# Patient Record
Sex: Female | Born: 1940 | Race: Black or African American | Hispanic: No | Marital: Single | Smoking: Current every day smoker
Health system: Southern US, Community
[De-identification: ages and names within clinical notes are randomized; demographics above are authoritative.]

## PROBLEM LIST (undated history)

## (undated) DIAGNOSIS — E785 Hyperlipidemia, unspecified: Secondary | ICD-10-CM

## (undated) DIAGNOSIS — Z91148 Patient's other noncompliance with medication regimen for other reason: Secondary | ICD-10-CM

## (undated) DIAGNOSIS — I509 Heart failure, unspecified: Secondary | ICD-10-CM

## (undated) DIAGNOSIS — J90 Pleural effusion, not elsewhere classified: Secondary | ICD-10-CM

## (undated) DIAGNOSIS — E119 Type 2 diabetes mellitus without complications: Secondary | ICD-10-CM

## (undated) DIAGNOSIS — E559 Vitamin D deficiency, unspecified: Secondary | ICD-10-CM

## (undated) DIAGNOSIS — F17200 Nicotine dependence, unspecified, uncomplicated: Secondary | ICD-10-CM

## (undated) DIAGNOSIS — H269 Unspecified cataract: Secondary | ICD-10-CM

## (undated) DIAGNOSIS — J40 Bronchitis, not specified as acute or chronic: Secondary | ICD-10-CM

## (undated) DIAGNOSIS — Z86718 Personal history of other venous thrombosis and embolism: Secondary | ICD-10-CM

## (undated) DIAGNOSIS — Z9981 Dependence on supplemental oxygen: Secondary | ICD-10-CM

## (undated) DIAGNOSIS — D649 Anemia, unspecified: Secondary | ICD-10-CM

## (undated) DIAGNOSIS — I10 Essential (primary) hypertension: Secondary | ICD-10-CM

## (undated) DIAGNOSIS — J449 Chronic obstructive pulmonary disease, unspecified: Secondary | ICD-10-CM

## (undated) DIAGNOSIS — H5440 Blindness, one eye, unspecified eye: Secondary | ICD-10-CM

## (undated) DIAGNOSIS — R296 Repeated falls: Secondary | ICD-10-CM

## (undated) DIAGNOSIS — I5032 Chronic diastolic (congestive) heart failure: Secondary | ICD-10-CM

## (undated) DIAGNOSIS — I272 Pulmonary hypertension, unspecified: Secondary | ICD-10-CM

## (undated) HISTORY — DX: Blindness, one eye, unspecified eye: H54.40

## (undated) HISTORY — PX: ENUCLEATION: SHX628

## (undated) HISTORY — DX: Heart failure, unspecified: I50.9

---

## 2006-06-05 ENCOUNTER — Ambulatory Visit (HOSPITAL_COMMUNITY): Admission: RE | Admit: 2006-06-05 | Discharge: 2006-06-05 | Payer: Self-pay | Admitting: Family Medicine

## 2006-12-01 ENCOUNTER — Emergency Department (HOSPITAL_COMMUNITY): Admission: EM | Admit: 2006-12-01 | Discharge: 2006-12-01 | Payer: Self-pay | Admitting: Emergency Medicine

## 2007-06-09 ENCOUNTER — Ambulatory Visit (HOSPITAL_COMMUNITY): Admission: RE | Admit: 2007-06-09 | Discharge: 2007-06-09 | Payer: Self-pay | Admitting: Family Medicine

## 2007-09-29 ENCOUNTER — Emergency Department (HOSPITAL_COMMUNITY): Admission: EM | Admit: 2007-09-29 | Discharge: 2007-09-30 | Payer: Self-pay | Admitting: Emergency Medicine

## 2008-06-21 ENCOUNTER — Ambulatory Visit (HOSPITAL_COMMUNITY): Admission: RE | Admit: 2008-06-21 | Discharge: 2008-06-21 | Payer: Self-pay | Admitting: Family Medicine

## 2009-06-22 ENCOUNTER — Ambulatory Visit (HOSPITAL_COMMUNITY): Admission: RE | Admit: 2009-06-22 | Discharge: 2009-06-22 | Payer: Self-pay | Admitting: Family Medicine

## 2010-05-21 ENCOUNTER — Other Ambulatory Visit (HOSPITAL_COMMUNITY): Payer: Self-pay | Admitting: Family Medicine

## 2010-05-21 DIAGNOSIS — Z139 Encounter for screening, unspecified: Secondary | ICD-10-CM

## 2010-06-25 ENCOUNTER — Ambulatory Visit (HOSPITAL_COMMUNITY)
Admission: RE | Admit: 2010-06-25 | Discharge: 2010-06-25 | Disposition: A | Discharge status: Home / Self Care | Payer: PRIVATE HEALTH INSURANCE | Source: Ambulatory Visit | Attending: Family Medicine | Admitting: Family Medicine

## 2010-06-25 DIAGNOSIS — Z1231 Encounter for screening mammogram for malignant neoplasm of breast: Secondary | ICD-10-CM | POA: Insufficient documentation

## 2010-06-25 DIAGNOSIS — Z139 Encounter for screening, unspecified: Secondary | ICD-10-CM

## 2011-05-17 ENCOUNTER — Other Ambulatory Visit (HOSPITAL_COMMUNITY): Payer: Self-pay | Admitting: Family Medicine

## 2011-05-17 DIAGNOSIS — Z139 Encounter for screening, unspecified: Secondary | ICD-10-CM

## 2011-06-27 ENCOUNTER — Ambulatory Visit (HOSPITAL_COMMUNITY)
Admission: RE | Admit: 2011-06-27 | Discharge: 2011-06-27 | Disposition: A | Discharge status: Home / Self Care | Payer: PRIVATE HEALTH INSURANCE | Source: Ambulatory Visit | Attending: Family Medicine | Admitting: Family Medicine

## 2011-06-27 DIAGNOSIS — Z139 Encounter for screening, unspecified: Secondary | ICD-10-CM

## 2011-06-27 DIAGNOSIS — Z1231 Encounter for screening mammogram for malignant neoplasm of breast: Secondary | ICD-10-CM | POA: Insufficient documentation

## 2012-05-19 ENCOUNTER — Other Ambulatory Visit (HOSPITAL_COMMUNITY): Payer: Self-pay | Admitting: Family Medicine

## 2012-05-19 DIAGNOSIS — Z139 Encounter for screening, unspecified: Secondary | ICD-10-CM

## 2012-06-29 ENCOUNTER — Ambulatory Visit (HOSPITAL_COMMUNITY)
Admission: RE | Admit: 2012-06-29 | Discharge: 2012-06-29 | Disposition: A | Discharge status: Home / Self Care | Payer: PRIVATE HEALTH INSURANCE | Source: Ambulatory Visit | Attending: Family Medicine | Admitting: Family Medicine

## 2012-06-29 DIAGNOSIS — Z139 Encounter for screening, unspecified: Secondary | ICD-10-CM

## 2012-06-29 DIAGNOSIS — Z1231 Encounter for screening mammogram for malignant neoplasm of breast: Secondary | ICD-10-CM | POA: Insufficient documentation

## 2012-06-30 ENCOUNTER — Other Ambulatory Visit: Payer: Self-pay | Admitting: Family Medicine

## 2012-06-30 DIAGNOSIS — R928 Other abnormal and inconclusive findings on diagnostic imaging of breast: Secondary | ICD-10-CM

## 2012-07-07 ENCOUNTER — Encounter (HOSPITAL_COMMUNITY): Payer: Self-pay

## 2012-07-07 ENCOUNTER — Emergency Department (HOSPITAL_COMMUNITY)
Admission: EM | Admit: 2012-07-07 | Discharge: 2012-07-07 | Disposition: A | Discharge status: Home / Self Care | Payer: PRIVATE HEALTH INSURANCE | Attending: Emergency Medicine | Admitting: Emergency Medicine

## 2012-07-07 ENCOUNTER — Emergency Department (HOSPITAL_COMMUNITY): Payer: PRIVATE HEALTH INSURANCE

## 2012-07-07 DIAGNOSIS — Y9389 Activity, other specified: Secondary | ICD-10-CM | POA: Insufficient documentation

## 2012-07-07 DIAGNOSIS — S42253A Displaced fracture of greater tuberosity of unspecified humerus, initial encounter for closed fracture: Secondary | ICD-10-CM | POA: Insufficient documentation

## 2012-07-07 DIAGNOSIS — W010XXA Fall on same level from slipping, tripping and stumbling without subsequent striking against object, initial encounter: Secondary | ICD-10-CM | POA: Insufficient documentation

## 2012-07-07 DIAGNOSIS — S42251A Displaced fracture of greater tuberosity of right humerus, initial encounter for closed fracture: Secondary | ICD-10-CM

## 2012-07-07 DIAGNOSIS — I10 Essential (primary) hypertension: Secondary | ICD-10-CM | POA: Insufficient documentation

## 2012-07-07 DIAGNOSIS — Z7982 Long term (current) use of aspirin: Secondary | ICD-10-CM | POA: Insufficient documentation

## 2012-07-07 DIAGNOSIS — Z880 Allergy status to penicillin: Secondary | ICD-10-CM | POA: Insufficient documentation

## 2012-07-07 DIAGNOSIS — Z79899 Other long term (current) drug therapy: Secondary | ICD-10-CM | POA: Insufficient documentation

## 2012-07-07 DIAGNOSIS — F17200 Nicotine dependence, unspecified, uncomplicated: Secondary | ICD-10-CM | POA: Insufficient documentation

## 2012-07-07 DIAGNOSIS — Y929 Unspecified place or not applicable: Secondary | ICD-10-CM | POA: Insufficient documentation

## 2012-07-07 HISTORY — DX: Essential (primary) hypertension: I10

## 2012-07-07 MED ORDER — TRAMADOL HCL 50 MG PO TABS: 50.0000 mg | ORAL_TABLET | Freq: Four times a day (QID) | ORAL | Status: DC | PRN

## 2012-07-07 NOTE — ED Notes (Signed)
Pt reports tripped over her ottoman at home and fell hurting r shoulder.  C/O severe pain with movement.

## 2012-07-07 NOTE — ED Provider Notes (Signed)
History    This chart was scribed for [employer]. Alvino Chapel, MD by Roxan Diesel, ED scribe.  This patient was seen in room APA18/APA18 and the patient's care was started at 4:32 PM.   CSN: [id number]  Arrival date & time 07/07/12  1558     Chief Complaint  Patient presents with  . Shoulder Pain     The history is provided by the patient. No language interpreter was used.    HPI Comments: Marisa Bailey is a 72 y.o. female who presents to the Emergency Department complaining of a fall that occurred several hours ago, with subsequent right shoulder pain. Pt states that she tripped over her ottoman and landed on her right arm.  She denies head impact or LOC.  She states that pain is greatly exacerbated by movement. She denies injury or pain to any other areas.  She states she is not on blood-thinners.   Past Medical History  Diagnosis Date  . Hypertension     History reviewed. No pertinent past surgical history.  No family history on file.  History  Substance Use Topics  . Smoking status: Current Every Day Smoker  . Smokeless tobacco: Not on file  . Alcohol Use: No    OB History   Grav Para Term Preterm Abortions TAB SAB Ect Mult Living                  Review of Systems  Constitutional: Negative for fever and chills.  Respiratory: Negative for cough and shortness of breath.   Cardiovascular: Negative for chest pain.  Gastrointestinal: Negative for nausea, vomiting, abdominal pain and diarrhea.  Genitourinary: Negative for dysuria, frequency, hematuria and difficulty urinating.  Skin: Negative for rash.  Neurological: Negative for dizziness, weakness, light-headedness and numbness.  Psychiatric/Behavioral: Negative for behavioral problems and confusion.    Allergies  Penicillins  Home Medications   Current Outpatient Rx  Name  Route  Sig  Dispense  Refill  . amLODipine (NORVASC) 10 MG tablet   Oral   Take 10 mg by mouth daily.         Marland Kitchen aspirin EC 81  MG tablet   Oral   Take 81 mg by mouth daily.         Marland Kitchen olmesartan (BENICAR) 40 MG tablet   Oral   Take 40 mg by mouth daily.         . traMADol (ULTRAM) 50 MG tablet   Oral   Take 1 tablet (50 mg total) by mouth every 6 (six) hours as needed for pain.   15 tablet   0     BP 148/59  Pulse 68  Temp(Src) 98.4 F (36.9 C) (Oral)  Resp 20  Ht _0  (1.6 m)  Wt 168 lb (76.204 kg)  BMI 29.77 kg/m2  SpO2 99%  Physical Exam  Nursing note and vitals reviewed. Constitutional: She appears well-developed and well-nourished. No distress.  HENT:  Head: Normocephalic and atraumatic.  Eyes: Conjunctivae are normal.  Neck: Normal range of motion. Neck supple.  Cardiovascular: Normal rate and regular rhythm.   No murmur heard. Pulmonary/Chest: Effort normal and breath sounds normal. No respiratory distress. She has no wheezes. She has no rales.  Musculoskeletal: She exhibits tenderness.  Discrete decreased ROM to right shoulder Tendernses laterally to right shoulder Sensation intact to radial, ulnar and median nerve Strong radial pulse  Neurological: She is alert. Coordination normal.  Skin: Skin is warm and dry. No rash noted.  Psychiatric: She has a normal mood and affect. Her behavior is normal.    ED Course  Procedures (including critical care time)   COORDINATION OF CARE: 4:35 PM-Discussed treatment plan which includes imaging with pt at bedside and pt agreed to plan.   No results found for this or any previous visit. Dg Shoulder Right  07/07/2012   *RADIOLOGY REPORT*  Clinical Data: Right shoulder pain  RIGHT SHOULDER - 2+ VIEW  Comparison: None.  Findings: Nondisplaced fracture of the greater tuberosity.  Mild degenerative changes of the acromioclavicular joint.  Visualized right lung is clear.  IMPRESSION: Nondisplaced fracture of the greater tuberosity.   Original Report Authenticated By: Julian Hy, M.D.   Dg Humerus Right  07/07/2012   *RADIOLOGY REPORT*   Clinical Data: Fall, arm pain  RIGHT HUMERUS - 2+ VIEW  Comparison: None.  Findings: Nondisplaced greater tuberosity fracture, better evaluated on shoulder radiographs.  No distal humeral fracture is seen.  Mild degenerative changes of the elbow.  No displaced elbow joint fat pads to suggest an elbow joint effusion.  IMPRESSION: Nondisplaced greater tuberosity fracture, better evaluated on shoulder radiographs.  No distal humeral fracture is seen.   Original Report Authenticated By: Julian Hy, M.D.   Mm Digital Screening  06/30/2012   *RADIOLOGY REPORT*  Clinical Data: Screening.  DIGITAL BILATERAL SCREENING MAMMOGRAM WITH CAD  Comparison:  06/21/2008  FINDINGS:  ACR Breast Density Category 1: The breast tissue is almost entirely fatty.  There is a possible mass in the left breast. Spot compression views and possibly sonography are recommended for further evaluation. The right breast is unremarkable.  Images were processed with CAD.  IMPRESSION: Possible mass, left breast.  Additional evaluation is indicated.  RECOMMENDATION: Diagnostic mammogram and possibly sonography of the left breast. (Code:FI-L-16M)  BI-RADS CATEGORY 0:  Incomplete.  Need additional imaging evaluation and/or prior mammograms for comparison.   Original Report Authenticated By: Ulyess Blossom, M.D.        1. Greater tuberosity of humerus fracture, right, closed, initial encounter       MDM  Patient with fall. Fracture of greater tuberosity of right humerus. Discussed with Dr. Luna Glasgow. Will see in office tomorrow.      I personally performed the services described in this documentation, which was scribed in my presence. The recorded information has been reviewed and is accurate.     Jasper Riling. Alvino Chapel, MD 07/07/12 [phone]

## 2012-07-15 ENCOUNTER — Ambulatory Visit (HOSPITAL_COMMUNITY)
Admission: RE | Admit: 2012-07-15 | Discharge: 2012-07-15 | Disposition: A | Discharge status: Home / Self Care | Payer: PRIVATE HEALTH INSURANCE | Source: Ambulatory Visit | Attending: Family Medicine | Admitting: Family Medicine

## 2012-07-15 ENCOUNTER — Other Ambulatory Visit: Payer: Self-pay | Admitting: Family Medicine

## 2012-07-15 DIAGNOSIS — R928 Other abnormal and inconclusive findings on diagnostic imaging of breast: Secondary | ICD-10-CM

## 2012-08-17 ENCOUNTER — Ambulatory Visit (HOSPITAL_COMMUNITY)
Admission: RE | Admit: 2012-08-17 | Discharge: 2012-08-17 | Disposition: A | Discharge status: Home / Self Care | Payer: PRIVATE HEALTH INSURANCE | Source: Ambulatory Visit | Attending: Orthopaedic Surgery | Admitting: Orthopaedic Surgery

## 2012-08-17 DIAGNOSIS — S42209A Unspecified fracture of upper end of unspecified humerus, initial encounter for closed fracture: Secondary | ICD-10-CM

## 2012-08-17 DIAGNOSIS — I10 Essential (primary) hypertension: Secondary | ICD-10-CM | POA: Insufficient documentation

## 2012-08-17 DIAGNOSIS — M6281 Muscle weakness (generalized): Secondary | ICD-10-CM | POA: Insufficient documentation

## 2012-08-17 DIAGNOSIS — M25519 Pain in unspecified shoulder: Secondary | ICD-10-CM | POA: Insufficient documentation

## 2012-08-17 DIAGNOSIS — IMO0001 Reserved for inherently not codable concepts without codable children: Secondary | ICD-10-CM | POA: Insufficient documentation

## 2012-08-17 HISTORY — DX: Unspecified fracture of upper end of unspecified humerus, initial encounter for closed fracture: S42.209A

## 2012-08-17 NOTE — Evaluation (Addendum)
Occupational Therapy Evaluation  Patient Details  Name: Marisa Bailey MRN: [id number] Date of Birth: 1940/03/05  Today's Date: 08/17/2012 Time: 0998-3382 OT Time Calculation (min): 42 min OT eval 1103-1145 42'  Visit#: 1 of 12  Re-eval: 09/14/12  Assessment Diagnosis: Rt. proximal humeral fx Next MD Visit: 3 weeks - [place]  Authorization: Medicaid  Authorization Time Period:    Authorization Visit#: 1 of     Past Medical History:  Past Medical History  Diagnosis Date  . Hypertension    Past Surgical History: No past surgical history on file.  Subjective Symptoms/Limitations Symptoms: S: I was trying to get a stink bug off the floor and I tripped and fell.  Pertinent History: Marisa Bailey fell at home on July 19, 2012 and sustained a right proximal humerus fx. Dr. Luna Glasgow has referred patient to occupational therapy for evaluation and treatment.  Special Tests: UEFI score 63/80 78.75% independence 21.25% impaired Patient Stated Goals: To get rid of the pain and to use right arm as normal. Pain Assessment Currently in Pain?: Yes Pain Score: 7  Pain Location: Shoulder Pain Orientation: Right Pain Type: Acute pain Pain Relieving Factors: pain meds  Precautions/Restrictions  Precautions Precautions: Shoulder  Balance Screening Balance Screen Has the patient fallen in the past 6 months: Yes How many times?: 1 Has the patient had a decrease in activity level because of a fear of falling? : No Is the patient reluctant to leave their home because of a fear of falling? : No  Prior Functioning  Home Living Family/patient expects to be discharged to:: Private residence Living Arrangements: Alone Prior Function Level of Independence: Independent with basic ADLs;Independent with gait Driving: Yes Vocation: Retired Leisure: Hobbies-yes (Comment) Comments: travel, read, watch tv  Assessment ADL/Vision/Perception ADL ADL Comments: reaching up high, getting  shirt on/off,  housework, Dominant Hand: Right  Cognition/Observation Cognition Overall Cognitive Status: Within Functional Limits for tasks assessed Arousal/Alertness: Awake/alert Orientation Level: Oriented X4   Additional Assessments RUE Assessment RUE Assessment:  (assessed supine. IR/ER ABD) RUE AROM (degrees) Right Shoulder Flexion: 110 Degrees Right Shoulder ABduction: 99 Degrees Right Shoulder Internal Rotation: 81 Degrees Right Shoulder External Rotation: 65 Degrees RUE Strength Right Shoulder Flexion: 2+/5 Right Shoulder ABduction: 2+/5 Right Shoulder Internal Rotation: 3/5 Right Shoulder External Rotation: 2+/5 Palpation Palpation: min fascial restrictions in upper arm, trapezius, and scapularis.      Occupational Therapy Assessment and Plan OT Assessment and Plan Clinical Impression Statement: A: 72 y/o woman presents with R proximal humerus fracture secondary to a fall. Patient has decreased A/PROM and strength and increased pain and fascial restrictions causing decreased I with all B/IADLs and leisure activities Pt will benefit from skilled therapeutic intervention in order to improve on the following deficits: Decreased strength;Pain;Impaired UE functional use;Decreased range of motion;Increased fascial restricitons Rehab Potential: Excellent OT Frequency: Min 2X/week OT Duration: 8 weeks OT Treatment/Interventions: Self-care/ADL training;Therapeutic exercise;Manual therapy;Patient/family education;Therapeutic activities;Modalities OT Plan: P: Skilled OT intervention 2x per week to decrease pain and restrictions and increase A/PROM and strength needed to return to PLOF. MFR and manual stretching; supine PROM; ball stretches; seated elev,ext,row; prot/ret/elev As PROM improves, add AAROM in supine and seated positions with dowel and pulleys    Goals Short Term Goals Time to Complete Short Term Goals: 4 weeks Short Term Goal 1: Patient will be educated on HEP. Short Term Goal  2: Patient will improve R shoulder PROM to [facility] in order to increase independence with dressing.  Short Term Goal 3: Patient will  decrease pain to 4/10 when performing household tasks.  Short Term Goal 4: Patient will increase R shoulder strength to 3/5 in order to increase ease with donning shirt. Short Term Goal 5: Patient will decrease fascial restrictions from minimal to trace in right shoulder and upper arm regions.  Long Term Goals Time to Complete Long Term Goals: 8 weeks Long Term Goal 1: Patient will return to higher level of independence with all leisure and daily tasks. Long Term Goal 2: Patient will improve R shoulder AROM to WNL to increase independence with reaching in overhead kitchen cabinets.  Long Term Goal 3: Patient will decrease pain to 2/10 when performing household tasks.  Long Term Goal 4: Patient will improve R shoulder strength to 4/5 in order to increase independence with household chores such as vacuuming and mopping.   Problem List Patient Active Problem List   Diagnosis Date Noted  . Muscle weakness (generalized) 08/17/2012  . Pain in joint, shoulder region 08/17/2012  . Fx upper humerus-closed 08/17/2012    End of Session Activity Tolerance: Patient tolerated treatment well General Behavior During Therapy: [facility] for tasks assessed/performed OT Plan of Care OT Home Exercise Plan: towel slides OT Patient Instructions: handout - scanned Consulted and Agree with Plan of Care: Patient    28-Aug-2012 1337  OT G-codes  Functional Assessment Tool Used UEFI score 63/80 78.75% independence 21.25% impaired  Functional Limitation Carrying, moving and handling objects  Carrying, Moving and Handling Objects Current Status (E2800) CJ  Carrying, Moving and Handling Objects Goal Status (L4917) [place], OTR/L,CBIS   08/17/2012, 12:16 PM  Physician Documentation Your signature is required to indicate approval of the treatment plan as stated above.  Please  sign and either send electronically or make a copy of this report for your files and return this physician signed original.  Please mark one 1.__approve of plan  2. ___approve of plan with the following conditions.   ______________________________                                                          _____________________ Physician Signature                                                                                                             Date

## 2012-08-20 ENCOUNTER — Ambulatory Visit (HOSPITAL_COMMUNITY)
Admission: RE | Admit: 2012-08-20 | Discharge: 2012-08-20 | Disposition: A | Discharge status: Home / Self Care | Payer: PRIVATE HEALTH INSURANCE | Source: Ambulatory Visit | Attending: Orthopaedic Surgery | Admitting: Orthopaedic Surgery

## 2012-08-20 NOTE — Progress Notes (Signed)
Occupational Therapy Treatment Patient Details  Name: Marisa Bailey MRN: [id number] Date of Birth: 1940/12/10  Today's Date: 08/20/2012 Time: 0930-1005 OT Time Calculation (min): 35 min MFR 930-947 17' Therex [phone] 18'  Visit#: 2 of 12  Re-eval: 09/14/12    Authorization: Medicaid  Authorization Time Period:    Authorization Visit#: 2 of    Subjective Symptoms/Limitations Symptoms: S: I did the exercises you gave me this morning.  Pain Assessment Currently in Pain?: No/denies  Precautions/Restrictions  Precautions Precautions: Shoulder  Exercise/Treatments Supine Protraction: PROM;10 reps Horizontal ABduction: PROM;10 reps External Rotation: PROM;10 reps Internal Rotation: PROM;10 reps Flexion: PROM;10 reps ABduction: PROM;10 reps Seated Elevation: AROM;10 reps Extension: AROM;10 reps Row: AROM;10 reps Therapy Ball Flexion: 10 reps ABduction: 10 reps    Manual Therapy Manual Therapy: Myofascial release Myofascial Release: MFR and manual stretching to right upper arm, trapezius, and scapularis to decrease fascial restrictions and increase joint mobilty in a pain free zone.   Occupational Therapy Assessment and Plan OT Assessment and Plan Clinical Impression Statement: A: Patient informed that Medicaid would not cover the remaining amount from therapy. patient was given Inez Catalina Ratliff's number. Patient tolerated all exercises. Slight pain at end stretch during manual stretching. OT Plan: P: Increase reps of therapy ball. Work on increasing PROM to [facility] without pain.   Goals Short Term Goals Time to Complete Short Term Goals: 4 weeks Short Term Goal 1: Patient will be educated on HEP. Short Term Goal 1 Progress: Progressing toward goal Short Term Goal 2: Patient will improve R shoulder PROM to [facility] in order to increase independence with dressing.  Short Term Goal 2 Progress: Progressing toward goal Short Term Goal 3: Patient will decrease pain to 4/10 when  performing household tasks.  Short Term Goal 3 Progress: Progressing toward goal Short Term Goal 4: Patient will increase R shoulder strength to 3/5 in order to increase ease with donning shirt. Short Term Goal 4 Progress: Progressing toward goal Short Term Goal 5: Patient will decrease fascial restrictions from minimal to trace in right shoulder and upper arm regions.  Short Term Goal 5 Progress: Progressing toward goal Long Term Goals Time to Complete Long Term Goals: 8 weeks Long Term Goal 1: Patient will return to higher level of independence with all leisure and daily tasks. Long Term Goal 1 Progress: Progressing toward goal Long Term Goal 2: Patient will improve R shoulder AROM to WNL to increase independence with reaching in overhead kitchen cabinets.  Long Term Goal 2 Progress: Progressing toward goal Long Term Goal 3: Patient will decrease pain to 2/10 when performing household tasks.  Long Term Goal 3 Progress: Progressing toward goal Long Term Goal 4: Patient will improve R shoulder strength to 4/5 in order to increase independence with household chores such as vacuuming and mopping.  Long Term Goal 4 Progress: Progressing toward goal  Problem List Patient Active Problem List   Diagnosis Date Noted  . Muscle weakness (generalized) 08/17/2012  . Pain in joint, shoulder region 08/17/2012  . Fx upper humerus-closed 08/17/2012    End of Session Activity Tolerance: Patient tolerated treatment well General Behavior During Therapy: [facility] for tasks assessed/performed   Ailene Ravel, OTR/L,CBIS   08/20/2012, 10:11 AM

## 2012-08-25 ENCOUNTER — Ambulatory Visit (HOSPITAL_COMMUNITY)
Admission: RE | Admit: 2012-08-25 | Discharge: 2012-08-25 | Disposition: A | Discharge status: Home / Self Care | Payer: PRIVATE HEALTH INSURANCE | Source: Ambulatory Visit | Attending: Family Medicine | Admitting: Family Medicine

## 2012-08-25 DIAGNOSIS — S42201D Unspecified fracture of upper end of right humerus, subsequent encounter for fracture with routine healing: Secondary | ICD-10-CM

## 2012-08-25 DIAGNOSIS — M6281 Muscle weakness (generalized): Secondary | ICD-10-CM

## 2012-08-25 DIAGNOSIS — M25511 Pain in right shoulder: Secondary | ICD-10-CM

## 2012-08-25 NOTE — Progress Notes (Signed)
Occupational Therapy Treatment Patient Details  Name: Marisa Bailey MRN: [id number] Date of Birth: [birth year]  Today's Date: 08/25/2012 Time: 7829-5621 OT Time Calculation (min): 32 min Manual Therapy [phone] 13' Therapeutic exercises 846-962 19' Visit#: 3 of 12  Re-eval: 09/14/12    Authorization: [employer] Medicare Primary, Medicaid secondary, Medicaid covers eval only, patient notified of this and signed self payment form.   Authorization Time Period: before 10th visit  Authorization Visit#: 3 of 10  Subjective Symptoms/Limitations Symptoms: S:  What can I put on it when it hurts?"  Recommended heat, or rub with icey hot.  Limitations: Progress as tolerated, AROM ok after 08/31/12 Pain Assessment Currently in Pain?: No/denies Pain Score: 0-No pain  Precautions/Restrictions     Exercise/Treatments Supine Protraction: PROM;10 reps Horizontal ABduction: PROM;10 reps External Rotation: PROM;10 reps Internal Rotation: PROM;10 reps Flexion: PROM;10 reps ABduction: PROM;10 reps Seated Elevation: AROM;15 reps Extension: AROM;15 reps Row: AROM;15 reps Pulleys Flexion: 1 minute ABduction: 1 minute Therapy Ball Flexion: 20 reps ABduction: 20 reps Isometric Strengthening Thumb Tacks: 1' Prot/Ret//Elev/Dep: 1' Flexion: Supine;3X3" Extension: Supine;3X3" External Rotation: Supine;3X3" Internal Rotation: Supine;3X3" ABduction: Supine;3X3" ADduction: Supine;3X3"     Manual Therapy Manual Therapy: Myofascial release Myofascial Release: MFR and manual stretching to right shoulder region, upper arm, trapezius, and scapularis to decrease fascial restrictions and increase joint mobilty in a pain free zone.   Occupational Therapy Assessment and Plan OT Assessment and Plan Clinical Impression Statement: A:  PROM to approximately 75% flexion and ER, abduction limited to 50%.  Added scapular stabilization exercises (prot/ret//elev/dep and thumbtacks), and pulleys for increased  PROM. OT Plan: P:  Add dowel rod exercises for flexion, prot, horizontal abd, and ER/IR.    Goals Short Term Goals Time to Complete Short Term Goals: 4 weeks Short Term Goal 1: Patient will be educated on HEP. Short Term Goal 1 Progress: Progressing toward goal Short Term Goal 2: Patient will improve R shoulder PROM to [facility] in order to increase independence with dressing.  Short Term Goal 2 Progress: Progressing toward goal Short Term Goal 3: Patient will decrease pain to 4/10 when performing household tasks.  Short Term Goal 3 Progress: Progressing toward goal Short Term Goal 4: Patient will increase R shoulder strength to 3/5 in order to increase ease with donning shirt. Short Term Goal 4 Progress: Progressing toward goal Short Term Goal 5: Patient will decrease fascial restrictions from minimal to trace in right shoulder and upper arm regions.  Short Term Goal 5 Progress: Progressing toward goal Long Term Goals Time to Complete Long Term Goals: 8 weeks Long Term Goal 1: Patient will return to higher level of independence with all leisure and daily tasks. Long Term Goal 1 Progress: Progressing toward goal Long Term Goal 2: Patient will improve R shoulder AROM to WNL to increase independence with reaching in overhead kitchen cabinets.  Long Term Goal 2 Progress: Progressing toward goal Long Term Goal 3: Patient will decrease pain to 2/10 when performing household tasks.  Long Term Goal 3 Progress: Progressing toward goal Long Term Goal 4: Patient will improve R shoulder strength to 4/5 in order to increase independence with household chores such as vacuuming and mopping.  Long Term Goal 4 Progress: Progressing toward goal  Problem List Patient Active Problem List   Diagnosis Date Noted  . Muscle weakness (generalized) 08/17/2012  . Pain in joint, shoulder region 08/17/2012  . Fx upper humerus-closed 08/17/2012    End of Session Activity Tolerance: Patient tolerated treatment  well  General Behavior During Therapy: [facility] for tasks assessed/performed  Vangie Bicker, OTR/L  08/25/2012, 9:35 AM

## 2012-08-27 ENCOUNTER — Ambulatory Visit (HOSPITAL_COMMUNITY): Payer: PRIVATE HEALTH INSURANCE | Admitting: Specialist

## 2012-09-01 ENCOUNTER — Ambulatory Visit (HOSPITAL_COMMUNITY): Payer: PRIVATE HEALTH INSURANCE

## 2012-09-03 ENCOUNTER — Ambulatory Visit (HOSPITAL_COMMUNITY)
Admission: RE | Admit: 2012-09-03 | Discharge: 2012-09-03 | Disposition: A | Discharge status: Home / Self Care | Payer: PRIVATE HEALTH INSURANCE | Source: Ambulatory Visit | Attending: Family Medicine | Admitting: Family Medicine

## 2012-09-03 NOTE — Progress Notes (Signed)
Occupational Therapy Treatment Patient Details  Name: Marisa Bailey MRN: [id number] Date of Birth: 1940-05-02  Today's Date: 09/03/2012 Time: 1448-1856 OT Time Calculation (min): 42 min Manual Therapy [phone] 16' Therapeutic Exercise 904-930 26' Visit#: 4 of 12  Re-eval: 09/14/12    Authorization: [employer] Medicare Primary, Medicaid secondary, Medicaid covers eval only, patient notified of this and signed self payment form.   Authorization Time Period: before 10th visit  Authorization Visit#: 4 of 10  Subjective  S:  The fan blowing on it makes it hurt.   Limitations: Progress as tolerated, AROM ok after 08/31/12 Pain Assessment Currently in Pain?: Yes Pain Score: 3  Pain Location: Shoulder Pain Orientation: Right Pain Type: Acute pain  Precautions/Restrictions   progress as tolerated  Exercise/Treatments Supine Protraction: PROM;AAROM;10 reps Horizontal ABduction: PROM;AAROM;10 reps External Rotation: PROM;AAROM;10 reps Internal Rotation: PROM;AAROM;10 reps Flexion: PROM;AAROM;10 reps ABduction: PROM;AAROM;10 reps Seated Elevation: AROM;15 reps Extension: AROM;15 reps Retraction: AROM;15 reps Row: AROM;15 reps Pulleys Flexion:  (omit this visit) ABduction:  (omit this visit) Therapy Ball Flexion: 25 reps ABduction: 25 reps Right/Left: 5 reps ROM / Strengthening / Isometric Strengthening Wall Wash: 1' Thumb Tacks: 1' Prot/Ret//Elev/Dep: 1' Flexion:  (dc isometric strengthening)    Manual Therapy Manual Therapy: Myofascial release Myofascial Release: MFR and manual stretching to right shoulder region, upper arm, trapezius, and scapularis to decrease fascial restrictions and increase joint mobilty in a pain free zone.  Occupational Therapy Assessment and Plan OT Assessment and Plan Clinical Impression Statement: A:  Added AAROM exercises in supine, as PROM was [facility].  Added ball circles and wall wash exercises.  OT Plan: P: Increase repetitions with dowel rod  exercises and add dowel rod exercises in seated.    Goals Short Term Goals Time to Complete Short Term Goals: 4 weeks Short Term Goal 1: Patient will be educated on HEP. Short Term Goal 1 Progress: Progressing toward goal Short Term Goal 2: Patient will improve R shoulder PROM to [facility] in order to increase independence with dressing.  Short Term Goal 2 Progress: Progressing toward goal Short Term Goal 3: Patient will decrease pain to 4/10 when performing household tasks.  Short Term Goal 3 Progress: Progressing toward goal Short Term Goal 4: Patient will increase R shoulder strength to 3/5 in order to increase ease with donning shirt. Short Term Goal 4 Progress: Progressing toward goal Short Term Goal 5: Patient will decrease fascial restrictions from minimal to trace in right shoulder and upper arm regions.  Short Term Goal 5 Progress: Progressing toward goal Long Term Goals Time to Complete Long Term Goals: 8 weeks Long Term Goal 1: Patient will return to higher level of independence with all leisure and daily tasks. Long Term Goal 1 Progress: Progressing toward goal Long Term Goal 2: Patient will improve R shoulder AROM to WNL to increase independence with reaching in overhead kitchen cabinets.  Long Term Goal 2 Progress: Progressing toward goal Long Term Goal 3: Patient will decrease pain to 2/10 when performing household tasks.  Long Term Goal 3 Progress: Progressing toward goal Long Term Goal 4: Patient will improve R shoulder strength to 4/5 in order to increase independence with household chores such as vacuuming and mopping.  Long Term Goal 4 Progress: Progressing toward goal  Problem List Patient Active Problem List   Diagnosis Date Noted  . Muscle weakness (generalized) 08/17/2012  . Pain in joint, shoulder region 08/17/2012  . Fx upper humerus-closed 08/17/2012    End of Session Activity Tolerance: Patient  tolerated treatment well General Behavior During Therapy: [facility]  for tasks assessed/performed  [place], OTR/L  09/03/2012, 9:35 AM

## 2012-09-08 ENCOUNTER — Ambulatory Visit (HOSPITAL_COMMUNITY): Payer: PRIVATE HEALTH INSURANCE

## 2012-09-10 ENCOUNTER — Ambulatory Visit (HOSPITAL_COMMUNITY): Payer: PRIVATE HEALTH INSURANCE

## 2012-09-15 ENCOUNTER — Ambulatory Visit (HOSPITAL_COMMUNITY)
Admission: RE | Admit: 2012-09-15 | Discharge: 2012-09-15 | Disposition: A | Discharge status: Home / Self Care | Payer: PRIVATE HEALTH INSURANCE | Source: Ambulatory Visit | Attending: Orthopaedic Surgery | Admitting: Orthopaedic Surgery

## 2012-09-15 DIAGNOSIS — M25519 Pain in unspecified shoulder: Secondary | ICD-10-CM | POA: Insufficient documentation

## 2012-09-15 DIAGNOSIS — M6281 Muscle weakness (generalized): Secondary | ICD-10-CM | POA: Insufficient documentation

## 2012-09-15 DIAGNOSIS — I10 Essential (primary) hypertension: Secondary | ICD-10-CM | POA: Insufficient documentation

## 2012-09-15 DIAGNOSIS — IMO0001 Reserved for inherently not codable concepts without codable children: Secondary | ICD-10-CM | POA: Insufficient documentation

## 2012-09-15 NOTE — Progress Notes (Signed)
Occupational Therapy Treatment Patient Details  Name: Marisa Bailey MRN: [id number] Date of Birth: [birth year]  Today's Date: 09/15/2012 Time: 9201-0071 OT Time Calculation (min): 45 min Manual Therapy [phone] 22' Therapeutic exercises 905-928 23' ROM assessment  Visit#: 5 of 12  Re-eval: 10/13/12    Authorization: UHC Medicare Primary, Medicaid secondary, Medicaid covers eval only, patient notified of this and signed self payment form.   Authorization Time Period: before15th visit  Authorization Visit#: 5 of 15  Subjective S:  The icy hot makes it feel better. Limitations: Progress as tolerated, AROM ok after 08/31/12 Special Tests: UEFI score is 96% Independence level, was 78% at initial evaluation.  Pain Assessment Currently in Pain?: Yes Pain Score: 6  Pain Location: Shoulder Pain Orientation: Right Pain Type: Acute pain  Precautions/Restrictions    Progress as tolerated, AROM ok after 08/31/12  Exercise/Treatments Supine Protraction: PROM;10 reps;AAROM;12 reps Horizontal ABduction: PROM;10 reps;AAROM;12 reps External Rotation: PROM;10 reps;AAROM;12 reps Internal Rotation: PROM;10 reps;AAROM;12 reps Flexion: PROM;10 reps;AAROM;12 reps ABduction: PROM;10 reps;AAROM;12 reps Seated Elevation: AROM;15 reps Extension: AROM;15 reps Retraction: AROM;15 reps Row: AROM;15 reps Therapy Ball Flexion: 25 reps ABduction: 25 reps Right/Left: 5 reps ROM / Strengthening / Isometric Strengthening Wall Wash: 2' Thumb Tacks: 1' Prot/Ret//Elev/Dep: 1'      Manual Therapy Manual Therapy: Myofascial release Myofascial Release: MFR and manual stretching to right shoulder region, upper arm, trapezius, and scapularis to decrease fascial restrictions and increase joint mobilty in a pain free zone.  Occupational Therapy Assessment and Plan OT Assessment and Plan Clinical Impression Statement: A: Reassessment completed this date: supine A/PROM current (initial evaluation) flexion  155/165 (110), abduction 140/170 (99), external rotation with shoulder adducted 35/60 (65), internal rotation with shoulder adducted 80 (81). Strength assessed in seated and is 3+/5 throughout shoulder.  OT Frequency: Min 2X/week OT Duration: 4 weeks OT Plan: P:  Add AAROM in seated and theraband exercises for scapular stability.     Goals Short Term Goals Time to Complete Short Term Goals: 4 weeks Short Term Goal 1: Patient will be educated on HEP. Short Term Goal 1 Progress: Met Short Term Goal 2: Patient will improve R shoulder PROM to [facility] in order to increase independence with dressing.  Short Term Goal 2 Progress: Met Short Term Goal 3: Patient will decrease pain to 4/10 when performing household tasks.  Short Term Goal 3 Progress: Progressing toward goal Short Term Goal 4: Patient will increase R shoulder strength to 3/5 in order to increase ease with donning shirt. Short Term Goal 4 Progress: Met Short Term Goal 5: Patient will decrease fascial restrictions from minimal to trace in right shoulder and upper arm regions.  Short Term Goal 5 Progress: Progressing toward goal Long Term Goals Time to Complete Long Term Goals: 8 weeks Long Term Goal 1: Patient will return to higher level of independence with all leisure and daily tasks. Long Term Goal 1 Progress: Progressing toward goal Long Term Goal 2: Patient will improve R shoulder AROM to WNL to increase independence with reaching in overhead kitchen cabinets.  Long Term Goal 2 Progress: Progressing toward goal Long Term Goal 3: Patient will decrease pain to 2/10 when performing household tasks.  Long Term Goal 3 Progress: Progressing toward goal Long Term Goal 4: Patient will improve R shoulder strength to 4/5 in order to increase independence with household chores such as vacuuming and mopping.  Long Term Goal 4 Progress: Progressing toward goal  Problem List Patient Active Problem List   Diagnosis Date  Noted  . Muscle weakness  (generalized) 08/17/2012  . Pain in joint, shoulder region 08/17/2012  . Fx upper humerus-closed 08/17/2012    End of Session Activity Tolerance: Patient tolerated treatment well General Behavior During Therapy: [facility] for tasks assessed/performed  GO Functional Assessment Tool Used: UEFI was 78 and is currently  Functional Limitation: Carrying, moving and handling objects Carrying, Moving and Handling Objects Current Status ([id number]): At least 1 percent but less than 20 percent impaired, limited or restricted Carrying, Moving and Handling Objects Goal Status [phone]): At least 60 percent but less than 80 percent impaired, limited or restricted  Vangie Bicker, OTR/L  09/15/2012, 9:29 AM

## 2012-09-18 ENCOUNTER — Ambulatory Visit (HOSPITAL_COMMUNITY)
Admission: RE | Admit: 2012-09-18 | Discharge: 2012-09-18 | Disposition: A | Discharge status: Home / Self Care | Payer: PRIVATE HEALTH INSURANCE | Source: Ambulatory Visit | Attending: Orthopaedic Surgery | Admitting: Orthopaedic Surgery

## 2012-09-18 NOTE — Progress Notes (Signed)
Occupational Therapy Treatment Patient Details  Name: Marisa Bailey MRN: [id number] Date of Birth: Apr 10, 1940  Today's Date: 09/18/2012 Time: 1110-1140 OT Time Calculation (min): 30 min Manual Therapy 1110-1131 21' Therapeutic Exercises 1131-1140 9' Visit#: 6 of 12  Re-eval: 10/13/12    Authorization: UHC Medicare Primary, Medicaid secondary, Medicaid covers eval only, patient notified of this and signed self payment form.   Authorization Time Period: before 15th visit  Authorization Visit#: 6 of 15  Subjective S:  I have only one sore spot today. Limitations: Progress as tolerated, AROM ok after 08/31/12 Pain Assessment Currently in Pain?: Yes Pain Score: 7  Pain Location: Shoulder Pain Orientation: Right Pain Type: Acute pain  Precautions/Restrictions    Progress as tolerated, AROM ok after 08/31/12   Exercise/Treatments Supine Protraction: PROM;10 reps;AAROM;15 reps Horizontal ABduction: PROM;10 reps;AAROM;15 reps External Rotation: PROM;10 reps;AAROM;15 reps Internal Rotation: PROM;10 reps;AAROM;15 reps Flexion: PROM;10 reps;AAROM;15 reps ABduction: PROM;10 reps;AAROM;15 reps Seated Protraction: AAROM;10 reps Horizontal ABduction: AAROM;10 reps External Rotation: AAROM;10 reps Internal Rotation: AAROM;10 reps Flexion: AAROM;10 reps Abduction: AAROM;10 reps    Manual Therapy Manual Therapy: Myofascial release Myofascial Release: MFR and manual stretching to right shoulder region, upper arm, trapezius, and scapularis to decrease fascial restrictions and increase joint mobilty in a pain free zone.  Occupational Therapy Assessment and Plan OT Assessment and Plan Clinical Impression Statement: A:  Did not complete majority of therapeutic exercises, as patient requested to leave session early for another engagement.  Added AAROM in seated, required min pa and mod vg for technique with all seated AAROM.  OT Plan: P:  Resume all therapeutic exercises, complete AAROM in  seated with less vg and physical cuing.    Goals Short Term Goals Time to Complete Short Term Goals: 4 weeks Short Term Goal 1: Patient will be educated on HEP. Short Term Goal 2: Patient will improve R shoulder PROM to [facility] in order to increase independence with dressing.  Short Term Goal 3: Patient will decrease pain to 4/10 when performing household tasks.  Short Term Goal 4: Patient will increase R shoulder strength to 3/5 in order to increase ease with donning shirt. Short Term Goal 5: Patient will decrease fascial restrictions from minimal to trace in right shoulder and upper arm regions.  Long Term Goals Time to Complete Long Term Goals: 8 weeks Long Term Goal 1: Patient will return to higher level of independence with all leisure and daily tasks. Long Term Goal 2: Patient will improve R shoulder AROM to WNL to increase independence with reaching in overhead kitchen cabinets.  Long Term Goal 3: Patient will decrease pain to 2/10 when performing household tasks.  Long Term Goal 4: Patient will improve R shoulder strength to 4/5 in order to increase independence with household chores such as vacuuming and mopping.   Problem List Patient Active Problem List   Diagnosis Date Noted  . Muscle weakness (generalized) 08/17/2012  . Pain in joint, shoulder region 08/17/2012  . Fx upper humerus-closed 08/17/2012    End of Session Activity Tolerance: Patient tolerated treatment well General Behavior During Therapy: WFL for tasks assessed/performed OT Plan of Care OT Home Exercise Plan: educated on dowel rod exercises in supine/seated  [place], OTR/L  09/18/2012, 11:45 AM

## 2012-09-22 ENCOUNTER — Ambulatory Visit (HOSPITAL_COMMUNITY)
Admission: RE | Admit: 2012-09-22 | Discharge: 2012-09-22 | Disposition: A | Discharge status: Home / Self Care | Payer: PRIVATE HEALTH INSURANCE | Source: Ambulatory Visit | Attending: Family Medicine | Admitting: Family Medicine

## 2012-09-22 NOTE — Progress Notes (Signed)
Occupational Therapy Treatment Patient Details  Name: Marisa Bailey MRN: [id number] Date of Birth: January 24, 1941  Today's Date: 09/22/2012 Time: 3702-3017 OT Time Calculation (min): 42 min Manual Therapy 849-901 12' Therapeutic Exercises 901-931 30' Visit#: 7 of 12  Re-eval: 10/13/12    Authorization: UHC Medicare Primary, Medicaid secondary, Medicaid covers eval only, patient notified of this and signed self payment form.   Authorization Time Period: before 15th visit  Authorization Visit#: 7 of 15  Subjective  S:  I have one little spot that is sore, in the back of my shoulder.  Pain Assessment Currently in Pain?: Yes Pain Score: 6  Pain Location: Shoulder Pain Orientation: Right Pain Type: Acute pain  Precautions/Restrictions   progress as tolerated  Exercise/Treatments Supine Protraction: PROM;AROM;10 reps Horizontal ABduction: PROM;AROM;10 reps External Rotation: PROM;AROM;10 reps Internal Rotation: PROM;AROM;10 reps Flexion: PROM;AROM;10 reps ABduction: PROM;AROM;10 reps Seated Elevation: AROM;15 reps Extension: AROM;15 reps Retraction: AROM;15 reps Row: AROM;15 reps Protraction: AAROM;10 reps Horizontal ABduction: AAROM;10 reps External Rotation: AAROM;10 reps Internal Rotation: AAROM;10 reps Flexion: AAROM;10 reps Abduction: AAROM;10 reps Therapy Ball Flexion: 25 reps ABduction: 25 reps Right/Left: 5 reps ROM / Strengthening / Isometric Strengthening UBE (Upper Arm Bike): 2' and 2' at 1.0 resistance Wall Wash: 2'      Manual Therapy Manual Therapy: Myofascial release Myofascial Release: MFR and manual stretching to right shoulder region, upper arm, trapezius, and scapularis to decrease fascial restrictions and increase joint mobilty in a pain free zone.  Occupational Therapy Assessment and Plan OT Assessment and Plan Clinical Impression Statement: A:  Added AROM in supine and UBE this date.  Less tactile cuing required with exercises, however  continues to require vg to slow down and improve technique.  OT Plan: P:  Increase AROM repetitions in supine.  Less vg and increased independence with therapeutic exercises .   Goals Short Term Goals Time to Complete Short Term Goals: 4 weeks Short Term Goal 1: Patient will be educated on HEP. Short Term Goal 2: Patient will improve R shoulder PROM to [facility] in order to increase independence with dressing.  Short Term Goal 3: Patient will decrease pain to 4/10 when performing household tasks.  Short Term Goal 4: Patient will increase R shoulder strength to 3/5 in order to increase ease with donning shirt. Short Term Goal 5: Patient will decrease fascial restrictions from minimal to trace in right shoulder and upper arm regions.  Long Term Goals Time to Complete Long Term Goals: 8 weeks Long Term Goal 1: Patient will return to higher level of independence with all leisure and daily tasks. Long Term Goal 1 Progress: Progressing toward goal Long Term Goal 2: Patient will improve R shoulder AROM to WNL to increase independence with reaching in overhead kitchen cabinets.  Long Term Goal 2 Progress: Progressing toward goal Long Term Goal 3: Patient will decrease pain to 2/10 when performing household tasks.  Long Term Goal 3 Progress: Progressing toward goal Long Term Goal 4: Patient will improve R shoulder strength to 4/5 in order to increase independence with household chores such as vacuuming and mopping.  Long Term Goal 4 Progress: Progressing toward goal  Problem List Patient Active Problem List   Diagnosis Date Noted  . Muscle weakness (generalized) 08/17/2012  . Pain in joint, shoulder region 08/17/2012  . Fx upper humerus-closed 08/17/2012    End of Session Activity Tolerance: Patient tolerated treatment well General Behavior During Therapy: [facility] for tasks assessed/performed  Vangie Bicker, OTR/L   09/22/2012, 9:31  AM

## 2012-09-25 ENCOUNTER — Ambulatory Visit (HOSPITAL_COMMUNITY)
Admission: RE | Admit: 2012-09-25 | Discharge: 2012-09-25 | Disposition: A | Discharge status: Home / Self Care | Payer: PRIVATE HEALTH INSURANCE | Source: Ambulatory Visit | Attending: Family Medicine | Admitting: Family Medicine

## 2012-09-25 NOTE — Progress Notes (Signed)
Occupational Therapy Treatment Patient Details  Name: Marisa Bailey MRN: [id number] Date of Birth: 1940/08/11  Today's Date: 09/25/2012 Time: 0459-9774 OT Time Calculation (min): 38 min Manual Therapy 142-395 12' Therapeutic Exercises 904-930 26' Visit#: 8 of 12  Re-eval: 10/13/12    Authorization: UHC Medicare Primary, Medicaid secondary, Medicaid covers eval only, patient notified of this and signed self payment form.   Authorization Time Period: before 15th visit  Authorization Visit#: 8 of 15  Subjective S:  I think I felt better after doing that bike!  I like that.  Limitations: Progress as tolerated, AROM ok after 08/31/12 Pain Assessment Currently in Pain?: Yes Pain Score: 2  Pain Location: Shoulder Pain Orientation: Right  Precautions/Restrictions   progress as tolerated  Exercise/Treatments Supine Protraction: PROM;10 reps;AROM;12 reps Horizontal ABduction: PROM;10 reps;AROM;12 reps External Rotation: PROM;10 reps;AROM;12 reps Internal Rotation: PROM;10 reps;AROM;12 reps Flexion: PROM;10 reps;AROM;12 reps ABduction: 10 reps;PROM;AROM;12 reps Seated Protraction: AAROM;15 reps Horizontal ABduction: AAROM;15 reps External Rotation: AAROM;15 reps Internal Rotation: AAROM;15 reps Flexion: AAROM;15 reps Abduction: AAROM;15 reps Therapy Ball Flexion: 25 reps ABduction: 25 reps Right/Left: 5 reps ROM / Strengthening / Isometric Strengthening UBE (Upper Arm Bike): 3' and 3' 1.0 Wall Wash: 2' Proximal Shoulder Strengthening, Seated: 10X with hand over hand assistance from OTR/L for facilitation and technique      Manual Therapy Manual Therapy: Myofascial release Myofascial Release: MFR and manual stretching to right shoulder region, upper arm, trapezius, and scapularis to decrease fascial restrictions and increase joint mobilty in a pain free zone.  Occupational Therapy Assessment and Plan OT Assessment and Plan Clinical Impression Statement: A:  Added  proximal shoulder strengthening in supine, required facilitation for technique and for general range. OT Plan: P:  Complete proximal shoulder strengthening without facilitation.     Goals Short Term Goals Time to Complete Short Term Goals: 4 weeks Short Term Goal 1: Patient will be educated on HEP. Short Term Goal 2: Patient will improve R shoulder PROM to [facility] in order to increase independence with dressing.  Short Term Goal 3: Patient will decrease pain to 4/10 when performing household tasks.  Short Term Goal 4: Patient will increase R shoulder strength to 3/5 in order to increase ease with donning shirt. Short Term Goal 5: Patient will decrease fascial restrictions from minimal to trace in right shoulder and upper arm regions.  Short Term Goal 5 Progress: Progressing toward goal Long Term Goals Time to Complete Long Term Goals: 8 weeks Long Term Goal 1: Patient will return to higher level of independence with all leisure and daily tasks. Long Term Goal 1 Progress: Progressing toward goal Long Term Goal 2: Patient will improve R shoulder AROM to WNL to increase independence with reaching in overhead kitchen cabinets.  Long Term Goal 2 Progress: Progressing toward goal Long Term Goal 3: Patient will decrease pain to 2/10 when performing household tasks.  Long Term Goal 3 Progress: Progressing toward goal Long Term Goal 4: Patient will improve R shoulder strength to 4/5 in order to increase independence with household chores such as vacuuming and mopping.  Long Term Goal 4 Progress: Progressing toward goal  Problem List Patient Active Problem List   Diagnosis Date Noted  . Muscle weakness (generalized) 08/17/2012  . Pain in joint, shoulder region 08/17/2012  . Fx upper humerus-closed 08/17/2012    End of Session Activity Tolerance: Patient tolerated treatment well General Behavior During Therapy: [facility] for tasks assessed/performed  [place], OTR/L  09/25/2012,  9:24 AM

## 2012-09-28 ENCOUNTER — Ambulatory Visit (HOSPITAL_COMMUNITY)
Admission: RE | Admit: 2012-09-28 | Discharge: 2012-09-28 | Disposition: A | Discharge status: Home / Self Care | Payer: PRIVATE HEALTH INSURANCE | Source: Ambulatory Visit | Attending: Family Medicine | Admitting: Family Medicine

## 2012-09-28 NOTE — Progress Notes (Signed)
Occupational Therapy Treatment Patient Details  Name: Marisa Bailey MRN: [id number] Date of Birth: 1940/04/02  Today's Date: 09/28/2012 Time: 6712-4580 OT Time Calculation (min): 51 min Manual Therapy 845-910 25' Therapeutic Exercises 910-936 26' Visit#: 9 of 12  Re-eval: 10/13/12    Authorization: UHC Medicare Primary, Medicaid secondary, Medicaid covers eval only, patient notified of this and signed self payment form.   Authorization Time Period: before 15th visit  Authorization Visit#: 9 of 15  Subjective S:  Its hurting today. Limitations: Progress as tolerated, AROM ok after 08/31/12 Pain Assessment Currently in Pain?: Yes Pain Score: 6  Pain Location: Shoulder Pain Orientation: Right  Precautions/Restrictions   progress as tolerated  Exercise/Treatments Supine Protraction: PROM;10 reps;AROM;12 reps Horizontal ABduction: PROM;10 reps;AROM;12 reps External Rotation: PROM;10 reps;AROM;12 reps Internal Rotation: PROM;10 reps;AROM;12 reps Flexion: PROM;10 reps;AROM;12 reps ABduction: 10 reps;PROM;AROM;12 reps Seated Protraction: AAROM;15 reps Horizontal ABduction: AAROM;15 reps External Rotation: AAROM;15 reps Internal Rotation: AAROM;15 reps Flexion: AAROM;15 reps Abduction: AAROM;15 reps Standing Extension: Theraband;10 reps Theraband Level (Shoulder Extension): Level 2 (Red) Row: Theraband;10 reps Theraband Level (Shoulder Row): Level 2 (Red) Retraction: Theraband;10 reps Theraband Level (Shoulder Retraction): Level 2 (Red)  Therapy Ball Flexion: 25 reps ABduction: 25 reps Right/Left: 5 reps ROM / Strengthening / Isometric Strengthening UBE (Upper Arm Bike): unavailable Wall Wash: 3' Other ROM/Strengthening Exercises: nustep X 5 minutes      Manual Therapy Manual Therapy: Myofascial release Myofascial Release: MFR and manual stretching to right shoulder region, upper arm, trapezius, and scapularis to decrease fascial restrictions and increase joint  mobilty in a pain free zone.  Occupational Therapy Assessment and Plan OT Assessment and Plan Clinical Impression Statement: A:  Added theraband for scapular stability.  Added Nustep. OT Plan: P:  Attempt AROM in seated for increased functional use of RUE.   Goals Short Term Goals Time to Complete Short Term Goals: 4 weeks Short Term Goal 1: Patient will be educated on HEP. Short Term Goal 2: Patient will improve R shoulder PROM to [facility] in order to increase independence with dressing.  Short Term Goal 3: Patient will decrease pain to 4/10 when performing household tasks.  Short Term Goal 3 Progress: Progressing toward goal Short Term Goal 4: Patient will increase R shoulder strength to 3/5 in order to increase ease with donning shirt. Short Term Goal 5: Patient will decrease fascial restrictions from minimal to trace in right shoulder and upper arm regions.  Short Term Goal 5 Progress: Progressing toward goal Long Term Goals Time to Complete Long Term Goals: 8 weeks Long Term Goal 1: Patient will return to higher level of independence with all leisure and daily tasks. Long Term Goal 1 Progress: Progressing toward goal Long Term Goal 2: Patient will improve R shoulder AROM to WNL to increase independence with reaching in overhead kitchen cabinets.  Long Term Goal 2 Progress: Progressing toward goal Long Term Goal 3: Patient will decrease pain to 2/10 when performing household tasks.  Long Term Goal 3 Progress: Progressing toward goal Long Term Goal 4: Patient will improve R shoulder strength to 4/5 in order to increase independence with household chores such as vacuuming and mopping.  Long Term Goal 4 Progress: Progressing toward goal  Problem List Patient Active Problem List   Diagnosis Date Noted  . Muscle weakness (generalized) 08/17/2012  . Pain in joint, shoulder region 08/17/2012  . Fx upper humerus-closed 08/17/2012    End of Session Activity Tolerance: Patient tolerated  treatment well General Behavior During Therapy: [facility]  for tasks assessed/performed  [place], OTR/L  09/28/2012, 9:32 AM

## 2012-09-29 ENCOUNTER — Ambulatory Visit (HOSPITAL_COMMUNITY): Payer: PRIVATE HEALTH INSURANCE | Admitting: Specialist

## 2012-09-30 ENCOUNTER — Ambulatory Visit (HOSPITAL_COMMUNITY)
Admission: RE | Admit: 2012-09-30 | Discharge: 2012-09-30 | Disposition: A | Discharge status: Home / Self Care | Payer: PRIVATE HEALTH INSURANCE | Source: Ambulatory Visit | Attending: Family Medicine | Admitting: Family Medicine

## 2012-09-30 NOTE — Progress Notes (Signed)
Occupational Therapy Treatment Patient Details  Name: Marisa Bailey MRN: [id number] Date of Birth: August 11, 1940  Today's Date: 09/30/2012 Time: 0867-6195 OT Time Calculation (min): 47 min MFR 093-267 10' Therex 124-580 37'  Visit#: 10 of 12  Re-eval: 10/13/12    Authorization: UHC Medicare Primary, Medicaid secondary, Medicaid covers eval only, patient notified of this and signed self payment form.   Authorization Time Period: before 15th visit  Authorization Visit#: 10 of 15  Subjective Symptoms/Limitations Symptoms: S: My arm is hurting just a little today.  Pain Assessment Currently in Pain?: Yes Pain Score: 5  Pain Location: Shoulder Pain Orientation: Right Pain Type: Acute pain  Precautions/Restrictions  Precautions Precautions: Shoulder  Exercise/Treatments Supine Protraction: PROM;10 reps;AROM;12 reps Horizontal ABduction: PROM;10 reps;AROM;12 reps External Rotation: PROM;10 reps;AROM;12 reps Internal Rotation: PROM;10 reps;AROM;12 reps Flexion: PROM;10 reps;AROM;12 reps ABduction: 10 reps;PROM;AROM;12 reps Seated Protraction: AROM;10 reps Horizontal ABduction: AROM;10 reps External Rotation: AROM;10 reps Internal Rotation: AROM;10 reps Flexion: AROM;10 reps Abduction: AROM;10 reps Therapy Ball Flexion: 25 reps ABduction: 25 reps ROM / Strengthening / Isometric Strengthening UBE (Upper Arm Bike): 1.0 3' forward 3' reverse Proximal Shoulder Strengthening, Supine: 10X Proximal Shoulder Strengthening, Seated: 10X       Manual Therapy Manual Therapy: Myofascial release Myofascial Release: MFR and manual stretching to right shoulder region, upper arm, trapezius, and scapularis to decrease fascial restrictions and increase joint mobilty in a pain free zone  Occupational Therapy Assessment and Plan OT Assessment and Plan Clinical Impression Statement: A: Added AROM seated. Patient required verbal cues for form and technique with all AROM exercises supine  and seated.  OT Plan: P: Cont. to work on increasing AROM and PROM to [facility] for decreased difficulty completing reaching activities.   Goals Short Term Goals Time to Complete Short Term Goals: 4 weeks Short Term Goal 1: Patient will be educated on HEP. Short Term Goal 2: Patient will improve R shoulder PROM to [facility] in order to increase independence with dressing.  Short Term Goal 3: Patient will decrease pain to 4/10 when performing household tasks.  Short Term Goal 4: Patient will increase R shoulder strength to 3/5 in order to increase ease with donning shirt. Short Term Goal 5: Patient will decrease fascial restrictions from minimal to trace in right shoulder and upper arm regions.  Long Term Goals Time to Complete Long Term Goals: 8 weeks Long Term Goal 1: Patient will return to higher level of independence with all leisure and daily tasks. Long Term Goal 2: Patient will improve R shoulder AROM to WNL to increase independence with reaching in overhead kitchen cabinets.  Long Term Goal 3: Patient will decrease pain to 2/10 when performing household tasks.  Long Term Goal 4: Patient will improve R shoulder strength to 4/5 in order to increase independence with household chores such as vacuuming and mopping.   Problem List Patient Active Problem List   Diagnosis Date Noted  . Muscle weakness (generalized) 08/17/2012  . Pain in joint, shoulder region 08/17/2012  . Fx upper humerus-closed 08/17/2012    End of Session Activity Tolerance: Patient tolerated treatment well General Behavior During Therapy: [facility] for tasks assessed/performed   Ailene Ravel, OTR/L,CBIS   09/30/2012, 9:17 AM

## 2012-10-02 ENCOUNTER — Ambulatory Visit (HOSPITAL_COMMUNITY): Payer: PRIVATE HEALTH INSURANCE | Admitting: Specialist

## 2012-10-07 ENCOUNTER — Ambulatory Visit (HOSPITAL_COMMUNITY)
Admission: RE | Admit: 2012-10-07 | Discharge: 2012-10-07 | Disposition: A | Discharge status: Home / Self Care | Payer: PRIVATE HEALTH INSURANCE | Source: Ambulatory Visit | Attending: Family Medicine | Admitting: Family Medicine

## 2012-10-07 NOTE — Progress Notes (Signed)
Occupational Therapy Treatment Patient Details  Name: Marisa Bailey MRN: [id number] Date of Birth: [birth year]  Today's Date: 10/07/2012 Time: 1638-4665 OT Time Calculation (min): 50 min MFR 840-851  11' Therex 993-570  39'  Visit#: 11 of 12  Re-eval: 10/13/12    Authorization: UHC Medicare Primary, Medicaid secondary, Medicaid covers eval only, patient notified of this and signed self payment form.   Authorization Time Period: before 15th visit  Authorization Visit#: 11 of 15  Subjective Symptoms/Limitations Symptoms: S: I have good days and bad days with this arm.  Pain Assessment Currently in Pain?: Yes Pain Score: 3  Pain Location: Shoulder Pain Orientation: Right Pain Type: Acute pain  Precautions/Restrictions  Precautions Precautions: Shoulder  Exercise/Treatments Supine Protraction: PROM;Strengthening;Weights;10 reps Protraction Weight (lbs): 1 Horizontal ABduction: PROM;Strengthening;Weights;10 reps Horizontal ABduction Weight (lbs): 1 External Rotation: PROM;Strengthening;Weights;10 reps External Rotation Weight (lbs): 1 Internal Rotation: PROM;Strengthening;Weights;10 reps Internal Rotation Weight (lbs): 1 Flexion: PROM;Strengthening;Weights;10 reps Shoulder Flexion Weight (lbs): 1 ABduction: PROM;10 reps;AROM;12 reps Seated Protraction: AROM;12 reps Horizontal ABduction: AROM;12 reps External Rotation: AROM;12 reps Internal Rotation: AROM;12 reps Flexion: AROM;12 reps Abduction: AROM;12 reps ROM / Strengthening / Isometric Strengthening UBE (Upper Arm Bike): 1.5 3' reverse 3' forward Wall Wash: 3' "W" Arms: 10X X to V Arms: 10X Proximal Shoulder Strengthening, Supine: 10X Proximal Shoulder Strengthening, Seated: 10X       Manual Therapy Manual Therapy: Myofascial release Myofascial Release: MFR and manual stretching to right shoulder region, upper arm, trapezius, and scapularis to decrease fascial restrictions and increase joint mobilty in a  pain free zone  Occupational Therapy Assessment and Plan OT Assessment and Plan Clinical Impression Statement: A: Addded 1# supine exercises with the exception of Abduction due to pain with AROM. Increased resistance on UBE bike to 1.5. Patient tolerated well.  OT Plan: P: Reassess. Work on being able to complete all supine stretngthening exercises with 1#.   Goals Short Term Goals Time to Complete Short Term Goals: 4 weeks Short Term Goal 1: Patient will be educated on HEP. Short Term Goal 2: Patient will improve R shoulder PROM to [facility] in order to increase independence with dressing.  Short Term Goal 3: Patient will decrease pain to 4/10 when performing household tasks.  Short Term Goal 3 Progress: Progressing toward goal Short Term Goal 4: Patient will increase R shoulder strength to 3/5 in order to increase ease with donning shirt. Short Term Goal 5: Patient will decrease fascial restrictions from minimal to trace in right shoulder and upper arm regions.  Short Term Goal 5 Progress: Progressing toward goal Long Term Goals Time to Complete Long Term Goals: 8 weeks Long Term Goal 1: Patient will return to higher level of independence with all leisure and daily tasks. Long Term Goal 1 Progress: Progressing toward goal Long Term Goal 2: Patient will improve R shoulder AROM to WNL to increase independence with reaching in overhead kitchen cabinets.  Long Term Goal 2 Progress: Progressing toward goal Long Term Goal 3: Patient will decrease pain to 2/10 when performing household tasks.  Long Term Goal 3 Progress: Progressing toward goal Long Term Goal 4: Patient will improve R shoulder strength to 4/5 in order to increase independence with household chores such as vacuuming and mopping.  Long Term Goal 4 Progress: Progressing toward goal  Problem List Patient Active Problem List   Diagnosis Date Noted  . Muscle weakness (generalized) 08/17/2012  . Pain in joint, shoulder region 08/17/2012   . Fx upper humerus-closed 08/17/2012  End of Session Activity Tolerance: Patient tolerated treatment well General Behavior During Therapy: [facility] for tasks assessed/performed   Ailene Ravel, OTR/L,CBIS   10/07/2012, 9:33 AM

## 2012-10-14 ENCOUNTER — Ambulatory Visit (HOSPITAL_COMMUNITY): Payer: PRIVATE HEALTH INSURANCE | Admitting: Specialist

## 2012-10-20 ENCOUNTER — Ambulatory Visit (HOSPITAL_COMMUNITY)
Admission: RE | Admit: 2012-10-20 | Discharge: 2012-10-20 | Disposition: A | Discharge status: Home / Self Care | Payer: PRIVATE HEALTH INSURANCE | Source: Ambulatory Visit | Attending: Orthopaedic Surgery | Admitting: Orthopaedic Surgery

## 2012-10-20 DIAGNOSIS — M6281 Muscle weakness (generalized): Secondary | ICD-10-CM | POA: Insufficient documentation

## 2012-10-20 DIAGNOSIS — M25519 Pain in unspecified shoulder: Secondary | ICD-10-CM | POA: Insufficient documentation

## 2012-10-20 DIAGNOSIS — IMO0001 Reserved for inherently not codable concepts without codable children: Secondary | ICD-10-CM | POA: Insufficient documentation

## 2012-10-20 DIAGNOSIS — I10 Essential (primary) hypertension: Secondary | ICD-10-CM | POA: Insufficient documentation

## 2012-10-20 NOTE — Progress Notes (Signed)
Occupational Therapy Treatment Patient Details  Name: Marisa Bailey MRN: [id number] Date of Birth: [birth year]  Today's Date: 10/20/2012 Time: 0076-2263 OT Time Calculation (min): 38 min Manual therapy 940-949 9' ROM/MMT [phone] 11' Therapeutic Ex 1000-1018 18' Visit#: 12 of 16  Re-eval: 11/17/12    Authorization: UHC Medicare Primary, Medicaid secondary, Medicaid covers eval only, patient notified of this and signed self payment form.   Authorization Time Period: before 22 visit  Authorization Visit#: 12 of 22  Subjective  S:  I want to raise it up just lke I do my left one.   Pain Assessment Currently in Pain?: Yes Pain Score: 1  Pain Location: Shoulder Pain Orientation: Right Pain Type: Acute pain  Precautions/Restrictions   progress as tolerated  Exercise/Treatments Supine  PROM x 10 strengthening with 1# x 10 Seated  AROM x 10 Wall wash and ube              Manual Therapy Manual Therapy: Myofascial release Myofascial Release: MFR and manual stretching to right shoulder region, upper arm, trapezius, and scapularis to decrease fascial restrictions and increase joint mobilty in a pain free zone  Occupational Therapy Assessment and Plan OT Assessment and Plan Clinical Impression Statement: A: Reassessment completed this date: seated AROM and strength (supine AROM and strength) flexion 115 4-/5 (155 3+/5), abduction 110 4-/5 (140 3+/5), external rotation with shoulder adducted 50 4/5 (35 3+/5), internal rotation with shoulder adducted 90 4/5 (80 3+/5). She feels she is able to complete activities at waist to shoulder height, and reaching overhead and lifting heavier items is very difficult. OT Plan: P:  Improve independence with functional activities at shoulder height and above.     Goals Short Term Goals Time to Complete Short Term Goals: 4 weeks Short Term Goal 1: Patient will be educated on HEP. Short Term Goal 2: Patient will improve R shoulder PROM to  [facility] in order to increase independence with dressing.  Short Term Goal 3: Patient will decrease pain to 4/10 when performing household tasks.  Short Term Goal 3 Progress: Met Short Term Goal 4: Patient will increase R shoulder strength to 3/5 in order to increase ease with donning shirt. Short Term Goal 5: Patient will decrease fascial restrictions from minimal to trace in right shoulder and upper arm regions.  Short Term Goal 5 Progress: Progressing toward goal Long Term Goals Time to Complete Long Term Goals: 8 weeks Long Term Goal 1: Patient will return to higher level of independence with all leisure and daily tasks. Long Term Goal 1 Progress: Progressing toward goal Long Term Goal 2: Patient will improve R shoulder AROM to WNL to increase independence with reaching in overhead kitchen cabinets.  Long Term Goal 2 Progress: Progressing toward goal Long Term Goal 3: Patient will decrease pain to 2/10 when performing household tasks.  Long Term Goal 3 Progress: Met Long Term Goal 4: Patient will improve R shoulder strength to 4/5 in order to increase independence with household chores such as vacuuming and mopping.  Long Term Goal 4 Progress: Progressing toward goal  Problem List Patient Active Problem List   Diagnosis Date Noted  . Muscle weakness (generalized) 08/17/2012  . Pain in joint, shoulder region 08/17/2012  . Fx upper humerus-closed 08/17/2012    End of Session Activity Tolerance: Patient tolerated treatment well General Behavior During Therapy: WFL for tasks assessed/performed  GO Functional Assessment Tool Used: UEFI was 78 and is currently 96 - based on clincial observation also, patient is  25% limited. Functional Limitation: Carrying, moving and handling objects Carrying, Moving and Handling Objects Current Status [phone]): At least 20 percent but less than 40 percent impaired, limited or restricted Carrying, Moving and Handling Objects Goal Status [phone]): At least 1  percent but less than 20 percent impaired, limited or restricted  Vangie Bicker, OTR/L  10/20/2012, 1:23 PM

## 2012-10-27 ENCOUNTER — Ambulatory Visit (HOSPITAL_COMMUNITY): Payer: PRIVATE HEALTH INSURANCE

## 2012-11-03 ENCOUNTER — Inpatient Hospital Stay (HOSPITAL_COMMUNITY): Admission: RE | Admit: 2012-11-03 | Payer: PRIVATE HEALTH INSURANCE | Source: Ambulatory Visit

## 2012-11-06 ENCOUNTER — Telehealth (HOSPITAL_COMMUNITY): Payer: Self-pay

## 2012-11-06 NOTE — Telephone Encounter (Signed)
Called patient about missed appointment on Tuesday 11/03/12. Patient stated that at her doctor visit Monday, with Dr. Luna Glasgow, he stated that she not longer needed to attend therapy. Patient will be discharged from therapy services.   Ailene Ravel, OTR/L,CBIS  11/06/12 11:50AM

## 2012-12-09 ENCOUNTER — Other Ambulatory Visit (HOSPITAL_COMMUNITY): Payer: Self-pay | Admitting: Family Medicine

## 2012-12-09 DIAGNOSIS — Z09 Encounter for follow-up examination after completed treatment for conditions other than malignant neoplasm: Secondary | ICD-10-CM

## 2013-01-20 ENCOUNTER — Ambulatory Visit (HOSPITAL_COMMUNITY)
Admission: RE | Admit: 2013-01-20 | Discharge: 2013-01-20 | Disposition: A | Discharge status: Home / Self Care | Payer: PRIVATE HEALTH INSURANCE | Source: Ambulatory Visit | Attending: Family Medicine | Admitting: Family Medicine

## 2013-01-20 ENCOUNTER — Other Ambulatory Visit (HOSPITAL_COMMUNITY): Payer: Self-pay | Admitting: Family Medicine

## 2013-01-20 DIAGNOSIS — Z09 Encounter for follow-up examination after completed treatment for conditions other than malignant neoplasm: Secondary | ICD-10-CM

## 2013-01-20 DIAGNOSIS — R928 Other abnormal and inconclusive findings on diagnostic imaging of breast: Secondary | ICD-10-CM | POA: Insufficient documentation

## 2013-06-25 ENCOUNTER — Other Ambulatory Visit (HOSPITAL_COMMUNITY): Payer: Self-pay | Admitting: Family Medicine

## 2013-06-25 DIAGNOSIS — R928 Other abnormal and inconclusive findings on diagnostic imaging of breast: Secondary | ICD-10-CM

## 2013-07-28 ENCOUNTER — Ambulatory Visit (HOSPITAL_COMMUNITY)
Admission: RE | Admit: 2013-07-28 | Discharge: 2013-07-28 | Disposition: A | Discharge status: Home / Self Care | Payer: PRIVATE HEALTH INSURANCE | Source: Ambulatory Visit | Attending: Family Medicine | Admitting: Family Medicine

## 2013-07-28 DIAGNOSIS — R928 Other abnormal and inconclusive findings on diagnostic imaging of breast: Secondary | ICD-10-CM

## 2013-07-28 DIAGNOSIS — R922 Inconclusive mammogram: Secondary | ICD-10-CM | POA: Diagnosis present

## 2013-09-04 ENCOUNTER — Emergency Department (HOSPITAL_COMMUNITY): Payer: PRIVATE HEALTH INSURANCE

## 2013-09-04 ENCOUNTER — Encounter (HOSPITAL_COMMUNITY): Payer: Self-pay | Admitting: Emergency Medicine

## 2013-09-04 ENCOUNTER — Emergency Department (HOSPITAL_COMMUNITY)
Admission: EM | Admit: 2013-09-04 | Discharge: 2013-09-04 | Disposition: A | Discharge status: Home / Self Care | Payer: PRIVATE HEALTH INSURANCE | Attending: Emergency Medicine | Admitting: Emergency Medicine

## 2013-09-04 DIAGNOSIS — Z8709 Personal history of other diseases of the respiratory system: Secondary | ICD-10-CM | POA: Diagnosis not present

## 2013-09-04 DIAGNOSIS — R05 Cough: Secondary | ICD-10-CM

## 2013-09-04 DIAGNOSIS — Z7982 Long term (current) use of aspirin: Secondary | ICD-10-CM | POA: Insufficient documentation

## 2013-09-04 DIAGNOSIS — F17200 Nicotine dependence, unspecified, uncomplicated: Secondary | ICD-10-CM | POA: Insufficient documentation

## 2013-09-04 DIAGNOSIS — Z79899 Other long term (current) drug therapy: Secondary | ICD-10-CM | POA: Diagnosis not present

## 2013-09-04 DIAGNOSIS — Z880 Allergy status to penicillin: Secondary | ICD-10-CM | POA: Diagnosis not present

## 2013-09-04 DIAGNOSIS — I10 Essential (primary) hypertension: Secondary | ICD-10-CM | POA: Diagnosis not present

## 2013-09-04 DIAGNOSIS — R059 Cough, unspecified: Secondary | ICD-10-CM | POA: Insufficient documentation

## 2013-09-04 DIAGNOSIS — Z792 Long term (current) use of antibiotics: Secondary | ICD-10-CM | POA: Insufficient documentation

## 2013-09-04 HISTORY — DX: Bronchitis, not specified as acute or chronic: J40

## 2013-09-04 LAB — CBC WITH DIFFERENTIAL/PLATELET
Basophils Absolute: 0 K/uL (ref 0.0–0.1)
Basophils Relative: 0 % (ref 0–1)
Eosinophils Absolute: 0.6 K/uL (ref 0.0–0.7)
Eosinophils Relative: 8 % — ABNORMAL HIGH (ref 0–5)
HCT: 38.9 % (ref 36.0–46.0)
Hemoglobin: 12.9 g/dL (ref 12.0–15.0)
Lymphocytes Relative: 25 % (ref 12–46)
Lymphs Abs: 1.9 K/uL (ref 0.7–4.0)
MCH: 28.7 pg (ref 26.0–34.0)
MCHC: 33.2 g/dL (ref 30.0–36.0)
MCV: 86.4 fL (ref 78.0–100.0)
Monocytes Absolute: 0.4 K/uL (ref 0.1–1.0)
Monocytes Relative: 6 % (ref 3–12)
Neutro Abs: 4.6 K/uL (ref 1.7–7.7)
Neutrophils Relative %: 61 % (ref 43–77)
Platelets: 246 K/uL (ref 150–400)
RBC: 4.5 MIL/uL (ref 3.87–5.11)
RDW: 13.9 % (ref 11.5–15.5)
WBC: 7.5 K/uL (ref 4.0–10.5)

## 2013-09-04 LAB — BASIC METABOLIC PANEL
Anion gap: 10 (ref 5–15)
BUN: 15 mg/dL (ref 6–23)
CO2: 29 mEq/L (ref 19–32)
Calcium: 8.9 mg/dL (ref 8.4–10.5)
Chloride: 103 mEq/L (ref 96–112)
Creatinine, Ser: 0.65 mg/dL (ref 0.50–1.10)
GFR calc Af Amer: >90 mL/min (ref >90)
GFR calc non Af Amer: 87 mL/min — ABNORMAL LOW (ref >90)
Glucose, Bld: 91 mg/dL (ref 70–99)
Potassium: 3.6 mEq/L — ABNORMAL LOW (ref 3.7–5.3)
Sodium: 142 mEq/L (ref 137–147)

## 2013-09-04 LAB — PRO B NATRIURETIC PEPTIDE: Pro B Natriuretic peptide (BNP): 73.8 pg/mL (ref 0–125)

## 2013-09-04 LAB — TROPONIN I: Troponin I: <0.3 ng/mL

## 2013-09-04 MED ORDER — DOXYCYCLINE HYCLATE 100 MG PO TABS: 100.0000 mg | ORAL_TABLET | Freq: Two times a day (BID) | ORAL | Status: DC

## 2013-09-04 NOTE — Discharge Instructions (Signed)
Emergency Department Resource Guide 1) Find a Doctor and Pay Out of Pocket Although you won't have to find out who is covered by your insurance plan, it is a good idea to ask around and get recommendations. You will then need to call the office and see if the doctor you have chosen will accept you as a new patient and what types of options they offer for patients who are self-pay. Some doctors offer discounts or will set up payment plans for their patients who do not have insurance, but you will need to ask so you aren't surprised when you get to your appointment.  2) Contact Your Local Health Department Not all health departments have doctors that can see patients for sick visits, but many do, so it is worth a call to see if yours does. If you don't know where your local health department is, you can check in your phone book. The CDC also has a tool to help you locate your state's health department, and many state websites also have listings of all of their local health departments.  3) Find a [facility] If your illness is not likely to be very severe or complicated, you may want to try a walk in clinic. These are popping up all over the country in pharmacies, drugstores, and shopping centers. They're usually staffed by nurse practitioners or physician assistants that have been trained to treat common illnesses and complaints. They're usually fairly quick and inexpensive. However, if you have serious medical issues or chronic medical problems, these are probably not your best option.  No Primary Care Doctor: - Call Health Connect at  [phone] - they can help you locate a primary care doctor that  accepts your insurance, provides certain services, etc. - Physician Referral Service- [phone]  Chronic Pain Problems: Organization         Address  Phone   Notes  [facility]  [phone] Patients need to be referred by their primary care doctor.   Medication  Assistance: Organization         Address  Phone   Notes  [facility] Medication [facility] [address] [place], [place] [address] [phone] --Must be a resident of [place] [facility] -- Must have NO insurance coverage whatsoever (no Medicaid/ Medicare, etc.) -- The pt. MUST have a primary care doctor that directs their care regularly and follows them in the community   [facility]  [phone]   [employer]  [phone]    Agencies that provide inexpensive medical care: Organization         Address  Phone   Notes  [place]  [phone]   [facility]    [phone]   [facility] [place], [place] [address] [phone]   [place] [address] [place] [phone]   Planned Parenthood    [phone]   [facility]    [phone]   [place] and [address], [place] Phone:  [phone], Fax:  [phone] Hours of Operation:  9 am - 6 pm, M-F.  Also accepts Medicaid/Medicare and self-pay.  [facility] for [place], [address],  Phone: [phone], Fax: [phone]. Hours of Operation:  8:30 am - 5:30 pm, M-F.  Also accepts Medicaid and self-pay.  [facility] [place] [address], [place] Phone: [phone]   [place], [place], [place] [phone], Ext. [phone] Mondays & Thursdays: 7-9 AM.  First 15 patients are seen on a first come, first serve basis.    [place] Providers:  Organization         Address  Phone   Notes  [facility] [address], [place] [phone] Also accepts self-pay patients.  [facility] [address], [place]  [phone]   [place], [address], [place]  [phone]   [facility] [address], [place] [phone]   Lucianne Lei [address], [place]   [phone] Only accepts [place] Access [place] patients after they have their name applied to their card.   Self-Pay (no insurance) in [facility]:  Organization         Address  Phone   Notes  Sickle Cell Patients, [facility] [place] [phone]   [facility] [place] [phone]   [facility] [place]  [place], [place], [place] [phone]   [facility]/Dr. Osei-Bonsu  [address] [place] or [address], [place] [phone] Phone number for both [place] and [place] locations is the same.  [facility] [address] [place] [phone]   [facility] [address] [place] or [address] [phone]   [facility] [address], [place] [phone], phone; [phone], fax Sees patients 1st and 3rd Saturday of every month.  Must not qualify for public or private insurance (i.e. Medicaid, Medicare, [place] Health Choice, Veterans' Benefits)  Household income should be no more than 200% of the poverty level The clinic cannot treat you if you are pregnant or think you are pregnant  Sexually transmitted diseases are not treated at the clinic.    Dental Care: Organization         Address  Phone  Notes  [place] [facility] [place] [phone] Accepts children up to age 4 who are enrolled in [place] or [place]; pregnant women with a Medicaid card; and children who have applied for Medicaid or [place] Health Choice, but were declined, whose parents can pay a reduced fee at time of service.  [facility]  [facility]  [address], [place] [phone] Accepts children up to age 35 who are enrolled in [place] or [place]; pregnant women with a Medicaid card; and children who have applied for Medicaid or [place] Health Choice, but were declined, whose parents can pay a reduced fee at time of service.  [place] [facility]  [place] [phone] Patients are seen by appointment only. Walk-ins are not accepted. [place] will see patients 43 years of age and older. Monday - Tuesday (8am-5pm) Most Wednesdays (8:30-5pm) $30 per visit, cash only  [facility] Adult Dental Access PROGRAM  [address], [facility] [phone] Patients are seen by appointment only. Walk-ins are not accepted. [place] will see patients 40 years of age and older. One  Wednesday Evening (Monthly: Volunteer Based).  $30 per visit, cash only  °[facility]  [phone] for adults; Children under age 4, call Graduate Pediatric Dentistry at [phone]. Children aged 4-14, please call [phone] to request a pediatric application. ° Dental services are provided in all areas of dental care including fillings, crowns and bridges, complete and partial dentures, implants, gum treatment, root canals, and extractions. Preventive care is also provided. Treatment is provided to both adults and children. °Patients are selected via a lottery and there is often a waiting list. °  °[facility] [address], °[place] ° ([phone] [email or web address] °  °[facility] [address], [place], [place] ([phone], Ext. [phone] Second and Fourth Thursday of each month, opens at 6:30 AM; Clinic ends at 9 AM.  Patients are seen on a first-come first-served basis, and a limited number are seen during each clinic.  ° °[facility] ° [address], [place], [place] [phone]    Eligibility Requirements °You must have lived in [place], [place], or [place] counties for at least the last three months. °  You cannot be eligible for state or federal sponsored healthcare insurance, including Veterans Administration, Medicaid, or Medicare. °  You generally cannot be eligible for healthcare insurance through your employer.  °  How to apply: °Eligibility screenings are held every Tuesday and Wednesday afternoon from 1:00 pm until 4:00 pm. You do not need an appointment for the interview!  °[facility] [address], [place], [place] [phone]   °[facility]  [phone]   °[facility]  [phone]   °[facility]  336-570-6415   ° °Behavioral Health Resources in the Community: °Intensive Outpatient Programs °Organization         Address  Phone  Notes  °[facility] [address], [place], [place] [phone]   °[facility] [address], [place], [place] [phone]   °[facility] [address], [place], [place] ° 336-882-2125   °[facility] [address],  °[place], [place] 1-800-853-5163 or 336-641-4981   °Substance Abuse Resources °Organization         Address  Phone  Notes  °[facility]  336-882-2125   °[facility]  336-784-9470   °[facility]  336-285-9073   °[facility]  336-845-3988   °Residential & Outpatient Substance Abuse Program  1-800-659-3381   °Psychological Services °Organization         Address  Phone  Notes  ° Health  336- [phone]   °[facility]  336- 378-7881   °[facility] [address], [place] 1-800-853-5163 or 336-641-4981   ° °Mobile Crisis Teams °Organization         Address  Phone  Notes  °[facility], Mobile Crisis Care Unit  1-877-626-1772   °Assertive °Psychotherapeutic Services ° [address]  [place], [place] 336-834-9664   °Sharon DeEsch [address] °[place] 336-554-5454   ° °Self-Help/Support Groups °Organization         Address  Phone             Notes  °[facility] [place] - variety of support groups  336- 373-1402 Call for more information  °Narcotics Anonymous (NA), [facility] [address], °[place]   2 meetings at this location  ° °  Residential Treatment Programs Organization         Address  Phone  Notes  [facility] [address]    [place]  1-6196411744   [facility]  [address], [place] [address], [place], [place]   [place], [place] [phone] Admissions: 8am-3pm M-F  [facility] Substance [place] [address]    [place], [place] 335-825-1898   [facility] [address] [place], [place], [place]   [facility] [address],  [place], [place]   [facility] - Intensive Outpatient [address], [place], [place]   [facility] ([place]  [place], [place] 1-(340)803-0863 or [phone]   [facility] ([facility]) [address] [place], [place] Accepts Medicaid  [facility] [place] [address],  [place] 1-670-036-8144 Substance Abuse/Addiction Treatment   [facility] Organization         Address  Phone  Notes  [facility]  605-118-6937   Domenic Schwab, PhD [address] Arlis [place], [place]   [phone] or [phone]   [place], [place] [phone]   [facility] [address] [place], [place] [phone] Insurance/Medicaid/sponsorship through [place] [facility] and Families [address] [place] [address]                                    [place], [place] [phone] [place] [address], [place] [phone]    Dr. Adele Schilder  [phone]   [facility] Dept. 1) [address], [place] 2) [place] 3)  [address], [place] [phone]  [phone]   [place] [phone] or [phone] (After Hours)       Take the prescription as directed.  Call your regular medical doctor on Monday to schedule a follow up appointment within the next 2 days.  Return to the Emergency Department immediately sooner if worsening.

## 2013-09-04 NOTE — ED Notes (Signed)
Was seen by home health nurse told to come to ER.  Pt denies SOB.

## 2013-09-04 NOTE — ED Provider Notes (Signed)
CSN: [id number]     Arrival date & time 09/04/13  1459 History   First MD Initiated Contact with Patient 09/04/13 1650     Chief Complaint  Patient presents with  . Congestive Heart Failure      HPI Pt was seen at 1710. Per pt, c/o gradual onset and persistence of constant "crackles in my lungs" since earlier today. Pt states her Home Health RN came to her house today and "told me my lungs didn't sound right." States she was then told that "I needed to come to the ER to get checked out to make sure I didn't have heart failure." Pt states she "feels fine" and "don't really know why I had to come here." Only endorses mild intermittent cough for the past few days. Denies CP/SOB, no abd pain, no N/V/D, no fevers.     Past Medical History  Diagnosis Date  . Hypertension   . Bronchitis    Past Surgical History  Procedure Laterality Date  . Enucleation      History  Substance Use Topics  . Smoking status: Current Every Day Smoker  . Smokeless tobacco: Not on file  . Alcohol Use: No    Review of Systems ROS: Statement: All systems negative except as marked or noted in the HPI; Constitutional: Negative for fever and chills. ; ; Eyes: Negative for eye pain, redness and discharge. ; ; ENMT: Negative for ear pain, hoarseness, nasal congestion, sinus pressure and sore throat. ; ; Cardiovascular: Negative for chest pain, palpitations, diaphoresis, dyspnea and peripheral edema. ; ; Respiratory: +cough. Negative for wheezing and stridor. ; ; Gastrointestinal: Negative for nausea, vomiting, diarrhea, abdominal pain, blood in stool, hematemesis, jaundice and rectal bleeding. . ; ; Genitourinary: Negative for dysuria, flank pain and hematuria. ; ; Musculoskeletal: Negative for back pain and neck pain. Negative for swelling and trauma.; ; Skin: Negative for pruritus, rash, abrasions, blisters, bruising and skin lesion.; ; Neuro: Negative for headache, lightheadedness and neck stiffness. Negative for  weakness, altered level of consciousness , altered mental status, extremity weakness, paresthesias, involuntary movement, seizure and syncope.      Allergies  Penicillins  Home Medications   Prior to Admission medications   Medication Sig Start Date End Date Taking? Authorizing Provider  amLODipine (NORVASC) 10 MG tablet Take 10 mg by mouth daily.   Yes Historical Provider, MD  aspirin EC 81 MG tablet Take 81 mg by mouth at bedtime.    Yes Historical Provider, MD  olmesartan (BENICAR) 40 MG tablet Take 40 mg by mouth daily.   Yes Historical Provider, MD  cephALEXin (KEFLEX) 500 MG capsule Take 500 mg by mouth 3 (three) times daily. 7 day course starting on 08/24/2013 08/24/13   Historical Provider, MD   BP 170/72  Pulse 77  Temp(Src) 98.5 F (36.9 C) (Oral)  Resp 18  SpO2 95% Physical Exam 1715: Physical examination:  Nursing notes reviewed; Vital signs and O2 SAT reviewed;  Constitutional: Well developed, Well nourished, Well hydrated, In no acute distress; Head:  Normocephalic, atraumatic; Eyes: EOMI, PERRL, No scleral icterus; ENMT: Mouth and pharynx normal, Mucous membranes moist; Neck: Supple, Full range of motion, No lymphadenopathy; Cardiovascular: Regular rate and rhythm, No gallop; Respiratory: Breath sounds coarse & equal bilaterally, No wheezes.  Speaking full sentences with ease, Normal respiratory effort/excursion; Chest: Nontender, Movement normal; Abdomen: Soft, Nontender, Nondistended, Normal bowel sounds; Genitourinary: No CVA tenderness; Extremities: Pulses normal, No tenderness, No edema, No calf edema or asymmetry.; Neuro: AA&Ox3, +right  eye enucleation per hx, otherwise major CN grossly intact.  Speech clear. No gross focal motor or sensory deficits in extremities. Climbs on and off stretcher easily by herself. Gait steady.; Skin: Color normal, Warm, Dry.   ED Course  Procedures     EKG Interpretation   Date/Time:  Saturday September 04 2013 17:58:02 EDT Ventricular  Rate:  71 PR Interval:  270 QRS Duration: 77 QT Interval:  421 QTC Calculation: 457 R Axis:   117 Text Interpretation:  Sinus rhythm Prolonged PR interval Anterolateral  infarct, old No old tracing to compare Confirmed by [facility]  MD, Nunzio Cory  [phone]) on 09/04/2013 6:55:37 PM      MDM  MDM Reviewed: previous chart, vitals and nursing note Reviewed previous: labs and ECG Interpretation: labs, ECG and x-ray     Results for orders placed during the hospital encounter of 50/53/97  BASIC METABOLIC PANEL      Result Value Ref Range   Sodium 142  137 - 147 mEq/L   Potassium 3.6 (*) 3.7 - 5.3 mEq/L   Chloride 103  96 - 112 mEq/L   CO2 29  19 - 32 mEq/L   Glucose, Bld 91  70 - 99 mg/dL   BUN 15  6 - 23 mg/dL   Creatinine, Ser 0.65  0.50 - 1.10 mg/dL   Calcium 8.9  8.4 - 10.5 mg/dL   GFR calc non Af Amer 87 (*) >90 mL/min   GFR calc Af Amer >90  >90 mL/min   Anion gap 10  5 - 15  CBC WITH DIFFERENTIAL      Result Value Ref Range   WBC 7.5  4.0 - 10.5 K/uL   RBC 4.50  3.87 - 5.11 MIL/uL   Hemoglobin 12.9  12.0 - 15.0 g/dL   HCT 38.9  36.0 - 46.0 %   MCV 86.4  78.0 - 100.0 fL   MCH 28.7  26.0 - 34.0 pg   MCHC 33.2  30.0 - 36.0 g/dL   RDW 13.9  11.5 - 15.5 %   Platelets 246  150 - 400 K/uL   Neutrophils Relative % 61  43 - 77 %   Neutro Abs 4.6  1.7 - 7.7 K/uL   Lymphocytes Relative 25  12 - 46 %   Lymphs Abs 1.9  0.7 - 4.0 K/uL   Monocytes Relative 6  3 - 12 %   Monocytes Absolute 0.4  0.1 - 1.0 K/uL   Eosinophils Relative 8 (*) 0 - 5 %   Eosinophils Absolute 0.6  0.0 - 0.7 K/uL   Basophils Relative 0  0 - 1 %   Basophils Absolute 0.0  0.0 - 0.1 K/uL  PRO B NATRIURETIC PEPTIDE      Result Value Ref Range   Pro B Natriuretic peptide (BNP) 73.8  0 - 125 pg/mL  TROPONIN I      Result Value Ref Range   Troponin I <0.30  <0.30 ng/mL   Dg Chest 2 View 09/04/2013   CLINICAL DATA:  Weakness with pleural effusion.  EXAM: CHEST  2 VIEW  COMPARISON:  09/30/2007.  FINDINGS:  Lungs are hyperexpanded without edema. There is bibasilar interstitial and patchy airspace disease, likely chronic, but superimposed basilar infection not excluded. The cardio pericardial silhouette is enlarged. Nodular density overlying the left seventh rib attributed to the inferior tip of the scapula. Telemetry leads overlie the chest.  IMPRESSION: Cardiomegaly with underlying chronic interstitial coarsening. Bibasilar interstitial and alveolar opacity is also  likely chronic although basilar infection is not excluded.   Electronically Signed   By: Misty Stanley M.D.   On: 09/04/2013 18:38    1855:  Pt states she continues to "feel fine" and wants to go home now. Pt has been ambulatory with steady gait, easy resps, NAD. Possible basilar infection on CXR; will tx with doxycycline. Dx and testing d/w pt.  Questions answered.  Verb understanding, agreeable to d/c home with outpt f/u.   Alfonzo Feller, DO 09/06/13 2031

## 2013-10-26 ENCOUNTER — Encounter (HOSPITAL_COMMUNITY): Payer: Self-pay | Admitting: Emergency Medicine

## 2013-10-26 ENCOUNTER — Emergency Department (HOSPITAL_COMMUNITY): Payer: PRIVATE HEALTH INSURANCE

## 2013-10-26 ENCOUNTER — Emergency Department (HOSPITAL_COMMUNITY)
Admission: EM | Admit: 2013-10-26 | Discharge: 2013-10-26 | Disposition: A | Discharge status: Home / Self Care | Payer: PRIVATE HEALTH INSURANCE | Attending: Emergency Medicine | Admitting: Emergency Medicine

## 2013-10-26 DIAGNOSIS — Z7982 Long term (current) use of aspirin: Secondary | ICD-10-CM | POA: Diagnosis not present

## 2013-10-26 DIAGNOSIS — Z792 Long term (current) use of antibiotics: Secondary | ICD-10-CM | POA: Insufficient documentation

## 2013-10-26 DIAGNOSIS — Z8709 Personal history of other diseases of the respiratory system: Secondary | ICD-10-CM | POA: Diagnosis not present

## 2013-10-26 DIAGNOSIS — F17200 Nicotine dependence, unspecified, uncomplicated: Secondary | ICD-10-CM | POA: Diagnosis not present

## 2013-10-26 DIAGNOSIS — Z880 Allergy status to penicillin: Secondary | ICD-10-CM | POA: Insufficient documentation

## 2013-10-26 DIAGNOSIS — I10 Essential (primary) hypertension: Secondary | ICD-10-CM | POA: Insufficient documentation

## 2013-10-26 DIAGNOSIS — IMO0002 Reserved for concepts with insufficient information to code with codable children: Secondary | ICD-10-CM | POA: Insufficient documentation

## 2013-10-26 DIAGNOSIS — Y929 Unspecified place or not applicable: Secondary | ICD-10-CM | POA: Insufficient documentation

## 2013-10-26 DIAGNOSIS — Y9389 Activity, other specified: Secondary | ICD-10-CM | POA: Diagnosis not present

## 2013-10-26 DIAGNOSIS — Z79899 Other long term (current) drug therapy: Secondary | ICD-10-CM | POA: Diagnosis not present

## 2013-10-26 DIAGNOSIS — T17208A Unspecified foreign body in pharynx causing other injury, initial encounter: Secondary | ICD-10-CM | POA: Diagnosis not present

## 2013-10-26 MED ORDER — BENZOCAINE 20 % MT SOLN
Freq: Once | OROMUCOSAL | Status: AC
  Administered 2013-10-26: 1 via OROMUCOSAL
  Filled 2013-10-26: qty 57

## 2013-10-26 NOTE — ED Provider Notes (Signed)
Medical screening examination/treatment/procedure(s) were performed by non-physician practitioner and as supervising physician I was immediately available for consultation/collaboration.   EKG Interpretation None        Sharyon Cable, MD 10/26/13 1209

## 2013-10-26 NOTE — ED Provider Notes (Signed)
CSN: [id number]     Arrival date & time 10/26/13  1018 History   First MD Initiated Contact with Patient 10/26/13 1026     No chief complaint on file.    (Consider location/radiation/quality/duration/timing/severity/associated sxs/prior Treatment) HPI Comments: Patient is a 73 year old female who presents to the emergency department with a complaint of" a piece of bacon is in my throat". The patient states that she was in her usual state of health when she was eating breakfast this morning which consisted of bacon and aches. The patient states that a piece of bacon" got stuck in her throat and she thinks may have gone in the root down the wrong way". The patient states she had some shortness of breath for short period of time, she does not have shortness of breath now and she was coughing on her way here to the emergency department. She denies any shortness of breath. She is not having any coughing at this time. His been no previous operations or procedures involving the neck or throat area. The patient has not taken any medications for this problem.  The history is provided by the patient.    Past Medical History  Diagnosis Date  . Hypertension   . Bronchitis    Past Surgical History  Procedure Laterality Date  . Enucleation     No family history on file. History  Substance Use Topics  . Smoking status: Current Every Day Smoker -- 0.50 packs/day    Types: Cigarettes  . Smokeless tobacco: Not on file  . Alcohol Use: No     Comment: rarely   OB History   Grav Para Term Preterm Abortions TAB SAB Ect Mult Living                 Review of Systems  Constitutional: Negative for activity change.       All ROS Neg except as noted in HPI  HENT: Negative for sore throat and trouble swallowing.   Eyes: Negative for photophobia and discharge.  Respiratory: Negative for cough, shortness of breath and wheezing.   Cardiovascular: Negative for chest pain and palpitations.   Gastrointestinal: Negative for abdominal pain and blood in stool.  Genitourinary: Negative for dysuria, frequency and hematuria.  Musculoskeletal: Negative for arthralgias, back pain and neck pain.  Skin: Negative.   Neurological: Negative for dizziness, seizures and speech difficulty.  Psychiatric/Behavioral: Negative.       Allergies  Penicillins  Home Medications   Prior to Admission medications   Medication Sig Start Date End Date Taking? Authorizing Provider  amLODipine (NORVASC) 10 MG tablet Take 10 mg by mouth daily.    Historical Provider, MD  aspirin EC 81 MG tablet Take 81 mg by mouth at bedtime.     Historical Provider, MD  cephALEXin (KEFLEX) 500 MG capsule Take 500 mg by mouth 3 (three) times daily. 7 day course starting on 08/24/2013 08/24/13   Historical Provider, MD  doxycycline (VIBRA-TABS) 100 MG tablet Take 1 tablet (100 mg total) by mouth 2 (two) times daily. 09/04/13   Francine Graven, DO  olmesartan (BENICAR) 40 MG tablet Take 40 mg by mouth daily.    Historical Provider, MD   BP 158/68  Pulse 72  Temp(Src) 97.8 F (36.6 C) (Oral)  Resp 18  Ht 5' 3.5" (1.613 m)  Wt 176 lb (79.833 kg)  BMI 30.68 kg/m2  SpO2 96% Physical Exam  Nursing note and vitals reviewed. Constitutional: She is oriented to person, place, and time. She appears  well-developed and well-nourished.  Non-toxic appearance.  HENT:  Head: Normocephalic.  Right Ear: Tympanic membrane and external ear normal.  Left Ear: Tympanic membrane and external ear normal.  Mouth/Throat: Oropharynx is clear and moist.  No fb seen on limited exam.  Speech clear and understandable.  Eyes: EOM and lids are normal. Pupils are equal, round, and reactive to light.  Neck: Normal range of motion. Neck supple. Carotid bruit is not present. No tracheal deviation present.  Cardiovascular: Normal rate, regular rhythm, normal heart sounds, intact distal pulses and normal pulses.   Pulmonary/Chest: Effort normal and  breath sounds normal. No stridor. No respiratory distress. She has no wheezes. She has no rales.  Abdominal: Soft. Bowel sounds are normal. There is no tenderness. There is no guarding.  Musculoskeletal: Normal range of motion.  Lymphadenopathy:       Head (right side): No submandibular adenopathy present.       Head (left side): No submandibular adenopathy present.    She has no cervical adenopathy.  Neurological: She is alert and oriented to person, place, and time. She has normal strength. No cranial nerve deficit or sensory deficit.  Skin: Skin is warm and dry.  Psychiatric: She has a normal mood and affect. Her speech is normal.    ED Course  Procedures (including critical care time) Labs Review Labs Reviewed - No data to display  Imaging Review No results found.   EKG Interpretation None      MDM  Patient's throat sprayed with one spray  Of Hurricaine spray. Patient allows a much better exam. No foreign body appreciated. The uvula is in the midline. There is no swelling or redness of the posterior pharynx. Speech remains clear.  Vital signs are within normal limits. Pulse oximetry is 96% on room air. Within normal limits by my interpretation.  Chest x-ray is normal for evidence of aspiration. Patient is resting comfortably in no distress. Speech remains clear on recheck. Patient states she is ready to go home. Feel that it is safe for the patient to return home. I have cautioned the patient to chew her food carefully and to use small bites. I've also cautioned patient not to eat or drink over the next 2 hours and she has had the Hurricaine spray administered. The patient acknowledges understanding of these discharge instructions.    Final diagnoses:  None    *I have reviewed nursing notes, vital signs, and all appropriate lab and imaging results for this patient.Lenox Ahr, PA-C 10/26/13 1156

## 2013-10-26 NOTE — Discharge Instructions (Signed)
Please return if any changes or problem. Your exam and xray are negative for acute problem at this time

## 2013-10-26 NOTE — ED Notes (Signed)
PT feels like a piece of bacon from breakfast is stuck in her throat. PT c/o some pain to throat. No SOB noted.

## 2014-01-17 ENCOUNTER — Encounter (HOSPITAL_COMMUNITY): Payer: Self-pay | Admitting: Cardiology

## 2014-01-17 ENCOUNTER — Emergency Department (HOSPITAL_COMMUNITY)
Admission: EM | Admit: 2014-01-17 | Discharge: 2014-01-17 | Disposition: A | Discharge status: Home / Self Care | Payer: PRIVATE HEALTH INSURANCE | Attending: Emergency Medicine | Admitting: Emergency Medicine

## 2014-01-17 DIAGNOSIS — H00012 Hordeolum externum right lower eyelid: Secondary | ICD-10-CM | POA: Diagnosis not present

## 2014-01-17 DIAGNOSIS — H5711 Ocular pain, right eye: Secondary | ICD-10-CM | POA: Diagnosis present

## 2014-01-17 DIAGNOSIS — Z7982 Long term (current) use of aspirin: Secondary | ICD-10-CM | POA: Diagnosis not present

## 2014-01-17 DIAGNOSIS — Z79899 Other long term (current) drug therapy: Secondary | ICD-10-CM | POA: Insufficient documentation

## 2014-01-17 DIAGNOSIS — I10 Essential (primary) hypertension: Secondary | ICD-10-CM | POA: Diagnosis not present

## 2014-01-17 DIAGNOSIS — H00013 Hordeolum externum right eye, unspecified eyelid: Secondary | ICD-10-CM

## 2014-01-17 DIAGNOSIS — Z8709 Personal history of other diseases of the respiratory system: Secondary | ICD-10-CM | POA: Insufficient documentation

## 2014-01-17 DIAGNOSIS — Z880 Allergy status to penicillin: Secondary | ICD-10-CM | POA: Diagnosis not present

## 2014-01-17 DIAGNOSIS — Z720 Tobacco use: Secondary | ICD-10-CM | POA: Insufficient documentation

## 2014-01-17 MED ORDER — ERYTHROMYCIN 5 MG/GM OP OINT
TOPICAL_OINTMENT | Freq: Once | OPHTHALMIC | Status: AC
  Administered 2014-01-17: 1 via OPHTHALMIC
  Filled 2014-01-17: qty 3.5

## 2014-01-17 MED ORDER — TETRACAINE HCL 0.5 % OP SOLN: 2.0000 [drp] | Freq: Once | OPHTHALMIC | Status: DC

## 2014-01-17 NOTE — ED Notes (Signed)
Patient with no complaints at this time. Respirations even and unlabored. Skin warm/dry. Discharge instructions reviewed with patient at this time. Patient given opportunity to voice concerns/ask questions. Patient discharged at this time and left Emergency Department with steady gait.

## 2014-01-17 NOTE — Discharge Instructions (Signed)

## 2014-01-17 NOTE — ED Notes (Signed)
PT c/o right eye swelling, pain, itching x2 days. PT denies any drainage to eye.

## 2014-01-17 NOTE — ED Notes (Signed)
Right eye swelling since Saturday.

## 2014-01-19 NOTE — ED Provider Notes (Signed)
CSN: [id number]     Arrival date & time 01/17/14  9518 History   First MD Initiated Contact with Patient 01/17/14 1023     Chief Complaint  Patient presents with  . Eye Problem     (Consider location/radiation/quality/duration/timing/severity/associated sxs/prior Treatment) HPI   Marisa Bailey is a 73 y.o. female who presents to the Emergency Department complaining of pain and redness to the inside right lower eyelid for 2 days.  Reports occasional itching to the lid as well.  She has been applying warm salt water w/o relief.  Pain worse with blinking.  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  She denies visual changes on the left, facial pain or weakness, dizziness or headache.     Past Medical History  Diagnosis Date  . Hypertension   . Bronchitis    Past Surgical History  Procedure Laterality Date  . Enucleation     History reviewed. No pertinent family history. History  Substance Use Topics  . Smoking status: Current Every Day Smoker -- 0.50 packs/day    Types: Cigarettes  . Smokeless tobacco: Not on file  . Alcohol Use: No     Comment: rarely   OB History    No data available     Review of Systems  Constitutional: Negative for fever and chills.  Eyes: Positive for pain, redness and itching. Negative for discharge and visual disturbance.  Respiratory: Negative for shortness of breath.   Cardiovascular: Negative for chest pain.  Gastrointestinal: Negative for nausea and vomiting.  Skin: Negative for rash.  Neurological: Negative for dizziness, speech difficulty, weakness, light-headedness, numbness and headaches.  All other systems reviewed and are negative.     Allergies  Penicillins  Home Medications   Prior to Admission medications   Medication Sig Start Date End Date Taking? Authorizing Provider  amLODipine (NORVASC) 10 MG tablet Take 10 mg by mouth daily.   Yes Historical Provider, MD   aspirin EC 81 MG tablet Take 81 mg by mouth at bedtime.    Yes Historical Provider, MD  olmesartan (BENICAR) 40 MG tablet Take 40 mg by mouth daily.   Yes Historical Provider, MD   BP 143/58 mmHg  Pulse 65  Temp(Src) 98.1 F (36.7 C) (Oral)  Resp 18  Ht _0  (1.626 m)  Wt 176 lb (79.833 kg)  BMI 30.20 kg/m2  SpO2 95% Physical Exam  Constitutional: She is oriented to person, place, and time. She appears well-developed and well-nourished. No distress.  HENT:  Head: Normocephalic and atraumatic.  Eyes: EOM are normal. Pupils are equal, round, and reactive to light. Lids are everted and swept, no foreign bodies found. Right conjunctiva is not injected. Left conjunctiva is not injected.  Stye present to right medial lower lid.  Pt has a prosthetic eye.  No facial pain , edema or erythema  Neck: Normal range of motion. Neck supple. No thyromegaly present.  Cardiovascular: Normal rate, regular rhythm, normal heart sounds and intact distal pulses.   No murmur heard. Pulmonary/Chest: Effort normal and breath sounds normal. No respiratory distress. She exhibits no tenderness.  Musculoskeletal: Normal range of motion.  Lymphadenopathy:    She has no cervical adenopathy.  Neurological: She is alert and oriented to person, place, and time. She exhibits normal muscle tone. Coordination normal.  Skin: Skin is warm and dry. No rash noted.  Psychiatric: She has a normal mood and affect.  Nursing  note and vitals reviewed.   ED Course  Procedures (including critical care time) Labs Review Labs Reviewed - No data to display  Imaging Review No results found.   EKG Interpretation None      MDM   Final diagnoses:  Hordeolum externum (stye), right      Visual Acuity  Right Eye Distance:  (PT has artifical right eye) Left Eye Distance: 20/30 Bilateral Distance: 20/30  Right Eye Near:   Left Eye Near:    Bilateral Near:      Erythromycin ointment applied and dispensed.  Pt  agrees to frequent warm compresses to her eye.  She agrees to close f/u with her ophthalmologist or to return here if needed.  Stable for d/c  Emil Klassen L. Vanessa [place], PA-C 01/19/14 2053  Francine Graven, DO 01/19/14 [phone]

## 2014-07-06 ENCOUNTER — Other Ambulatory Visit (HOSPITAL_COMMUNITY): Payer: Self-pay | Admitting: Family Medicine

## 2014-07-06 DIAGNOSIS — Z1231 Encounter for screening mammogram for malignant neoplasm of breast: Secondary | ICD-10-CM

## 2014-08-01 ENCOUNTER — Ambulatory Visit (HOSPITAL_COMMUNITY)
Admission: RE | Admit: 2014-08-01 | Discharge: 2014-08-01 | Disposition: A | Discharge status: Home / Self Care | Payer: Medicare Other | Source: Ambulatory Visit | Attending: Family Medicine | Admitting: Family Medicine

## 2014-08-01 ENCOUNTER — Other Ambulatory Visit (HOSPITAL_COMMUNITY): Payer: Self-pay | Admitting: Family Medicine

## 2014-08-01 DIAGNOSIS — Z1231 Encounter for screening mammogram for malignant neoplasm of breast: Secondary | ICD-10-CM | POA: Diagnosis not present

## 2014-08-03 ENCOUNTER — Other Ambulatory Visit: Payer: Self-pay | Admitting: Family Medicine

## 2014-08-03 DIAGNOSIS — R928 Other abnormal and inconclusive findings on diagnostic imaging of breast: Secondary | ICD-10-CM

## 2014-08-23 ENCOUNTER — Ambulatory Visit (HOSPITAL_COMMUNITY)
Admission: RE | Admit: 2014-08-23 | Discharge: 2014-08-23 | Disposition: A | Discharge status: Home / Self Care | Payer: Medicare Other | Source: Ambulatory Visit | Attending: Family Medicine | Admitting: Family Medicine

## 2014-08-23 ENCOUNTER — Inpatient Hospital Stay (HOSPITAL_COMMUNITY): Admission: RE | Admit: 2014-08-23 | Payer: Medicare Other | Source: Ambulatory Visit

## 2014-08-23 DIAGNOSIS — R928 Other abnormal and inconclusive findings on diagnostic imaging of breast: Secondary | ICD-10-CM

## 2014-09-06 ENCOUNTER — Ambulatory Visit (HOSPITAL_COMMUNITY)
Admission: RE | Admit: 2014-09-06 | Discharge: 2014-09-06 | Disposition: A | Discharge status: Home / Self Care | Payer: Medicare Other | Source: Ambulatory Visit | Attending: Family Medicine | Admitting: Family Medicine

## 2014-09-06 ENCOUNTER — Other Ambulatory Visit (HOSPITAL_COMMUNITY): Payer: Self-pay | Admitting: Family Medicine

## 2014-09-06 ENCOUNTER — Encounter (HOSPITAL_COMMUNITY): Payer: Self-pay

## 2014-09-06 ENCOUNTER — Other Ambulatory Visit: Payer: Self-pay | Admitting: Family Medicine

## 2014-09-06 DIAGNOSIS — N6310 Unspecified lump in the right breast, unspecified quadrant: Secondary | ICD-10-CM

## 2014-09-06 DIAGNOSIS — N63 Unspecified lump in breast: Secondary | ICD-10-CM | POA: Insufficient documentation

## 2014-09-06 DIAGNOSIS — R928 Other abnormal and inconclusive findings on diagnostic imaging of breast: Secondary | ICD-10-CM

## 2014-09-06 MED ORDER — LIDOCAINE-EPINEPHRINE 1 %-1:200000 IJ SOLN
INTRAMUSCULAR | Status: AC
Start: 2014-09-06 — End: 2014-09-06
  Administered 2014-09-06: 10 mL
  Filled 2014-09-06: qty 10

## 2014-09-06 MED ORDER — SODIUM BICARBONATE 4 % IV SOLN
INTRAVENOUS | Status: AC
  Administered 2014-09-06: 1 mL
  Filled 2014-09-06: qty 5

## 2014-09-06 NOTE — Discharge Instructions (Signed)
Breast Biopsy Care After These instructions give you information on caring for yourself after your procedure. Your doctor may also give you more specific instructions. Call your doctor if you have any problems or questions after your procedure. HOME CARE  Only take medicine as told by your doctor.  Do not take aspirin.  Keep your sutures (stitches) dry when bathing.  Protect the biopsy area. Do not let the area get bumped.  Avoid activities that could pull the biopsy site open until your doctor approves. This includes:  Stretching.  Reaching.  Exercise.  Sports.  Lifting more than 3lb.  Continue your normal diet.  Wear a good support bra for as long as told by your doctor.  Change any bandages (dressings) as told by your doctor.  Do not drink alcohol while taking pain medicine.  Keep all doctor visits as told. Ask when your test results will be ready. Make sure you get your test results. GET HELP RIGHT AWAY IF:   You have a fever.  You have more bleeding (more than a small spot) from the biopsy site.  You have trouble breathing.  You have yellowish-white fluid (pus) coming from the biopsy site.  You have redness, puffiness (swelling), or more pain in the biopsy site.  You have a bad smell coming from the biopsy site.  Your biopsy site opens after sutures, staples, or sticky strips have been removed.  You have a rash.  You need stronger medicine. MAKE SURE YOU:  Understand these instructions.  Will watch your condition.  Will get help right away if you are not doing well or get worse. Document Released: 11/24/2008 Document Revised: 04/22/2011 Document Reviewed: 03/10/2011 [facility] Patient Information 2015 [place], [place]. This information is not intended to replace advice given to you by your health care provider. Make sure you discuss any questions you have with your health care provider.  Breast Biopsy A breast biopsy is a test during which a sample of  tissue is taken from your breast. The breast tissue is looked at under a microscope for cancer cells.  BEFORE THE PROCEDURE  Make plans to have someone drive you home after the test.  Do not smoke for 2 weeks before the test. Stop smoking, if you smoke.  Do not drink alcohol for 24 hours before the test.  Wear a good support bra to the test. PROCEDURE  You may be given one of the following:  A medicine to numb the breast area (local anesthetic).  A medicine to make you fall asleep (general anesthetic). There are different types of breast biopsies. They include:  Fine-needle aspiration.  A needle is put into the breast lump.  The needle takes out fluid and cells from the lump.  Ultrasound imaging may be used to help find the lump and to put the needle in the right spot.  Core-needle biopsy.  A needle is put into the breast lump.  The needle is put in your breast 3-6 times.  The needle removes breast tissue.  An ultrasound image or X-ray is often used to find the right spot to put in the needle.  Stereotactic biopsy.  X-rays and a computer are used to study X-ray pictures of the breast lump.  The computer finds where the needle needs to be put into the breast.  Tissue samples are taken out.  Vacuum-assisted biopsy.  A small cut (incision) is made in your breast.  A biopsy device is put through the cut and into the breast  tissue.  The biopsy device draws abnormal breast tissue into the biopsy device.  A large tissue sample is often removed.  No stitches are needed.  Ultrasound-guided core-needle biopsy.  Ultrasound imaging helps guide the needle into the area of the breast that is not normal.  A cut is made in the breast. The needle is put into the breast lump.  Tissue samples are taken out.  Open biopsy.  A large cut is made in the breast.  Your doctor will try to remove the whole breast lump or as much as possible. All tissue, fluid, or cell samples  are looked at under a microscope.  AFTER THE PROCEDURE  You will be taken to an area to recover. You will be able to go home once you are doing well and are without problems.  You may have bruising on your breast. This is normal.  A pressure bandage (dressing) may be put on your breast for 24-48 hours. This type of bandage is wrapped tightly around your chest. It helps stop fluid from building up underneath tissues. Document Released: 04/22/2011 Document Revised: 06/14/2013 Document Reviewed: 04/22/2011 [facility] Patient Information 2015 [place], [place]. This information is not intended to replace advice given to you by your health care provider. Make sure you discuss any questions you have with your health care provider.

## 2016-06-12 ENCOUNTER — Emergency Department (HOSPITAL_COMMUNITY)
Admission: EM | Admit: 2016-06-12 | Discharge: 2016-06-12 | Disposition: A | Discharge status: Home Health | Payer: Medicare Other | Attending: Emergency Medicine | Admitting: Emergency Medicine

## 2016-06-12 ENCOUNTER — Emergency Department (HOSPITAL_COMMUNITY): Payer: Medicare Other

## 2016-06-12 ENCOUNTER — Encounter (HOSPITAL_COMMUNITY): Payer: Self-pay | Admitting: Emergency Medicine

## 2016-06-12 DIAGNOSIS — F17210 Nicotine dependence, cigarettes, uncomplicated: Secondary | ICD-10-CM | POA: Diagnosis not present

## 2016-06-12 DIAGNOSIS — Z7982 Long term (current) use of aspirin: Secondary | ICD-10-CM | POA: Insufficient documentation

## 2016-06-12 DIAGNOSIS — R05 Cough: Secondary | ICD-10-CM | POA: Diagnosis present

## 2016-06-12 DIAGNOSIS — J441 Chronic obstructive pulmonary disease with (acute) exacerbation: Secondary | ICD-10-CM | POA: Diagnosis not present

## 2016-06-12 DIAGNOSIS — J439 Emphysema, unspecified: Secondary | ICD-10-CM

## 2016-06-12 DIAGNOSIS — Z79899 Other long term (current) drug therapy: Secondary | ICD-10-CM | POA: Insufficient documentation

## 2016-06-12 DIAGNOSIS — I10 Essential (primary) hypertension: Secondary | ICD-10-CM | POA: Diagnosis not present

## 2016-06-12 MED ORDER — IPRATROPIUM-ALBUTEROL 0.5-2.5 (3) MG/3ML IN SOLN
3.0000 mL | Freq: Once | RESPIRATORY_TRACT | Status: AC
  Administered 2016-06-12: 3 mL via RESPIRATORY_TRACT
  Filled 2016-06-12: qty 3

## 2016-06-12 MED ORDER — DOXYCYCLINE HYCLATE 100 MG PO CAPS: 100.0000 mg | ORAL_CAPSULE | Freq: Two times a day (BID) | ORAL | 0 refills | Status: DC

## 2016-06-12 MED ORDER — ALBUTEROL SULFATE (2.5 MG/3ML) 0.083% IN NEBU
2.5000 mg | INHALATION_SOLUTION | Freq: Once | RESPIRATORY_TRACT | Status: AC
  Administered 2016-06-12: 2.5 mg via RESPIRATORY_TRACT
  Filled 2016-06-12: qty 3

## 2016-06-12 MED ORDER — DOXYCYCLINE HYCLATE 100 MG PO TABS
100.0000 mg | ORAL_TABLET | Freq: Once | ORAL | Status: AC
  Administered 2016-06-12: 100 mg via ORAL
  Filled 2016-06-12: qty 1

## 2016-06-12 MED ORDER — BENZONATATE 100 MG PO CAPS: 100.0000 mg | ORAL_CAPSULE | Freq: Three times a day (TID) | ORAL | 0 refills | Status: DC

## 2016-06-12 MED ORDER — ALBUTEROL SULFATE HFA 108 (90 BASE) MCG/ACT IN AERS
2.0000 | INHALATION_SPRAY | Freq: Once | RESPIRATORY_TRACT | Status: AC
Start: 2016-06-12 — End: 2016-06-12
  Administered 2016-06-12: 2 via RESPIRATORY_TRACT
  Filled 2016-06-12: qty 6.7

## 2016-06-12 MED ORDER — DEXAMETHASONE SODIUM PHOSPHATE 4 MG/ML IJ SOLN
8.0000 mg | Freq: Once | INTRAMUSCULAR | Status: AC
  Administered 2016-06-12: 8 mg via INTRAMUSCULAR
  Filled 2016-06-12: qty 2

## 2016-06-12 NOTE — ED Provider Notes (Signed)
[facility] DEPT Provider Note   CSN: [id number] Arrival date & time: 06/12/16  1120     History   Chief Complaint Chief Complaint  Patient presents with  . Cough    HPI Marisa Bailey is a 76 y.o. female.  Patient is a 76 year old female who presents to the emergency department with a complaint of cough.  The patient states that over the past 3 or 4 days she's been having increasing cough. She is also noted some sneezing. She is not coughing up any blood. She has not had any fever or chills that she is aware of. She does not recall doing any strenuous activities. She's not had any chest pain. His been no recent operations or procedures reported. The patient reports that nothing makes this cough any better, and nothing seems to make it any worse.      Past Medical History:  Diagnosis Date  . Bronchitis   . Hypertension     Patient Active Problem List   Diagnosis Date Noted  . Muscle weakness (generalized) 08/17/2012  . Pain in joint, shoulder region 08/17/2012  . Fx upper humerus-closed 08/17/2012    Past Surgical History:  Procedure Laterality Date  . ENUCLEATION      OB History    No data available       Home Medications    Prior to Admission medications   Medication Sig Start Date End Date Taking? Authorizing Provider  amLODipine (NORVASC) 10 MG tablet Take 10 mg by mouth daily.    Historical Provider, MD  aspirin EC 81 MG tablet Take 81 mg by mouth at bedtime.     Historical Provider, MD  olmesartan (BENICAR) 40 MG tablet Take 40 mg by mouth daily.    Historical Provider, MD    Family History History reviewed. No pertinent family history.  Social History Social History  Substance Use Topics  . Smoking status: Current Every Day Smoker    Packs/day: 0.50    Types: Cigarettes  . Smokeless tobacco: Never Used  . Alcohol use No     Comment: rarely     Allergies   Penicillins   Review of Systems Review of Systems  HENT: Positive for  congestion and sneezing.        Difficulty hearing  Respiratory: Positive for cough and wheezing.   Musculoskeletal: Positive for arthralgias.  All other systems reviewed and are negative.    Physical Exam Updated Vital Signs BP (!) 149/77 (BP Location: Right Arm)   Pulse 75   Temp 98.8 F (37.1 C) (Oral)   Resp 20   Ht _0  (1.626 m)   Wt 80.7 kg   SpO2 93%   BMI 30.55 kg/m   Physical Exam  Constitutional: She is oriented to person, place, and time. She appears well-developed and well-nourished.  Non-toxic appearance.  HENT:  Head: Normocephalic.  Right Ear: Tympanic membrane and external ear normal.  Left Ear: Tympanic membrane and external ear normal.  Eyes: EOM and lids are normal. Pupils are equal, round, and reactive to light.  Neck: Normal range of motion. Neck supple. Carotid bruit is not present.  Cardiovascular: Normal rate, regular rhythm, normal heart sounds, intact distal pulses and normal pulses.   Pulmonary/Chest: No respiratory distress. She has wheezes. She has rhonchi.  Pt speaks in complete sentences without problem.  Abdominal: Soft. Bowel sounds are normal. There is no tenderness. There is no guarding.  Musculoskeletal: Normal range of motion.  1+ pitting edema of the  lower extremities.  Lymphadenopathy:       Head (right side): No submandibular adenopathy present.       Head (left side): No submandibular adenopathy present.    She has no cervical adenopathy.  Neurological: She is alert and oriented to person, place, and time. She has normal strength. No cranial nerve deficit or sensory deficit.  Pt ambulatory without problem.  Skin: Skin is warm and dry.  Psychiatric: She has a normal mood and affect. Her speech is normal.  Nursing note and vitals reviewed.    ED Treatments / Results  Labs (all labs ordered are listed, but only abnormal results are displayed) Labs Reviewed - No data to display  EKG  EKG Interpretation None        Radiology No results found.  Procedures Procedures (including critical care time)  Medications Ordered in ED Medications  ipratropium-albuterol (DUONEB) 0.5-2.5 (3) MG/3ML nebulizer solution 3 mL (not administered)  albuterol (PROVENTIL) (2.5 MG/3ML) 0.083% nebulizer solution 2.5 mg (not administered)     Initial Impression / Assessment and Plan / ED Course  I have reviewed the triage vital signs and the nursing notes.  Pertinent labs & imaging results that were available during my care of the patient were reviewed by me and considered in my medical decision making (see chart for details).      Final Clinical Impressions(s) / ED Diagnoses MDM Vital signs stable. PUlse ox 92-93. Patient states that her breathing feels better after the albuterol treatment. Recheck. Patient wheezing has improved, not completely resolved. Patient speaks in complete sentences. She is ambulatory without problem.  Chest x-ray suggest emphysema, no pneumonia or other acute problem.  The plan at this time is for the patient to be treated with albuterol inhaler 2 puffs every 4 hours, Tessalon Perles, and doxycycline 2 times daily. Patient is to follow-up with Dr. Lorriane Shire in the office for recheck. Patient is in agreement with this plan.    Final diagnoses:  Acute exacerbation of emphysema [facility])    New Prescriptions New Prescriptions   No medications on file     Lily Kocher, PA-C 06/12/16 1332    Nat Christen, MD 06/15/16 [phone]

## 2016-06-12 NOTE — ED Triage Notes (Signed)
Pt c/o non prod cough and sneezing started this am. Denies sob. nad. Denies pain

## 2016-06-12 NOTE — Discharge Instructions (Signed)
Your vital signs are within normal limits. Your chest x-ray shows that you have an exacerbation of your emphysema. Please use 2 puffs of albuterol every 4 hours. Use Tessalon Perles four cough 3 times daily. Use doxycycline 3 times daily with food. Please see Dr. Lorriane Shire for recheck in the office as soon as possible. Please return to the emergency department if any emergent changes, problems, or concerns.

## 2016-06-12 NOTE — ED Notes (Signed)
Pt states that she has medicaid and will take the medication to the pharmacy to get filled.  Pt requesting to be discharged.

## 2016-08-27 DIAGNOSIS — H25012 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, left eye: Secondary | ICD-10-CM | POA: Insufficient documentation

## 2017-06-27 ENCOUNTER — Encounter (HOSPITAL_COMMUNITY): Payer: Self-pay | Admitting: Emergency Medicine

## 2017-06-27 ENCOUNTER — Other Ambulatory Visit: Payer: Self-pay

## 2017-06-27 ENCOUNTER — Emergency Department (HOSPITAL_COMMUNITY): Payer: Medicare Other

## 2017-06-27 ENCOUNTER — Inpatient Hospital Stay (HOSPITAL_COMMUNITY)
Admission: EM | Admit: 2017-06-27 | Discharge: 2017-06-30 | DRG: 190 | Disposition: A | Discharge status: Home / Self Care | Payer: Medicare Other | Attending: Family Medicine | Admitting: Family Medicine

## 2017-06-27 DIAGNOSIS — Z720 Tobacco use: Secondary | ICD-10-CM

## 2017-06-27 DIAGNOSIS — J441 Chronic obstructive pulmonary disease with (acute) exacerbation: Principal | ICD-10-CM | POA: Diagnosis present

## 2017-06-27 DIAGNOSIS — Z79899 Other long term (current) drug therapy: Secondary | ICD-10-CM

## 2017-06-27 DIAGNOSIS — M6281 Muscle weakness (generalized): Secondary | ICD-10-CM

## 2017-06-27 DIAGNOSIS — I361 Nonrheumatic tricuspid (valve) insufficiency: Secondary | ICD-10-CM | POA: Diagnosis not present

## 2017-06-27 DIAGNOSIS — I5043 Acute on chronic combined systolic (congestive) and diastolic (congestive) heart failure: Secondary | ICD-10-CM | POA: Diagnosis present

## 2017-06-27 DIAGNOSIS — F17210 Nicotine dependence, cigarettes, uncomplicated: Secondary | ICD-10-CM | POA: Diagnosis present

## 2017-06-27 DIAGNOSIS — R609 Edema, unspecified: Secondary | ICD-10-CM

## 2017-06-27 DIAGNOSIS — J449 Chronic obstructive pulmonary disease, unspecified: Secondary | ICD-10-CM | POA: Diagnosis present

## 2017-06-27 DIAGNOSIS — Z880 Allergy status to penicillin: Secondary | ICD-10-CM

## 2017-06-27 DIAGNOSIS — R0902 Hypoxemia: Secondary | ICD-10-CM

## 2017-06-27 DIAGNOSIS — I10 Essential (primary) hypertension: Secondary | ICD-10-CM | POA: Diagnosis not present

## 2017-06-27 DIAGNOSIS — J9601 Acute respiratory failure with hypoxia: Secondary | ICD-10-CM | POA: Diagnosis present

## 2017-06-27 DIAGNOSIS — Z7982 Long term (current) use of aspirin: Secondary | ICD-10-CM | POA: Diagnosis not present

## 2017-06-27 DIAGNOSIS — I110 Hypertensive heart disease with heart failure: Secondary | ICD-10-CM | POA: Diagnosis present

## 2017-06-27 DIAGNOSIS — I509 Heart failure, unspecified: Secondary | ICD-10-CM

## 2017-06-27 DIAGNOSIS — M25511 Pain in right shoulder: Secondary | ICD-10-CM

## 2017-06-27 DIAGNOSIS — R0602 Shortness of breath: Secondary | ICD-10-CM | POA: Diagnosis present

## 2017-06-27 DIAGNOSIS — R188 Other ascites: Secondary | ICD-10-CM

## 2017-06-27 HISTORY — DX: Heart failure, unspecified: I50.9

## 2017-06-27 LAB — CBC WITH DIFFERENTIAL/PLATELET
Basophils Absolute: 0 10*3/uL (ref 0.0–0.1)
Basophils Relative: 0 %
Eosinophils Absolute: 0.3 10*3/uL (ref 0.0–0.7)
Eosinophils Relative: 5 %
HCT: 43.6 % (ref 36.0–46.0)
Hemoglobin: 12.6 g/dL (ref 12.0–15.0)
Lymphocytes Relative: 15 %
Lymphs Abs: 0.7 10*3/uL (ref 0.7–4.0)
MCH: 24.5 pg — ABNORMAL LOW (ref 26.0–34.0)
MCHC: 28.9 g/dL — ABNORMAL LOW (ref 30.0–36.0)
MCV: 84.7 fL (ref 78.0–100.0)
Monocytes Absolute: 0.6 10*3/uL (ref 0.1–1.0)
Monocytes Relative: 12 %
Neutro Abs: 3.2 10*3/uL (ref 1.7–7.7)
Neutrophils Relative %: 68 %
Platelets: 249 10*3/uL (ref 150–400)
RBC: 5.15 MIL/uL — ABNORMAL HIGH (ref 3.87–5.11)
RDW: 18.7 % — ABNORMAL HIGH (ref 11.5–15.5)
WBC: 4.7 10*3/uL (ref 4.0–10.5)

## 2017-06-27 LAB — URINALYSIS, ROUTINE W REFLEX MICROSCOPIC
Bilirubin Urine: NEGATIVE
Glucose, UA: NEGATIVE mg/dL
Hgb urine dipstick: NEGATIVE
Ketones, ur: NEGATIVE mg/dL
Leukocytes, UA: NEGATIVE
Nitrite: NEGATIVE
Protein, ur: NEGATIVE mg/dL
Specific Gravity, Urine: 1.011 (ref 1.005–1.030)
pH: 5 (ref 5.0–8.0)

## 2017-06-27 LAB — COMPREHENSIVE METABOLIC PANEL
ALT: 13 U/L — ABNORMAL LOW (ref 14–54)
AST: 18 U/L (ref 15–41)
Albumin: 3.4 g/dL — ABNORMAL LOW (ref 3.5–5.0)
Alkaline Phosphatase: 61 U/L (ref 38–126)
Anion gap: 8 (ref 5–15)
BUN: 16 mg/dL (ref 6–20)
CO2: 33 mmol/L — ABNORMAL HIGH (ref 22–32)
Calcium: 8.4 mg/dL — ABNORMAL LOW (ref 8.9–10.3)
Chloride: 97 mmol/L — ABNORMAL LOW (ref 101–111)
Creatinine, Ser: 0.85 mg/dL (ref 0.44–1.00)
GFR calc Af Amer: >60 mL/min (ref >60)
GFR calc non Af Amer: >60 mL/min (ref >60)
Glucose, Bld: 98 mg/dL (ref 65–99)
Potassium: 3.6 mmol/L (ref 3.5–5.1)
Sodium: 138 mmol/L (ref 135–145)
Total Bilirubin: 1 mg/dL (ref 0.3–1.2)
Total Protein: 7.5 g/dL (ref 6.5–8.1)

## 2017-06-27 LAB — TROPONIN I
Troponin I: 0.06 ng/mL (ref <0.03)
Troponin I: 0.07 ng/mL (ref <0.03)
Troponin I: 0.07 ng/mL (ref <0.03)

## 2017-06-27 LAB — I-STAT TROPONIN, ED: Troponin i, poc: 0 ng/mL (ref 0.00–0.08)

## 2017-06-27 LAB — BRAIN NATRIURETIC PEPTIDE: B Natriuretic Peptide: 359 pg/mL — ABNORMAL HIGH (ref 0.0–100.0)

## 2017-06-27 MED ORDER — ALBUTEROL (5 MG/ML) CONTINUOUS INHALATION SOLN
10.0000 mg/h | INHALATION_SOLUTION | Freq: Once | RESPIRATORY_TRACT | Status: AC
Start: 2017-06-27 — End: 2017-06-27
  Administered 2017-06-27: 10 mg/h via RESPIRATORY_TRACT
  Filled 2017-06-27: qty 20

## 2017-06-27 MED ORDER — IRBESARTAN 300 MG PO TABS
300.0000 mg | ORAL_TABLET | Freq: Every day | ORAL | Status: DC
  Administered 2017-06-28 – 2017-06-30 (×3): 300 mg via ORAL
  Filled 2017-06-27 (×3): qty 1

## 2017-06-27 MED ORDER — ONDANSETRON HCL 4 MG/2ML IJ SOLN
4.0000 mg | Freq: Four times a day (QID) | INTRAMUSCULAR | Status: DC | PRN
  Administered 2017-06-28 – 2017-06-29 (×2): 4 mg via INTRAVENOUS
  Filled 2017-06-27 (×2): qty 2

## 2017-06-27 MED ORDER — MOMETASONE FURO-FORMOTEROL FUM 200-5 MCG/ACT IN AERO
2.0000 | INHALATION_SPRAY | Freq: Two times a day (BID) | RESPIRATORY_TRACT | Status: DC
  Administered 2017-06-28 – 2017-06-30 (×5): 2 via RESPIRATORY_TRACT
  Filled 2017-06-27: qty 8.8

## 2017-06-27 MED ORDER — PNEUMOCOCCAL VAC POLYVALENT 25 MCG/0.5ML IJ INJ
0.5000 mL | INJECTION | INTRAMUSCULAR | Status: DC
  Filled 2017-06-27: qty 0.5

## 2017-06-27 MED ORDER — ASPIRIN EC 81 MG PO TBEC
81.0000 mg | DELAYED_RELEASE_TABLET | Freq: Every day | ORAL | Status: DC
  Administered 2017-06-27 – 2017-06-29 (×3): 81 mg via ORAL
  Filled 2017-06-27 (×3): qty 1

## 2017-06-27 MED ORDER — SODIUM CHLORIDE 0.9 % IV SOLN: 250.0000 mL | INTRAVENOUS | Status: DC | PRN

## 2017-06-27 MED ORDER — METHYLPREDNISOLONE SODIUM SUCC 125 MG IJ SOLR
125.0000 mg | Freq: Once | INTRAMUSCULAR | Status: AC
  Administered 2017-06-27: 125 mg via INTRAVENOUS
  Filled 2017-06-27: qty 2

## 2017-06-27 MED ORDER — ALBUTEROL SULFATE (2.5 MG/3ML) 0.083% IN NEBU: 3.0000 mL | INHALATION_SOLUTION | RESPIRATORY_TRACT | Status: DC | PRN

## 2017-06-27 MED ORDER — SODIUM CHLORIDE 0.9% FLUSH: 3.0000 mL | INTRAVENOUS | Status: DC | PRN

## 2017-06-27 MED ORDER — FUROSEMIDE 10 MG/ML IJ SOLN
40.0000 mg | Freq: Two times a day (BID) | INTRAMUSCULAR | Status: DC
  Administered 2017-06-28 – 2017-06-30 (×5): 40 mg via INTRAVENOUS
  Filled 2017-06-27 (×5): qty 4

## 2017-06-27 MED ORDER — METHYLPREDNISOLONE SODIUM SUCC 40 MG IJ SOLR
40.0000 mg | Freq: Four times a day (QID) | INTRAMUSCULAR | Status: AC
  Administered 2017-06-27 – 2017-06-28 (×4): 40 mg via INTRAVENOUS
  Filled 2017-06-27 (×4): qty 1

## 2017-06-27 MED ORDER — FUROSEMIDE 10 MG/ML IJ SOLN
40.0000 mg | Freq: Once | INTRAMUSCULAR | Status: AC
  Administered 2017-06-27: 40 mg via INTRAVENOUS
  Filled 2017-06-27: qty 4

## 2017-06-27 MED ORDER — SODIUM CHLORIDE 0.9% FLUSH
3.0000 mL | Freq: Two times a day (BID) | INTRAVENOUS | Status: DC
  Administered 2017-06-27 – 2017-06-30 (×6): 3 mL via INTRAVENOUS

## 2017-06-27 MED ORDER — ACETAMINOPHEN 325 MG PO TABS: 650.0000 mg | ORAL_TABLET | ORAL | Status: DC | PRN

## 2017-06-27 MED ORDER — PREDNISONE 20 MG PO TABS
40.0000 mg | ORAL_TABLET | Freq: Every day | ORAL | Status: DC
  Administered 2017-06-28 – 2017-06-29 (×2): 40 mg via ORAL
  Filled 2017-06-27: qty 2
  Filled 2017-06-27: qty 4

## 2017-06-27 MED ORDER — ALBUTEROL SULFATE (2.5 MG/3ML) 0.083% IN NEBU
3.0000 mL | INHALATION_SOLUTION | Freq: Four times a day (QID) | RESPIRATORY_TRACT | Status: DC
  Administered 2017-06-27 – 2017-06-28 (×3): 3 mL via RESPIRATORY_TRACT
  Filled 2017-06-27 (×3): qty 3

## 2017-06-27 MED ORDER — SODIUM CHLORIDE 0.9 % IV SOLN
INTRAVENOUS | Status: DC
  Administered 2017-06-27: 13:00:00 via INTRAVENOUS

## 2017-06-27 MED ORDER — NICOTINE 14 MG/24HR TD PT24
14.0000 mg | MEDICATED_PATCH | Freq: Every day | TRANSDERMAL | Status: DC
  Administered 2017-06-27 – 2017-06-30 (×4): 14 mg via TRANSDERMAL
  Filled 2017-06-27 (×5): qty 1

## 2017-06-27 MED ORDER — ENOXAPARIN SODIUM 40 MG/0.4ML ~~LOC~~ SOLN
40.0000 mg | SUBCUTANEOUS | Status: DC
  Administered 2017-06-27 – 2017-06-29 (×3): 40 mg via SUBCUTANEOUS
  Filled 2017-06-27 (×3): qty 0.4

## 2017-06-27 MED ORDER — AMLODIPINE BESYLATE 5 MG PO TABS
10.0000 mg | ORAL_TABLET | Freq: Every day | ORAL | Status: DC
  Administered 2017-06-28 – 2017-06-30 (×3): 10 mg via ORAL
  Filled 2017-06-27 (×3): qty 2

## 2017-06-27 MED ORDER — POTASSIUM CHLORIDE CRYS ER 20 MEQ PO TBCR
20.0000 meq | EXTENDED_RELEASE_TABLET | Freq: Two times a day (BID) | ORAL | Status: DC
  Administered 2017-06-27 – 2017-06-28 (×3): 20 meq via ORAL
  Filled 2017-06-27 (×4): qty 1

## 2017-06-27 NOTE — ED Notes (Signed)
Respiratory at bedside.

## 2017-06-27 NOTE — ED Notes (Signed)
EDP at bedside.

## 2017-06-27 NOTE — ED Notes (Signed)
Sats 98% on 4L. Decrease oxygen to 2L. Sats 95%.

## 2017-06-27 NOTE — ED Provider Notes (Signed)
[facility] EMERGENCY DEPARTMENT Provider Note   CSN: [id number] Arrival date & time: 06/27/17  1155     History   Chief Complaint No chief complaint on file.   HPI Marisa Bailey is a 77 y.o. female.  HPI   She presents for evaluation of leg and abdominal swelling for several days.  She denies shortness of breath, cough, wheezing, headache, chest pain, weakness or dizziness.  She is a smoker.  She denies other problems with heart or lungs.  She came here by private vehicle for evaluation.  There are no other known modifying factors.  Past Medical History:  Diagnosis Date  . Bronchitis   . Hypertension     Patient Active Problem List   Diagnosis Date Noted  . Muscle weakness (generalized) 08/17/2012  . Pain in joint, shoulder region 08/17/2012  . Fx upper humerus-closed 08/17/2012    Past Surgical History:  Procedure Laterality Date  . ENUCLEATION       OB History   None      Home Medications    Prior to Admission medications   Medication Sig Start Date End Date Taking? Authorizing Provider  amLODipine (NORVASC) 10 MG tablet Take 10 mg by mouth daily.   Yes [provider]  aspirin EC 81 MG tablet Take 81 mg by mouth at bedtime.    Yes [provider]  folic acid (FOLVITE) 1 MG tablet Take 1 mg by mouth daily.  06/11/17  Yes [provider]  olmesartan (BENICAR) 40 MG tablet Take 40 mg by mouth daily.   Yes [provider]  PROAIR HFA 108 (90 Base) MCG/ACT inhaler Inhale 1-2 puffs into the lungs every 4 (four) hours as needed for wheezing.  05/28/17  Yes [provider]    Family History No family history on file.  Social History Social History   Tobacco Use  . Smoking status: Current Every Day Smoker    Packs/day: 0.50    Types: Cigarettes  . Smokeless tobacco: Never Used  Substance Use Topics  . Alcohol use: No    Comment: rarely  . Drug use: No     Allergies   Penicillins   Review of  Systems Review of Systems  All other systems reviewed and are negative.    Physical Exam Updated Vital Signs BP 137/61   Pulse 78   Temp 98.5 F (36.9 C) (Oral)   Resp 19   Ht _0  (1.626 m)   Wt 81.6 kg (180 lb)   SpO2 91%   BMI 30.90 kg/m   Physical Exam  Constitutional: She is oriented to person, place, and time. She appears well-developed and well-nourished. No distress.  HENT:  Head: Normocephalic and atraumatic.  Eyes: Conjunctivae are normal.  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  Patient states she cannot see anything from her right eye.  Left eye appears normal with normal pupillary response and no conjunctival injection.  Neck: Normal range of motion and phonation normal. Neck supple.  Cardiovascular: Normal rate and regular rhythm.  Pulmonary/Chest: Effort normal. She exhibits no tenderness.  Mildly decreased air well bilaterally left greater than right with bilateral rhonchi, and scattered wheezes.  There is no increased work of breathing.  Abdominal: Soft. She exhibits distension. There is no tenderness. There is no guarding.  There is abdominal wall swelling consistent with fluid overload.  Musculoskeletal: Normal range of motion. She exhibits edema (2+ bilateral leg edema.  Edema extends to groin  and abdomen.).  Neurological: She is alert and oriented to person, place, and time. She exhibits normal muscle tone.  Skin: Skin is warm and dry. No erythema. No pallor.  Psychiatric: She has a normal mood and affect. Her behavior is normal. Judgment and thought content normal.  Nursing note and vitals reviewed.    ED Treatments / Results  Labs (all labs ordered are listed, but only abnormal results are displayed) Labs Reviewed  COMPREHENSIVE METABOLIC PANEL - Abnormal; Notable for the following components:      Result Value   Chloride 97 (*)    CO2 33 (*)    Calcium 8.4 (*)    Albumin 3.4 (*)    ALT 13 (*)    All other components  within normal limits  TROPONIN I - Abnormal; Notable for the following components:   Troponin I 0.06 (*)    All other components within normal limits  BRAIN NATRIURETIC PEPTIDE - Abnormal; Notable for the following components:   B Natriuretic Peptide 359.0 (*)    All other components within normal limits  CBC WITH DIFFERENTIAL/PLATELET - Abnormal; Notable for the following components:   RBC 5.15 (*)    MCH 24.5 (*)    MCHC 28.9 (*)    RDW 18.7 (*)    All other components within normal limits  TROPONIN I - Abnormal; Notable for the following components:   Troponin I 0.07 (*)    All other components within normal limits  URINALYSIS, ROUTINE W REFLEX MICROSCOPIC  I-STAT TROPONIN, ED    EKG None  Radiology Dg Chest Port 1 View  Result Date: 06/27/2017 CLINICAL DATA:  77 year old female with lower extremity swelling for a few days. Hypoxia. EXAM: PORTABLE CHEST 1 VIEW COMPARISON:  Chest radiographs 06/12/2016 and earlier. FINDINGS: Portable AP upright view at 1241 hours. Stable cardiomegaly and mediastinal contours. Basilar predominant increased pulmonary interstitium. Left lung base hypo ventilation. No pneumothorax or large pleural effusion. Visualized tracheal air column is within normal limits. Paucity bowel gas in the upper abdomen. IMPRESSION: 1. Chronic cardiomegaly with probable pulmonary interstitial edema. 2. Retrocardiac hypo ventilation, favor atelectasis. No large pleural effusion. Electronically Signed   By: Genevie Ann M.D.   On: 06/27/2017 12:54    Procedures Procedures (including critical care time)  Medications Ordered in ED Medications  0.9 %  sodium chloride infusion ( Intravenous New Bag/Given 06/27/17 1325)  albuterol (PROVENTIL,VENTOLIN) solution continuous neb (10 mg/hr Nebulization Given 06/27/17 1324)  methylPREDNISolone sodium succinate (SOLU-MEDROL) 125 mg/2 mL injection 125 mg (125 mg Intravenous Given 06/27/17 1325)  furosemide (LASIX) injection 40 mg (40 mg  Intravenous Given 06/27/17 1325)     Initial Impression / Assessment and Plan / ED Course  I have reviewed the triage vital signs and the nursing notes.  Pertinent labs & imaging results that were available during my care of the patient were reviewed by me and considered in my medical decision making (see chart for details).  Clinical Course as of Jun 28 1555  Fri Jun 27, 2017  1338 Normal except RBC indices are slightly low.  CBC with Differential(!) [EW]  1338 Mild elevation  Troponin I(!!) [EW]  1339 High  Brain natriuretic peptide(!) [EW]  1339 Normal  I-stat troponin, ED [EW]  1339 Normal except chloride low at 97, CO2 33, calcium low 8.4, albumin low 3.4, ALT low at 13.  Comprehensive metabolic panel(!) [EW]  9379 Consistent with mild edema, and cardiomegaly.  DG Chest Port 1 View [EW]  [phone]  Nasal cannula oxygen removed and she quickly desaturated to 78%.  At this time she did not notice shortness of breath.  Oxygen replaced at 2 L and she regained a oxygen saturation of 90%, within 4 minutes.   [EW]  9417 EYCXK troponin elevated but unchanged.  Troponin I(!!) [EW]    Clinical Course User Index [EW] Daleen Bo, MD     Patient Vitals for the past 24 hrs:  BP Temp Temp src Pulse Resp SpO2 Height Weight  06/27/17 1530 137/61 - - 78 19 91 % - -  06/27/17 1500 (!) 147/59 - - 75 (!) 21 99 % - -  06/27/17 1430 (!) 141/55 - - 71 19 100 % - -  06/27/17 1422 (!) 143/62 - - 73 (!) 26 100 % - -  06/27/17 1400 (!) 143/62 - - 65 - 99 % - -  06/27/17 1330 138/60 - - 66 - 100 % - -  06/27/17 1300 133/63 - - 69 - 94 % - -  06/27/17 1230 138/62 - - 66 (!) 27 97 % - -  06/27/17 1222 - - - - - (!) 78 % - -  06/27/17 1214 - - - - - - _0  (1.626 m) 81.6 kg (180 lb)  06/27/17 1212 - - - - - (!) 81 % - -  06/27/17 1209 (!) 142/68 98.5 F (36.9 C) Oral 73 19 (!) 77 % - -    3:57 PM Reevaluation with update and discussion. After initial assessment and treatment, an updated  evaluation reveals patient has voided once.  She remains comfortable without chest pain or subjective shortness of breath.  Findings discussed with the patient, she is agreeable to hospitalization. Daleen Bo   Medical Decision Making: Peripheral edema, likely secondary to congestive heart failure, which has also apparently caused hypoxia.  Additionally I suspect that she has a component of COPD secondary to her tobacco abuse.  She will require admission for stabilization.  CRITICAL CARE-yes Performed by: Daleen Bo  Nursing Notes Reviewed/ Care Coordinated Applicable Imaging Reviewed Interpretation of Laboratory Data incorporated into ED treatment  3:58 PM-Consult complete with hospitalist. Patient case explained and discussed.  He agrees to admit patient for further evaluation and treatment. Call ended at [place]: Admit  Final Clinical Impressions(s) / ED Diagnoses   Final diagnoses:  Congestive heart failure, unspecified HF chronicity, unspecified heart failure type ([place])  Hypoxia  Tobacco abuse  Peripheral edema    ED Discharge Orders    None       Daleen Bo, MD 06/27/17 1615

## 2017-06-27 NOTE — ED Notes (Signed)
CRITICAL VALUE ALERT  Critical Value:  Troponin 0.06  Date & Time Notied:  06-27-17 1331  Provider Notified: Eulis Foster  Orders Received/Actions taken: continue to monitor

## 2017-06-27 NOTE — Progress Notes (Signed)
Pt placed HFNC due to high O2 demand

## 2017-06-27 NOTE — H&P (Signed)
History and Physical  Marisa Bailey YVO:[id number] DOB: 04/08/1940 DOA: 06/27/2017  Referring physician: Dr Eulis Foster, ED physician PCP: Lucia Gaskins, MD  Outpatient Specialists: none  Patient Coming From: home  Chief Complaint: leg swelling, hypoxia  HPI: Marisa Bailey is a 77 y.o. female with a history of CHTN. Patient complains of worsening leg swelling over the past 3-4 days. Denies SOB, although patient was significantly hypoxic to the 70s. She complains of wheezing and nonproductive cough. Breathing worsened by laying down and exertion. Improved with rest. No other palliating or provoking factors. She is a smoker.  Emergency Department Course: Troponin initially 0.06. CXR shows pulmonary edema.  Review of Systems:   Pt denies any fevers, chills, nausea, vomiting, diarrhea, constipation, abdominal pain, palpitations, headache, vision changes, lightheadedness, dizziness, melena, rectal bleeding.  Review of systems are otherwise negative  Past Medical History:  Diagnosis Date  . Bronchitis   . Hypertension    Past Surgical History:  Procedure Laterality Date  . ENUCLEATION     Social History:  reports that she has been smoking cigarettes.  She has been smoking about 0.50 packs per day. She has never used smokeless tobacco. She reports that she does not drink alcohol or use drugs. Patient lives at home  Allergies  Allergen Reactions  . Penicillins Rash    Has patient had a PCN reaction causing immediate rash, facial/tongue/throat swelling, SOB or lightheadedness with hypotension: {Yes/No:[id number] Has patient had a PCN reaction causing severe rash involving mucus membranes or skin necrosis: Yes Has patient had a PCN reaction that required hospitalization No Has patient had a PCN reaction occurring within the last 10 years: No If all of the above answers are "NO", then may proceed with Cephalosporin use.    Family history of htn.   Prior to Admission medications     Medication Sig Start Date End Date Taking? Authorizing Provider  amLODipine (NORVASC) 10 MG tablet Take 10 mg by mouth daily.   Yes [provider]  aspirin EC 81 MG tablet Take 81 mg by mouth at bedtime.    Yes [provider]  folic acid (FOLVITE) 1 MG tablet Take 1 mg by mouth daily.  06/11/17  Yes [provider]  olmesartan (BENICAR) 40 MG tablet Take 40 mg by mouth daily.   Yes [provider]  PROAIR HFA 108 (90 Base) MCG/ACT inhaler Inhale 1-2 puffs into the lungs every 4 (four) hours as needed for wheezing.  05/28/17  Yes [provider]    Physical Exam: BP 132/62   Pulse 79   Temp 98.5 F (36.9 C) (Oral)   Resp 19   Ht _0  (1.626 m)   Wt 81.6 kg (180 lb)   SpO2 94%   BMI 30.90 kg/m   . General: Elderly black female. Awake and alert and oriented x3. No acute cardiopulmonary distress.  Marland Kitchen HEENT: Normocephalic atraumatic.  Right and left ears normal in appearance.  Pupils equal, round, reactive to light. Extraocular muscles are intact. Sclerae anicteric and noninjected.  Moist mucosal membranes. No mucosal lesions.  . Neck: Neck supple without lymphadenopathy. No carotid bruits. No masses palpated.  . Cardiovascular: Regular rate with normal S1-S2 sounds. No murmurs, rubs, gallops auscultated. Unable to appreciate JVD, although body habitus impedes exam. 2+ pitting edema. Marland Kitchen Respiratory: scattered wheezes throughout. No accessory muscle use. . Abdomen: Soft, nontender, nondistended. Active bowel sounds. No masses or hepatosplenomegaly  . Skin: No rashes, lesions, or ulcerations.  Dry, warm  to touch. 2+ dorsalis pedis and radial pulses. . Musculoskeletal: No calf or leg pain. All major joints not erythematous nontender.  No upper or lower joint deformation.  Good ROM.  No contractures  . Psychiatric: Intact judgment and insight. Pleasant and cooperative. . Neurologic: No focal neurological deficits. Strength is 5/5 and symmetric in upper  and lower extremities.  Cranial nerves II through XII are grossly intact.           Labs on Admission: I have personally reviewed following labs and imaging studies  CBC: Recent Labs  Lab 06/27/17 1235  WBC 4.7  NEUTROABS 3.2  HGB 12.6  HCT 43.6  MCV 84.7  PLT 616   Basic Metabolic Panel: Recent Labs  Lab 06/27/17 1235  NA 138  K 3.6  CL 97*  CO2 33*  GLUCOSE 98  BUN 16  CREATININE 0.85  CALCIUM 8.4*   GFR: Estimated Creatinine Clearance: 58.2 mL/min (by C-G formula based on SCr of 0.85 mg/dL). Liver Function Tests: Recent Labs  Lab 06/27/17 1235  AST 18  ALT 13*  ALKPHOS 61  BILITOT 1.0  PROT 7.5  ALBUMIN 3.4*   No results for input(s): LIPASE, AMYLASE in the last 168 hours. No results for input(s): AMMONIA in the last 168 hours. Coagulation Profile: No results for input(s): INR, PROTIME in the last 168 hours. Cardiac Enzymes: Recent Labs  Lab 06/27/17 1235 06/27/17 1452  TROPONINI 0.06* 0.07*   BNP (last 3 results) No results for input(s): PROBNP in the last 8760 hours. HbA1C: No results for input(s): HGBA1C in the last 72 hours. CBG: No results for input(s): GLUCAP in the last 168 hours. Lipid Profile: No results for input(s): CHOL, HDL, LDLCALC, TRIG, CHOLHDL, LDLDIRECT in the last 72 hours. Thyroid Function Tests: No results for input(s): TSH, T4TOTAL, FREET4, T3FREE, THYROIDAB in the last 72 hours. Anemia Panel: No results for input(s): VITAMINB12, FOLATE, FERRITIN, TIBC, IRON, RETICCTPCT in the last 72 hours. Urine analysis:    Component Value Date/Time   COLORURINE YELLOW 06/27/2017 [place] 06/27/2017 1235   LABSPEC 1.011 06/27/2017 1235   PHURINE 5.0 06/27/2017 1235   GLUCOSEU NEGATIVE 06/27/2017 [place] 06/27/2017 [place] 06/27/2017 [place] 06/27/2017 1235   PROTEINUR NEGATIVE 06/27/2017 1235   NITRITE NEGATIVE 06/27/2017 1235   LEUKOCYTESUR NEGATIVE  06/27/2017 1235   Sepsis Labs: _0 (procalcitonin:4,lacticidven:4) )No results found for this or any previous visit (from the past 240 hour(s)).   Radiological Exams on Admission: Dg Chest Port 1 View  Result Date: 06/27/2017 CLINICAL DATA:  77 year old female with lower extremity swelling for a few days. Hypoxia. EXAM: PORTABLE CHEST 1 VIEW COMPARISON:  Chest radiographs 06/12/2016 and earlier. FINDINGS: Portable AP upright view at 1241 hours. Stable cardiomegaly and mediastinal contours. Basilar predominant increased pulmonary interstitium. Left lung base hypo ventilation. No pneumothorax or large pleural effusion. Visualized tracheal air column is within normal limits. Paucity bowel gas in the upper abdomen. IMPRESSION: 1. Chronic cardiomegaly with probable pulmonary interstitial edema. 2. Retrocardiac hypo ventilation, favor atelectasis. No large pleural effusion. Electronically Signed   By: Genevie Ann M.D.   On: 06/27/2017 12:54    EKG: Independently reviewed. pending  Assessment/Plan: Principal Problem:   Acute respiratory failure with hypoxia (HCC) Active Problems:   COPD with acute exacerbation (HCC)   Acute exacerbation of CHF (congestive heart failure) ([place])   Hypertension   Tobacco abuse    This patient was discussed  with the ED physician, including pertinent vitals, physical exam findings, labs, and imaging.  We also discussed care given by the ED provider.  1. Acute respiratory failure with hypoxia a. Respiratory support 2. Acute CHF Telemetry monitoring Strict I/O Daily Weights Diuresis: lasix Potassium: 20 mEq twice a day by mouth Echo cardiac exam tomorrow Repeat BMP tomorrow Troponin elevated - will continue to trend. 3. COPD with exacerbation Antibiotics: none warranted at this point albuterol every 6 scheduled with albuterol every 2 when necessary Continue inhaled steroids and LA bronchodilator Solu-Medrol 60 mg IV every 12  hours Mucinex dulera 4. Tobacco abuse a. Nicoderm patch 5. HTN a. Amlodipine b. On ARB  DVT prophylaxis: lovenox Consultants: none Code Status: Full code Family Communication: none  Disposition Plan: patient should be able to rerturn home following admission   Jacob Moores [facility] Pager [phone]  If 7PM-7AM, please contact night-coverage [email or web address] Password [email or web address]

## 2017-06-27 NOTE — ED Notes (Signed)
Pt placed on 4L nasal cannula. Sats 78% on RA.

## 2017-06-27 NOTE — Progress Notes (Signed)
Patient arrived from the ER. Alert and oriented times 4. Purwick in place and tolerating well. Linen changed.  Infusing IV Fluids, validated cardiac monitoring. Currently eating in bed with no distress noted.

## 2017-06-27 NOTE — ED Triage Notes (Signed)
Bilateral swelling to feet for a few days, pt denies sob. resp even and non labored

## 2017-06-28 ENCOUNTER — Inpatient Hospital Stay (HOSPITAL_COMMUNITY): Payer: Medicare Other

## 2017-06-28 DIAGNOSIS — I361 Nonrheumatic tricuspid (valve) insufficiency: Secondary | ICD-10-CM

## 2017-06-28 LAB — BASIC METABOLIC PANEL
Anion gap: 9 (ref 5–15)
BUN: 21 mg/dL — ABNORMAL HIGH (ref 6–20)
CO2: 33 mmol/L — ABNORMAL HIGH (ref 22–32)
Calcium: 8.5 mg/dL — ABNORMAL LOW (ref 8.9–10.3)
Chloride: 99 mmol/L — ABNORMAL LOW (ref 101–111)
Creatinine, Ser: 0.87 mg/dL (ref 0.44–1.00)
GFR calc Af Amer: >60 mL/min (ref >60)
GFR calc non Af Amer: >60 mL/min (ref >60)
Glucose, Bld: 155 mg/dL — ABNORMAL HIGH (ref 65–99)
Potassium: 4.3 mmol/L (ref 3.5–5.1)
Sodium: 141 mmol/L (ref 135–145)

## 2017-06-28 LAB — TROPONIN I: Troponin I: 0.07 ng/mL (ref <0.03)

## 2017-06-28 LAB — ECHOCARDIOGRAM COMPLETE
Height: 64 in
Weight: 3139.35 oz

## 2017-06-28 MED ORDER — ALBUTEROL SULFATE (2.5 MG/3ML) 0.083% IN NEBU
3.0000 mL | INHALATION_SOLUTION | Freq: Three times a day (TID) | RESPIRATORY_TRACT | Status: DC
  Administered 2017-06-28 – 2017-06-30 (×5): 3 mL via RESPIRATORY_TRACT
  Filled 2017-06-28 (×5): qty 3

## 2017-06-28 NOTE — Progress Notes (Addendum)
Patient admitted due to increasing abdominal distention as well as pedal edema she denies any increased dyspnea there is a questionable fluid wave and a distended abdomen will obtain complete abdominal sonography to look for any evidence of ascites will likewise place TED hose for external compression for pedal edema ODALIZ MCQUEARY MWU:[id number] DOB: 11-09-1940 DOA: 06/27/2017 PCP: Lucia Gaskins, MD   Physical Exam: Blood pressure 136/68, pulse 69, temperature 98.4 F (36.9 C), temperature source Oral, resp. rate 19, height _0  (1.626 m), weight 89 kg (196 lb 3.4 oz), SpO2 96 %.  Lungs prolonged expiratory phase scattered rhonchi mild end expiratory wheeze no rales audible heart regular rhythm no S3-S4 no heaves thrills rubs abdomen obese soft nontender questionable positive fluid wave no guarding no rebound no masses no megaly   Investigations:  No results found for this or any previous visit (from the past 240 hour(s)).   Basic Metabolic Panel: Recent Labs    06/27/17 1235 06/28/17 0238  NA 138 141  K 3.6 4.3  CL 97* 99*  CO2 33* 33*  GLUCOSE 98 155*  BUN 16 21*  CREATININE 0.85 0.87  CALCIUM 8.4* 8.5*   Liver Function Tests: Recent Labs    06/27/17 1235  AST 18  ALT 13*  ALKPHOS 61  BILITOT 1.0  PROT 7.5  ALBUMIN 3.4*     CBC: Recent Labs    06/27/17 1235  WBC 4.7  NEUTROABS 3.2  HGB 12.6  HCT 43.6  MCV 84.7  PLT 249    Dg Chest Port 1 View  Result Date: 06/27/2017 CLINICAL DATA:  77 year old female with lower extremity swelling for a few days. Hypoxia. EXAM: PORTABLE CHEST 1 VIEW COMPARISON:  Chest radiographs 06/12/2016 and earlier. FINDINGS: Portable AP upright view at 1241 hours. Stable cardiomegaly and mediastinal contours. Basilar predominant increased pulmonary interstitium. Left lung base hypo ventilation. No pneumothorax or large pleural effusion. Visualized tracheal air column is within normal limits. Paucity bowel gas in the upper abdomen.  IMPRESSION: 1. Chronic cardiomegaly with probable pulmonary interstitial edema. 2. Retrocardiac hypo ventilation, favor atelectasis. No large pleural effusion. Electronically Signed   By: Genevie Ann M.D.   On: 06/27/2017 12:54      Medications:  Impression:  Principal Problem:   Acute respiratory failure with hypoxia (HCC) Active Problems:   COPD with acute exacerbation (HCC)   Acute exacerbation of CHF (congestive heart failure) (HCC)   Hypertension   Tobacco abuse     Plan: Serum ammonia level.  TED hose to lower extremities 3 abdominal sonogram to rule out ascites continue Solu-Medrol and nebulizer therapy as well as nicotine patch.  Continue Lasix 40 IV twice daily as well as KCl 20 mEq p.o. twice daily.  Monitor be met daily for 3 days  Consultants:    Procedures   Antibiotics:           Time spent: 30 minutes  LOS: 1 day   Selyna Klahn M   06/28/2017, 6:04 PM

## 2017-06-28 NOTE — Progress Notes (Signed)
  Echocardiogram 2D Echocardiogram has been performed.  Erla Bacchi L Androw 06/28/2017, 8:38 AM

## 2017-06-28 NOTE — Progress Notes (Signed)
Nutrition Brief Note  Nutrition Screen performed remotely as part of COPD gold protocol.   Wt Readings from Last 15 Encounters:  06/28/17 196 lb 3.4 oz (89 kg)  06/12/16 178 lb (80.7 kg)  01/17/14 176 lb (79.8 kg)  10/26/13 176 lb (79.8 kg)  07/07/12 168 lb (76.2 kg)   Body mass index is 33.68 kg/m. Patient meets criteria for obese based on current BMI.   Per MST, patient has not had unintentional weight loss or decreased intake r/t poor appetite.   Per H&P/ ed provider notes, the patient has not had any n/v/c/d or GI symptoms that may have impacted po intake. There is no mention of decreased appetite or weight changes. In fact, chart shows the patient has been gaining weight over past several years. Pt's Main complaint was BLE edema and SOB  Current diet order is Heart Healthy, patient is consuming approximately 90% of meals at this time.   No nutrition interventions warranted at this time. If nutrition issues arise, please consult RD.   Burtis Junes RD, LDN, CNSC Clinical Nutrition Available Tues-Sat via Pager: [phone] 06/28/2017 11:43 AM

## 2017-06-29 ENCOUNTER — Inpatient Hospital Stay (HOSPITAL_COMMUNITY): Payer: Medicare Other

## 2017-06-29 LAB — BASIC METABOLIC PANEL
Anion gap: 8 (ref 5–15)
BUN: 28 mg/dL — ABNORMAL HIGH (ref 6–20)
CO2: 34 mmol/L — ABNORMAL HIGH (ref 22–32)
Calcium: 8.2 mg/dL — ABNORMAL LOW (ref 8.9–10.3)
Chloride: 97 mmol/L — ABNORMAL LOW (ref 101–111)
Creatinine, Ser: 0.93 mg/dL (ref 0.44–1.00)
GFR calc Af Amer: >60 mL/min (ref >60)
GFR calc non Af Amer: 58 mL/min — ABNORMAL LOW (ref >60)
Glucose, Bld: 140 mg/dL — ABNORMAL HIGH (ref 65–99)
Potassium: 5.3 mmol/L — ABNORMAL HIGH (ref 3.5–5.1)
Sodium: 139 mmol/L (ref 135–145)

## 2017-06-29 LAB — CBC WITH DIFFERENTIAL/PLATELET
Basophils Absolute: 0 10*3/uL (ref 0.0–0.1)
Basophils Relative: 0 %
Eosinophils Absolute: 0 10*3/uL (ref 0.0–0.7)
Eosinophils Relative: 0 %
HCT: 41.5 % (ref 36.0–46.0)
Hemoglobin: 11.6 g/dL — ABNORMAL LOW (ref 12.0–15.0)
Lymphocytes Relative: 6 %
Lymphs Abs: 0.2 10*3/uL — ABNORMAL LOW (ref 0.7–4.0)
MCH: 24 pg — ABNORMAL LOW (ref 26.0–34.0)
MCHC: 28 g/dL — ABNORMAL LOW (ref 30.0–36.0)
MCV: 85.7 fL (ref 78.0–100.0)
Monocytes Absolute: 0.2 10*3/uL (ref 0.1–1.0)
Monocytes Relative: 6 %
Neutro Abs: 3.4 10*3/uL (ref 1.7–7.7)
Neutrophils Relative %: 88 %
Platelets: 243 10*3/uL (ref 150–400)
RBC: 4.84 MIL/uL (ref 3.87–5.11)
RDW: 19 % — ABNORMAL HIGH (ref 11.5–15.5)
WBC: 3.9 10*3/uL — ABNORMAL LOW (ref 4.0–10.5)

## 2017-06-29 MED ORDER — POTASSIUM CHLORIDE CRYS ER 20 MEQ PO TBCR: 20.0000 meq | EXTENDED_RELEASE_TABLET | Freq: Every day | ORAL | Status: DC

## 2017-06-29 MED ORDER — POTASSIUM CHLORIDE CRYS ER 20 MEQ PO TBCR: 20.0000 meq | EXTENDED_RELEASE_TABLET | Freq: Two times a day (BID) | ORAL | Status: DC

## 2017-06-29 NOTE — Progress Notes (Signed)
Compression stockings placed yesterday less pedal edema abdominal sonogram reveals no evidence of dilatation of venous system or ascites.  Patient receiving Lasix 40 IV twice daily Marisa Bailey NID:[id number] DOB: 1941-01-13 DOA: 06/27/2017 PCP: Marisa Gaskins, MD   Physical Exam: Blood pressure (!) 127/56, pulse (!) 59, temperature 98.2 F (36.8 C), temperature source Oral, resp. rate 20, height _0  (1.626 m), weight 88.6 kg (195 lb 5.2 oz), SpO2 98 %.  Lungs diminished breath sounds at the bases prolonged expiratory phase no rales no wheeze no rhonchi appreciable heart regular rhythm no S3-S4 and he feels rubs   Investigations:  No results found for this or any previous visit (from the past 240 hour(s)).   Basic Metabolic Panel: Recent Labs    06/28/17 0238 06/29/17 0644  NA 141 139  K 4.3 5.3*  CL 99* 97*  CO2 33* 34*  GLUCOSE 155* 140*  BUN 21* 28*  CREATININE 0.87 0.93  CALCIUM 8.5* 8.2*   Liver Function Tests: Recent Labs    06/27/17 1235  AST 18  ALT 13*  ALKPHOS 61  BILITOT 1.0  PROT 7.5  ALBUMIN 3.4*     CBC: Recent Labs    06/27/17 1235 06/29/17 0644  WBC 4.7 3.9*  NEUTROABS 3.2 3.4  HGB 12.6 11.6*  HCT 43.6 41.5  MCV 84.7 85.7  PLT 249 243    US Abdomen Complete  Result Date: 06/29/2017 CLINICAL DATA:  Patient with nausea.  Elevated LFTs. EXAM: ABDOMEN ULTRASOUND COMPLETE COMPARISON:  None. FINDINGS: Gallbladder: There is marked wall thickening of the gallbladder measuring up to 11 mm. No definite stones. Small amount of pericholecystic fluid. Negative sonographic Murphy's sign. Common bile duct: Diameter: 4 mm Liver: Mildly increased in echogenicity. No focal lesion identified. Portal vein is patent on color Doppler imaging with normal direction of blood flow towards the liver. IVC: No abnormality visualized. Pancreas: Not visualized. Spleen: Size and appearance within normal limits. Right Kidney: Length: 10.5 cm. Echogenicity within normal  limits. No mass or hydronephrosis visualized. Trace perinephric fluid. Left Kidney: Length: 9.7 cm. Echogenicity within normal limits. No mass or hydronephrosis visualized. Abdominal aorta: No aneurysm visualized. Other findings: None. IMPRESSION: 1. Nonspecific wall thickening of the gallbladder measuring up to 11 mm. Considerations include secondary thickening from hepatitis, hepatic failure or low albumin. The possibility of cholecystitis is not entirely excluded. Negative sonographic Murphy sign. No cholelithiasis. 2. Findings suggestive of hepatic steatosis. Electronically Signed   By: Lovey Newcomer M.D.   On: 06/29/2017 10:58   Dg Chest Port 1 View  Result Date: 06/27/2017 CLINICAL DATA:  77 year old female with lower extremity swelling for a few days. Hypoxia. EXAM: PORTABLE CHEST 1 VIEW COMPARISON:  Chest radiographs 06/12/2016 and earlier. FINDINGS: Portable AP upright view at 1241 hours. Stable cardiomegaly and mediastinal contours. Basilar predominant increased pulmonary interstitium. Left lung base hypo ventilation. No pneumothorax or large pleural effusion. Visualized tracheal air column is within normal limits. Paucity bowel gas in the upper abdomen. IMPRESSION: 1. Chronic cardiomegaly with probable pulmonary interstitial edema. 2. Retrocardiac hypo ventilation, favor atelectasis. No large pleural effusion. Electronically Signed   By: Genevie Ann M.D.   On: 06/27/2017 12:54      Medications:  Impression:  Principal Problem:   Acute respiratory failure with hypoxia (HCC) Active Problems:   COPD with acute exacerbation (HCC)   Acute exacerbation of CHF (congestive heart failure) (HCC)   Hypertension   Tobacco abuse     Plan: Continue Solu-Medrol 40 IV  every 6 hours continue DuoNeb nebulizers continue Lasix 40 IV twice daily monitor to be met in a.m.  Consultants:   Procedures   Antibiotics:   Time spent: 30 minutes   LOS: 2 days   [name] M   06/29/2017, 11:43  AM

## 2017-06-29 NOTE — Progress Notes (Signed)
K+ held due to K+ level of 5.3

## 2017-06-30 LAB — BASIC METABOLIC PANEL
Anion gap: 8 (ref 5–15)
BUN: 32 mg/dL — ABNORMAL HIGH (ref 6–20)
CO2: 38 mmol/L — ABNORMAL HIGH (ref 22–32)
Calcium: 8.7 mg/dL — ABNORMAL LOW (ref 8.9–10.3)
Chloride: 94 mmol/L — ABNORMAL LOW (ref 101–111)
Creatinine, Ser: 0.88 mg/dL (ref 0.44–1.00)
GFR calc Af Amer: >60 mL/min (ref >60)
GFR calc non Af Amer: >60 mL/min (ref >60)
Glucose, Bld: 124 mg/dL — ABNORMAL HIGH (ref 65–99)
Potassium: 5.1 mmol/L (ref 3.5–5.1)
Sodium: 140 mmol/L (ref 135–145)

## 2017-06-30 MED ORDER — POTASSIUM CHLORIDE CRYS ER 20 MEQ PO TBCR: 20.0000 meq | EXTENDED_RELEASE_TABLET | Freq: Every day | ORAL | 0 refills | Status: DC

## 2017-06-30 MED ORDER — ALBUTEROL SULFATE HFA 108 (90 BASE) MCG/ACT IN AERS: 2.0000 | INHALATION_SPRAY | Freq: Four times a day (QID) | RESPIRATORY_TRACT | Status: DC

## 2017-06-30 MED ORDER — ALBUTEROL SULFATE HFA 108 (90 BASE) MCG/ACT IN AERS
2.0000 | INHALATION_SPRAY | Freq: Four times a day (QID) | RESPIRATORY_TRACT | Status: DC
  Filled 2017-06-30: qty 6.7

## 2017-06-30 MED ORDER — PREDNISONE 20 MG PO TABS: 40.0000 mg | ORAL_TABLET | Freq: Every day | ORAL | 1 refills | Status: DC

## 2017-06-30 MED ORDER — FUROSEMIDE 20 MG PO TABS: 20.0000 mg | ORAL_TABLET | Freq: Every day | ORAL | Status: DC

## 2017-06-30 MED ORDER — ALBUTEROL SULFATE (2.5 MG/3ML) 0.083% IN NEBU: 3.0000 mL | INHALATION_SOLUTION | Freq: Four times a day (QID) | RESPIRATORY_TRACT | Status: DC

## 2017-06-30 MED ORDER — ALBUTEROL SULFATE HFA 108 (90 BASE) MCG/ACT IN AERS: 2.0000 | INHALATION_SPRAY | Freq: Four times a day (QID) | RESPIRATORY_TRACT | 0 refills | Status: DC

## 2017-06-30 MED ORDER — AZITHROMYCIN 250 MG PO TABS: ORAL_TABLET | ORAL | 0 refills | Status: DC

## 2017-06-30 MED ORDER — FUROSEMIDE 20 MG PO TABS: 20.0000 mg | ORAL_TABLET | Freq: Every day | ORAL | 0 refills | Status: DC

## 2017-06-30 MED ORDER — AZITHROMYCIN 250 MG PO TABS: 250.0000 mg | ORAL_TABLET | Freq: Every day | ORAL | Status: DC

## 2017-06-30 NOTE — Care Management Note (Signed)
Case Management Note  Patient Details  Name: KEIKO MYRICKS MRN: [id number] Date of Birth: Oct 10, 1940    Expected Discharge Date:  06/30/17               Expected Discharge Plan:  Home/Self Care  In-House Referral:     Discharge planning Services  CM Consult  Post Acute Care Choice:  NA Choice offered to:  NA  DME Arranged:    DME Agency:     HH Arranged:    [place] Agency:     Status of Service:  Completed, signed off  If discussed at [employer] of Stay Meetings, dates discussed:    Additional Comments: Patient discharging home today. No CM needs communicated. Family to transport patient home.   Cailah Reach, Chauncey [place], RN 06/30/2017, 1:34 PM

## 2017-06-30 NOTE — Discharge Summary (Signed)
Physician Discharge Summary  Marisa Bailey RWE:[id number] DOB: 1940-08-30 DOA: 06/27/2017  PCP: Lucia Gaskins, MD  Admit date: 06/27/2017 Discharge date: 06/30/2017   Recommendations for Outpatient Follow-up:  Patient is instructed to take Zithromax 250 mg 2 pills day 1 and 1 pill a day for 4 days thereafter and then discontinue.  She is likewise to take prednisone 40 mg/day for 7 consecutive days.  She is to take Lasix 20 mg p.o. daily she is to take K-Dur 20 milliequivalents p.o. daily and she is to take Proventil inhaler 2 puffs 4 times daily daily she is likewise instructed never to smoke cigarettes again she is voices verbal understanding of this she is to follow-up in my office within 1 week's time to reassess her progress Discharge Diagnoses:  Principal Problem:   Acute respiratory failure with hypoxia ([place]) Active Problems:   COPD with acute exacerbation ([place])   Acute exacerbation of CHF (congestive heart failure) ([place])   Hypertension   Tobacco abuse   Discharge Condition: Good  Filed Weights   06/28/17 0529 06/29/17 0500 06/30/17 0548  Weight: 89 kg (196 lb 3.4 oz) 88.6 kg (195 lb 5.2 oz) 86.3 kg (190 lb 4.8 oz)    History of present illness: Patient 77 year old black female with a history of hypertension COPD still smoking half pack per day comes in hospital complaining of bilateral pedal edema she had some hypoxia in the low 80s in the ER was subsequently admitted given intravenous steroids as well as nebulizer therapy her oxygenation improved over the next 2 to 4 days 2D echocardiogram revealed normal systolic function no significant valvular abnormalities she had a protuberant abdomen questionable fluid wave abdominal sonogram was performed revealing no evidence of ascites and she had TED stockings placed on the lower extremities for bilateral pedal edema.  Pedal edema improved her oxygenation improved she was subsequently discharged on several new medicines prednisone  40 mg/day for 7 days then discontinue.  Proventil inhaler 2 puffs 4 times daily Z-Pak Zithromax as directed and KCl 20 mEq p.o. daily and tablet form she is instructed to follow-up in my office in 1 week's time to assess her progress she is likewise instructed never to smoke cigarettes again  Hospital Course:    Procedures:    Consultations:    Discharge Instructions  Discharge Instructions    Discharge instructions   Complete by:  As directed    Discharge patient   Complete by:  As directed    Discharge disposition:  01-Home or Self Care   Discharge patient date:  06/30/2017     Allergies as of 06/30/2017      Reactions   Penicillins Rash   Has patient had a PCN reaction causing immediate rash, facial/tongue/throat swelling, SOB or lightheadedness with hypotension: {Yes/No:[id number] Has patient had a PCN reaction causing severe rash involving mucus membranes or skin necrosis: Yes Has patient had a PCN reaction that required hospitalization No Has patient had a PCN reaction occurring within the last 10 years: No If all of the above answers are "NO", then may proceed with Cephalosporin use.      Medication List    TAKE these medications   amLODipine 10 MG tablet Commonly known as:  NORVASC Take 10 mg by mouth daily.   aspirin EC 81 MG tablet Take 81 mg by mouth at bedtime.   azithromycin 250 MG tablet Commonly known as:  ZITHROMAX 2 tablets P.O. On day one then 1 tablet P.O. Daily for 4  days   folic acid 1 MG tablet Commonly known as:  FOLVITE Take 1 mg by mouth daily.   furosemide 20 MG tablet Commonly known as:  LASIX Take 1 tablet (20 mg total) by mouth daily.   olmesartan 40 MG tablet Commonly known as:  BENICAR Take 40 mg by mouth daily.   potassium chloride SA 20 MEQ tablet Commonly known as:  K-DUR,KLOR-CON Take 1 tablet (20 mEq total) by mouth daily. Start taking on:  07/01/2017   predniSONE 20 MG tablet Commonly known as:  DELTASONE Take 2  tablets (40 mg total) by mouth daily with supper.   PROAIR HFA 108 (90 Base) MCG/ACT inhaler Generic drug:  albuterol Inhale 1-2 puffs into the lungs every 4 (four) hours as needed for wheezing.      Allergies  Allergen Reactions  . Penicillins Rash    Has patient had a PCN reaction causing immediate rash, facial/tongue/throat swelling, SOB or lightheadedness with hypotension: {Yes/No:[id number] Has patient had a PCN reaction causing severe rash involving mucus membranes or skin necrosis: Yes Has patient had a PCN reaction that required hospitalization No Has patient had a PCN reaction occurring within the last 10 years: No If all of the above answers are "NO", then may proceed with Cephalosporin use.       The results of significant diagnostics from this hospitalization (including imaging, microbiology, ancillary and laboratory) are listed below for reference.    Significant Diagnostic Studies: US Abdomen Complete  Result Date: 06/29/2017 CLINICAL DATA:  Patient with nausea.  Elevated LFTs. EXAM: ABDOMEN ULTRASOUND COMPLETE COMPARISON:  None. FINDINGS: Gallbladder: There is marked wall thickening of the gallbladder measuring up to 11 mm. No definite stones. Small amount of pericholecystic fluid. Negative sonographic Murphy's sign. Common bile duct: Diameter: 4 mm Liver: Mildly increased in echogenicity. No focal lesion identified. Portal vein is patent on color Doppler imaging with normal direction of blood flow towards the liver. IVC: No abnormality visualized. Pancreas: Not visualized. Spleen: Size and appearance within normal limits. Right Kidney: Length: 10.5 cm. Echogenicity within normal limits. No mass or hydronephrosis visualized. Trace perinephric fluid. Left Kidney: Length: 9.7 cm. Echogenicity within normal limits. No mass or hydronephrosis visualized. Abdominal aorta: No aneurysm visualized. Other findings: None. IMPRESSION: 1. Nonspecific wall thickening of the gallbladder  measuring up to 11 mm. Considerations include secondary thickening from hepatitis, hepatic failure or low albumin. The possibility of cholecystitis is not entirely excluded. Negative sonographic Murphy sign. No cholelithiasis. 2. Findings suggestive of hepatic steatosis. Electronically Signed   By: Lovey Newcomer M.D.   On: 06/29/2017 10:58   Dg Chest Port 1 View  Result Date: 06/27/2017 CLINICAL DATA:  77 year old female with lower extremity swelling for a few days. Hypoxia. EXAM: PORTABLE CHEST 1 VIEW COMPARISON:  Chest radiographs 06/12/2016 and earlier. FINDINGS: Portable AP upright view at 1241 hours. Stable cardiomegaly and mediastinal contours. Basilar predominant increased pulmonary interstitium. Left lung base hypo ventilation. No pneumothorax or large pleural effusion. Visualized tracheal air column is within normal limits. Paucity bowel gas in the upper abdomen. IMPRESSION: 1. Chronic cardiomegaly with probable pulmonary interstitial edema. 2. Retrocardiac hypo ventilation, favor atelectasis. No large pleural effusion. Electronically Signed   By: Genevie Ann M.D.   On: 06/27/2017 12:54    Microbiology: No results found for this or any previous visit (from the past 240 hour(s)).   Labs: Basic Metabolic Panel: Recent Labs  Lab 06/27/17 1235 06/28/17 0238 06/29/17 0644 06/30/17 0622  NA 138 141 139 140  K 3.6 4.3 5.3* 5.1  CL 97* 99* 97* 94*  CO2 33* 33* 34* 38*  GLUCOSE 98 155* 140* 124*  BUN 16 21* 28* 32*  CREATININE 0.85 0.87 0.93 0.88  CALCIUM 8.4* 8.5* 8.2* 8.7*   Liver Function Tests: Recent Labs  Lab 06/27/17 1235  AST 18  ALT 13*  ALKPHOS 61  BILITOT 1.0  PROT 7.5  ALBUMIN 3.4*   No results for input(s): LIPASE, AMYLASE in the last 168 hours. No results for input(s): AMMONIA in the last 168 hours. CBC: Recent Labs  Lab 06/27/17 1235 06/29/17 0644  WBC 4.7 3.9*  NEUTROABS 3.2 3.4  HGB 12.6 11.6*  HCT 43.6 41.5  MCV 84.7 85.7  PLT 249 243   Cardiac  Enzymes: Recent Labs  Lab 06/27/17 1235 06/27/17 1452 06/27/17 1959 06/28/17 0237  TROPONINI 0.06* 0.07* 0.07* 0.07*   BNP: BNP (last 3 results) Recent Labs    06/27/17 1220  BNP 359.0*    ProBNP (last 3 results) No results for input(s): PROBNP in the last 8760 hours.  CBG: No results for input(s): GLUCAP in the last 168 hours.     Signed:  Maricela Curet   Pager: [phone] 06/30/2017, 12:49 PM

## 2017-06-30 NOTE — Progress Notes (Signed)
IV removed, WNL. D/C instructions given to pt and family. Verbalized understanding. Pt family at bedside to transport home.

## 2017-06-30 NOTE — Care Management Important Message (Signed)
Important Message  Patient Details  Name: ROSAISELA JAMROZ MRN: [id number] Date of Birth: 06-02-1940   Medicare Important Message Given:  Yes    Shelda Altes 06/30/2017, 11:32 AM

## 2018-01-24 ENCOUNTER — Emergency Department (HOSPITAL_COMMUNITY): Payer: Medicare Other

## 2018-01-24 ENCOUNTER — Inpatient Hospital Stay (HOSPITAL_COMMUNITY)
Admission: EM | Admit: 2018-01-24 | Discharge: 2018-01-28 | DRG: 189 | Disposition: A | Discharge status: Home / Self Care | Payer: Medicare Other | Attending: Family Medicine | Admitting: Family Medicine

## 2018-01-24 ENCOUNTER — Encounter (HOSPITAL_COMMUNITY): Payer: Self-pay

## 2018-01-24 ENCOUNTER — Other Ambulatory Visit: Payer: Self-pay

## 2018-01-24 DIAGNOSIS — J449 Chronic obstructive pulmonary disease, unspecified: Secondary | ICD-10-CM | POA: Diagnosis present

## 2018-01-24 DIAGNOSIS — Z880 Allergy status to penicillin: Secondary | ICD-10-CM

## 2018-01-24 DIAGNOSIS — I509 Heart failure, unspecified: Secondary | ICD-10-CM | POA: Diagnosis not present

## 2018-01-24 DIAGNOSIS — E669 Obesity, unspecified: Secondary | ICD-10-CM | POA: Diagnosis present

## 2018-01-24 DIAGNOSIS — I5033 Acute on chronic diastolic (congestive) heart failure: Secondary | ICD-10-CM | POA: Diagnosis present

## 2018-01-24 DIAGNOSIS — Z79899 Other long term (current) drug therapy: Secondary | ICD-10-CM

## 2018-01-24 DIAGNOSIS — Z6836 Body mass index (BMI) 36.0-36.9, adult: Secondary | ICD-10-CM

## 2018-01-24 DIAGNOSIS — Z7952 Long term (current) use of systemic steroids: Secondary | ICD-10-CM

## 2018-01-24 DIAGNOSIS — M7989 Other specified soft tissue disorders: Secondary | ICD-10-CM

## 2018-01-24 DIAGNOSIS — I10 Essential (primary) hypertension: Secondary | ICD-10-CM

## 2018-01-24 DIAGNOSIS — Z720 Tobacco use: Secondary | ICD-10-CM | POA: Diagnosis present

## 2018-01-24 DIAGNOSIS — I2729 Other secondary pulmonary hypertension: Secondary | ICD-10-CM | POA: Diagnosis present

## 2018-01-24 DIAGNOSIS — I5082 Biventricular heart failure: Secondary | ICD-10-CM | POA: Diagnosis present

## 2018-01-24 DIAGNOSIS — I110 Hypertensive heart disease with heart failure: Secondary | ICD-10-CM | POA: Diagnosis present

## 2018-01-24 DIAGNOSIS — F17210 Nicotine dependence, cigarettes, uncomplicated: Secondary | ICD-10-CM | POA: Diagnosis present

## 2018-01-24 DIAGNOSIS — D509 Iron deficiency anemia, unspecified: Secondary | ICD-10-CM | POA: Diagnosis present

## 2018-01-24 DIAGNOSIS — J9601 Acute respiratory failure with hypoxia: Secondary | ICD-10-CM | POA: Diagnosis not present

## 2018-01-24 DIAGNOSIS — R0902 Hypoxemia: Secondary | ICD-10-CM

## 2018-01-24 DIAGNOSIS — Z7982 Long term (current) use of aspirin: Secondary | ICD-10-CM

## 2018-01-24 HISTORY — DX: Chronic obstructive pulmonary disease, unspecified: J44.9

## 2018-01-24 LAB — URINALYSIS, ROUTINE W REFLEX MICROSCOPIC
Bilirubin Urine: NEGATIVE
Glucose, UA: NEGATIVE mg/dL
Hgb urine dipstick: NEGATIVE
Ketones, ur: NEGATIVE mg/dL
Leukocytes, UA: NEGATIVE
Nitrite: NEGATIVE
Protein, ur: NEGATIVE mg/dL
Specific Gravity, Urine: 1.041 — ABNORMAL HIGH (ref 1.005–1.030)
pH: 5 (ref 5.0–8.0)

## 2018-01-24 LAB — CBC
HCT: 41.9 % (ref 36.0–46.0)
Hemoglobin: 11.6 g/dL — ABNORMAL LOW (ref 12.0–15.0)
MCH: 23.9 pg — ABNORMAL LOW (ref 26.0–34.0)
MCHC: 27.7 g/dL — ABNORMAL LOW (ref 30.0–36.0)
MCV: 86.4 fL (ref 80.0–100.0)
Platelets: 238 10*3/uL (ref 150–400)
RBC: 4.85 MIL/uL (ref 3.87–5.11)
RDW: 19.5 % — ABNORMAL HIGH (ref 11.5–15.5)
WBC: 5.8 10*3/uL (ref 4.0–10.5)
nRBC: 0 % (ref 0.0–0.2)

## 2018-01-24 LAB — COMPREHENSIVE METABOLIC PANEL
ALT: 11 U/L (ref 0–44)
AST: 16 U/L (ref 15–41)
Albumin: 3.3 g/dL — ABNORMAL LOW (ref 3.5–5.0)
Alkaline Phosphatase: 59 U/L (ref 38–126)
Anion gap: 7 (ref 5–15)
BUN: 15 mg/dL (ref 8–23)
CO2: 30 mmol/L (ref 22–32)
Calcium: 8.4 mg/dL — ABNORMAL LOW (ref 8.9–10.3)
Chloride: 104 mmol/L (ref 98–111)
Creatinine, Ser: 0.85 mg/dL (ref 0.44–1.00)
GFR calc Af Amer: >60 mL/min (ref >60)
GFR calc non Af Amer: >60 mL/min (ref >60)
Glucose, Bld: 102 mg/dL — ABNORMAL HIGH (ref 70–99)
Potassium: 4.3 mmol/L (ref 3.5–5.1)
Sodium: 141 mmol/L (ref 135–145)
Total Bilirubin: 1.1 mg/dL (ref 0.3–1.2)
Total Protein: 7.3 g/dL (ref 6.5–8.1)

## 2018-01-24 LAB — BRAIN NATRIURETIC PEPTIDE: B Natriuretic Peptide: 410 pg/mL — ABNORMAL HIGH (ref 0.0–100.0)

## 2018-01-24 LAB — TROPONIN I
Troponin I: 0.06 ng/mL (ref <0.03)
Troponin I: 0.06 ng/mL (ref <0.03)
Troponin I: 0.05 ng/mL (ref <0.03)
Troponin I: 0.06 ng/mL (ref <0.03)

## 2018-01-24 LAB — LIPASE, BLOOD: Lipase: 28 U/L (ref 11–51)

## 2018-01-24 MED ORDER — AMLODIPINE BESYLATE 5 MG PO TABS
10.0000 mg | ORAL_TABLET | Freq: Every day | ORAL | Status: DC
  Administered 2018-01-25 – 2018-01-28 (×4): 10 mg via ORAL
  Filled 2018-01-24 (×4): qty 2

## 2018-01-24 MED ORDER — FOLIC ACID 1 MG PO TABS
1.0000 mg | ORAL_TABLET | Freq: Every day | ORAL | Status: DC
  Administered 2018-01-25 – 2018-01-28 (×4): 1 mg via ORAL
  Filled 2018-01-24 (×4): qty 1

## 2018-01-24 MED ORDER — FUROSEMIDE 10 MG/ML IJ SOLN
40.0000 mg | Freq: Two times a day (BID) | INTRAMUSCULAR | Status: DC
  Administered 2018-01-24 – 2018-01-28 (×8): 40 mg via INTRAVENOUS
  Filled 2018-01-24 (×9): qty 4

## 2018-01-24 MED ORDER — ONDANSETRON HCL 4 MG/2ML IJ SOLN: 4.0000 mg | Freq: Four times a day (QID) | INTRAMUSCULAR | Status: DC | PRN

## 2018-01-24 MED ORDER — ACETAMINOPHEN 650 MG RE SUPP: 650.0000 mg | Freq: Four times a day (QID) | RECTAL | Status: DC | PRN

## 2018-01-24 MED ORDER — NICOTINE 21 MG/24HR TD PT24: 21.0000 mg | MEDICATED_PATCH | Freq: Every day | TRANSDERMAL | Status: DC | PRN

## 2018-01-24 MED ORDER — ASPIRIN EC 81 MG PO TBEC
81.0000 mg | DELAYED_RELEASE_TABLET | Freq: Every day | ORAL | Status: DC
  Administered 2018-01-24 – 2018-01-27 (×4): 81 mg via ORAL
  Filled 2018-01-24 (×4): qty 1

## 2018-01-24 MED ORDER — IOPAMIDOL (ISOVUE-370) INJECTION 76%
100.0000 mL | Freq: Once | INTRAVENOUS | Status: AC | PRN
  Administered 2018-01-24: 100 mL via INTRAVENOUS

## 2018-01-24 MED ORDER — INFLUENZA VAC SPLIT HIGH-DOSE 0.5 ML IM SUSY
0.5000 mL | PREFILLED_SYRINGE | INTRAMUSCULAR | Status: DC
  Filled 2018-01-24: qty 0.5

## 2018-01-24 MED ORDER — IRBESARTAN 300 MG PO TABS
300.0000 mg | ORAL_TABLET | Freq: Every day | ORAL | Status: DC
  Administered 2018-01-25 – 2018-01-28 (×4): 300 mg via ORAL
  Filled 2018-01-24 (×4): qty 1

## 2018-01-24 MED ORDER — ENOXAPARIN SODIUM 40 MG/0.4ML ~~LOC~~ SOLN
40.0000 mg | SUBCUTANEOUS | Status: DC
  Administered 2018-01-24 – 2018-01-27 (×4): 40 mg via SUBCUTANEOUS
  Filled 2018-01-24 (×4): qty 0.4

## 2018-01-24 MED ORDER — IPRATROPIUM-ALBUTEROL 0.5-2.5 (3) MG/3ML IN SOLN: 3.0000 mL | RESPIRATORY_TRACT | Status: DC | PRN

## 2018-01-24 MED ORDER — ONDANSETRON HCL 4 MG PO TABS: 4.0000 mg | ORAL_TABLET | Freq: Four times a day (QID) | ORAL | Status: DC | PRN

## 2018-01-24 MED ORDER — ACETAMINOPHEN 325 MG PO TABS: 650.0000 mg | ORAL_TABLET | Freq: Four times a day (QID) | ORAL | Status: DC | PRN

## 2018-01-24 NOTE — H&P (Signed)
History and Physical   Marisa Bailey JSE:[id number] DOB: November 02, 1940 DOA: 01/24/2018  Referring MD/NP/PA: Dr. Rogene Houston, [place] PCP: Lucia Gaskins, MD  Patient coming from: Home  Chief Complaint: leg swelling, shortness of breath  HPI: Marisa Bailey is a 77 y.o. female with a history of COPD, HTN, obesity, tobacco use, and chronic CHF who presented to the ED 12/14 brought by her niece for increasing shortness of breath and leg swelling. She's noticed leg swelling for several weeks, which has worsened during this time, remains constant and now includes abdominal swelling as well. She does not take lasix as prescribed at home, does not wear compression stockings because they're too difficult to get on. She denies abdominal pain, just swelling. The dyspnea is some times denied by the patient and other times she'll mention that it's so hard to catch her breath. Her niece states that's been her main and increasing complaint for the past few days. No change in chronic cough, no fevers, chills, chest pain, palpitations, nausea, vomiting, or diarrhea.   ED Course: Hypoxic on arrival at rest requiring 2L and continued to desaturate on weaning attempts. Oddly she denied any significant shortness of breath when walking to the bathroom but had oxygen saturations squarely in the 70%'s. Evaluation including CTA chest ruled out PE, pneumonia. She was not wheezing significantly. Hospitalists consulted for new onset hypoxia. Lasix started.  Review of Systems: Equivocal on questions regarding orthopnea and PND. Otherwise denies fever, chills, weight loss, changes in vision or hearing, headache, cough, sore throat, chest pain, palpitations, abdominal pain, nausea, vomiting, changes in bowel habits, blood in stool, change in bladder habits, myalgias, arthralgias, and rash, and per HPI. All others reviewed and are negative.   Past Medical History:  Diagnosis Date  . Bronchitis   . COPD (chronic obstructive pulmonary  disease) ([place])   . Hypertension    Past Surgical History:  Procedure Laterality Date  . ENUCLEATION     - 1/2ppd, longterm, no EtOH or other drugs.  reports that she has been smoking cigarettes. She has been smoking about 0.50 packs per day. She has never used smokeless tobacco. She reports that she does not drink alcohol or use drugs. Allergies  Allergen Reactions  . Penicillins Rash    Has patient had a PCN reaction causing immediate rash, facial/tongue/throat swelling, SOB or lightheadedness with hypotension: {Yes/No:[id number] Has patient had a PCN reaction causing severe rash involving mucus membranes or skin necrosis: Yes Has patient had a PCN reaction that required hospitalization No Has patient had a PCN reaction occurring within the last 10 years: No If all of the above answers are "NO", then may proceed with Cephalosporin use.    History reviewed. No pertinent family history. - Family history otherwise reviewed and not pertinent. Prior to Admission medications   Medication Sig Start Date End Date Taking? Authorizing Provider  albuterol (PROVENTIL HFA;VENTOLIN HFA) 108 (90 Base) MCG/ACT inhaler Inhale 2 puffs into the lungs every 6 (six) hours. 06/30/17  Yes Dondiego, Richard, MD  amLODipine (NORVASC) 10 MG tablet Take 10 mg by mouth daily.   Yes [provider]  aspirin EC 81 MG tablet Take 81 mg by mouth at bedtime.    Yes [provider]  folic acid (FOLVITE) 1 MG tablet Take 1 mg by mouth daily.  06/11/17  Yes [provider]  olmesartan (BENICAR) 40 MG tablet Take 40 mg by mouth daily.   Yes [provider]  furosemide (LASIX) 20 MG tablet Take  1 tablet (20 mg total) by mouth daily. Patient not taking: Reported on 01/24/2018 06/30/17   Lucia Gaskins, MD  [facility] HFA 108 [phone] Base) MCG/ACT inhaler Inhale 1-2 puffs into the lungs every 4 (four) hours as needed for wheezing.  05/28/17   [provider]    Physical Exam: Vitals:    01/24/18 1200 01/24/18 1230 01/24/18 1400 01/24/18 1447  BP: (!) 149/68 (!) 155/66 (!) 168/65 (!) 155/70  Pulse: 60 63  70  Resp: 20 (!) 24    Temp:    97.6 F (36.4 C)  TempSrc:    Oral  SpO2: 94% 93%  94%  Weight:      Height:       Constitutional: 77 y.o. female in no distress, calm, jovial demeanor Eyes: Right enucleation remotely. Lids and conjunctiva normal, pupil round, reactive. ENMT: Mucous membranes are moist. Posterior pharynx clear of any exudate or lesions. fair dentition.  Neck: normal, supple, no masses, no thyromegaly Respiratory: Tachypneic, nonlabored, some end-expiratory wheezing without expiratory prolongation, decreased at bases.  Cardiovascular: Regular rate and rhythm, no murmurs, rubs, or gallops. No carotid bruits. Unable to determine JVD. 3+ pitting edema extending from feet up to and including abdominal wall. Palpable pedal pulses. Abdomen: Normoactive bowel sounds. No tenderness, protuberant, non-distended, and no masses palpated. No hepatosplenomegaly. GU: No indwelling catheter Musculoskeletal: No clubbing / cyanosis. No joint deformity upper and lower extremities. Good ROM, no contractures. Normal muscle tone.  Skin: Warm, dry. No rashes, wounds, or ulcers. No significant lesions noted.  Neurologic: CN II-XII grossly intact. Gait not assessed. Speech normal. No focal deficits in motor strength or sensation in all extremities.  Psychiatric: Alert and oriented x3. Normal judgment and insight. Mood euthymic with broad affect.   Labs on Admission: I have personally reviewed following labs and imaging studies  CBC: Recent Labs  Lab 01/24/18 0927  WBC 5.8  HGB 11.6*  HCT 41.9  MCV 86.4  PLT 283   Basic Metabolic Panel: Recent Labs  Lab 01/24/18 0927  NA 141  K 4.3  CL 104  CO2 30  GLUCOSE 102*  BUN 15  CREATININE 0.85  CALCIUM 8.4*   GFR: Estimated Creatinine Clearance: 59.9 mL/min (by C-G formula based on SCr of 0.85 mg/dL). Liver Function  Tests: Recent Labs  Lab 01/24/18 0927  AST 16  ALT 11  ALKPHOS 59  BILITOT 1.1  PROT 7.3  ALBUMIN 3.3*   Recent Labs  Lab 01/24/18 0927  LIPASE 28   No results for input(s): AMMONIA in the last 168 hours. Coagulation Profile: No results for input(s): INR, PROTIME in the last 168 hours. Cardiac Enzymes: Recent Labs  Lab 01/24/18 0926 01/24/18 1225  TROPONINI 0.06* 0.06*   BNP (last 3 results) No results for input(s): PROBNP in the last 8760 hours. HbA1C: No results for input(s): HGBA1C in the last 72 hours. CBG: No results for input(s): GLUCAP in the last 168 hours. Lipid Profile: No results for input(s): CHOL, HDL, LDLCALC, TRIG, CHOLHDL, LDLDIRECT in the last 72 hours. Thyroid Function Tests: No results for input(s): TSH, T4TOTAL, FREET4, T3FREE, THYROIDAB in the last 72 hours. Anemia Panel: No results for input(s): VITAMINB12, FOLATE, FERRITIN, TIBC, IRON, RETICCTPCT in the last 72 hours. Urine analysis:    Component Value Date/Time   COLORURINE YELLOW 01/24/2018 [place] 01/24/2018 1303   LABSPEC 1.041 (H) 01/24/2018 1303   PHURINE 5.0 01/24/2018 1303   GLUCOSEU NEGATIVE 01/24/2018 1303   HGBUR NEGATIVE 01/24/2018  [place] 01/24/2018 [place] 01/24/2018 1303   PROTEINUR NEGATIVE 01/24/2018 1303   NITRITE NEGATIVE 01/24/2018 1303   LEUKOCYTESUR NEGATIVE 01/24/2018 1303    No results found for this or any previous visit (from the past 240 hour(s)).   Radiological Exams on Admission: Dg Chest 2 View  Result Date: 01/24/2018 CLINICAL DATA:  Shortness of breath. EXAM: CHEST - 2 VIEW COMPARISON:  Radiograph of Jun 27, 2017. FINDINGS: Stable cardiomegaly with central pulmonary vascular congestion. No pneumothorax is noted. Mild bibasilar subsegmental atelectasis or edema is noted with possible minimal pleural effusions. Bony thorax is unremarkable. IMPRESSION: Stable cardiomegaly with central pulmonary  vascular congestion. Mild bibasilar subsegmental atelectasis or edema is noted with possible minimal pleural effusions. Electronically Signed   By: Marijo Conception, M.D.   On: 01/24/2018 10:55   Ct Angio Chest Pe W/cm &/or Wo Cm  Result Date: 01/24/2018 CLINICAL DATA:  Dyspnea. EXAM: CT ANGIOGRAPHY CHEST WITH CONTRAST TECHNIQUE: Multidetector CT imaging of the chest was performed using the standard protocol during bolus administration of intravenous contrast. Multiplanar CT image reconstructions and MIPs were obtained to evaluate the vascular anatomy. CONTRAST:  156m ISOVUE-370 IOPAMIDOL (ISOVUE-370) INJECTION 76% COMPARISON:  Radiographs of same day. FINDINGS: Cardiovascular: Satisfactory opacification of the pulmonary arteries to the segmental level. No evidence of pulmonary embolism. Mild cardiomegaly is noted. Coronary artery calcifications are noted. No pericardial effusion. Atherosclerosis of thoracic aorta is noted without aneurysm or dissection. Mediastinum/Nodes: No enlarged mediastinal, hilar, or axillary lymph nodes. Thyroid gland, trachea, and esophagus demonstrate no significant findings. Lungs/Pleura: No pneumothorax or pleural effusion is noted. Bronchiectasis is noted in the lower lobes bilaterally with reticular densities seen in the lung bases most consistent with scarring. Upper Abdomen: Mild amount of fluid is noted around the liver. Musculoskeletal: No chest wall abnormality. No acute or significant osseous findings. Review of the MIP images confirms the above findings. IMPRESSION: No definite evidence of pulmonary embolus. Coronary artery calcifications are noted suggesting coronary artery disease. Probable bronchiectasis and scarring seen in both lower lobes. Mild amount of fluid is seen around the liver. Aortic Atherosclerosis (ICD10-I70.0). Electronically Signed   By: JMarijo Conception M.D.   On: 01/24/2018 12:05    EKG: Independently reviewed. NSR with low voltage. Rightward  axis.  Assessment/Plan Principal Problem:   Acute respiratory failure with hypoxia (HCC) Active Problems:   COPD (chronic obstructive pulmonary disease) (HCC)   Acute exacerbation of CHF (congestive heart failure) (HCC)   Hypertension   Tobacco abuse   Obesity (BMI 30-39.9)   Acute hypoxic respiratory failure: Multifactorial. Due to acute on chronic diastolic and right heart failure, COPD and bronchiectasis with ongoing tobacco use, pulmonary HTN, and possible OHS/OSA.  - Plan to attempt wean oxygen after diuresis, neb treatments 12/15. With modest acute findings on CTA chest, query whether she may need oxygen at discharge.  Acute on chronic diastolic CHF and right heart failure: BNP elevated, may be lower than would be expected due to obesity. My personal review of CTA chest shows no PE, no focal infiltrate, lower lobe predominant bronchiectasis with hazy opacities thought to represent scarring, though may have element of pulmonary edema.  - Lasix 450mIV BID, this provided good diuresis during hospitalization in May 2019.  - Will not repeat echo performed May 2019 as pt reports these new symptoms in the setting of not taking lasix as directed. Not likely to be due to acute worsening of cardiac function. Last  echo showed EF 50-55%, G1DD, MVP without severe MR, PASP 65, and an RV that is severely dilated with severely reduced systolic function. - Strict I/O, daily weights - Heart healthy diet  COPD, bronchiectasis, pulmonary HTN: Some wheezing mildly on admission, but doubt this is primary cause of dyspnea.  - Duonebs q6h prn, hold steroids for now.  - May need augmentation/start of controller medications  Morbid obesity: BMI 36 - Lifestyle modification recommended - Consider sleep study due to her pulmonary HTN and habitus.   Elevated troponin: Chronic, at baseline, denies chest pain. Do not suspect this represents ACS with no ischemic changes on ECG and acute CHF likely causing  troponin leak.  - Trend  Tobacco use:  - Nicotine patch - Cessation counseling to be provided  DVT prophylaxis: Lovenox  Code Status: Full  Family Communication: Niece at bedside Disposition Plan: Home once hypoxia is resolved.  Consults called: None  Admission status: Observation    Marisa Pour, MD [facility] [email or web address] Password [email or web address] 01/24/2018, 3:03 PM

## 2018-01-24 NOTE — ED Notes (Signed)
CRITICAL VALUE ALERT  Critical Value:  Troponin 0.06  Date & Time Notied: 7867  Provider Notified: [email or web address]  Orders Received/Actions taken: none

## 2018-01-24 NOTE — ED Triage Notes (Addendum)
Pt reports abdominal pain and swelling for 2 weeks. Also feet swelling. Some SOB. O2 sat 85 % on room air. Placed on 2L via [place]

## 2018-01-24 NOTE — ED Notes (Signed)
Patient transported to X-ray 

## 2018-01-24 NOTE — ED Provider Notes (Signed)
[facility] EMERGENCY DEPARTMENT Provider Note   CSN: [id number] Arrival date & time: 01/24/18  0840     History   Chief Complaint Chief Complaint  Patient presents with  . Abdominal Pain    HPI Marisa Bailey is a 77 y.o. female.  Patient brought in by family members.  Triage mentioned abdominal pain and swelling for 2 weeks but patient says she is concerned about her leg swelling for the past 2 weeks.  And that she has some shortness of breath but no cough no fevers.  Upon arrival here her oxygen saturations were 85% on room air.  Patient was placed on 2 L nasal cannula.  Patient denies any chest pain.  Past medical history only significant for bronchitis and hypertension.  Patient is not on oxygen at home.  And never has been.     Past Medical History:  Diagnosis Date  . Bronchitis   . COPD (chronic obstructive pulmonary disease) ([place])   . Hypertension     Patient Active Problem List   Diagnosis Date Noted  . Acute respiratory failure with hypoxia ([place]) 06/27/2017  . COPD with acute exacerbation ([place]) 06/27/2017  . Acute exacerbation of CHF (congestive heart failure) ([place]) 06/27/2017  . Tobacco abuse 06/27/2017  . Hypertension   . Muscle weakness (generalized) 08/17/2012  . Pain in joint, shoulder region 08/17/2012  . Fx upper humerus-closed 08/17/2012    Past Surgical History:  Procedure Laterality Date  . ENUCLEATION       OB History   No obstetric history on file.      Home Medications    Prior to Admission medications   Medication Sig Start Date End Date Taking? Authorizing Provider  albuterol (PROVENTIL HFA;VENTOLIN HFA) 108 (90 Base) MCG/ACT inhaler Inhale 2 puffs into the lungs every 6 (six) hours. 06/30/17  Yes Dondiego, Richard, MD  amLODipine (NORVASC) 10 MG tablet Take 10 mg by mouth daily.   Yes [provider]  aspirin EC 81 MG tablet Take 81 mg by mouth at bedtime.    Yes [provider]  folic acid (FOLVITE) 1 MG tablet  Take 1 mg by mouth daily.  06/11/17  Yes [provider]  olmesartan (BENICAR) 40 MG tablet Take 40 mg by mouth daily.   Yes [provider]  furosemide (LASIX) 20 MG tablet Take 1 tablet (20 mg total) by mouth daily. Patient not taking: Reported on 01/24/2018 06/30/17   Lucia Gaskins, MD  predniSONE (DELTASONE) 20 MG tablet Take 2 tablets (40 mg total) by mouth daily with supper. 06/30/17   Lucia Gaskins, MD  [facility] HFA 108 [phone] Base) MCG/ACT inhaler Inhale 1-2 puffs into the lungs every 4 (four) hours as needed for wheezing.  05/28/17   [provider]    Family History No family history on file.  Social History Social History   Tobacco Use  . Smoking status: Current Every Day Smoker    Packs/day: 0.50    Types: Cigarettes  . Smokeless tobacco: Never Used  Substance Use Topics  . Alcohol use: No    Comment: rarely  . Drug use: No     Allergies   Penicillins   Review of Systems Review of Systems  Constitutional: Negative for fever.  HENT: Negative for congestion.   Respiratory: Positive for shortness of breath.   Cardiovascular: Positive for leg swelling. Negative for chest pain.  Gastrointestinal: Negative for abdominal pain.  Genitourinary: Negative for dysuria.  Musculoskeletal: Negative for myalgias.  Skin:  Negative for rash.  Allergic/Immunologic: Negative for immunocompromised state.  Neurological: Negative for headaches.  Hematological: Does not bruise/bleed easily.  Psychiatric/Behavioral: Negative for confusion.     Physical Exam Updated Vital Signs BP (!) 155/66   Pulse 63   Temp 98.4 F (36.9 C) (Oral)   Resp (!) 24   Ht 1.6 m (_0 )   Wt 92.3 kg   SpO2 93%   BMI 36.05 kg/m   Physical Exam Vitals signs and nursing note reviewed.  Constitutional:      General: She is not in acute distress.    Appearance: She is well-developed.  HENT:     Head: Normocephalic and atraumatic.     Mouth/Throat:     Mouth: Mucous  membranes are moist.  Eyes:     Comments: Partial closure of the right eyelid.  This is chronic.  Neck:     Musculoskeletal: Normal range of motion and neck supple.  Cardiovascular:     Rate and Rhythm: Normal rate and regular rhythm.  Pulmonary:     Effort: Pulmonary effort is normal. No respiratory distress.     Breath sounds: Normal breath sounds. No wheezing.  Abdominal:     General: Bowel sounds are normal.     Palpations: Abdomen is soft.     Tenderness: There is no abdominal tenderness.  Musculoskeletal:        General: Swelling present.     Right lower leg: Edema present.     Left lower leg: Edema present.  Skin:    General: Skin is warm.  Neurological:     Mental Status: She is alert and oriented to person, place, and time.     Sensory: No sensory deficit.     Motor: No weakness.      ED Treatments / Results  Labs (all labs ordered are listed, but only abnormal results are displayed) Labs Reviewed  COMPREHENSIVE METABOLIC PANEL - Abnormal; Notable for the following components:      Result Value   Glucose, Bld 102 (*)    Calcium 8.4 (*)    Albumin 3.3 (*)    All other components within normal limits  CBC - Abnormal; Notable for the following components:   Hemoglobin 11.6 (*)    MCH 23.9 (*)    MCHC 27.7 (*)    RDW 19.5 (*)    All other components within normal limits  BRAIN NATRIURETIC PEPTIDE - Abnormal; Notable for the following components:   B Natriuretic Peptide 410.0 (*)    All other components within normal limits  TROPONIN I - Abnormal; Notable for the following components:   Troponin I 0.06 (*)    All other components within normal limits  TROPONIN I - Abnormal; Notable for the following components:   Troponin I 0.06 (*)    All other components within normal limits  LIPASE, BLOOD  URINALYSIS, ROUTINE W REFLEX MICROSCOPIC    EKG EKG Interpretation  Date/Time:  Saturday January 24 2018 09:06:06 EST Ventricular Rate:  63 PR Interval:    QRS  Duration: 82 QT Interval:  381 QTC Calculation: 390 R Axis:   136 Text Interpretation:  Sinus rhythm Anterior infarct, old No significant change since last tracing Confirmed by Fredia Sorrow [phone]) on 01/24/2018 9:29:29 AM Also confirmed by Fredia Sorrow [phone]), editor Lynder Parents [phone])  on 01/24/2018 11:22:13 AM   Radiology Dg Chest 2 View  Result Date: 01/24/2018 CLINICAL DATA:  Shortness of breath. EXAM: CHEST - 2 VIEW COMPARISON:  Radiograph of Jun 27, 2017. FINDINGS: Stable cardiomegaly with central pulmonary vascular congestion. No pneumothorax is noted. Mild bibasilar subsegmental atelectasis or edema is noted with possible minimal pleural effusions. Bony thorax is unremarkable. IMPRESSION: Stable cardiomegaly with central pulmonary vascular congestion. Mild bibasilar subsegmental atelectasis or edema is noted with possible minimal pleural effusions. Electronically Signed   By: Marijo Conception, M.D.   On: 01/24/2018 10:55   Ct Angio Chest Pe W/cm &/or Wo Cm  Result Date: 01/24/2018 CLINICAL DATA:  Dyspnea. EXAM: CT ANGIOGRAPHY CHEST WITH CONTRAST TECHNIQUE: Multidetector CT imaging of the chest was performed using the standard protocol during bolus administration of intravenous contrast. Multiplanar CT image reconstructions and MIPs were obtained to evaluate the vascular anatomy. CONTRAST:  128m ISOVUE-370 IOPAMIDOL (ISOVUE-370) INJECTION 76% COMPARISON:  Radiographs of same day. FINDINGS: Cardiovascular: Satisfactory opacification of the pulmonary arteries to the segmental level. No evidence of pulmonary embolism. Mild cardiomegaly is noted. Coronary artery calcifications are noted. No pericardial effusion. Atherosclerosis of thoracic aorta is noted without aneurysm or dissection. Mediastinum/Nodes: No enlarged mediastinal, hilar, or axillary lymph nodes. Thyroid gland, trachea, and esophagus demonstrate no significant findings. Lungs/Pleura: No pneumothorax or pleural  effusion is noted. Bronchiectasis is noted in the lower lobes bilaterally with reticular densities seen in the lung bases most consistent with scarring. Upper Abdomen: Mild amount of fluid is noted around the liver. Musculoskeletal: No chest wall abnormality. No acute or significant osseous findings. Review of the MIP images confirms the above findings. IMPRESSION: No definite evidence of pulmonary embolus. Coronary artery calcifications are noted suggesting coronary artery disease. Probable bronchiectasis and scarring seen in both lower lobes. Mild amount of fluid is seen around the liver. Aortic Atherosclerosis (ICD10-I70.0). Electronically Signed   By: JMarijo Conception M.D.   On: 01/24/2018 12:05    Procedures Procedures (including critical care time)  Medications Ordered in ED Medications  iopamidol (ISOVUE-370) 76 % injection 100 mL (100 mLs Intravenous Contrast Given 01/24/18 1139)     Initial Impression / Assessment and Plan / ED Course  I have reviewed the triage vital signs and the nursing notes.  Pertinent labs & imaging results that were available during my care of the patient were reviewed by me and considered in my medical decision making (see chart for details).    Work-up for her oxygen requirement hypoxia patient requiring 2 L of oxygen to keep her sats into the low 90s.  Off oxygen patient's desats down into the 80s and even up into the upper 70s if she is walking.  Not able to find a cause for this.  Patient without any respiratory symptoms.  Her main complaint was shortness of breath and bilateral leg swelling.  CT angios chest negative for pulmonary embolism no evidence of any significant pulmonary edema but there could be subclinical edema.  Patient's troponins are chronically elevated.  2 of them here was 0.06.  They have been 0.07 in the past.  If not for the hypoxia patient would be stable for discharge home and follow-up with her regular doctor.  Discussed with  hospitalist they will admit.   Final Clinical Impressions(s) / ED Diagnoses   Final diagnoses:  Hypoxia  Leg swelling    ED Discharge Orders    None       ZFredia Sorrow MD 01/24/18 1402

## 2018-01-24 NOTE — Plan of Care (Signed)
Patient acclimated to room and unit policies. She verbalizes understanding. Patient has been having frequent urine output.

## 2018-01-24 NOTE — ED Notes (Signed)
Pt ambulated to bathroom and upon return, sats 70% on room air.  Placed back on 2L with improvement.  Pt was not obviously distressed or short of breath.

## 2018-01-24 NOTE — ED Notes (Signed)
Pt ambulated to BR and sats decreased 75 % on room air

## 2018-01-25 DIAGNOSIS — E669 Obesity, unspecified: Secondary | ICD-10-CM | POA: Diagnosis present

## 2018-01-25 DIAGNOSIS — J449 Chronic obstructive pulmonary disease, unspecified: Secondary | ICD-10-CM | POA: Diagnosis present

## 2018-01-25 DIAGNOSIS — I5033 Acute on chronic diastolic (congestive) heart failure: Secondary | ICD-10-CM | POA: Diagnosis present

## 2018-01-25 DIAGNOSIS — Z7982 Long term (current) use of aspirin: Secondary | ICD-10-CM | POA: Diagnosis not present

## 2018-01-25 DIAGNOSIS — I2729 Other secondary pulmonary hypertension: Secondary | ICD-10-CM | POA: Diagnosis present

## 2018-01-25 DIAGNOSIS — Z7952 Long term (current) use of systemic steroids: Secondary | ICD-10-CM | POA: Diagnosis not present

## 2018-01-25 DIAGNOSIS — J9601 Acute respiratory failure with hypoxia: Secondary | ICD-10-CM | POA: Diagnosis present

## 2018-01-25 DIAGNOSIS — I5082 Biventricular heart failure: Secondary | ICD-10-CM | POA: Diagnosis present

## 2018-01-25 DIAGNOSIS — F17210 Nicotine dependence, cigarettes, uncomplicated: Secondary | ICD-10-CM | POA: Diagnosis present

## 2018-01-25 DIAGNOSIS — Z880 Allergy status to penicillin: Secondary | ICD-10-CM | POA: Diagnosis not present

## 2018-01-25 DIAGNOSIS — R0902 Hypoxemia: Secondary | ICD-10-CM

## 2018-01-25 DIAGNOSIS — M7989 Other specified soft tissue disorders: Secondary | ICD-10-CM | POA: Diagnosis present

## 2018-01-25 DIAGNOSIS — I509 Heart failure, unspecified: Secondary | ICD-10-CM | POA: Diagnosis not present

## 2018-01-25 DIAGNOSIS — D509 Iron deficiency anemia, unspecified: Secondary | ICD-10-CM | POA: Diagnosis present

## 2018-01-25 DIAGNOSIS — I110 Hypertensive heart disease with heart failure: Secondary | ICD-10-CM | POA: Diagnosis present

## 2018-01-25 DIAGNOSIS — Z79899 Other long term (current) drug therapy: Secondary | ICD-10-CM | POA: Diagnosis not present

## 2018-01-25 DIAGNOSIS — Z6836 Body mass index (BMI) 36.0-36.9, adult: Secondary | ICD-10-CM | POA: Diagnosis not present

## 2018-01-25 LAB — IRON AND TIBC
Iron: 17 ug/dL — ABNORMAL LOW (ref 28–170)
Saturation Ratios: 4 % — ABNORMAL LOW (ref 10.4–31.8)
TIBC: 441 ug/dL (ref 250–450)
UIBC: 424 ug/dL

## 2018-01-25 LAB — FERRITIN: Ferritin: 13 ng/mL (ref 11–307)

## 2018-01-25 LAB — BASIC METABOLIC PANEL
Anion gap: 8 (ref 5–15)
BUN: 15 mg/dL (ref 8–23)
CO2: 32 mmol/L (ref 22–32)
Calcium: 8.4 mg/dL — ABNORMAL LOW (ref 8.9–10.3)
Chloride: 101 mmol/L (ref 98–111)
Creatinine, Ser: 0.75 mg/dL (ref 0.44–1.00)
GFR calc Af Amer: >60 mL/min (ref >60)
GFR calc non Af Amer: >60 mL/min (ref >60)
Glucose, Bld: 86 mg/dL (ref 70–99)
Potassium: 4.2 mmol/L (ref 3.5–5.1)
Sodium: 141 mmol/L (ref 135–145)

## 2018-01-25 LAB — VITAMIN B12: Vitamin B-12: 319 pg/mL (ref 180–914)

## 2018-01-25 MED ORDER — FERROUS SULFATE 325 (65 FE) MG PO TABS
325.0000 mg | ORAL_TABLET | Freq: Every day | ORAL | Status: DC
  Administered 2018-01-26 – 2018-01-28 (×3): 325 mg via ORAL
  Filled 2018-01-25 (×4): qty 1

## 2018-01-25 MED ORDER — SENNOSIDES-DOCUSATE SODIUM 8.6-50 MG PO TABS
1.0000 | ORAL_TABLET | Freq: Every day | ORAL | Status: DC
  Administered 2018-01-26 – 2018-01-28 (×3): 1 via ORAL
  Filled 2018-01-25 (×3): qty 1

## 2018-01-25 NOTE — Progress Notes (Addendum)
PROGRESS NOTE  Marisa Bailey  OMV:[id number] DOB: 1941/02/09 DOA: 01/24/2018 PCP: Lucia Gaskins, MD   Brief Narrative: Marisa Bailey is a 77 y.o. female with a history of COPD, HTN, obesity, tobacco use, and chronic CHF who presented to the ED 12/14 brought by her niece for increasing shortness of breath and leg swelling. Hypoxic on arrival at rest requiring 2L and continued to desaturate on weaning attempts. Oddly she denied any significant shortness of breath when walking to the bathroom but had oxygen saturations squarely in the 70%'s. Evaluation including CTA chest ruled out PE, pneumonia. She was not wheezing significantly. Hospitalists consulted for new onset hypoxia. Lasix started with good diuresis. She remains overtly volume overloaded, however and hypoxic.   Assessment & Plan: Principal Problem:   Acute respiratory failure with hypoxia (HCC) Active Problems:   COPD (chronic obstructive pulmonary disease) (HCC)   Acute exacerbation of CHF (congestive heart failure) (HCC)   Hypertension   Tobacco abuse   Obesity (BMI 30-39.9)  Acute hypoxic respiratory failure: Multifactorial. Due to acute on chronic diastolic and right heart failure, COPD and bronchiectasis with ongoing tobacco use, pulmonary HTN, and possible OHS/OSA.  - Remains hypoxic. Continue diuresis as below. With modest acute findings on CTA chest, she will need ambulatory pulse oximetry prior to discharge once felt to be euvolemic to determine if she will require ongoing supplemental oxygen.  Acute on chronic diastolic CHF and right heart failure: BNP elevated, may be lower than would be expected due to obesity. My personal review of CTA chest shows no PE, no focal infiltrate, lower lobe predominant bronchiectasis with hazy opacities thought to represent scarring, though may have element of pulmonary edema.  - Continue lasix 33m IV BID, this provided good diuresis during hospitalization in May 2019. No I/O are charted,  though pt reports increased urination and weight is down. She is 200lbs which is above the 190lbs at last discharge and remains overloaded clinically. - Will not repeat echo performed May 2019 as pt reports these new symptoms in the setting of not taking lasix as directed. Not likely to be due to acute worsening of cardiac function. Last echo showed EF 50-55%, G1DD, MVP without severe MR, PASP 65, and an RV that is severely dilated with severely reduced systolic function. - Strict I/O, daily weights - Heart healthy diet  COPD, bronchiectasis, pulmonary HTN: Some wheezing mildly on admission, but doubt this is primary cause of dyspnea.  - Duonebs q6h prn, holding steroids for now as she's still not wheezing and this would work against diuresis efforts.  - May need augmentation/start of controller medications, defer to PCP at this time.  Morbid obesity: BMI 36 - Lifestyle modification recommended - Consider sleep study due to her pulmonary HTN and habitus.   Elevated troponin: Chronic, at baseline, denies chest pain. Do not suspect this represents ACS with no ischemic changes on ECG and acute CHF likely causing troponin leak.  - Trend is reassuring, flat. No further intervention planned at this time.  Tobacco use:  - Nicotine patch - Cessation counseling to be provided  Microcytic anemia, iron deficiency anemia: Iron low, 4% Tsat and TIBC at high end of normal.  - Start iron supplement with concomitant laxative.  DVT prophylaxis: Lovenox Code Status: Full Family Communication: None at bedside this AM Disposition Plan: The patient remains hypoxic despite aggressive IV diuresis during observation period. Volume status remains elevated, she is 10 pounds above presumed dry weight and will require ongoing IV diuresis  and close monitoring. It is reasonable and appropriate to change her status to inpatient. Anticipate discharge home in the next 48-72 hours depending on clinical  trajectory.  Consultants:   None  Procedures:   None  Antimicrobials:  None   Subjective: Short of breath this morning. Denies wheezing. Says her legs and belly are still swollen, maybe about 10% better, but no where near normal. No chest pain.  Objective: Vitals:   01/24/18 2105 01/25/18 0500 01/25/18 0532 01/25/18 1131  BP: 140/62  (!) 140/59 (!) 146/52  Pulse: 68  67 60  Resp: (!) 22  16   Temp: 97.9 F (36.6 C)  97.8 F (36.6 C) 98.2 F (36.8 C)  TempSrc: Oral  Oral Oral  SpO2: 96%  97% 93%  Weight:  90.8 kg    Height:       No intake or output data in the 24 hours ending 01/25/18 1438 Filed Weights   01/24/18 0851 01/25/18 0500  Weight: 92.3 kg 90.8 kg    Gen: 77 y.o. female in no distress Pulm: Tachypneic at rest on my exam despite 2L O2. Crackles at bases.  CV: Regular rate and rhythm. No murmur, rub, or gallop. Unable to determine JVD, pitting dependent edema continues up to thigh, abdominal edema is improved. GI: Abdomen soft, non-tender, non-distended, with normoactive bowel sounds. No organomegaly or masses felt. Ext: Warm, no deformities Skin: No rashes, lesions or ulcers Neuro: Alert and oriented. No focal neurological deficits. Psych: Judgement and insight appear normal. Mood & affect appropriate.   Data Reviewed: I have personally reviewed following labs and imaging studies  CBC: Recent Labs  Lab 01/24/18 0927  WBC 5.8  HGB 11.6*  HCT 41.9  MCV 86.4  PLT 462   Basic Metabolic Panel: Recent Labs  Lab 01/24/18 0927 01/25/18 0648  NA 141 141  K 4.3 4.2  CL 104 101  CO2 30 32  GLUCOSE 102* 86  BUN 15 15  CREATININE 0.85 0.75  CALCIUM 8.4* 8.4*   GFR: Estimated Creatinine Clearance: 63 mL/min (by C-G formula based on SCr of 0.75 mg/dL). Liver Function Tests: Recent Labs  Lab 01/24/18 0927  AST 16  ALT 11  ALKPHOS 59  BILITOT 1.1  PROT 7.3  ALBUMIN 3.3*   Recent Labs  Lab 01/24/18 0927  LIPASE 28   No results for  input(s): AMMONIA in the last 168 hours. Coagulation Profile: No results for input(s): INR, PROTIME in the last 168 hours. Cardiac Enzymes: Recent Labs  Lab 01/24/18 0926 01/24/18 1225 01/24/18 1721 01/24/18 2215  TROPONINI 0.06* 0.06* 0.05* 0.06*   BNP (last 3 results) No results for input(s): PROBNP in the last 8760 hours. HbA1C: No results for input(s): HGBA1C in the last 72 hours. CBG: No results for input(s): GLUCAP in the last 168 hours. Lipid Profile: No results for input(s): CHOL, HDL, LDLCALC, TRIG, CHOLHDL, LDLDIRECT in the last 72 hours. Thyroid Function Tests: No results for input(s): TSH, T4TOTAL, FREET4, T3FREE, THYROIDAB in the last 72 hours. Anemia Panel: Recent Labs    01/25/18 0649  VITAMINB12 319  FERRITIN 13  TIBC 441  IRON 17*   Urine analysis:    Component Value Date/Time   COLORURINE YELLOW 01/24/2018 [place] 01/24/2018 1303   LABSPEC 1.041 (H) 01/24/2018 1303   PHURINE 5.0 01/24/2018 1303   GLUCOSEU NEGATIVE 01/24/2018 1303   HGBUR NEGATIVE 01/24/2018 1303   BILIRUBINUR NEGATIVE 01/24/2018 1303   [place] 01/24/2018 1303  PROTEINUR NEGATIVE 01/24/2018 1303   NITRITE NEGATIVE 01/24/2018 1303   LEUKOCYTESUR NEGATIVE 01/24/2018 1303   No results found for this or any previous visit (from the past 240 hour(s)).    Radiology Studies: Dg Chest 2 View  Result Date: 01/24/2018 CLINICAL DATA:  Shortness of breath. EXAM: CHEST - 2 VIEW COMPARISON:  Radiograph of Jun 27, 2017. FINDINGS: Stable cardiomegaly with central pulmonary vascular congestion. No pneumothorax is noted. Mild bibasilar subsegmental atelectasis or edema is noted with possible minimal pleural effusions. Bony thorax is unremarkable. IMPRESSION: Stable cardiomegaly with central pulmonary vascular congestion. Mild bibasilar subsegmental atelectasis or edema is noted with possible minimal pleural effusions. Electronically Signed   By: Marijo Conception, M.D.    On: 01/24/2018 10:55   Ct Angio Chest Pe W/cm &/or Wo Cm  Result Date: 01/24/2018 CLINICAL DATA:  Dyspnea. EXAM: CT ANGIOGRAPHY CHEST WITH CONTRAST TECHNIQUE: Multidetector CT imaging of the chest was performed using the standard protocol during bolus administration of intravenous contrast. Multiplanar CT image reconstructions and MIPs were obtained to evaluate the vascular anatomy. CONTRAST:  149m ISOVUE-370 IOPAMIDOL (ISOVUE-370) INJECTION 76% COMPARISON:  Radiographs of same day. FINDINGS: Cardiovascular: Satisfactory opacification of the pulmonary arteries to the segmental level. No evidence of pulmonary embolism. Mild cardiomegaly is noted. Coronary artery calcifications are noted. No pericardial effusion. Atherosclerosis of thoracic aorta is noted without aneurysm or dissection. Mediastinum/Nodes: No enlarged mediastinal, hilar, or axillary lymph nodes. Thyroid gland, trachea, and esophagus demonstrate no significant findings. Lungs/Pleura: No pneumothorax or pleural effusion is noted. Bronchiectasis is noted in the lower lobes bilaterally with reticular densities seen in the lung bases most consistent with scarring. Upper Abdomen: Mild amount of fluid is noted around the liver. Musculoskeletal: No chest wall abnormality. No acute or significant osseous findings. Review of the MIP images confirms the above findings. IMPRESSION: No definite evidence of pulmonary embolus. Coronary artery calcifications are noted suggesting coronary artery disease. Probable bronchiectasis and scarring seen in both lower lobes. Mild amount of fluid is seen around the liver. Aortic Atherosclerosis (ICD10-I70.0). Electronically Signed   By: JMarijo Conception M.D.   On: 01/24/2018 12:05    Scheduled Meds: . amLODipine  10 mg Oral Daily  . aspirin EC  81 mg Oral QHS  . enoxaparin (LOVENOX) injection  40 mg Subcutaneous Q24H  . folic acid  1 mg Oral Daily  . furosemide  40 mg Intravenous BID  . Influenza vac split  quadrivalent PF  0.5 mL Intramuscular Tomorrow-1000  . irbesartan  300 mg Oral Daily   Continuous Infusions:   LOS: 0 days   Time spent: 25 minutes.  RPatrecia Pour MD [facility] [email or web address] Password [email or web address]12/15/2019, 2:38 PM

## 2018-01-26 LAB — BASIC METABOLIC PANEL
Anion gap: 7 (ref 5–15)
BUN: 14 mg/dL (ref 8–23)
CO2: 38 mmol/L — ABNORMAL HIGH (ref 22–32)
Calcium: 8.3 mg/dL — ABNORMAL LOW (ref 8.9–10.3)
Chloride: 97 mmol/L — ABNORMAL LOW (ref 98–111)
Creatinine, Ser: 0.66 mg/dL (ref 0.44–1.00)
GFR calc Af Amer: >60 mL/min (ref >60)
GFR calc non Af Amer: >60 mL/min (ref >60)
Glucose, Bld: 92 mg/dL (ref 70–99)
Potassium: 3.9 mmol/L (ref 3.5–5.1)
Sodium: 142 mmol/L (ref 135–145)

## 2018-01-26 NOTE — Progress Notes (Signed)
PROGRESS NOTE  Marisa Bailey  YWV:[id number] DOB: 09-21-1940 DOA: 01/24/2018 PCP: Lucia Gaskins, MD   Brief Narrative: Marisa Bailey is a 77 y.o. female with a history of COPD, HTN, obesity, tobacco use, and chronic CHF who presented to the ED 12/14 brought by her niece for increasing shortness of breath and leg swelling. Hypoxic on arrival at rest requiring 2L and continued to desaturate on weaning attempts. Oddly she denied any significant shortness of breath when walking to the bathroom but had oxygen saturations squarely in the 70%'s. Evaluation including CTA chest ruled out PE, pneumonia. She was not wheezing significantly. Hospitalists consulted for new onset hypoxia. Lasix started with good diuresis. Diuresis has continued to be sufficient, though hypoxia and peripheral volume overload continues.   Assessment & Plan: Principal Problem:   Acute respiratory failure with hypoxia (HCC) Active Problems:   COPD (chronic obstructive pulmonary disease) (HCC)   Acute exacerbation of CHF (congestive heart failure) (HCC)   Hypertension   Tobacco abuse   Obesity (BMI 30-39.9)   Hypoxia   Leg swelling  Acute hypoxic respiratory failure: Multifactorial. Due to acute on chronic diastolic and right heart failure, COPD and bronchiectasis with ongoing tobacco use, pulmonary HTN, and possible OHS/OSA.  - Exam improving, but remains w/crackles. Plan to check ambulatory pulse oximetry 12/17 if exam continues to improve.   Acute on chronic diastolic CHF and right heart failure: BNP elevated, may be lower than would be expected due to obesity. My personal review of CTA chest shows no PE, no focal infiltrate, lower lobe predominant bronchiectasis with hazy opacities thought to represent scarring, though may have element of pulmonary edema.  - No change: Continue lasix 58m IV BID. Weight is down, creatinine is down. She is 193lbs, still slightly above presumed EDW.  - Will not repeat echo performed May  2019 as pt reports these new symptoms in the setting of not taking lasix as directed. Not likely to be due to acute worsening of cardiac function. Last echo showed EF 50-55%, G1DD, MVP without severe MR, PASP 65, and an RV that is severely dilated with severely reduced systolic function. - Strict I/O, daily weights - Heart healthy diet  COPD, bronchiectasis, pulmonary HTN: Some wheezing mildly on admission, but doubt this is primary cause of dyspnea.  - Duonebs q6h prn, holding steroids for now as she's still not wheezing and this would work against diuresis efforts.  - May need augmentation/start of controller medications, defer to PCP at this time.  Morbid obesity: BMI 36 - Lifestyle modification recommended - Consider sleep study due to her pulmonary HTN and habitus.   Elevated troponin: Chronic, at baseline, denies chest pain. Do not suspect this represents ACS with no ischemic changes on ECG and acute CHF likely causing troponin leak.  - Trend is reassuring, flat. No further intervention planned at this time.  Tobacco use:  - Nicotine patch - Cessation counseling to be provided  Microcytic anemia, iron deficiency anemia: Iron low, 4% Tsat and TIBC at high end of normal.  - Started iron supplement with concomitant laxative.  DVT prophylaxis: Lovenox Code Status: Full Family Communication: None at bedside this AM Disposition Plan: Continue IV diuresis, hopeful for discharge home in 24-48 hours.   Consultants:   None  Procedures:   None  Antimicrobials:  None   Subjective: Feels generally better. Breathing more easily, but still short of breath with exertion and has some orthopnea. These are not her baseline. Legs are still swollen, but  abdominal swelling resolved.   Objective: Vitals:   01/25/18 2233 01/26/18 0619 01/26/18 0926 01/26/18 1428  BP: (!) 149/55 (!) 134/58  (!) 121/46  Pulse: 66 64  73  Resp: 16 16  (!) 21  Temp: 99 F (37.2 C) (!) 97.5 F (36.4 C)   98.4 F (36.9 C)  TempSrc: Oral Oral  Oral  SpO2: 96% 96% 92% 95%  Weight:  87.7 kg    Height:        Intake/Output Summary (Last 24 hours) at 01/26/2018 1545 Last data filed at 01/26/2018 1500 Gross per 24 hour  Intake 480 ml  Output 2580 ml  Net -2100 ml   Filed Weights   01/24/18 0851 01/25/18 0500 01/26/18 0619  Weight: 92.3 kg 90.8 kg 87.7 kg   Gen: 77 y.o. female in no distress Eyes: Right enucleation.  Pulm: Nonlabored on 2LPM at rest. Bibasilar crackles are scant but present, no wheezing. CV: Regular rate and rhythm. No murmur, rub, or gallop. pitting dependent edema to midlegs bilaterally. GI: Abdomen soft, non-tender, non-distended, with normoactive bowel sounds.  Ext: Warm, no deformities Skin: No new rashes, lesions or ulcers on visualized skin.  Neuro: Alert and oriented. No focal neurological deficits. Psych: Judgement and insight appear fair. Mood euthymic & affect congruent. Behavior is appropriate.    Data Reviewed: I have personally reviewed following labs and imaging studies  CBC: Recent Labs  Lab 01/24/18 0927  WBC 5.8  HGB 11.6*  HCT 41.9  MCV 86.4  PLT 179   Basic Metabolic Panel: Recent Labs  Lab 01/24/18 0927 01/25/18 0648 01/26/18 0547  NA 141 141 142  K 4.3 4.2 3.9  CL 104 101 97*  CO2 30 32 38*  GLUCOSE 102* 86 92  BUN _0 CREATININE 0.85 0.75 0.66  CALCIUM 8.4* 8.4* 8.3*   GFR: Estimated Creatinine Clearance: 61.8 mL/min (by C-G formula based on SCr of 0.66 mg/dL). Liver Function Tests: Recent Labs  Lab 01/24/18 0927  AST 16  ALT 11  ALKPHOS 59  BILITOT 1.1  PROT 7.3  ALBUMIN 3.3*   Recent Labs  Lab 01/24/18 0927  LIPASE 28   No results for input(s): AMMONIA in the last 168 hours. Coagulation Profile: No results for input(s): INR, PROTIME in the last 168 hours. Cardiac Enzymes: Recent Labs  Lab 01/24/18 0926 01/24/18 1225 01/24/18 1721 01/24/18 2215  TROPONINI 0.06* 0.06* 0.05* 0.06*   BNP (last 3  results) No results for input(s): PROBNP in the last 8760 hours. HbA1C: No results for input(s): HGBA1C in the last 72 hours. CBG: No results for input(s): GLUCAP in the last 168 hours. Lipid Profile: No results for input(s): CHOL, HDL, LDLCALC, TRIG, CHOLHDL, LDLDIRECT in the last 72 hours. Thyroid Function Tests: No results for input(s): TSH, T4TOTAL, FREET4, T3FREE, THYROIDAB in the last 72 hours. Anemia Panel: Recent Labs    01/25/18 0649  VITAMINB12 319  FERRITIN 13  TIBC 441  IRON 17*   Urine analysis:    Component Value Date/Time   COLORURINE YELLOW 01/24/2018 [place] 01/24/2018 1303   LABSPEC 1.041 (H) 01/24/2018 1303   PHURINE 5.0 01/24/2018 1303   GLUCOSEU NEGATIVE 01/24/2018 1303   HGBUR NEGATIVE 01/24/2018 1303   BILIRUBINUR NEGATIVE 01/24/2018 1303   KETONESUR NEGATIVE 01/24/2018 1303   PROTEINUR NEGATIVE 01/24/2018 1303   NITRITE NEGATIVE 01/24/2018 1303   LEUKOCYTESUR NEGATIVE 01/24/2018 1303   No results found for this or any previous visit (from the  past 240 hour(s)).    Radiology Studies: No results found.  Scheduled Meds: . amLODipine  10 mg Oral Daily  . aspirin EC  81 mg Oral QHS  . enoxaparin (LOVENOX) injection  40 mg Subcutaneous Q24H  . ferrous sulfate  325 mg Oral Q breakfast  . folic acid  1 mg Oral Daily  . furosemide  40 mg Intravenous BID  . Influenza vac split quadrivalent PF  0.5 mL Intramuscular Tomorrow-1000  . irbesartan  300 mg Oral Daily  . senna-docusate  1 tablet Oral Daily   Continuous Infusions:   LOS: 1 day   Time spent: 25 minutes.  Patrecia Pour, MD [facility] [email or web address] Password [facility] 01/26/2018, 3:45 PM

## 2018-01-27 LAB — BASIC METABOLIC PANEL
Anion gap: 9 (ref 5–15)
BUN: 12 mg/dL (ref 8–23)
CO2: 40 mmol/L — ABNORMAL HIGH (ref 22–32)
Calcium: 8.4 mg/dL — ABNORMAL LOW (ref 8.9–10.3)
Chloride: 92 mmol/L — ABNORMAL LOW (ref 98–111)
Creatinine, Ser: 0.71 mg/dL (ref 0.44–1.00)
GFR calc Af Amer: >60 mL/min (ref >60)
GFR calc non Af Amer: >60 mL/min (ref >60)
Glucose, Bld: 87 mg/dL (ref 70–99)
Potassium: 3.5 mmol/L (ref 3.5–5.1)
Sodium: 141 mmol/L (ref 135–145)

## 2018-01-27 NOTE — Progress Notes (Addendum)
PROGRESS NOTE  Marisa Bailey  BFX:[id number] DOB: 16-May-1940 DOA: 01/24/2018 PCP: Lucia Gaskins, MD   Brief Narrative: Marisa Bailey is a 77 y.o. female with a history of COPD, HTN, obesity, tobacco use, and chronic CHF who presented to the ED 12/14 brought by her niece for increasing shortness of breath and leg swelling. Hypoxic on arrival at rest requiring 2L and continued to desaturate on weaning attempts. Oddly she denied any significant shortness of breath when walking to the bathroom but had oxygen saturations squarely in the 70%'s. Evaluation including CTA chest ruled out PE, pneumonia. She was not wheezing significantly. Hospitalists consulted for new onset hypoxia. Lasix started with good diuresis. Diuresis has continued to be sufficient, though hypoxia and peripheral volume overload continues.   Assessment & Plan: Principal Problem:   Acute respiratory failure with hypoxia (HCC) Active Problems:   COPD (chronic obstructive pulmonary disease) (HCC)   Acute exacerbation of CHF (congestive heart failure) (HCC)   Hypertension   Tobacco abuse   Obesity (BMI 30-39.9)   Hypoxia   Leg swelling  Acute hypoxic respiratory failure: Multifactorial. Due to acute on chronic diastolic and right heart failure, COPD and bronchiectasis with ongoing tobacco use, pulmonary HTN, and possible OHS/OSA.  - Exam improving but still with exam evidence of pulmonary edema and hypoxic. Will wean from oxygen as able over next 24 hours  Acute on chronic diastolic CHF and right heart failure: BNP elevated, may be lower than would be expected due to obesity. My personal review of CTA chest shows no PE, no focal infiltrate, lower lobe predominant bronchiectasis with hazy opacities thought to represent scarring, though may have element of pulmonary edema.  - Creatinine clearance remains >92m/min with BUN 12. Still with volume overload despite effective diuresis (previously EDW 190lbs, currently at 188lbs),  continues to have good UOP. - Will not repeat echo performed May 2019 as pt reports these new symptoms in the setting of not taking lasix as directed. Not likely to be due to acute worsening of cardiac function. Last echo showed EF 50-55%, G1DD, MVP without severe MR, PASP 65, and an RV that is severely dilated with severely reduced systolic function. - Strict I/O, daily weights - Heart healthy diet  COPD, bronchiectasis, pulmonary HTN: - Duonebs q6h prn, wheezing is resolved, holding steroids for now as she's still not wheezing and this would work against diuresis efforts.  - May need augmentation/start of controller medications, defer to PCP at this time.  Morbid obesity: BMI 36 - Lifestyle modification recommended - Consider sleep study due to her pulmonary HTN and habitus.   Elevated troponin: Chronic, at baseline, denies chest pain. Do not suspect this represents ACS with no ischemic changes on ECG and acute CHF likely causing troponin leak.  - Trend is reassuring, flat. No further intervention planned at this time.  Tobacco use:  - Nicotine patch - Cessation counseling to be provided  Microcytic anemia, iron deficiency anemia: Iron low, 4% Tsat and TIBC at high end of normal.  - Started iron supplement with concomitant laxative.  Missed beat: Note telemetry overnight showing a single untransmitted p wave.  - Continue telemetry.  DVT prophylaxis: Lovenox Code Status: Full Family Communication: None at bedside this AM Disposition Plan: Continue IV diuresis as she remains volume overloaded with hypoxia and crackles on exam despite being below previously estimated dry weight. Anticipate attempt at weaning oxygen once crackles resolved and discharge home.  Consultants:   None  Procedures:   None  Antimicrobials:  None   Subjective: Breathing a little better, but stays winded with exertion which is not her baseline. Legs swelling still severe, tender. No chest  pain  Objective: Vitals:   01/26/18 2157 01/27/18 0500 01/27/18 0627 01/27/18 1039  BP: (!) 140/59  (!) 153/61 (!) 145/68  Pulse: 70  72   Resp: 20  20   Temp: 98.7 F (37.1 C)  98.1 F (36.7 C)   TempSrc: Oral  Oral   SpO2: 100%     Weight:  85.6 kg    Height:        Intake/Output Summary (Last 24 hours) at 01/27/2018 1258 Last data filed at 01/27/2018 5747 Gross per 24 hour  Intake 240 ml  Output 2050 ml  Net -1810 ml   Filed Weights   01/25/18 0500 01/26/18 0619 01/27/18 0500  Weight: 90.8 kg 87.7 kg 85.6 kg   Gen: 77 y.o. female in no distress Eye: Right enucleation Pulm: Nonlabored tachypnea when laying back (HOB at 30 deg) on 2L. Crackles bibasilar. CV: Regular rate and rhythm. No murmur, rub, or gallop. Legs not as tense as previous exams, but still at least 1+ pitting symmetrically in legs GI: Abdomen soft, non-tender, non-distended, with normoactive bowel sounds.  Ext: Warm, no deformities Skin: No rashes, lesions or ulcers on visualized skin.  Neuro: Alert and oriented. No focal neurological deficits. Psych: Judgement and insight appear fair. Mood euthymic & affect congruent. Behavior is appropriate.    Data Reviewed: I have personally reviewed following labs and imaging studies  CBC: Recent Labs  Lab 01/24/18 0927  WBC 5.8  HGB 11.6*  HCT 41.9  MCV 86.4  PLT 340   Basic Metabolic Panel: Recent Labs  Lab 01/24/18 0927 01/25/18 0648 01/26/18 0547 01/27/18 0452  NA 141 141 142 141  K 4.3 4.2 3.9 3.5  CL 104 101 97* 92*  CO2 30 32 38* 40*  GLUCOSE 102* 86 92 87  BUN _0 CREATININE 0.85 0.75 0.66 0.71  CALCIUM 8.4* 8.4* 8.3* 8.4*   GFR: Estimated Creatinine Clearance: 61.1 mL/min (by C-G formula based on SCr of 0.71 mg/dL). Liver Function Tests: Recent Labs  Lab 01/24/18 0927  AST 16  ALT 11  ALKPHOS 59  BILITOT 1.1  PROT 7.3  ALBUMIN 3.3*   Recent Labs  Lab 01/24/18 0927  LIPASE 28   No results for input(s): AMMONIA  in the last 168 hours. Coagulation Profile: No results for input(s): INR, PROTIME in the last 168 hours. Cardiac Enzymes: Recent Labs  Lab 01/24/18 0926 01/24/18 1225 01/24/18 1721 01/24/18 2215  TROPONINI 0.06* 0.06* 0.05* 0.06*   BNP (last 3 results) No results for input(s): PROBNP in the last 8760 hours. HbA1C: No results for input(s): HGBA1C in the last 72 hours. CBG: No results for input(s): GLUCAP in the last 168 hours. Lipid Profile: No results for input(s): CHOL, HDL, LDLCALC, TRIG, CHOLHDL, LDLDIRECT in the last 72 hours. Thyroid Function Tests: No results for input(s): TSH, T4TOTAL, FREET4, T3FREE, THYROIDAB in the last 72 hours. Anemia Panel: Recent Labs    01/25/18 0649  VITAMINB12 319  FERRITIN 13  TIBC 441  IRON 17*   Urine analysis:    Component Value Date/Time   COLORURINE YELLOW 01/24/2018 [place] 01/24/2018 1303   LABSPEC 1.041 (H) 01/24/2018 1303   PHURINE 5.0 01/24/2018 1303   GLUCOSEU NEGATIVE 01/24/2018 1303   HGBUR NEGATIVE 01/24/2018 1303   [place] 01/24/2018 1303  KETONESUR NEGATIVE 01/24/2018 1303   PROTEINUR NEGATIVE 01/24/2018 1303   NITRITE NEGATIVE 01/24/2018 1303   LEUKOCYTESUR NEGATIVE 01/24/2018 1303   No results found for this or any previous visit (from the past 240 hour(s)).    Radiology Studies: No results found.  Scheduled Meds: . amLODipine  10 mg Oral Daily  . aspirin EC  81 mg Oral QHS  . enoxaparin (LOVENOX) injection  40 mg Subcutaneous Q24H  . ferrous sulfate  325 mg Oral Q breakfast  . folic acid  1 mg Oral Daily  . furosemide  40 mg Intravenous BID  . Influenza vac split quadrivalent PF  0.5 mL Intramuscular Tomorrow-1000  . irbesartan  300 mg Oral Daily  . senna-docusate  1 tablet Oral Daily   Continuous Infusions:   LOS: 2 days   Time spent: 25 minutes.  Patrecia Pour, MD [facility] [email or web address] Password [facility] 01/27/2018, 12:58 PM

## 2018-01-28 LAB — BASIC METABOLIC PANEL
Anion gap: 13 (ref 5–15)
BUN: 13 mg/dL (ref 8–23)
CO2: 37 mmol/L — ABNORMAL HIGH (ref 22–32)
Calcium: 8.8 mg/dL — ABNORMAL LOW (ref 8.9–10.3)
Chloride: 89 mmol/L — ABNORMAL LOW (ref 98–111)
Creatinine, Ser: 0.73 mg/dL (ref 0.44–1.00)
GFR calc Af Amer: >60 mL/min (ref >60)
GFR calc non Af Amer: >60 mL/min (ref >60)
Glucose, Bld: 97 mg/dL (ref 70–99)
Potassium: 3.4 mmol/L — ABNORMAL LOW (ref 3.5–5.1)
Sodium: 139 mmol/L (ref 135–145)

## 2018-01-28 MED ORDER — FUROSEMIDE 20 MG PO TABS: 20.0000 mg | ORAL_TABLET | Freq: Every day | ORAL | 3 refills | Status: DC

## 2018-01-28 NOTE — Care Management Important Message (Signed)
Important Message  Patient Details  Name: Marisa Bailey MRN: [id number] Date of Birth: 30-May-1940   Medicare Important Message Given:  Yes    Sherald Barge, RN 01/28/2018, 1:56 PM

## 2018-01-28 NOTE — Evaluation (Signed)
Physical Therapy Evaluation Patient Details Name: Marisa Bailey MRN: [id number] DOB: Jan 27, 1941 Today's Date: 01/28/2018   History of Present Illness  Marisa Bailey is a 77 y.o. female with a history of COPD, HTN, obesity, tobacco use, and chronic CHF who presented to the ED 12/14 brought by her niece for increasing shortness of breath and leg swelling. She's noticed leg swelling for several weeks, which has worsened during this time, remains constant and now includes abdominal swelling as well. She does not take lasix as prescribed at home, does not wear compression stockings because they're too difficult to get on. She denies abdominal pain, just swelling. The dyspnea is some times denied by the patient and other times she'll mention that it's so hard to catch her breath. Her niece states that's been her main and increasing complaint for the past few days. No change in chronic cough, no fevers, chills, chest pain, palpitations, nausea, vomiting, or diarrhea.     Clinical Impression  Patient functioning at baseline for functional mobility and gait.  Patient able to ambulate in hallways without loss of balance while on room air with O2 saturation remaining above 93%, but once sitting O2 saturation drops below 88% and put back on 3 LPM after therapy - RN notified.  Plan:  Patient discharged from physical therapy to care of nursing for ambulation daily as tolerated for length of stay.    Follow Up Recommendations No PT follow up    Equipment Recommendations  None recommended by PT    Recommendations for Other Services       Precautions / Restrictions Precautions Precautions: None Restrictions Weight Bearing Restrictions: No      Mobility  Bed Mobility Overal bed mobility: Independent                Transfers Overall transfer level: Independent                  Ambulation/Gait Ambulation/Gait assistance: Independent Gait Distance (Feet): 300 Feet Assistive device:  None Gait Pattern/deviations: WFL(Within Functional Limits) Gait velocity: normal   General Gait Details: Patient demonstrates good return for ambulation on level, inclined, and declined surfaces without loss of balance  Stairs            Wheelchair Mobility    Modified Rankin (Stroke Patients Only)       Balance Overall balance assessment: No apparent balance deficits (not formally assessed)                                           Pertinent Vitals/Pain Pain Assessment: No/denies pain    Home Living Family/patient expects to be discharged to:: Private residence Living Arrangements: Alone Available Help at Discharge: Family Type of Home: Mobile home Home Access: Stairs to enter Entrance Stairs-Rails: Right;Left;Can reach both Entrance Stairs-Number of Steps: 2-3 Home Layout: One level Home Equipment: Cane - single point      Prior Function Level of Independence: Independent               Hand Dominance   Dominant Hand: Right    Extremity/Trunk Assessment   Upper Extremity Assessment Upper Extremity Assessment: Overall WFL for tasks assessed    Lower Extremity Assessment Lower Extremity Assessment: Overall WFL for tasks assessed    Cervical / Trunk Assessment Cervical / Trunk Assessment: Normal  Communication   Communication: HOH  Cognition Arousal/Alertness: Awake/alert  Behavior During Therapy: WFL for tasks assessed/performed Overall Cognitive Status: Within Functional Limits for tasks assessed                                        General Comments      Exercises     Assessment/Plan    PT Assessment Patent does not need any further PT services  PT Problem List         PT Treatment Interventions      PT Goals (Current goals can be found in the Care Plan section)  Acute Rehab PT Goals Patient Stated Goal: return home PT Goal Formulation: With patient Time For Goal Achievement:  01/28/18 Potential to Achieve Goals: Good    Frequency     Barriers to discharge        Co-evaluation               AM-PAC PT "6 Clicks" Mobility  Outcome Measure Help needed turning from your back to your side while in a flat bed without using bedrails?: None Help needed moving from lying on your back to sitting on the side of a flat bed without using bedrails?: None Help needed moving to and from a bed to a chair (including a wheelchair)?: None Help needed standing up from a chair using your arms (e.g., wheelchair or bedside chair)?: None Help needed to walk in hospital room?: None Help needed climbing 3-5 steps with a railing? : None 6 Click Score: 24    End of Session   Activity Tolerance: Patient tolerated treatment well Patient left: in chair;with call bell/phone within reach Nurse Communication: Mobility status PT Visit Diagnosis: Unsteadiness on feet (R26.81);Other abnormalities of gait and mobility (R26.89);Muscle weakness (generalized) (M62.81)    Time: 0349-1791 PT Time Calculation (min) (ACUTE ONLY): 32 min   Charges:   PT Evaluation $PT Eval Moderate Complexity: 1 Mod PT Treatments $Therapeutic Activity: 23-37 mins        11:44 AM, 01/28/18 Lonell Grandchild, MPT Physical Therapist with [facility] 336 [phone] office [phone] mobile phone

## 2018-01-28 NOTE — Care Management Important Message (Signed)
Important Message  Patient Details  Name: Marisa Bailey MRN: [id number] Date of Birth: 03-26-1940   Medicare Important Message Given:  Yes    Shelda Altes 01/28/2018, 3:28 PM

## 2018-01-28 NOTE — Care Management Note (Signed)
Case Management Note  Patient Details  Name: Marisa Bailey MRN: [id number] Date of Birth: [birth year]  Subjective/Objective:      Pt is from home with strong family support. Has insurance and PCP. Has transportation to appointments. Has weaned from supplemental oxygen. CM initially discussed HH with pt and pt requested I talk to niece about it. CM was unable to contact niece until day of DC. Pt now doing better than expected, plans to drive herself home, no recommendations for PT follow up. Pt will DC home with self care. CM dicussed this with pt's niece. Niece says pt plans to drive herself home and she supports that.   Expected Discharge Date:  01/28/18               Expected Discharge Plan:  Home/Self Care  In-House Referral:  NA  Discharge planning Services  CM Consult  Post Acute Care Choice:  NA Choice offered to:  NA  Status of Service:  Completed, signed off  If discussed at Long Length of Stay Meetings, dates discussed:    Additional Comments:  Sherald Barge, RN 01/28/2018, 1:50 PM

## 2018-01-28 NOTE — Discharge Summary (Signed)
Physician Discharge Summary  Marisa Bailey IBB:[id number] DOB: 1940-04-30 DOA: 01/24/2018  PCP: Lucia Gaskins, MD  Admit date: 01/24/2018  Discharge date: 01/28/2018  Admitted From:Home  Disposition:  Home  Recommendations for Outpatient Follow-up:  1. Follow up with PCP in 1-2 weeks 2. Please obtain BMP in one week  Home Health:N/A  Equipment/Devices:None  Discharge Condition:Stable  CODE STATUS: Full  Diet recommendation: Heart Healthy  Brief/Interim Summary: Marisa E Cummingsis a 77 y.o.femalewith a history ofCOPD, HTN, obesity, tobacco use, and chronic CHF who presented to the ED 12/14 brought by her niece for increasing shortness of breath and leg swelling. Hypoxic on arrival at rest requiring 2Land continued to desaturate on weaning attempts. Oddly she denied any significant shortness of breath when walking to the bathroom but had oxygen saturations squarely in the 70%'s. Evaluation including CTAchest ruled out PE, pneumonia. She was not wheezing significantly. Hospitalistsconsulted for new onset hypoxia. Lasix started with good diuresis. Diuresis has continued to be sufficient, and she has had full resolutions of her symptomatology with no further hypoxemic respiratory failure noted.  She will need to remain on Lasix 20 mg daily and continue to monitor her daily weights as well as fluid and sodium intake which she has been educated on.  She needs to follow-up with her PCP in the next 1 week to have a repeat check on BMP.  She has ambulated with physical therapy with no home requirements noted.  She has been counseled on tobacco cessation.  Discharge Diagnoses:  Principal Problem:   Acute respiratory failure with hypoxia (HCC) Active Problems:   COPD (chronic obstructive pulmonary disease) (HCC)   Acute exacerbation of CHF (congestive heart failure) (HCC)   Hypertension   Tobacco abuse   Obesity (BMI 30-39.9)   Hypoxia   Leg swelling  Principal discharge  diagnosis: Acute hypoxemic respiratory failure-multifactorial.  Discharge Instructions  Discharge Instructions    Diet - low sodium heart healthy   Complete by:  As directed    Increase activity slowly   Complete by:  As directed      Allergies as of 01/28/2018      Reactions   Penicillins Rash   Has patient had a PCN reaction causing immediate rash, facial/tongue/throat swelling, SOB or lightheadedness with hypotension: {Yes/No:[id number] Has patient had a PCN reaction causing severe rash involving mucus membranes or skin necrosis: Yes Has patient had a PCN reaction that required hospitalization No Has patient had a PCN reaction occurring within the last 10 years: No If all of the above answers are "NO", then may proceed with Cephalosporin use.      Medication List    TAKE these medications   amLODipine 10 MG tablet Commonly known as:  NORVASC Take 10 mg by mouth daily.   aspirin EC 81 MG tablet Take 81 mg by mouth at bedtime.   folic acid 1 MG tablet Commonly known as:  FOLVITE Take 1 mg by mouth daily.   furosemide 20 MG tablet Commonly known as:  LASIX Take 1 tablet (20 mg total) by mouth daily.   olmesartan 40 MG tablet Commonly known as:  BENICAR Take 40 mg by mouth daily.   PROAIR HFA 108 (90 Base) MCG/ACT inhaler Generic drug:  albuterol Inhale 1-2 puffs into the lungs every 4 (four) hours as needed for wheezing.   albuterol 108 (90 Base) MCG/ACT inhaler Commonly known as:  PROVENTIL HFA;VENTOLIN HFA Inhale 2 puffs into the lungs every 6 (six) hours.  Follow-up Information    Lucia Gaskins, MD Follow up in 1 week(s).   Specialty:  Internal Medicine Why:  Repeat BMET at that time. Contact information: [place] [address] [phone]          Allergies  Allergen Reactions  . Penicillins Rash    Has patient had a PCN reaction causing immediate rash, facial/tongue/throat swelling, SOB or lightheadedness with  hypotension: {Yes/No:[id number] Has patient had a PCN reaction causing severe rash involving mucus membranes or skin necrosis: Yes Has patient had a PCN reaction that required hospitalization No Has patient had a PCN reaction occurring within the last 10 years: No If all of the above answers are "NO", then may proceed with Cephalosporin use.     Consultations:  None   Procedures/Studies: Dg Chest 2 View  Result Date: 01/24/2018 CLINICAL DATA:  Shortness of breath. EXAM: CHEST - 2 VIEW COMPARISON:  Radiograph of Jun 27, 2017. FINDINGS: Stable cardiomegaly with central pulmonary vascular congestion. No pneumothorax is noted. Mild bibasilar subsegmental atelectasis or edema is noted with possible minimal pleural effusions. Bony thorax is unremarkable. IMPRESSION: Stable cardiomegaly with central pulmonary vascular congestion. Mild bibasilar subsegmental atelectasis or edema is noted with possible minimal pleural effusions. Electronically Signed   By: Marijo Conception, M.D.   On: 01/24/2018 10:55   Ct Angio Chest Pe W/cm &/or Wo Cm  Result Date: 01/24/2018 CLINICAL DATA:  Dyspnea. EXAM: CT ANGIOGRAPHY CHEST WITH CONTRAST TECHNIQUE: Multidetector CT imaging of the chest was performed using the standard protocol during bolus administration of intravenous contrast. Multiplanar CT image reconstructions and MIPs were obtained to evaluate the vascular anatomy. CONTRAST:  175m ISOVUE-370 IOPAMIDOL (ISOVUE-370) INJECTION 76% COMPARISON:  Radiographs of same day. FINDINGS: Cardiovascular: Satisfactory opacification of the pulmonary arteries to the segmental level. No evidence of pulmonary embolism. Mild cardiomegaly is noted. Coronary artery calcifications are noted. No pericardial effusion. Atherosclerosis of thoracic aorta is noted without aneurysm or dissection. Mediastinum/Nodes: No enlarged mediastinal, hilar, or axillary lymph nodes. Thyroid gland, trachea, and esophagus demonstrate no significant  findings. Lungs/Pleura: No pneumothorax or pleural effusion is noted. Bronchiectasis is noted in the lower lobes bilaterally with reticular densities seen in the lung bases most consistent with scarring. Upper Abdomen: Mild amount of fluid is noted around the liver. Musculoskeletal: No chest wall abnormality. No acute or significant osseous findings. Review of the MIP images confirms the above findings. IMPRESSION: No definite evidence of pulmonary embolus. Coronary artery calcifications are noted suggesting coronary artery disease. Probable bronchiectasis and scarring seen in both lower lobes. Mild amount of fluid is seen around the liver. Aortic Atherosclerosis (ICD10-I70.0). Electronically Signed   By: JMarijo Conception M.D.   On: 01/24/2018 12:05     Discharge Exam: Vitals:   01/27/18 2140 01/28/18 0625  BP: (!) 153/63 139/61  Pulse: 78 73  Resp: 16 16  Temp: 99.1 F (37.3 C) 98.7 F (37.1 C)  SpO2: 94% 98%   Vitals:   01/27/18 1334 01/27/18 2140 01/28/18 0500 01/28/18 0625  BP: (!) 145/60 (!) 153/63  139/61  Pulse: 74 78  73  Resp: _0 Temp: 98.7 F (37.1 C) 99.1 F (37.3 C)  98.7 F (37.1 C)  TempSrc: Oral Oral  Oral  SpO2: 95% 94%  98%  Weight:   78.6 kg   Height:        General: Pt is alert, awake, not in acute distress Cardiovascular: RRR, S1/S2 +, no rubs, no gallops  Respiratory: CTA bilaterally, no wheezing, no rhonchi Abdominal: Soft, NT, ND, bowel sounds + Extremities: no edema, no cyanosis    The results of significant diagnostics from this hospitalization (including imaging, microbiology, ancillary and laboratory) are listed below for reference.     Microbiology: No results found for this or any previous visit (from the past 240 hour(s)).   Labs: BNP (last 3 results) Recent Labs    06/27/17 1220 01/24/18 0925  BNP 359.0* 611.6*   Basic Metabolic Panel: Recent Labs  Lab 01/24/18 0927 01/25/18 0648 01/26/18 0547 01/27/18 0452 01/28/18 0519   NA 141 141 142 141 139  K 4.3 4.2 3.9 3.5 3.4*  CL 104 101 97* 92* 89*  CO2 30 32 38* 40* 37*  GLUCOSE 102* 86 92 87 97  BUN _0 CREATININE 0.85 0.75 0.66 0.71 0.73  CALCIUM 8.4* 8.4* 8.3* 8.4* 8.8*   Liver Function Tests: Recent Labs  Lab 01/24/18 0927  AST 16  ALT 11  ALKPHOS 59  BILITOT 1.1  PROT 7.3  ALBUMIN 3.3*   Recent Labs  Lab 01/24/18 0927  LIPASE 28   No results for input(s): AMMONIA in the last 168 hours. CBC: Recent Labs  Lab 01/24/18 0927  WBC 5.8  HGB 11.6*  HCT 41.9  MCV 86.4  PLT 238   Cardiac Enzymes: Recent Labs  Lab 01/24/18 0926 01/24/18 1225 01/24/18 1721 01/24/18 2215  TROPONINI 0.06* 0.06* 0.05* 0.06*   BNP: Invalid input(s): POCBNP CBG: No results for input(s): GLUCAP in the last 168 hours. D-Dimer No results for input(s): DDIMER in the last 72 hours. Hgb A1c No results for input(s): HGBA1C in the last 72 hours. Lipid Profile No results for input(s): CHOL, HDL, LDLCALC, TRIG, CHOLHDL, LDLDIRECT in the last 72 hours. Thyroid function studies No results for input(s): TSH, T4TOTAL, T3FREE, THYROIDAB in the last 72 hours.  Invalid input(s): FREET3 Anemia work up No results for input(s): VITAMINB12, FOLATE, FERRITIN, TIBC, IRON, RETICCTPCT in the last 72 hours. Urinalysis    Component Value Date/Time   COLORURINE YELLOW 01/24/2018 1303   APPEARANCEUR CLEAR 01/24/2018 1303   LABSPEC 1.041 (H) 01/24/2018 1303   PHURINE 5.0 01/24/2018 1303   GLUCOSEU NEGATIVE 01/24/2018 1303   HGBUR NEGATIVE 01/24/2018 1303   BILIRUBINUR NEGATIVE 01/24/2018 1303   KETONESUR NEGATIVE 01/24/2018 1303   PROTEINUR NEGATIVE 01/24/2018 1303   NITRITE NEGATIVE 01/24/2018 1303   LEUKOCYTESUR NEGATIVE 01/24/2018 1303   Sepsis Labs Invalid input(s): PROCALCITONIN,  WBC,  LACTICIDVEN Microbiology No results found for this or any previous visit (from the past 240 hour(s)).   Time coordinating discharge: 35  minutes  SIGNED:   Rodena Goldmann, DO [facility] 01/28/2018, 12:23 PM Pager [phone]  If 7PM-7AM, please contact night-coverage [email or web address] Password [email or web address]

## 2018-08-01 ENCOUNTER — Encounter (HOSPITAL_COMMUNITY): Payer: Self-pay | Admitting: Emergency Medicine

## 2018-08-01 ENCOUNTER — Other Ambulatory Visit: Payer: Self-pay

## 2018-08-01 ENCOUNTER — Inpatient Hospital Stay (HOSPITAL_COMMUNITY)
Admission: EM | Admit: 2018-08-01 | Discharge: 2018-08-05 | DRG: 291 | Disposition: A | Discharge status: Home / Self Care | Payer: Medicare Other | Attending: Family Medicine | Admitting: Family Medicine

## 2018-08-01 ENCOUNTER — Emergency Department (HOSPITAL_COMMUNITY): Payer: Medicare Other

## 2018-08-01 DIAGNOSIS — R0602 Shortness of breath: Secondary | ICD-10-CM | POA: Diagnosis not present

## 2018-08-01 DIAGNOSIS — J9601 Acute respiratory failure with hypoxia: Secondary | ICD-10-CM | POA: Diagnosis present

## 2018-08-01 DIAGNOSIS — I340 Nonrheumatic mitral (valve) insufficiency: Secondary | ICD-10-CM | POA: Diagnosis not present

## 2018-08-01 DIAGNOSIS — Z880 Allergy status to penicillin: Secondary | ICD-10-CM | POA: Diagnosis not present

## 2018-08-01 DIAGNOSIS — Z79899 Other long term (current) drug therapy: Secondary | ICD-10-CM | POA: Diagnosis not present

## 2018-08-01 DIAGNOSIS — N390 Urinary tract infection, site not specified: Secondary | ICD-10-CM | POA: Diagnosis present

## 2018-08-01 DIAGNOSIS — Z7982 Long term (current) use of aspirin: Secondary | ICD-10-CM

## 2018-08-01 DIAGNOSIS — F17210 Nicotine dependence, cigarettes, uncomplicated: Secondary | ICD-10-CM | POA: Diagnosis present

## 2018-08-01 DIAGNOSIS — I5033 Acute on chronic diastolic (congestive) heart failure: Secondary | ICD-10-CM | POA: Diagnosis present

## 2018-08-01 DIAGNOSIS — I110 Hypertensive heart disease with heart failure: Secondary | ICD-10-CM | POA: Diagnosis present

## 2018-08-01 DIAGNOSIS — I509 Heart failure, unspecified: Secondary | ICD-10-CM

## 2018-08-01 DIAGNOSIS — I10 Essential (primary) hypertension: Secondary | ICD-10-CM | POA: Diagnosis present

## 2018-08-01 DIAGNOSIS — Z91128 Patient's intentional underdosing of medication regimen for other reason: Secondary | ICD-10-CM

## 2018-08-01 DIAGNOSIS — Z20828 Contact with and (suspected) exposure to other viral communicable diseases: Secondary | ICD-10-CM | POA: Diagnosis present

## 2018-08-01 DIAGNOSIS — J449 Chronic obstructive pulmonary disease, unspecified: Secondary | ICD-10-CM | POA: Diagnosis present

## 2018-08-01 DIAGNOSIS — I444 Left anterior fascicular block: Secondary | ICD-10-CM | POA: Diagnosis present

## 2018-08-01 DIAGNOSIS — T501X6A Underdosing of loop [high-ceiling] diuretics, initial encounter: Secondary | ICD-10-CM | POA: Diagnosis present

## 2018-08-01 DIAGNOSIS — J9602 Acute respiratory failure with hypercapnia: Secondary | ICD-10-CM

## 2018-08-01 DIAGNOSIS — K5900 Constipation, unspecified: Secondary | ICD-10-CM | POA: Diagnosis present

## 2018-08-01 DIAGNOSIS — N3000 Acute cystitis without hematuria: Secondary | ICD-10-CM

## 2018-08-01 DIAGNOSIS — I361 Nonrheumatic tricuspid (valve) insufficiency: Secondary | ICD-10-CM | POA: Diagnosis not present

## 2018-08-01 LAB — CBC WITH DIFFERENTIAL/PLATELET
Abs Immature Granulocytes: 0.02 10*3/uL (ref 0.00–0.07)
Basophils Absolute: 0 10*3/uL (ref 0.0–0.1)
Basophils Relative: 1 %
Eosinophils Absolute: 0.2 10*3/uL (ref 0.0–0.5)
Eosinophils Relative: 6 %
HCT: 44.8 % (ref 36.0–46.0)
Hemoglobin: 12.8 g/dL (ref 12.0–15.0)
Immature Granulocytes: 1 %
Lymphocytes Relative: 21 %
Lymphs Abs: 0.8 10*3/uL (ref 0.7–4.0)
MCH: 25.9 pg — ABNORMAL LOW (ref 26.0–34.0)
MCHC: 28.6 g/dL — ABNORMAL LOW (ref 30.0–36.0)
MCV: 90.5 fL (ref 80.0–100.0)
Monocytes Absolute: 0.4 10*3/uL (ref 0.1–1.0)
Monocytes Relative: 12 %
Neutro Abs: 2.3 10*3/uL (ref 1.7–7.7)
Neutrophils Relative %: 59 %
Platelets: 202 10*3/uL (ref 150–400)
RBC: 4.95 MIL/uL (ref 3.87–5.11)
RDW: 16.9 % — ABNORMAL HIGH (ref 11.5–15.5)
WBC: 3.7 10*3/uL — ABNORMAL LOW (ref 4.0–10.5)
nRBC: 0 % (ref 0.0–0.2)

## 2018-08-01 LAB — COMPREHENSIVE METABOLIC PANEL
ALT: 16 U/L (ref 0–44)
AST: 16 U/L (ref 15–41)
Albumin: 3.4 g/dL — ABNORMAL LOW (ref 3.5–5.0)
Alkaline Phosphatase: 85 U/L (ref 38–126)
Anion gap: 8 (ref 5–15)
BUN: 8 mg/dL (ref 8–23)
CO2: 30 mmol/L (ref 22–32)
Calcium: 8 mg/dL — ABNORMAL LOW (ref 8.9–10.3)
Chloride: 102 mmol/L (ref 98–111)
Creatinine, Ser: 0.59 mg/dL (ref 0.44–1.00)
GFR calc Af Amer: >60 mL/min (ref >60)
GFR calc non Af Amer: >60 mL/min (ref >60)
Glucose, Bld: 92 mg/dL (ref 70–99)
Potassium: 4 mmol/L (ref 3.5–5.1)
Sodium: 140 mmol/L (ref 135–145)
Total Bilirubin: 0.9 mg/dL (ref 0.3–1.2)
Total Protein: 7.2 g/dL (ref 6.5–8.1)

## 2018-08-01 LAB — URINALYSIS, ROUTINE W REFLEX MICROSCOPIC
Bilirubin Urine: NEGATIVE
Glucose, UA: NEGATIVE mg/dL
Hgb urine dipstick: NEGATIVE
Ketones, ur: NEGATIVE mg/dL
Leukocytes,Ua: NEGATIVE
Nitrite: NEGATIVE
Protein, ur: 30 mg/dL — AB
Specific Gravity, Urine: 1.011 (ref 1.005–1.030)
pH: 5 (ref 5.0–8.0)

## 2018-08-01 LAB — BRAIN NATRIURETIC PEPTIDE: B Natriuretic Peptide: 389 pg/mL — ABNORMAL HIGH (ref 0.0–100.0)

## 2018-08-01 LAB — LIPASE, BLOOD: Lipase: 26 U/L (ref 11–51)

## 2018-08-01 LAB — TROPONIN I: Troponin I: 0.06 ng/mL (ref <0.03)

## 2018-08-01 LAB — SARS CORONAVIRUS 2 BY RT PCR (HOSPITAL ORDER, PERFORMED IN ~~LOC~~ HOSPITAL LAB): SARS Coronavirus 2: NEGATIVE

## 2018-08-01 MED ORDER — SODIUM CHLORIDE 0.9% FLUSH
3.0000 mL | Freq: Two times a day (BID) | INTRAVENOUS | Status: DC
  Administered 2018-08-01 – 2018-08-05 (×8): 3 mL via INTRAVENOUS

## 2018-08-01 MED ORDER — IRBESARTAN 150 MG PO TABS
300.0000 mg | ORAL_TABLET | Freq: Every day | ORAL | Status: DC
  Administered 2018-08-01 – 2018-08-02 (×2): 300 mg via ORAL
  Filled 2018-08-01 (×2): qty 2

## 2018-08-01 MED ORDER — ALBUTEROL SULFATE (2.5 MG/3ML) 0.083% IN NEBU: 3.0000 mL | INHALATION_SOLUTION | RESPIRATORY_TRACT | Status: DC | PRN

## 2018-08-01 MED ORDER — ASPIRIN EC 81 MG PO TBEC
81.0000 mg | DELAYED_RELEASE_TABLET | Freq: Every day | ORAL | Status: DC
  Administered 2018-08-01 – 2018-08-04 (×4): 81 mg via ORAL
  Filled 2018-08-01 (×4): qty 1

## 2018-08-01 MED ORDER — ONDANSETRON HCL 4 MG/2ML IJ SOLN: 4.0000 mg | Freq: Four times a day (QID) | INTRAMUSCULAR | Status: DC | PRN

## 2018-08-01 MED ORDER — SODIUM CHLORIDE 0.9 % IV SOLN: 1.0000 g | Freq: Once | INTRAVENOUS | Status: DC

## 2018-08-01 MED ORDER — ENOXAPARIN SODIUM 40 MG/0.4ML ~~LOC~~ SOLN
40.0000 mg | SUBCUTANEOUS | Status: DC
  Administered 2018-08-01 – 2018-08-04 (×4): 40 mg via SUBCUTANEOUS
  Filled 2018-08-01 (×4): qty 0.4

## 2018-08-01 MED ORDER — SODIUM CHLORIDE 0.9 % IV SOLN: 250.0000 mL | INTRAVENOUS | Status: DC | PRN

## 2018-08-01 MED ORDER — POTASSIUM CHLORIDE CRYS ER 20 MEQ PO TBCR
40.0000 meq | EXTENDED_RELEASE_TABLET | Freq: Two times a day (BID) | ORAL | Status: DC
  Administered 2018-08-01 – 2018-08-03 (×4): 40 meq via ORAL
  Filled 2018-08-01 (×4): qty 2

## 2018-08-01 MED ORDER — FUROSEMIDE 10 MG/ML IJ SOLN
40.0000 mg | Freq: Two times a day (BID) | INTRAMUSCULAR | Status: DC
  Administered 2018-08-02 – 2018-08-05 (×7): 40 mg via INTRAVENOUS
  Filled 2018-08-01 (×7): qty 4

## 2018-08-01 MED ORDER — SODIUM CHLORIDE 0.9% FLUSH: 3.0000 mL | INTRAVENOUS | Status: DC | PRN

## 2018-08-01 MED ORDER — ACETAMINOPHEN 325 MG PO TABS
650.0000 mg | ORAL_TABLET | ORAL | Status: DC | PRN
  Administered 2018-08-03: 650 mg via ORAL
  Filled 2018-08-01: qty 2

## 2018-08-01 MED ORDER — IOHEXOL 300 MG/ML  SOLN
100.0000 mL | Freq: Once | INTRAMUSCULAR | Status: AC | PRN
  Administered 2018-08-01: 100 mL via INTRAVENOUS

## 2018-08-01 MED ORDER — AMLODIPINE BESYLATE 5 MG PO TABS
10.0000 mg | ORAL_TABLET | Freq: Every day | ORAL | Status: DC
  Administered 2018-08-01 – 2018-08-05 (×5): 10 mg via ORAL
  Filled 2018-08-01 (×5): qty 2

## 2018-08-01 MED ORDER — SODIUM CHLORIDE 0.9 % IV SOLN
1.0000 g | INTRAVENOUS | Status: DC
  Administered 2018-08-01 – 2018-08-03 (×3): 1 g via INTRAVENOUS
  Filled 2018-08-01 (×3): qty 10

## 2018-08-01 MED ORDER — FUROSEMIDE 10 MG/ML IJ SOLN
40.0000 mg | Freq: Once | INTRAMUSCULAR | Status: AC
  Administered 2018-08-01: 40 mg via INTRAVENOUS
  Filled 2018-08-01: qty 4

## 2018-08-01 NOTE — H&P (Signed)
History and Physical  Marisa Bailey WUX:[id number] DOB: [birth year] DOA: 08/01/2018  Referring physician: Rodell Perna, PA-C, ED provider PCP: Lucia Gaskins, MD  Outpatient Specialists:   Patient Coming From: home  Chief Complaint: SOB, abd pain  HPI: Marisa Bailey is a 78 y.o. female with a history of COPD, Hypertension. Here for shortness of breath that has been worsening over the past 2 to 3 days.  Breathing is worse with ambulation and exertion and improved with rest.  No other palliating or provoking factors.  Denies cough, wheezing, fevers, chills.  Admits to orthopnea symptoms are worsening.  Additionally, she has had some increased lower suprapubic pain without dysuria, but frequency.  Emergency Department Course: Chest x-ray shows pulmonary congestion.  Elevated BNP with stable troponin from previous of 0.06.  Review of Systems:   Pt denies any fevers, chills, nausea, vomiting, diarrhea, constipation, abdominal pain,palpitations, headache, vision changes, lightheadedness, dizziness, melena, rectal bleeding.  Review of systems are otherwise negative  Past Medical History:  Diagnosis Date  . Bronchitis   . COPD (chronic obstructive pulmonary disease) ([place])   . Hypertension    Past Surgical History:  Procedure Laterality Date  . ENUCLEATION     Social History:  reports that she has been smoking cigarettes. She has been smoking about 0.50 packs per day. She has never used smokeless tobacco. She reports that she does not drink alcohol or use drugs. Patient lives at home  Allergies  Allergen Reactions  . Penicillins Rash    Has patient had a PCN reaction causing immediate rash, facial/tongue/throat swelling, SOB or lightheadedness with hypotension: {Yes/No:[id number] Has patient had a PCN reaction causing severe rash involving mucus membranes or skin necrosis: Yes Has patient had a PCN reaction that required hospitalization No Has patient had a PCN reaction occurring  within the last 10 years: No If all of the above answers are "NO", then may proceed with Cephalosporin use.     No family history on file.  Family history of heart disease.  Prior to Admission medications   Medication Sig Start Date End Date Taking? Authorizing Provider  amLODipine (NORVASC) 10 MG tablet Take 10 mg by mouth daily.   Yes [provider]  aspirin EC 81 MG tablet Take 81 mg by mouth at bedtime.    Yes [provider]  folic acid (FOLVITE) 1 MG tablet Take 1 mg by mouth daily.  06/11/17  Yes [provider]  furosemide (LASIX) 20 MG tablet Take 1 tablet (20 mg total) by mouth daily. 01/28/18  Yes Shah, Pratik D, DO  olmesartan (BENICAR) 40 MG tablet Take 40 mg by mouth daily.   Yes [provider]  PROAIR HFA 108 (90 Base) MCG/ACT inhaler Inhale 1-2 puffs into the lungs every 4 (four) hours as needed for wheezing.  05/28/17  Yes [provider]    Physical Exam: BP (!) 151/53 (BP Location: Right Arm)   Pulse (!) 55   Temp 98.3 F (36.8 C) (Oral)   Resp (!) 23   Ht 5' 3.5" (1.613 m)   Wt 77.1 kg   SpO2 100%   BMI 29.64 kg/m   . General: Elderly black female. Awake and alert and oriented x3. No acute cardiopulmonary distress.  Marland Kitchen HEENT: Normocephalic atraumatic.  Right and left ears normal in appearance.  Pupils equal, round, reactive to light. Extraocular muscles are intact. Sclerae anicteric and noninjected.  Moist mucosal membranes. No mucosal lesions.  . Neck: Neck supple without lymphadenopathy. No  carotid bruits. No masses palpated.  . Cardiovascular: Regular rate with normal S1-S2 sounds. No murmurs, rubs, gallops auscultated.  Increased JVD.  Marland Kitchen Respiratory: Managed breath sounds throughout.  Rales in bases bilaterally.  No accessory muscle use. . Abdomen: Soft, suprapubic tenderness without rebound or guarding. Nondistended. Active bowel sounds. No masses or hepatosplenomegaly  . Skin: No rashes, lesions, or ulcerations.   Dry, warm to touch. 2+ dorsalis pedis and radial pulses. . Musculoskeletal: No calf or leg pain. All major joints not erythematous nontender.  No upper or lower joint deformation.  Good ROM.  No contractures  . Psychiatric: Intact judgment and insight. Pleasant and cooperative. . Neurologic: No focal neurological deficits. Strength is 5/5 and symmetric in upper and lower extremities.  Cranial nerves II through XII are grossly intact.           Labs on Admission: I have personally reviewed following labs and imaging studies  CBC: Recent Labs  Lab 08/01/18 1412  WBC 3.7*  NEUTROABS 2.3  HGB 12.8  HCT 44.8  MCV 90.5  PLT 469   Basic Metabolic Panel: Recent Labs  Lab 08/01/18 1412  NA 140  K 4.0  CL 102  CO2 30  GLUCOSE 92  BUN 8  CREATININE 0.59  CALCIUM 8.0*   GFR: Estimated Creatinine Clearance: 58.6 mL/min (by C-G formula based on SCr of 0.59 mg/dL). Liver Function Tests: Recent Labs  Lab 08/01/18 1412  AST 16  ALT 16  ALKPHOS 85  BILITOT 0.9  PROT 7.2  ALBUMIN 3.4*   Recent Labs  Lab 08/01/18 1412  LIPASE 26   No results for input(s): AMMONIA in the last 168 hours. Coagulation Profile: No results for input(s): INR, PROTIME in the last 168 hours. Cardiac Enzymes: Recent Labs  Lab 08/01/18 1412  TROPONINI 0.06*   BNP (last 3 results) No results for input(s): PROBNP in the last 8760 hours. HbA1C: No results for input(s): HGBA1C in the last 72 hours. CBG: No results for input(s): GLUCAP in the last 168 hours. Lipid Profile: No results for input(s): CHOL, HDL, LDLCALC, TRIG, CHOLHDL, LDLDIRECT in the last 72 hours. Thyroid Function Tests: No results for input(s): TSH, T4TOTAL, FREET4, T3FREE, THYROIDAB in the last 72 hours. Anemia Panel: No results for input(s): VITAMINB12, FOLATE, FERRITIN, TIBC, IRON, RETICCTPCT in the last 72 hours. Urine analysis:    Component Value Date/Time   COLORURINE YELLOW 08/01/2018 1343   APPEARANCEUR HAZY (A)  08/01/2018 1343   LABSPEC 1.011 08/01/2018 1343   PHURINE 5.0 08/01/2018 1343   GLUCOSEU NEGATIVE 08/01/2018 1343   HGBUR NEGATIVE 08/01/2018 1343   BILIRUBINUR NEGATIVE 08/01/2018 1343   KETONESUR NEGATIVE 08/01/2018 1343   PROTEINUR 30 (A) 08/01/2018 1343   NITRITE NEGATIVE 08/01/2018 1343   LEUKOCYTESUR NEGATIVE 08/01/2018 1343   Sepsis Labs: _0 (procalcitonin:4,lacticidven:4) ) Recent Results (from the past 240 hour(s))  SARS Coronavirus 2 (CEPHEID- Performed in [facility] hospital lab), Hosp Order     Status: None   Collection Time: 08/01/18  1:23 PM   Specimen: Nasopharyngeal Swab  Result Value Ref Range Status   SARS Coronavirus 2 NEGATIVE NEGATIVE Final    Comment: (NOTE) If result is NEGATIVE SARS-CoV-2 target nucleic acids are NOT DETECTED. The SARS-CoV-2 RNA is generally detectable in upper and lower  respiratory specimens during the acute phase of infection. The lowest  concentration of SARS-CoV-2 viral copies this assay can detect is 250  copies / mL. A negative result does not preclude SARS-CoV-2 infection  and should  not be used as the sole basis for treatment or other  patient management decisions.  A negative result may occur with  improper specimen collection / handling, submission of specimen other  than nasopharyngeal swab, presence of viral mutation(s) within the  areas targeted by this assay, and inadequate number of viral copies  (<250 copies / mL). A negative result must be combined with clinical  observations, patient history, and epidemiological information. If result is POSITIVE SARS-CoV-2 target nucleic acids are DETECTED. The SARS-CoV-2 RNA is generally detectable in upper and lower  respiratory specimens dur ing the acute phase of infection.  Positive  results are indicative of active infection with SARS-CoV-2.  Clinical  correlation with patient history and other diagnostic information is  necessary to determine patient infection status.   Positive results do  not rule out bacterial infection or co-infection with other viruses. If result is PRESUMPTIVE POSTIVE SARS-CoV-2 nucleic acids MAY BE PRESENT.   A presumptive positive result was obtained on the submitted specimen  and confirmed on repeat testing.  While 2019 novel coronavirus  (SARS-CoV-2) nucleic acids may be present in the submitted sample  additional confirmatory testing may be necessary for epidemiological  and / or clinical management purposes  to differentiate between  SARS-CoV-2 and other Sarbecovirus currently known to infect humans.  If clinically indicated additional testing with an alternate test  methodology [phone]) is advised. The SARS-CoV-2 RNA is generally  detectable in upper and lower respiratory sp ecimens during the acute  phase of infection. The expected result is Negative. Fact Sheet for Patients:  StrictlyIdeas.no Fact Sheet for Healthcare Providers: BankingDealers.co.za This test is not yet approved or cleared by the [place] FDA and has been authorized for detection and/or diagnosis of SARS-CoV-2 by FDA under an Emergency Use Authorization (EUA).  This EUA will remain in effect (meaning this test can be used) for the duration of the COVID-19 declaration under Section 564(b)(1) of the Act, 21 U.S.C. section 360bbb-3(b)(1), unless the authorization is terminated or revoked sooner. Performed at [facility], [address], [place], [place] [address]      Radiological Exams on Admission: Ct Abdomen Pelvis W Contrast  Result Date: 08/01/2018 CLINICAL DATA:  Abdominal pain and feet swelling 2-3 days.  Hypoxia. EXAM: CT ABDOMEN AND PELVIS WITH CONTRAST TECHNIQUE: Multidetector CT imaging of the abdomen and pelvis was performed using the standard protocol following bolus administration of intravenous contrast. CONTRAST:  126m OMNIPAQUE IOHEXOL 300 MG/ML  SOLN COMPARISON:  Chest CT 01/24/2018  FINDINGS: Lower chest: Moderate stable cardiomegaly. Calcified plaque over the right coronary artery. Mild interstitial changes and bronchiectasis over the lung bases which is stable. Calcified plaque over the descending thoracic aorta. Hepatobiliary: Mild low-attenuation of the liver without focal mass. Gallbladder and biliary tree are normal. Pancreas: Normal. Spleen: Normal. Adrenals/Urinary Tract: Adrenal glands are normal and symmetric. Kidneys are normal in size with several small cysts. No hydronephrosis or nephrolithiasis. Ureters and bladder are normal. Stomach/Bowel: Stomach and small bowel are normal. Appendix is normal. Colon is unremarkable. Vascular/Lymphatic: Mild-to-moderate calcified plaque over the abdominal aorta. No adenopathy. Reproductive: Normal. Other: Minimal free peritoneal fluid. No free peritoneal air. No focal inflammatory change. Mild subcutaneous edema over the abdomen/pelvis. Musculoskeletal: Degenerative change of the spine and hips. IMPRESSION: No acute findings in the abdomen/pelvis. Minimal nonspecific free peritoneal fluid. Mild stable cardiomegaly. Stable interstitial changes and bronchiectasis in the lung bases. Mild hepatic steatosis without focal mass. Bilateral renal cysts. Aortic Atherosclerosis (ICD10-I70.0). Electronically Signed  By: Marin Olp M.D.   On: 08/01/2018 17:22   Dg Chest Portable 1 View  Result Date: 08/01/2018 CLINICAL DATA:  Hypoxia, abdominal pain and feet swelling 2-3 days. EXAM: PORTABLE CHEST 1 VIEW COMPARISON:  01/24/2018 FINDINGS: Lungs are adequately inflated demonstrate hazy prominence of the perihilar vessels suggesting mild interstitial edema. No definite effusion. Mild stable cardiomegaly. Remainder the exam is unchanged. IMPRESSION: Mild stable cardiomegaly with evidence suggesting mild interstitial edema. Electronically Signed   By: Marin Olp M.D.   On: 08/01/2018 14:37    EKG: Independently reviewed.  Sinus rhythm with left  anterior fascicular block.  Old anterior MI.  No acute ST changes.  Assessment/Plan: Active Problems:   COPD (chronic obstructive pulmonary disease) ([facility])   Acute exacerbation of CHF (congestive heart failure) ([place])   Hypertension   Acute respiratory failure with hypoxia and hypercapnia ([place])   Acute lower UTI    This patient was discussed with the ED physician, including pertinent vitals, physical exam findings, labs, and imaging.  We also discussed care given by the ED provider.  1. Acute respiratory failure with hypoxia and hypercapnia a. Anticipate the patient needing 48 hours to 72 hours in order to control her symptoms, diuresis, and to be stable for discharge. 2. Acute exacerbation of CHF Telemetry monitoring Strict I/O Daily Weights Diuresis: Lasix 40 mg IV twice daily Potassium: 40 mEq twice a day by mouth Echo cardiac exam tomorrow Repeat BMP tomorrow 3. Hypertension a. Continue antihypertensives 4. COPD a. Appears controlled at the moment 5. UTI a. Rocephin b. Urine culture pending   DVT prophylaxis: Lovenox Consultants: None Code Status: Full code Family Communication: None Disposition Plan: Patient to return home following admission   Truett Mainland, DO

## 2018-08-01 NOTE — ED Notes (Addendum)
Date and time results received: 08/01/18 3:13 PM  (use smartphrase ".now" to insert current time)  Test: troponin Critical Value: 0.06  Name of Provider Notified: [email or web address], MD  Orders Received? Or Actions Taken?: na

## 2018-08-01 NOTE — ED Triage Notes (Addendum)
Pt c/o abdominal pain and feet swelling x 2-3 days. Sats 80-86% on RA. Denies SOB. States she does not wear oxygen.

## 2018-08-01 NOTE — Progress Notes (Signed)
Notified c bodenheimer of st elevation on telemetry monitoring. Previous ekg shows st elevation also

## 2018-08-01 NOTE — ED Provider Notes (Signed)
[facility] EMERGENCY DEPARTMENT Provider Note   CSN: [id number] Arrival date & time: 08/01/18  1218    History   Chief Complaint Chief Complaint  Patient presents with   Abdominal Pain    HPI Marisa Bailey is a 78 y.o. female with history of COPD, hypertension, obesity, CHF presents today for evaluation of acute onset, progressively worsening lower abdominal pain for 3 days.  Reports throbbing pain to this region, no aggravating or alleviating factors noted.  Denies nausea, vomiting, urinary symptoms.  She does note that she has been constipated, last had a small bowel movement this morning.  Denies melena or hematochezia.  No fevers or chills.  She reports that she has had a nonproductive cough for the last 2 weeks or so and states that she does feel short of breath though is unsure if this is worse than baseline.  She was found to be hypoxic with SPO2 saturations 80 to 86% on room air on arrival.  Denies orthopnea.  She does have bilateral lower extremity edema but states that she is unsure if this is worse than usual and that her weight fluctuates when she checks it.  She was previously prescribed Lasix but states that she has not taken this medication ever since it ran out sometime ago.  She is a current smoker of a half a pack of cigarettes daily.     The history is provided by the patient.    Past Medical History:  Diagnosis Date   Bronchitis    COPD (chronic obstructive pulmonary disease) ([place])    Hypertension     Patient Active Problem List   Diagnosis Date Noted   Acute respiratory failure with hypoxia and hypercapnia ([place]) 08/01/2018   Acute lower UTI 08/01/2018   Hypoxia    Leg swelling    Obesity (BMI 30-39.9) 01/24/2018   Acute respiratory failure with hypoxia ([place]) 06/27/2017   COPD (chronic obstructive pulmonary disease) ([place]) 06/27/2017   Acute exacerbation of CHF (congestive heart failure) ([place]) 06/27/2017   Tobacco abuse 06/27/2017    Hypertension    Muscle weakness (generalized) 08/17/2012   Pain in joint, shoulder region 08/17/2012   Fx upper humerus-closed 08/17/2012    Past Surgical History:  Procedure Laterality Date   ENUCLEATION       OB History   No obstetric history on file.      Home Medications    Prior to Admission medications   Medication Sig Start Date End Date Taking? Authorizing Provider  amLODipine (NORVASC) 10 MG tablet Take 10 mg by mouth daily.   Yes [provider]  aspirin EC 81 MG tablet Take 81 mg by mouth at bedtime.    Yes [provider]  folic acid (FOLVITE) 1 MG tablet Take 1 mg by mouth daily.  06/11/17  Yes [provider]  furosemide (LASIX) 20 MG tablet Take 1 tablet (20 mg total) by mouth daily. 01/28/18  Yes Shah, Pratik D, DO  olmesartan (BENICAR) 40 MG tablet Take 40 mg by mouth daily.   Yes [provider]  PROAIR HFA 108 (90 Base) MCG/ACT inhaler Inhale 1-2 puffs into the lungs every 4 (four) hours as needed for wheezing.  05/28/17  Yes [provider]    Family History No family history on file.  Social History Social History   Tobacco Use   Smoking status: Current Every Day Smoker    Packs/day: 0.50    Types: Cigarettes   Smokeless tobacco: Never Used  Substance Use Topics   Alcohol use: No    Comment: rarely   Drug use: No     Allergies   Penicillins   Review of Systems Review of Systems  Constitutional: Negative for chills and fever.  Respiratory: Positive for cough and shortness of breath.   Cardiovascular: Positive for leg swelling. Negative for chest pain.  Gastrointestinal: Positive for abdominal pain and constipation. Negative for blood in stool, nausea and vomiting.  Genitourinary: Negative for dysuria, frequency, urgency, vaginal bleeding, vaginal discharge and vaginal pain.  All other systems reviewed and are negative.    Physical Exam Updated Vital Signs BP (!) 152/120 (BP Location:  Right Arm)    Pulse 62    Temp 97.7 F (36.5 C) (Oral)    Resp 18    Ht _0  (1.6 m)    Wt 82.5 kg    SpO2 99%    BMI 32.22 kg/m   Physical Exam Vitals signs and nursing note reviewed.  Constitutional:      General: She is not in acute distress.    Appearance: She is well-developed.  HENT:     Head: Normocephalic and atraumatic.  Eyes:     General:        Right eye: No discharge.        Left eye: No discharge.     Conjunctiva/sclera: Conjunctivae normal.  Neck:     Vascular: No JVD.     Trachea: No tracheal deviation.  Cardiovascular:     Rate and Rhythm: Normal rate and regular rhythm.     Comments: 2+ pitting edema of the bilateral lower extremities.  2+ radial and DP/PT pulses bilaterally Pulmonary:     Comments: SPO2 saturations 85% on room air, improved to 95% on 2 L nasal cannula.  Intermittently tachypneic.  Scattered expiratory wheezes and bibasilar crackles noted Abdominal:     General: Abdomen is protuberant. Bowel sounds are normal. There is no distension.     Palpations: Abdomen is soft.     Tenderness: There is abdominal tenderness in the right lower quadrant, suprapubic area and left lower quadrant. There is no right CVA tenderness, left CVA tenderness, guarding or rebound. Negative signs include Murphy's sign and McBurney's sign.  Skin:    General: Skin is warm and dry.     Findings: No erythema.  Neurological:     Mental Status: She is alert.  Psychiatric:        Behavior: Behavior normal.      ED Treatments / Results  Labs (all labs ordered are listed, but only abnormal results are displayed) Labs Reviewed  CBC WITH DIFFERENTIAL/PLATELET - Abnormal; Notable for the following components:      Result Value   WBC 3.7 (*)    MCH 25.9 (*)    MCHC 28.6 (*)    RDW 16.9 (*)    All other components within normal limits  COMPREHENSIVE METABOLIC PANEL - Abnormal; Notable for the following components:   Calcium 8.0 (*)    Albumin 3.4 (*)    All other  components within normal limits  TROPONIN I - Abnormal; Notable for the following components:   Troponin I 0.06 (*)    All other components within normal limits  BRAIN NATRIURETIC PEPTIDE - Abnormal; Notable for the following components:   B Natriuretic Peptide 389.0 (*)    All other components within normal limits  URINALYSIS, ROUTINE W REFLEX MICROSCOPIC - Abnormal; Notable for the following components:   APPearance HAZY (*)  Protein, ur 30 (*)    Bacteria, UA MANY (*)    All other components within normal limits  SARS CORONAVIRUS 2 (HOSPITAL ORDER, [place] [facility])  URINE CULTURE  LIPASE, BLOOD  BASIC METABOLIC PANEL    EKG EKG Interpretation  Date/Time:  Saturday August 01 2018 12:34:34 EDT Ventricular Rate:  74 PR Interval:    QRS Duration: 87 QT Interval:  385 QTC Calculation: 419 R Axis:   -118 Text Interpretation:  Sinus rhythm Atrial premature complex Left anterior fascicular block Anterior infarct, old Minimal ST elevation, lateral leads Confirmed by Fredia Sorrow [phone]) on 08/01/2018 3:26:56 PM   Radiology Ct Abdomen Pelvis W Contrast  Result Date: 08/01/2018 CLINICAL DATA:  Abdominal pain and feet swelling 2-3 days.  Hypoxia. EXAM: CT ABDOMEN AND PELVIS WITH CONTRAST TECHNIQUE: Multidetector CT imaging of the abdomen and pelvis was performed using the standard protocol following bolus administration of intravenous contrast. CONTRAST:  166m OMNIPAQUE IOHEXOL 300 MG/ML  SOLN COMPARISON:  Chest CT 01/24/2018 FINDINGS: Lower chest: Moderate stable cardiomegaly. Calcified plaque over the right coronary artery. Mild interstitial changes and bronchiectasis over the lung bases which is stable. Calcified plaque over the descending thoracic aorta. Hepatobiliary: Mild low-attenuation of the liver without focal mass. Gallbladder and biliary tree are normal. Pancreas: Normal. Spleen: Normal. Adrenals/Urinary Tract: Adrenal glands are normal and symmetric.  Kidneys are normal in size with several small cysts. No hydronephrosis or nephrolithiasis. Ureters and bladder are normal. Stomach/Bowel: Stomach and small bowel are normal. Appendix is normal. Colon is unremarkable. Vascular/Lymphatic: Mild-to-moderate calcified plaque over the abdominal aorta. No adenopathy. Reproductive: Normal. Other: Minimal free peritoneal fluid. No free peritoneal air. No focal inflammatory change. Mild subcutaneous edema over the abdomen/pelvis. Musculoskeletal: Degenerative change of the spine and hips. IMPRESSION: No acute findings in the abdomen/pelvis. Minimal nonspecific free peritoneal fluid. Mild stable cardiomegaly. Stable interstitial changes and bronchiectasis in the lung bases. Mild hepatic steatosis without focal mass. Bilateral renal cysts. Aortic Atherosclerosis (ICD10-I70.0). Electronically Signed   By: DMarin OlpM.D.   On: 08/01/2018 17:22   Dg Chest Portable 1 View  Result Date: 08/01/2018 CLINICAL DATA:  Hypoxia, abdominal pain and feet swelling 2-3 days. EXAM: PORTABLE CHEST 1 VIEW COMPARISON:  01/24/2018 FINDINGS: Lungs are adequately inflated demonstrate hazy prominence of the perihilar vessels suggesting mild interstitial edema. No definite effusion. Mild stable cardiomegaly. Remainder the exam is unchanged. IMPRESSION: Mild stable cardiomegaly with evidence suggesting mild interstitial edema. Electronically Signed   By: DMarin OlpM.D.   On: 08/01/2018 14:37    Procedures .Critical Care Performed by: FRenita Papa PA-C Authorized by: FRenita Papa PA-C   Critical care provider statement:    Critical care time (minutes):  40   Critical care was necessary to treat or prevent imminent or life-threatening deterioration of the following conditions:  Respiratory failure   Critical care was time spent personally by me on the following activities:  Discussions with consultants, evaluation of patient's response to treatment, examination of patient,  ordering and performing treatments and interventions, ordering and review of laboratory studies, ordering and review of radiographic studies, pulse oximetry, re-evaluation of patient's condition, obtaining history from patient or surrogate and review of old charts   (including critical care time)  Medications Ordered in ED Medications  cefTRIAXone (ROCEPHIN) 1 g in sodium chloride 0.9 % 100 mL IVPB (0 g Intravenous Stopped 08/01/18 1843)  amLODipine (NORVASC) tablet 10 mg (10 mg Oral Given 08/01/18 2042)  aspirin EC  tablet 81 mg (81 mg Oral Given 08/01/18 2042)  irbesartan (AVAPRO) tablet 300 mg (300 mg Oral Given 08/01/18 2041)  albuterol (PROVENTIL) (2.5 MG/3ML) 0.083% nebulizer solution 3 mL (has no administration in time range)  sodium chloride flush (NS) 0.9 % injection 3 mL (3 mLs Intravenous Given 08/01/18 2042)  sodium chloride flush (NS) 0.9 % injection 3 mL (has no administration in time range)  0.9 %  sodium chloride infusion (has no administration in time range)  acetaminophen (TYLENOL) tablet 650 mg (has no administration in time range)  ondansetron (ZOFRAN) injection 4 mg (has no administration in time range)  enoxaparin (LOVENOX) injection 40 mg (40 mg Subcutaneous Given 08/01/18 2041)  furosemide (LASIX) injection 40 mg (has no administration in time range)  potassium chloride SA (K-DUR) CR tablet 40 mEq (40 mEq Oral Given 08/01/18 2041)  iohexol (OMNIPAQUE) 300 MG/ML solution 100 mL (100 mLs Intravenous Contrast Given 08/01/18 1656)  furosemide (LASIX) injection 40 mg (40 mg Intravenous Given 08/01/18 1803)     Initial Impression / Assessment and Plan / ED Course  I have reviewed the triage vital signs and the nursing notes.  Pertinent labs & imaging results that were available during my care of the patient were reviewed by me and considered in my medical decision making (see chart for details).        Patient presenting for evaluation of lower abdominal pain as well as  progressively worsening shortness of breath, bilateral lower extremity edema.  She is afebrile, hypoxic on room air requiring supplemental oxygen to maintain O2 saturations even at rest.  Abdomen is soft with no peritoneal signs.  She tells me that she stopped taking her Lasix whenever she ran out sometime ago.  Work-up today is suggestive of CHF exacerbation with cardiomegaly and interstitial edema, elevated BNP, and troponin elevated at patient's baseline likely due to demand ischemia.  EKG shows normal sinus rhythm, no acute ischemic abnormalities.  Remainder of blood work reviewed by myself shows no leukocytosis, no anemia, no metabolic derangements, no renal insufficiency.  Her UA is suggestive of UTI, will culture.  She tells me that she has tolerated Keflex in the past so we will give a dose of Rocephin in the ED and monitor for any allergic reaction symptoms.  Also given IV Lasix in the ED for her CHF.  Given her hypoxia and volume overload status, I feel she would benefit from admission to the hospital.  Spoke with Dr. Nehemiah Settle with [facility] who agrees to assume care of patient and bring her into the hospital for further evaluation and management.  Final Clinical Impressions(s) / ED Diagnoses   Final diagnoses:  Acute respiratory failure with hypoxia (HCC)  Acute on chronic congestive heart failure, unspecified heart failure type [facility])  Acute cystitis without hematuria    ED Discharge Orders    None       Debroah Baller 08/01/18 2150    Milton Ferguson, MD 08/03/18 1507

## 2018-08-02 ENCOUNTER — Inpatient Hospital Stay (HOSPITAL_COMMUNITY): Payer: Medicare Other

## 2018-08-02 DIAGNOSIS — I340 Nonrheumatic mitral (valve) insufficiency: Secondary | ICD-10-CM

## 2018-08-02 DIAGNOSIS — I361 Nonrheumatic tricuspid (valve) insufficiency: Secondary | ICD-10-CM

## 2018-08-02 LAB — ECHOCARDIOGRAM COMPLETE
Height: 63 in
Weight: 2927.71 oz

## 2018-08-02 LAB — BASIC METABOLIC PANEL
Anion gap: 8 (ref 5–15)
BUN: 8 mg/dL (ref 8–23)
CO2: 31 mmol/L (ref 22–32)
Calcium: 7.8 mg/dL — ABNORMAL LOW (ref 8.9–10.3)
Chloride: 96 mmol/L — ABNORMAL LOW (ref 98–111)
Creatinine, Ser: 0.67 mg/dL (ref 0.44–1.00)
GFR calc Af Amer: >60 mL/min (ref >60)
GFR calc non Af Amer: >60 mL/min (ref >60)
Glucose, Bld: 82 mg/dL (ref 70–99)
Potassium: 4.2 mmol/L (ref 3.5–5.1)
Sodium: 135 mmol/L (ref 135–145)

## 2018-08-02 LAB — TROPONIN I
Troponin I: 0.06 ng/mL (ref <0.03)
Troponin I: 0.05 ng/mL (ref <0.03)
Troponin I: 0.05 ng/mL (ref <0.03)

## 2018-08-02 NOTE — Progress Notes (Signed)
PROGRESS NOTE    Marisa Bailey  GDJ:[id number] DOB: 09-01-1940 DOA: 08/01/2018 PCP: Lucia Gaskins, MD   Brief Narrative:  Per HPI: Marisa Bailey is a 78 y.o. female with a history of COPD, Hypertension. Here for shortness of breath that has been worsening over the past 2 to 3 days.  Breathing is worse with ambulation and exertion and improved with rest.  No other palliating or provoking factors.  Denies cough, wheezing, fevers, chills.  Admits to orthopnea symptoms are worsening.  Additionally, she has had some increased lower suprapubic pain without dysuria, but frequency.  Patient was admitted with acute hypercapnic and hypoxemic respiratory failure secondary to acute CHF exacerbation.  She has been placed on Lasix IV twice daily with 2D echocardiogram currently pending.  She is started feel somewhat better.  UTI noted and patient empirically started on Rocephin with cultures pending.  Previous 2D echocardiogram on 06/2017 with LVEF 50 to 55% and grade 1 diastolic dysfunction.  Assessment & Plan:   Principal Problem:   Acute respiratory failure with hypoxia and hypercapnia (HCC) Active Problems:   COPD (chronic obstructive pulmonary disease) (HCC)   Acute exacerbation of CHF (congestive heart failure) (HCC)   Hypertension   Acute lower UTI   Acute hypoxemic respiratory failure secondary to diastolic CHF exacerbation -Continue on Lasix 40 mg IV twice daily as prescribed; appears to be noncompliant with home Lasix which is a likely cause -Strict I's and O's and daily weights -Repeat 2D echocardiogram pending with prior in 06/2017 with LVEF 50 to 55% and grade 1 diastolic dysfunction. -Wean oxygen as tolerated  Suspect UTI -Urine with bacteriuria noted and patient empirically on Rocephin -Await urine cultures  Hypertension-controlled -Continue current antihypertensives  COPD -Duo nebs as needed with no acute bronchospasms currently noted    DVT prophylaxis:  Lovenox Code Status: Full Family Communication: None at bedside Disposition Plan: Continue Rocephin for UTI as well as IV Lasix for diuresis.  2D echocardiogram pending.  Anticipate discharge in a.m. if improved.   Consultants:   None  Procedures:   None  Antimicrobials:   Rocephin 6/20->   Subjective: Patient seen and evaluated today with no new acute complaints or concerns. No acute concerns or events noted overnight.  She is currently on nasal cannula oxygen and states that she started breathing better.  Objective: Vitals:   08/01/18 1942 08/01/18 2000 08/02/18 0529 08/02/18 0836  BP: (!) 152/120  (!) 133/47 (!) 132/56  Pulse: 62  64   Resp: 18  (!) 22   Temp: 97.7 F (36.5 C)  98.4 F (36.9 C)   TempSrc: Oral  Oral   SpO2: 99%  95%   Weight:  82.5 kg 83 kg   Height:  _0  (1.6 m)      Intake/Output Summary (Last 24 hours) at 08/02/2018 1115 Last data filed at 08/02/2018 0300 Gross per 24 hour  Intake 100 ml  Output 900 ml  Net -800 ml   Filed Weights   08/01/18 1223 08/01/18 2000 08/02/18 0529  Weight: 77.1 kg 82.5 kg 83 kg    Examination:  General exam: Appears calm and comfortable  Respiratory system: Clear to auscultation. Respiratory effort normal. Cardiovascular system: S1 & S2 heard, RRR. No JVD, murmurs, rubs, gallops or clicks. No pedal edema. Gastrointestinal system: Abdomen is nondistended, soft and nontender. No organomegaly or masses felt. Normal bowel sounds heard. Central nervous system: Alert and oriented. No focal neurological deficits. Extremities: Symmetric 5 x 5 power. Skin:  No rashes, lesions or ulcers Psychiatry: Judgement and insight appear normal. Mood & affect appropriate.     Data Reviewed: I have personally reviewed following labs and imaging studies  CBC: Recent Labs  Lab 08/01/18 1412  WBC 3.7*  NEUTROABS 2.3  HGB 12.8  HCT 44.8  MCV 90.5  PLT 161   Basic Metabolic Panel: Recent Labs  Lab 08/01/18 1412  08/02/18 0606  NA 140 135  K 4.0 4.2  CL 102 96*  CO2 30 31  GLUCOSE 92 82  BUN 8 8  CREATININE 0.59 0.67  CALCIUM 8.0* 7.8*   GFR: Estimated Creatinine Clearance: 60.1 mL/min (by C-G formula based on SCr of 0.67 mg/dL). Liver Function Tests: Recent Labs  Lab 08/01/18 1412  AST 16  ALT 16  ALKPHOS 85  BILITOT 0.9  PROT 7.2  ALBUMIN 3.4*   Recent Labs  Lab 08/01/18 1412  LIPASE 26   No results for input(s): AMMONIA in the last 168 hours. Coagulation Profile: No results for input(s): INR, PROTIME in the last 168 hours. Cardiac Enzymes: Recent Labs  Lab 08/01/18 1412  TROPONINI 0.06*   BNP (last 3 results) No results for input(s): PROBNP in the last 8760 hours. HbA1C: No results for input(s): HGBA1C in the last 72 hours. CBG: No results for input(s): GLUCAP in the last 168 hours. Lipid Profile: No results for input(s): CHOL, HDL, LDLCALC, TRIG, CHOLHDL, LDLDIRECT in the last 72 hours. Thyroid Function Tests: No results for input(s): TSH, T4TOTAL, FREET4, T3FREE, THYROIDAB in the last 72 hours. Anemia Panel: No results for input(s): VITAMINB12, FOLATE, FERRITIN, TIBC, IRON, RETICCTPCT in the last 72 hours. Sepsis Labs: No results for input(s): PROCALCITON, LATICACIDVEN in the last 168 hours.  Recent Results (from the past 240 hour(s))  SARS Coronavirus 2 (CEPHEID- Performed in [place] hospital lab), Hosp Order     Status: None   Collection Time: 08/01/18  1:23 PM   Specimen: Nasopharyngeal Swab  Result Value Ref Range Status   SARS Coronavirus 2 NEGATIVE NEGATIVE Final    Comment: (NOTE) If result is NEGATIVE SARS-CoV-2 target nucleic acids are NOT DETECTED. The SARS-CoV-2 RNA is generally detectable in upper and lower  respiratory specimens during the acute phase of infection. The lowest  concentration of SARS-CoV-2 viral copies this assay can detect is 250  copies / mL. A negative result does not preclude SARS-CoV-2 infection  and should not be used  as the sole basis for treatment or other  patient management decisions.  A negative result may occur with  improper specimen collection / handling, submission of specimen other  than nasopharyngeal swab, presence of viral mutation(s) within the  areas targeted by this assay, and inadequate number of viral copies  (<250 copies / mL). A negative result must be combined with clinical  observations, patient history, and epidemiological information. If result is POSITIVE SARS-CoV-2 target nucleic acids are DETECTED. The SARS-CoV-2 RNA is generally detectable in upper and lower  respiratory specimens dur ing the acute phase of infection.  Positive  results are indicative of active infection with SARS-CoV-2.  Clinical  correlation with patient history and other diagnostic information is  necessary to determine patient infection status.  Positive results do  not rule out bacterial infection or co-infection with other viruses. If result is PRESUMPTIVE POSTIVE SARS-CoV-2 nucleic acids MAY BE PRESENT.   A presumptive positive result was obtained on the submitted specimen  and confirmed on repeat testing.  While 2019 novel coronavirus  (SARS-CoV-2) nucleic  acids may be present in the submitted sample  additional confirmatory testing may be necessary for epidemiological  and / or clinical management purposes  to differentiate between  SARS-CoV-2 and other Sarbecovirus currently known to infect humans.  If clinically indicated additional testing with an alternate test  methodology [phone]) is advised. The SARS-CoV-2 RNA is generally  detectable in upper and lower respiratory sp ecimens during the acute  phase of infection. The expected result is Negative. Fact Sheet for Patients:  StrictlyIdeas.no Fact Sheet for Healthcare Providers: BankingDealers.co.za This test is not yet approved or cleared by the [place] FDA and has been authorized for  detection and/or diagnosis of SARS-CoV-2 by FDA under an Emergency Use Authorization (EUA).  This EUA will remain in effect (meaning this test can be used) for the duration of the COVID-19 declaration under Section 564(b)(1) of the Act, 21 U.S.C. section 360bbb-3(b)(1), unless the authorization is terminated or revoked sooner. Performed at [facility], [address], [place], [place] [address]          Radiology Studies: Ct Abdomen Pelvis W Contrast  Result Date: 08/01/2018 CLINICAL DATA:  Abdominal pain and feet swelling 2-3 days.  Hypoxia. EXAM: CT ABDOMEN AND PELVIS WITH CONTRAST TECHNIQUE: Multidetector CT imaging of the abdomen and pelvis was performed using the standard protocol following bolus administration of intravenous contrast. CONTRAST:  147m OMNIPAQUE IOHEXOL 300 MG/ML  SOLN COMPARISON:  Chest CT 01/24/2018 FINDINGS: Lower chest: Moderate stable cardiomegaly. Calcified plaque over the right coronary artery. Mild interstitial changes and bronchiectasis over the lung bases which is stable. Calcified plaque over the descending thoracic aorta. Hepatobiliary: Mild low-attenuation of the liver without focal mass. Gallbladder and biliary tree are normal. Pancreas: Normal. Spleen: Normal. Adrenals/Urinary Tract: Adrenal glands are normal and symmetric. Kidneys are normal in size with several small cysts. No hydronephrosis or nephrolithiasis. Ureters and bladder are normal. Stomach/Bowel: Stomach and small bowel are normal. Appendix is normal. Colon is unremarkable. Vascular/Lymphatic: Mild-to-moderate calcified plaque over the abdominal aorta. No adenopathy. Reproductive: Normal. Other: Minimal free peritoneal fluid. No free peritoneal air. No focal inflammatory change. Mild subcutaneous edema over the abdomen/pelvis. Musculoskeletal: Degenerative change of the spine and hips. IMPRESSION: No acute findings in the abdomen/pelvis. Minimal nonspecific free peritoneal fluid. Mild stable  cardiomegaly. Stable interstitial changes and bronchiectasis in the lung bases. Mild hepatic steatosis without focal mass. Bilateral renal cysts. Aortic Atherosclerosis (ICD10-I70.0). Electronically Signed   By: DMarin OlpM.D.   On: 08/01/2018 17:22   Dg Chest Portable 1 View  Result Date: 08/01/2018 CLINICAL DATA:  Hypoxia, abdominal pain and feet swelling 2-3 days. EXAM: PORTABLE CHEST 1 VIEW COMPARISON:  01/24/2018 FINDINGS: Lungs are adequately inflated demonstrate hazy prominence of the perihilar vessels suggesting mild interstitial edema. No definite effusion. Mild stable cardiomegaly. Remainder the exam is unchanged. IMPRESSION: Mild stable cardiomegaly with evidence suggesting mild interstitial edema. Electronically Signed   By: DMarin OlpM.D.   On: 08/01/2018 14:37        Scheduled Meds:  amLODipine  10 mg Oral Daily   aspirin EC  81 mg Oral QHS   enoxaparin (LOVENOX) injection  40 mg Subcutaneous Q24H   furosemide  40 mg Intravenous BID   irbesartan  300 mg Oral Daily   potassium chloride  40 mEq Oral BID   sodium chloride flush  3 mL Intravenous Q12H   Continuous Infusions:  sodium chloride     cefTRIAXone (ROCEPHIN)  IV Stopped (08/01/18 1843)     LOS:  1 day    Time spent: 30 minutes    Cataleah Stites Darleen Crocker, DO [facility] Pager [phone]  If 7PM-7AM, please contact night-coverage [email or web address] Password [facility] 08/02/2018, 11:15 AM

## 2018-08-02 NOTE — Progress Notes (Signed)
*  PRELIMINARY RESULTS* Echocardiogram 2D Echocardiogram has been performed.  Leavy Cella 08/02/2018, 2:25 PM

## 2018-08-03 LAB — URINE CULTURE: Culture: NO GROWTH

## 2018-08-03 LAB — BASIC METABOLIC PANEL
Anion gap: 9 (ref 5–15)
BUN: 9 mg/dL (ref 8–23)
CO2: 36 mmol/L — ABNORMAL HIGH (ref 22–32)
Calcium: 8.5 mg/dL — ABNORMAL LOW (ref 8.9–10.3)
Chloride: 96 mmol/L — ABNORMAL LOW (ref 98–111)
Creatinine, Ser: 0.71 mg/dL (ref 0.44–1.00)
GFR calc Af Amer: >60 mL/min (ref >60)
GFR calc non Af Amer: >60 mL/min (ref >60)
Glucose, Bld: 87 mg/dL (ref 70–99)
Potassium: 4.8 mmol/L (ref 3.5–5.1)
Sodium: 141 mmol/L (ref 135–145)

## 2018-08-03 NOTE — Progress Notes (Addendum)
PROGRESS NOTE    Marisa Bailey  JQZ:[id number] DOB: 31-Aug-1940 DOA: 08/01/2018 PCP: Lucia Gaskins, MD   Brief Narrative:  Per HPI: [facility] Cummingsis a 78 y.o.femalewith a history of COPD, Hypertension. Herefor shortness of breath that has been worsening over the past 2 to 3 days. Breathing is worse with ambulation and exertion and improved with rest. No other palliating or provoking factors. Denies cough, wheezing, fevers, chills. Admits to orthopnea symptoms are worsening.  Additionally, she has had some increased lower suprapubic pain without dysuria, but frequency.  Patient was admitted with acute hypercapnic and hypoxemic respiratory failure secondary to acute CHF exacerbation.  She has been placed on Lasix IV twice daily with 2D echocardiogram currently pending.  She is started feel somewhat better.  UTI noted and patient empirically started on Rocephin with cultures pending.  Previous 2D echocardiogram on 06/2017 with LVEF 50 to 55% and grade 1 diastolic dysfunction.  Assessment & Plan:   Principal Problem:   Acute respiratory failure with hypoxia and hypercapnia (HCC) Active Problems:   COPD (chronic obstructive pulmonary disease) (HCC)   Acute exacerbation of CHF (congestive heart failure) (HCC)   Hypertension   Acute lower UTI  Acute hypoxemic respiratory failure secondary to diastolic CHF exacerbation -Continue on Lasix 40 mg IV twice daily as prescribed; appears to be noncompliant with home Lasix which is a likely cause -Strict I's and O's and daily weights with good diuresis noted -Repeat 2D echocardiogram pending with prior in 06/2017 with LVEF 50 to 55% and grade 1 diastolic dysfunction. -Wean oxygen as tolerated, still on 2 L  Suspect UTI -Urine with bacteriuria noted and patient empirically on Rocephin -Urine cultures with no growth.  Will finish a course of Rocephin by 6/23  Hypertension-controlled -Continue current antihypertensives  COPD -Duo  nebs as needed with no acute bronchospasms currently noted    DVT prophylaxis: Lovenox Code Status: Full Family Communication: None at bedside Disposition Plan: Continue Rocephin for UTI as well as IV Lasix for diuresis.    2D echocardiogram with LVEF 50 to 55% with no other acute findings.  Anticipate discharge in a.m. if stable.  PT evaluation today.  Continues to require IV diuresis and oxygen support.  Consultants:   None  Procedures:   None  Antimicrobials:   Rocephin 6/20->  Subjective: Patient seen and evaluated today with no new acute complaints or concerns. No acute concerns or events noted overnight.  She continues to have some shortness of breath and is not quite at baseline with some edema to her lower extremities.  Objective: Vitals:   08/02/18 2124 08/03/18 0400 08/03/18 0520 08/03/18 0845  BP: 137/61  (!) 144/60 (!) 148/73  Pulse: 64  63   Resp: 18  16   Temp: 98.3 F (36.8 C)  98.6 F (37 C)   TempSrc: Oral  Oral   SpO2: 92%  93%   Weight:  81.9 kg    Height:        Intake/Output Summary (Last 24 hours) at 08/03/2018 1151 Last data filed at 08/03/2018 1100 Gross per 24 hour  Intake 580 ml  Output 2900 ml  Net -2320 ml   Filed Weights   08/01/18 2000 08/02/18 0529 08/03/18 0400  Weight: 82.5 kg 83 kg 81.9 kg    Examination:  General exam: Appears calm and comfortable  Respiratory system: Clear to auscultation. Respiratory effort normal.  Currently on 2 L nasal cannula Cardiovascular system: S1 & S2 heard, RRR. No JVD, murmurs, rubs,  gallops or clicks.  Bilateral ankle edema noted. Gastrointestinal system: Abdomen is nondistended, soft and nontender. No organomegaly or masses felt. Normal bowel sounds heard. Central nervous system: Alert and oriented. No focal neurological deficits. Extremities: Symmetric 5 x 5 power. Skin: No rashes, lesions or ulcers Psychiatry: Judgement and insight appear normal. Mood & affect appropriate.      Data Reviewed: I have personally reviewed following labs and imaging studies  CBC: Recent Labs  Lab 08/01/18 1412  WBC 3.7*  NEUTROABS 2.3  HGB 12.8  HCT 44.8  MCV 90.5  PLT 557   Basic Metabolic Panel: Recent Labs  Lab 08/01/18 1412 08/02/18 0606 08/03/18 0447  NA 140 135 141  K 4.0 4.2 4.8  CL 102 96* 96*  CO2 30 31 36*  GLUCOSE 92 82 87  BUN _0 CREATININE 0.59 0.67 0.71  CALCIUM 8.0* 7.8* 8.5*   GFR: Estimated Creatinine Clearance: 59.7 mL/min (by C-G formula based on SCr of 0.71 mg/dL). Liver Function Tests: Recent Labs  Lab 08/01/18 1412  AST 16  ALT 16  ALKPHOS 85  BILITOT 0.9  PROT 7.2  ALBUMIN 3.4*   Recent Labs  Lab 08/01/18 1412  LIPASE 26   No results for input(s): AMMONIA in the last 168 hours. Coagulation Profile: No results for input(s): INR, PROTIME in the last 168 hours. Cardiac Enzymes: Recent Labs  Lab 08/01/18 1412 08/02/18 0606 08/02/18 1343 08/02/18 1952  TROPONINI 0.06* 0.06* 0.05* 0.05*   BNP (last 3 results) No results for input(s): PROBNP in the last 8760 hours. HbA1C: No results for input(s): HGBA1C in the last 72 hours. CBG: No results for input(s): GLUCAP in the last 168 hours. Lipid Profile: No results for input(s): CHOL, HDL, LDLCALC, TRIG, CHOLHDL, LDLDIRECT in the last 72 hours. Thyroid Function Tests: No results for input(s): TSH, T4TOTAL, FREET4, T3FREE, THYROIDAB in the last 72 hours. Anemia Panel: No results for input(s): VITAMINB12, FOLATE, FERRITIN, TIBC, IRON, RETICCTPCT in the last 72 hours. Sepsis Labs: No results for input(s): PROCALCITON, LATICACIDVEN in the last 168 hours.  Recent Results (from the past 240 hour(s))  SARS Coronavirus 2 (CEPHEID- Performed in [place] hospital lab), Hosp Order     Status: None   Collection Time: 08/01/18  1:23 PM   Specimen: Nasopharyngeal Swab  Result Value Ref Range Status   SARS Coronavirus 2 NEGATIVE NEGATIVE Final    Comment: (NOTE) If result is  NEGATIVE SARS-CoV-2 target nucleic acids are NOT DETECTED. The SARS-CoV-2 RNA is generally detectable in upper and lower  respiratory specimens during the acute phase of infection. The lowest  concentration of SARS-CoV-2 viral copies this assay can detect is 250  copies / mL. A negative result does not preclude SARS-CoV-2 infection  and should not be used as the sole basis for treatment or other  patient management decisions.  A negative result may occur with  improper specimen collection / handling, submission of specimen other  than nasopharyngeal swab, presence of viral mutation(s) within the  areas targeted by this assay, and inadequate number of viral copies  (<250 copies / mL). A negative result must be combined with clinical  observations, patient history, and epidemiological information. If result is POSITIVE SARS-CoV-2 target nucleic acids are DETECTED. The SARS-CoV-2 RNA is generally detectable in upper and lower  respiratory specimens dur ing the acute phase of infection.  Positive  results are indicative of active infection with SARS-CoV-2.  Clinical  correlation with patient history and other diagnostic  information is  necessary to determine patient infection status.  Positive results do  not rule out bacterial infection or co-infection with other viruses. If result is PRESUMPTIVE POSTIVE SARS-CoV-2 nucleic acids MAY BE PRESENT.   A presumptive positive result was obtained on the submitted specimen  and confirmed on repeat testing.  While 2019 novel coronavirus  (SARS-CoV-2) nucleic acids may be present in the submitted sample  additional confirmatory testing may be necessary for epidemiological  and / or clinical management purposes  to differentiate between  SARS-CoV-2 and other Sarbecovirus currently known to infect humans.  If clinically indicated additional testing with an alternate test  methodology [phone]) is advised. The SARS-CoV-2 RNA is generally  detectable  in upper and lower respiratory sp ecimens during the acute  phase of infection. The expected result is Negative. Fact Sheet for Patients:  StrictlyIdeas.no Fact Sheet for Healthcare Providers: BankingDealers.co.za This test is not yet approved or cleared by the [place] FDA and has been authorized for detection and/or diagnosis of SARS-CoV-2 by FDA under an Emergency Use Authorization (EUA).  This EUA will remain in effect (meaning this test can be used) for the duration of the COVID-19 declaration under Section 564(b)(1) of the Act, 21 U.S.C. section 360bbb-3(b)(1), unless the authorization is terminated or revoked sooner. Performed at [facility], [address], [place], [place] [address]   Urine culture     Status: None   Collection Time: 08/01/18  1:43 PM   Specimen: Urine, Clean Catch  Result Value Ref Range Status   Specimen Description   Final    URINE, CLEAN CATCH Performed at [facility], [address] [place], [place] [address]    Special Requests   Final    NONE Performed at [facility], [address] [place], [place] [address]    Culture   Final    NO GROWTH Performed at [facility], [place] [address], [place], [place] [address]    Report Status 08/03/2018 FINAL  Final         Radiology Studies: Ct Abdomen Pelvis W Contrast  Result Date: 08/01/2018 CLINICAL DATA:  Abdominal pain and feet swelling 2-3 days.  Hypoxia. EXAM: CT ABDOMEN AND PELVIS WITH CONTRAST TECHNIQUE: Multidetector CT imaging of the abdomen and pelvis was performed using the standard protocol following bolus administration of intravenous contrast. CONTRAST:  132m OMNIPAQUE IOHEXOL 300 MG/ML  SOLN COMPARISON:  Chest CT 01/24/2018 FINDINGS: Lower chest: Moderate stable cardiomegaly. Calcified plaque over the right coronary artery. Mild interstitial changes and bronchiectasis over the lung bases which is stable. Calcified plaque over the  descending thoracic aorta. Hepatobiliary: Mild low-attenuation of the liver without focal mass. Gallbladder and biliary tree are normal. Pancreas: Normal. Spleen: Normal. Adrenals/Urinary Tract: Adrenal glands are normal and symmetric. Kidneys are normal in size with several small cysts. No hydronephrosis or nephrolithiasis. Ureters and bladder are normal. Stomach/Bowel: Stomach and small bowel are normal. Appendix is normal. Colon is unremarkable. Vascular/Lymphatic: Mild-to-moderate calcified plaque over the abdominal aorta. No adenopathy. Reproductive: Normal. Other: Minimal free peritoneal fluid. No free peritoneal air. No focal inflammatory change. Mild subcutaneous edema over the abdomen/pelvis. Musculoskeletal: Degenerative change of the spine and hips. IMPRESSION: No acute findings in the abdomen/pelvis. Minimal nonspecific free peritoneal fluid. Mild stable cardiomegaly. Stable interstitial changes and bronchiectasis in the lung bases. Mild hepatic steatosis without focal mass. Bilateral renal cysts. Aortic Atherosclerosis (ICD10-I70.0). Electronically Signed   By: DMarin OlpM.D.   On: 08/01/2018 17:22   Dg  Chest Portable 1 View  Result Date: 08/01/2018 CLINICAL DATA:  Hypoxia, abdominal pain and feet swelling 2-3 days. EXAM: PORTABLE CHEST 1 VIEW COMPARISON:  01/24/2018 FINDINGS: Lungs are adequately inflated demonstrate hazy prominence of the perihilar vessels suggesting mild interstitial edema. No definite effusion. Mild stable cardiomegaly. Remainder the exam is unchanged. IMPRESSION: Mild stable cardiomegaly with evidence suggesting mild interstitial edema. Electronically Signed   By: Marin Olp M.D.   On: 08/01/2018 14:37        Scheduled Meds: . amLODipine  10 mg Oral Daily  . aspirin EC  81 mg Oral QHS  . enoxaparin (LOVENOX) injection  40 mg Subcutaneous Q24H  . furosemide  40 mg Intravenous BID  . sodium chloride flush  3 mL Intravenous Q12H   Continuous Infusions: .  sodium chloride    . cefTRIAXone (ROCEPHIN)  IV Stopped (08/02/18 2044)     LOS: 2 days    Time spent: 30 minutes    Sumit Branham Darleen Crocker, DO [facility] Pager [phone]  If 7PM-7AM, please contact night-coverage [email or web address] Password [facility] 08/03/2018, 11:51 AM

## 2018-08-03 NOTE — Care Management Important Message (Signed)
Important Message  Patient Details  Name: Marisa Bailey MRN: [id number] Date of Birth: [birth year]   Medicare Important Message Given:  Yes     [name] 08/03/2018, 3:00 PM

## 2018-08-04 LAB — BASIC METABOLIC PANEL
Anion gap: 10 (ref 5–15)
BUN: 15 mg/dL (ref 8–23)
CO2: 38 mmol/L — ABNORMAL HIGH (ref 22–32)
Calcium: 8.6 mg/dL — ABNORMAL LOW (ref 8.9–10.3)
Chloride: 93 mmol/L — ABNORMAL LOW (ref 98–111)
Creatinine, Ser: 0.71 mg/dL (ref 0.44–1.00)
GFR calc Af Amer: >60 mL/min (ref >60)
GFR calc non Af Amer: >60 mL/min (ref >60)
Glucose, Bld: 91 mg/dL (ref 70–99)
Potassium: 4.2 mmol/L (ref 3.5–5.1)
Sodium: 141 mmol/L (ref 135–145)

## 2018-08-04 NOTE — Progress Notes (Addendum)
PROGRESS NOTE    Marisa Bailey  MVE:[id number] DOB: 10-14-1940 DOA: 08/01/2018 PCP: Lucia Gaskins, MD   Brief Narrative:  Per HPI: Marisa E Cummingsis a 78 y.o.femalewith a history of COPD, Hypertension. Herefor shortness of breath that has been worsening over the past 2 to 3 days. Breathing is worse with ambulation and exertion and improved with rest. No other palliating or provoking factors. Denies cough, wheezing, fevers, chills. Admits to orthopnea symptoms are worsening.  Additionally, she has had some increased lower suprapubic pain without dysuria, but frequency.  Patient was admitted with acute hypercapnic and hypoxemic respiratory failure secondary to acute CHF exacerbation. She has been placed on Lasix IV twice daily with 2D echocardiogram demonstrating LVEF 50-55% with signs of fluid overload. She has started feel somewhat better and has diuresed about 7 L thus far. UTI noted and patient empirically started on Rocephin with cultures demonstrating no growth.  Rocephin has been administered for total 3 days and now discontinued. Previous 2D echocardiogram on 06/2017 with LVEF 50 to 55% and grade 1 diastolic dysfunction.  She has been seen by physical therapy on 6/23 with no recommendations at this time.  Unfortunately she continues to have some acute hypoxemic respiratory failure that persists along with volume overload, but continues to diurese quite well.  Assessment & Plan:   Principal Problem:   Acute respiratory failure with hypoxia and hypercapnia (HCC) Active Problems:   COPD (chronic obstructive pulmonary disease) (HCC)   Acute exacerbation of CHF (congestive heart failure) (HCC)   Hypertension   Acute lower UTI   Acute hypoxemic respiratory failure secondary to diastolic CHF exacerbation-persistent -Continue on Lasix 40 mg IV twice daily as prescribed;appears to be noncompliant with home Lasix which is a likely cause; she continues to diurese very well  with approximately 7 L of diuresis noted thus far -Strict I's and O's and daily weights with good diuresis noted with over 3 L in the last 24 hours. -Repeat 2D echocardiogram with LVEF 50 to 55% and dilated IVC suggesting ongoing volume overload -Wean oxygen as tolerated, still on 2 L -She is noted to desaturate to the mid 70th percentile with ambulation and does not appear to be ready for discharge.  She appears to have no other needs on discharge as far as physical therapy when ready  Suspect UTI -Urine with bacteriuria noted and patient has completed Rocephin treatment for the last 3 days -No further suprapubic pain or growth on urine cultures noted  Hypertension-controlled -Continue current antihypertensives  COPD -Duo nebs as needed with no acute bronchospasms currently noted    DVT prophylaxis:Lovenox Code Status:Full Family Communication:None at bedside; attempted call to niece with no response 6/23 Disposition Plan:Rocephin discontinued on 6/23.  Continue IV Lasix for ongoing diuresis as she has had over 3 L in the last 24 hours.  She continues to desaturate with ambulation and does not wear home oxygen.  Anticipate discharge in the next 24 to 48 hours once adequately diuresed and off oxygen.  Continues to require IV diuresis and oxygen support.  Consultants:  None  Procedures:  None  Antimicrobials:  Rocephin 6/20-6/23   Subjective: Patient seen and evaluated today with no new acute complaints or concerns. No acute concerns or events noted overnight.  She continues to have shortness of breath with ambulation, but denies any chest pain or other concerns.  She remains on 2 L nasal cannula.  Objective: Vitals:   08/03/18 0845 08/03/18 1941 08/03/18 2127 08/04/18 0624  BP: Marland Kitchen)  148/73  (!) 148/60 125/63  Pulse:   68 (!) 58  Resp:   18 18  Temp:   98.2 F (36.8 C) 98.1 F (36.7 C)  TempSrc:   Oral Oral  SpO2:  95% 96% 94%  Weight:    77.8 kg   Height:        Intake/Output Summary (Last 24 hours) at 08/04/2018 1209 Last data filed at 08/04/2018 0700 Gross per 24 hour  Intake -  Output 3900 ml  Net -3900 ml   Filed Weights   08/02/18 0529 08/03/18 0400 08/04/18 0624  Weight: 83 kg 81.9 kg 77.8 kg    Examination:  General exam: Appears calm and comfortable  Respiratory system: Clear to auscultation. Respiratory effort normal.  2 L nasal cannula. Cardiovascular system: S1 & S2 heard, RRR. No JVD, murmurs, rubs, gallops or clicks.  Scant pedal edema. Gastrointestinal system: Abdomen is nondistended, soft and nontender. No organomegaly or masses felt. Normal bowel sounds heard. Central nervous system: Alert and oriented. No focal neurological deficits. Extremities: Symmetric 5 x 5 power. Skin: No rashes, lesions or ulcers Psychiatry: Judgement and insight appear normal. Mood & affect appropriate.     Data Reviewed: I have personally reviewed following labs and imaging studies  CBC: Recent Labs  Lab 08/01/18 1412  WBC 3.7*  NEUTROABS 2.3  HGB 12.8  HCT 44.8  MCV 90.5  PLT 151   Basic Metabolic Panel: Recent Labs  Lab 08/01/18 1412 08/02/18 0606 08/03/18 0447 08/04/18 0610  NA 140 135 141 141  K 4.0 4.2 4.8 4.2  CL 102 96* 96* 93*  CO2 30 31 36* 38*  GLUCOSE 92 82 87 91  BUN _0 CREATININE 0.59 0.67 0.71 0.71  CALCIUM 8.0* 7.8* 8.5* 8.6*   GFR: Estimated Creatinine Clearance: 58.2 mL/min (by C-G formula based on SCr of 0.71 mg/dL). Liver Function Tests: Recent Labs  Lab 08/01/18 1412  AST 16  ALT 16  ALKPHOS 85  BILITOT 0.9  PROT 7.2  ALBUMIN 3.4*   Recent Labs  Lab 08/01/18 1412  LIPASE 26   No results for input(s): AMMONIA in the last 168 hours. Coagulation Profile: No results for input(s): INR, PROTIME in the last 168 hours. Cardiac Enzymes: Recent Labs  Lab 08/01/18 1412 08/02/18 0606 08/02/18 1343 08/02/18 1952  TROPONINI 0.06* 0.06* 0.05* 0.05*   BNP (last 3 results)  No results for input(s): PROBNP in the last 8760 hours. HbA1C: No results for input(s): HGBA1C in the last 72 hours. CBG: No results for input(s): GLUCAP in the last 168 hours. Lipid Profile: No results for input(s): CHOL, HDL, LDLCALC, TRIG, CHOLHDL, LDLDIRECT in the last 72 hours. Thyroid Function Tests: No results for input(s): TSH, T4TOTAL, FREET4, T3FREE, THYROIDAB in the last 72 hours. Anemia Panel: No results for input(s): VITAMINB12, FOLATE, FERRITIN, TIBC, IRON, RETICCTPCT in the last 72 hours. Sepsis Labs: No results for input(s): PROCALCITON, LATICACIDVEN in the last 168 hours.  Recent Results (from the past 240 hour(s))  SARS Coronavirus 2 (CEPHEID- Performed in [place] hospital lab), Hosp Order     Status: None   Collection Time: 08/01/18  1:23 PM   Specimen: Nasopharyngeal Swab  Result Value Ref Range Status   SARS Coronavirus 2 NEGATIVE NEGATIVE Final    Comment: (NOTE) If result is NEGATIVE SARS-CoV-2 target nucleic acids are NOT DETECTED. The SARS-CoV-2 RNA is generally detectable in upper and lower  respiratory specimens during the acute phase of infection. The lowest  concentration of SARS-CoV-2 viral copies this assay can detect is 250  copies / mL. A negative result does not preclude SARS-CoV-2 infection  and should not be used as the sole basis for treatment or other  patient management decisions.  A negative result may occur with  improper specimen collection / handling, submission of specimen other  than nasopharyngeal swab, presence of viral mutation(s) within the  areas targeted by this assay, and inadequate number of viral copies  (<250 copies / mL). A negative result must be combined with clinical  observations, patient history, and epidemiological information. If result is POSITIVE SARS-CoV-2 target nucleic acids are DETECTED. The SARS-CoV-2 RNA is generally detectable in upper and lower  respiratory specimens dur ing the acute phase of  infection.  Positive  results are indicative of active infection with SARS-CoV-2.  Clinical  correlation with patient history and other diagnostic information is  necessary to determine patient infection status.  Positive results do  not rule out bacterial infection or co-infection with other viruses. If result is PRESUMPTIVE POSTIVE SARS-CoV-2 nucleic acids MAY BE PRESENT.   A presumptive positive result was obtained on the submitted specimen  and confirmed on repeat testing.  While 2019 novel coronavirus  (SARS-CoV-2) nucleic acids may be present in the submitted sample  additional confirmatory testing may be necessary for epidemiological  and / or clinical management purposes  to differentiate between  SARS-CoV-2 and other Sarbecovirus currently known to infect humans.  If clinically indicated additional testing with an alternate test  methodology [phone]) is advised. The SARS-CoV-2 RNA is generally  detectable in upper and lower respiratory sp ecimens during the acute  phase of infection. The expected result is Negative. Fact Sheet for Patients:  StrictlyIdeas.no Fact Sheet for Healthcare Providers: BankingDealers.co.za This test is not yet approved or cleared by the [place] FDA and has been authorized for detection and/or diagnosis of SARS-CoV-2 by FDA under an Emergency Use Authorization (EUA).  This EUA will remain in effect (meaning this test can be used) for the duration of the COVID-19 declaration under Section 564(b)(1) of the Act, 21 U.S.C. section 360bbb-3(b)(1), unless the authorization is terminated or revoked sooner. Performed at [facility], [address] [place], [place] [address]   Urine culture     Status: None   Collection Time: 08/01/18  1:43 PM   Specimen: Urine, Clean Catch  Result Value Ref Range Status   Specimen Description   Final    URINE, CLEAN CATCH Performed at [facility], [address], [place], [place] [address]    Special Requests   Final    NONE Performed at [facility], [address], [place], [place] [address]    Culture   Final    NO GROWTH Performed at [facility], [place] [address], [place], [place] [address]    Report Status 08/03/2018 FINAL  Final         Radiology Studies: No results found.      Scheduled Meds: . amLODipine  10 mg Oral Daily  . aspirin EC  81 mg Oral QHS  . enoxaparin (LOVENOX) injection  40 mg Subcutaneous Q24H  . furosemide  40 mg Intravenous BID  . sodium chloride flush  3 mL Intravenous Q12H   Continuous Infusions: . sodium chloride       LOS: 3 days    Time spent: 30 minutes    Sterling Ucci Darleen Crocker, DO [facility] Pager [phone]  If 7PM-7AM, please contact night-coverage  [email or web address] Password [facility] 08/04/2018, 12:09 PM

## 2018-08-04 NOTE — TOC Initial Note (Signed)
Transition of Care [facility]) - Initial/Assessment Note    Patient Details  Name: Marisa Bailey MRN: [id number] Date of Birth: 07-09-1940  Transition [facility]) CM/SW Contact:    Shade Flood, LCSW Phone Number: 08/04/2018, 12:20 PM  Clinical Narrative:                  Pt admitted from home. Spoke with pt today to assess. Pt lives alone. She reports that her niece checks on her frequently. Per pt, she does not use any DME at home. Pt states that she drives and does not have any difficulty getting to medical appointments or obtaining her medications.  Pt denies having any TOC needs for dc.  Expected Discharge Plan: Home/Self Care Barriers to Discharge: Continued Medical Work up   Patient Goals and CMS Choice        Expected Discharge Plan and Services Expected Discharge Plan: Home/Self Care       Living arrangements for the past 2 months: Single Family Home                                      Prior Living Arrangements/Services Living arrangements for the past 2 months: Single Family Home Lives with:: Self Patient language and need for interpreter reviewed:: Yes Do you feel safe going back to the place where you live?: Yes      Need for Family Participation in Patient Care: No (Comment) Care giver support system in place?: Yes (comment)   Criminal Activity/Legal Involvement Pertinent to Current Situation/Hospitalization: No - Comment as needed  Activities of Daily Living Home Assistive Devices/Equipment: None ADL Screening (condition at time of admission) Patient's cognitive ability adequate to safely complete daily activities?: Yes Is the patient deaf or have difficulty hearing?: Yes Does the patient have difficulty seeing, even when wearing glasses/contacts?: Yes Does the patient have difficulty concentrating, remembering, or making decisions?: No Patient able to express need for assistance with ADLs?: Yes Does the patient have difficulty dressing or  bathing?: No Independently performs ADLs?: Yes (appropriate for developmental age) Does the patient have difficulty walking or climbing stairs?: No Weakness of Legs: None Weakness of Arms/Hands: None  Permission Sought/Granted                  Emotional Assessment Appearance:: Appears stated age Attitude/Demeanor/Rapport: Engaged Affect (typically observed): Pleasant Orientation: : Oriented to Self, Oriented to Place, Oriented to  Time, Oriented to Situation Alcohol / Substance Use: Not Applicable Psych Involvement: No (comment)  Admission diagnosis:  Abdominal Pain; Feet Swelling Patient Active Problem List   Diagnosis Date Noted  . Acute respiratory failure with hypoxia and hypercapnia ([place]) 08/01/2018  . Acute lower UTI 08/01/2018  . Hypoxia   . Leg swelling   . Obesity (BMI 30-39.9) 01/24/2018  . Acute respiratory failure with hypoxia ([place]) 06/27/2017  . COPD (chronic obstructive pulmonary disease) ([place]) 06/27/2017  . Acute exacerbation of CHF (congestive heart failure) ([place]) 06/27/2017  . Tobacco abuse 06/27/2017  . Hypertension   . Muscle weakness (generalized) 08/17/2012  . Pain in joint, shoulder region 08/17/2012  . Fx upper humerus-closed 08/17/2012   PCP:  Lucia Gaskins, MD Pharmacy:   [place], [place] - [place] [address] [place] [address] [place] [address] Phone: [phone] Fax: [phone]     Social Determinants of Health (SDOH) Interventions    Readmission Risk Interventions No  flowsheet data found.

## 2018-08-04 NOTE — Evaluation (Signed)
Physical Therapy Evaluation Patient Details Name: Marisa Bailey MRN: [id number] DOB: 11/17/1940 Today's Date: 08/04/2018   History of Present Illness  Marisa Bailey is a 78 y.o. female with a history of COPD, Hypertension. Here for shortness of breath that has been worsening over the past 2 to 3 days.  Breathing is worse with ambulation and exertion and improved with rest.  No other palliating or provoking factors.  Denies cough, wheezing, fevers, chills.  Admits to orthopnea symptoms are worsening.    Clinical Impression  Patient functioning at baseline for functional mobility and gait other SpO2 dropping to 77% while on room air and put back on 2 LPM after ambulation - nurse notified.  Plan:  Patient discharged from physical therapy to care of nursing for ambulation daily as tolerated for length of stay.     Follow Up Recommendations No PT follow up    Equipment Recommendations  None recommended by PT    Recommendations for Other Services       Precautions / Restrictions Precautions Precautions: None Restrictions Weight Bearing Restrictions: No      Mobility  Bed Mobility Overal bed mobility: Independent                Transfers Overall transfer level: Independent                  Ambulation/Gait Ambulation/Gait assistance: Modified independent (Device/Increase time) Gait Distance (Feet): 150 Feet Assistive device: None Gait Pattern/deviations: WFL(Within Functional Limits) Gait velocity: normal   General Gait Details: good return for ambulation in room and hallways without loss of balance, on room air with SpO2 dropping to 77%  Stairs            Wheelchair Mobility    Modified Rankin (Stroke Patients Only)       Balance Overall balance assessment: No apparent balance deficits (not formally assessed)                                           Pertinent Vitals/Pain Pain Assessment: No/denies pain    Home Living  Family/patient expects to be discharged to:: Private residence   Available Help at Discharge: Family Type of Home: Mobile home Home Access: Stairs to enter Entrance Stairs-Rails: None Entrance Stairs-Number of Steps: 2 Home Layout: One level Home Equipment: Cane - single point      Prior Function Level of Independence: Independent         Comments: community ambulator, drives     Hand Dominance   Dominant Hand: Right    Extremity/Trunk Assessment   Upper Extremity Assessment Upper Extremity Assessment: Overall WFL for tasks assessed    Lower Extremity Assessment Lower Extremity Assessment: Overall WFL for tasks assessed    Cervical / Trunk Assessment Cervical / Trunk Assessment: Normal  Communication   Communication: HOH  Cognition Arousal/Alertness: Awake/alert Behavior During Therapy: WFL for tasks assessed/performed Overall Cognitive Status: Within Functional Limits for tasks assessed                                        General Comments      Exercises     Assessment/Plan    PT Assessment Patent does not need any further PT services  PT Problem List  PT Treatment Interventions      PT Goals (Current goals can be found in the Care Plan section)  Acute Rehab PT Goals Patient Stated Goal: return home PT Goal Formulation: With patient Time For Goal Achievement: 08/04/18 Potential to Achieve Goals: Good    Frequency     Barriers to discharge        Co-evaluation               AM-PAC PT "6 Clicks" Mobility  Outcome Measure Help needed turning from your back to your side while in a flat bed without using bedrails?: None Help needed moving from lying on your back to sitting on the side of a flat bed without using bedrails?: None Help needed moving to and from a bed to a chair (including a wheelchair)?: None Help needed standing up from a chair using your arms (e.g., wheelchair or bedside chair)?: None Help  needed to walk in hospital room?: None Help needed climbing 3-5 steps with a railing? : None 6 Click Score: 24    End of Session Equipment Utilized During Treatment: Oxygen Activity Tolerance: Patient tolerated treatment well Patient left: in bed;with call bell/phone within reach Nurse Communication: Mobility status PT Visit Diagnosis: Unsteadiness on feet (R26.81);Other abnormalities of gait and mobility (R26.89);Muscle weakness (generalized) (M62.81)    Time: 5797-2820 PT Time Calculation (min) (ACUTE ONLY): 19 min   Charges:   PT Evaluation $PT Eval Low Complexity: 1 Low PT Treatments $Gait Training: 8-22 mins        1:56 PM, 08/04/18 Lonell Grandchild, MPT Physical Therapist with [facility] [phone] office [phone] mobile phone

## 2018-08-05 ENCOUNTER — Encounter (HOSPITAL_COMMUNITY): Payer: Self-pay | Admitting: Family Medicine

## 2018-08-05 DIAGNOSIS — I5033 Acute on chronic diastolic (congestive) heart failure: Secondary | ICD-10-CM

## 2018-08-05 LAB — CBC
HCT: 50.8 % — ABNORMAL HIGH (ref 36.0–46.0)
Hemoglobin: 14.6 g/dL (ref 12.0–15.0)
MCH: 24.8 pg — ABNORMAL LOW (ref 26.0–34.0)
MCHC: 28.7 g/dL — ABNORMAL LOW (ref 30.0–36.0)
MCV: 86.4 fL (ref 80.0–100.0)
Platelets: 265 K/uL (ref 150–400)
RBC: 5.88 MIL/uL — ABNORMAL HIGH (ref 3.87–5.11)
RDW: 17.8 % — ABNORMAL HIGH (ref 11.5–15.5)
WBC: 5.1 K/uL (ref 4.0–10.5)
nRBC: 0 % (ref 0.0–0.2)

## 2018-08-05 LAB — BASIC METABOLIC PANEL WITH GFR
Anion gap: 13 (ref 5–15)
BUN: 18 mg/dL (ref 8–23)
CO2: 37 mmol/L — ABNORMAL HIGH (ref 22–32)
Calcium: 8.9 mg/dL (ref 8.9–10.3)
Chloride: 89 mmol/L — ABNORMAL LOW (ref 98–111)
Creatinine, Ser: 0.75 mg/dL (ref 0.44–1.00)
GFR calc Af Amer: >60 mL/min (ref >60)
GFR calc non Af Amer: >60 mL/min (ref >60)
Glucose, Bld: 97 mg/dL (ref 70–99)
Potassium: 4.1 mmol/L (ref 3.5–5.1)
Sodium: 139 mmol/L (ref 135–145)

## 2018-08-05 LAB — MAGNESIUM: Magnesium: 2 mg/dL (ref 1.7–2.4)

## 2018-08-05 MED ORDER — FUROSEMIDE 40 MG PO TABS: 40.0000 mg | ORAL_TABLET | Freq: Every day | ORAL | 0 refills | Status: DC

## 2018-08-05 MED ORDER — POTASSIUM CHLORIDE CRYS ER 10 MEQ PO TBCR: 10.0000 meq | EXTENDED_RELEASE_TABLET | Freq: Every day | ORAL | 0 refills | Status: DC

## 2018-08-05 NOTE — Discharge Summary (Addendum)
Physician Discharge Summary  Marisa Bailey BVA:[id number] DOB: 04-28-1940 DOA: 08/01/2018  PCP: Lucia Gaskins, MD  Admit date: 08/01/2018 Discharge date: 08/05/2018  Admitted From:  Home  Disposition: Home   Recommendations for Outpatient Follow-up:  1. Follow up with PCP in 1 weeks 2. Follow up with cardiology in 2 weeks 3. Please obtain BMP/CBC in 1-2 weeks to follow electrolytes and renal function  Discharge Condition: STABLE   CODE STATUS: FULL    Brief Hospitalization Summary: Please see all hospital notes, images, labs for full details of the hospitalization. Dr. Glenna Durand HPI: Marisa Bailey is a 78 y.o. female with a history of COPD, Hypertension. Here for shortness of breath that has been worsening over the past 2 to 3 days.  Breathing is worse with ambulation and exertion and improved with rest.  No other palliating or provoking factors.  Denies cough, wheezing, fevers, chills.  Admits to orthopnea symptoms are worsening.  Additionally, she has had some increased lower suprapubic pain without dysuria, but frequency.  Emergency Department Course: Chest x-ray shows pulmonary congestion.  Elevated BNP with stable troponin from previous of 0.06.  The patient was admitted with acute diastolic congestive heart failure and responded very well to IV Lasix.  She was treated with 40 mg IV twice daily and has diuresed nearly 8 L since admission.  She is feeling much better.  Her renal function has remained stable.  She is being weaned off oxygen.  Her 2D echocardiogram revealed LVEF 50 to 55%.  She was also treated for suspected UTI but the urine culture has shown no growth.  She was treated with ceftriaxone for 3 days and it was discontinued.  Her other medical problems have remained stable.  She is stable to discharge home with outpatient follow-up with her primary care provider.  I have also requested that she follow-up with cardiology to establish care given that this is her second  hospitalization for this.  She agreed to follow-up at heart care in [place].  She will discharged on Lasix 40 mg daily and potassium 10 mEq daily.  Recheck BMP with PCP in 1 to 2 weeks recommended.  Patient advised to monitor daily weights.  Patient advised and counseled to stop smoking cigarettes.  Discharge Diagnoses:  Principal Problem:   Acute respiratory failure with hypoxia and hypercapnia (HCC) Active Problems:   COPD (chronic obstructive pulmonary disease) (HCC)   Acute exacerbation of CHF (congestive heart failure) (HCC)   Hypertension   Acute lower UTI   Discharge Instructions: Discharge Instructions    (HEART FAILURE PATIENTS) Call MD:  Anytime you have any of the following symptoms: 1) 3 pound weight gain in 24 hours or 5 pounds in 1 week 2) shortness of breath, with or without a dry hacking cough 3) swelling in the hands, feet or stomach 4) if you have to sleep on extra pillows at night in order to breathe.   Complete by: As directed    Call MD for:  difficulty breathing, headache or visual disturbances   Complete by: As directed    Call MD for:  persistant dizziness or light-headedness   Complete by: As directed    Call MD for:  persistant nausea and vomiting   Complete by: As directed    Diet - low sodium heart healthy   Complete by: As directed    Increase activity slowly   Complete by: As directed      Allergies as of 08/05/2018  Reactions   Penicillins Rash   Has patient had a PCN reaction causing immediate rash, facial/tongue/throat swelling, SOB or lightheadedness with hypotension: {Yes/No:[id number] Has patient had a PCN reaction causing severe rash involving mucus membranes or skin necrosis: Yes Has patient had a PCN reaction that required hospitalization No Has patient had a PCN reaction occurring within the last 10 years: No If all of the above answers are "NO", then may proceed with Cephalosporin use. **Has tolerated keflex      Medication List     TAKE these medications   amLODipine 10 MG tablet Commonly known as: NORVASC Take 10 mg by mouth daily.   aspirin EC 81 MG tablet Take 81 mg by mouth at bedtime.   folic acid 1 MG tablet Commonly known as: FOLVITE Take 1 mg by mouth daily.   furosemide 40 MG tablet Commonly known as: LASIX Take 1 tablet (40 mg total) by mouth daily. Start taking on: August 07, 2018 What changed:   medication strength  how much to take  These instructions start on August 07, 2018. If you are unsure what to do until then, ask your doctor or other care provider.   olmesartan 40 MG tablet Commonly known as: BENICAR Take 40 mg by mouth daily.   potassium chloride 10 MEQ tablet Commonly known as: K-DUR Take 1 tablet (10 mEq total) by mouth daily. Start taking on: August 07, 2018   ProAir HFA 108 (90 Base) MCG/ACT inhaler Generic drug: albuterol Inhale 1-2 puffs into the lungs every 4 (four) hours as needed for wheezing.      Follow-up Information    Lucia Gaskins, MD. Schedule an appointment as soon as possible for a visit in 1 week(s).   Specialty: Internal Medicine Why: Hospital follow up   Contact information: [place] [address] [address], [place], [place]. Schedule an appointment as soon as possible for a visit in 2 week(s).   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 Penicillins Rash    Has patient had a PCN reaction causing immediate rash, facial/tongue/throat swelling, SOB or lightheadedness with hypotension: {Yes/No:[id number] Has patient had a PCN reaction causing severe rash involving mucus membranes or skin necrosis: Yes Has patient had a PCN reaction that required hospitalization No Has patient had a PCN reaction occurring within the last 10 years: No If  all of the above answers are "NO", then may proceed with Cephalosporin use.  **Has tolerated keflex    Allergies as of 08/05/2018      Reactions   Penicillins Rash   Has patient had a PCN reaction causing immediate rash, facial/tongue/throat swelling, SOB or lightheadedness with hypotension: {Yes/No:[id number] Has patient had a PCN reaction causing severe rash involving mucus membranes or skin necrosis: Yes Has patient had a PCN reaction that required hospitalization No Has patient had a PCN reaction occurring within the last 10 years: No If all of the above answers are "NO", then may proceed with Cephalosporin use. **Has tolerated keflex      Medication List    TAKE these medications   amLODipine 10 MG tablet Commonly known as: NORVASC Take 10 mg by mouth daily.   aspirin EC 81 MG tablet Take 81 mg by mouth at bedtime.   folic acid 1 MG  tablet Commonly known as: FOLVITE Take 1 mg by mouth daily.   furosemide 40 MG tablet Commonly known as: LASIX Take 1 tablet (40 mg total) by mouth daily. Start taking on: August 07, 2018 What changed:   medication strength  how much to take  These instructions start on August 07, 2018. If you are unsure what to do until then, ask your doctor or other care provider.   olmesartan 40 MG tablet Commonly known as: BENICAR Take 40 mg by mouth daily.   potassium chloride 10 MEQ tablet Commonly known as: K-DUR Take 1 tablet (10 mEq total) by mouth daily. Start taking on: August 07, 2018   ProAir HFA 108 (90 Base) MCG/ACT inhaler Generic drug: albuterol Inhale 1-2 puffs into the lungs every 4 (four) hours as needed for wheezing.       Procedures/Studies: Ct Abdomen Pelvis W Contrast  Result Date: 08/01/2018 CLINICAL DATA:  Abdominal pain and feet swelling 2-3 days.  Hypoxia. EXAM: CT ABDOMEN AND PELVIS WITH CONTRAST TECHNIQUE: Multidetector CT imaging of the abdomen and pelvis was performed using the standard protocol following bolus  administration of intravenous contrast. CONTRAST:  119m OMNIPAQUE IOHEXOL 300 MG/ML  SOLN COMPARISON:  Chest CT 01/24/2018 FINDINGS: Lower chest: Moderate stable cardiomegaly. Calcified plaque over the right coronary artery. Mild interstitial changes and bronchiectasis over the lung bases which is stable. Calcified plaque over the descending thoracic aorta. Hepatobiliary: Mild low-attenuation of the liver without focal mass. Gallbladder and biliary tree are normal. Pancreas: Normal. Spleen: Normal. Adrenals/Urinary Tract: Adrenal glands are normal and symmetric. Kidneys are normal in size with several small cysts. No hydronephrosis or nephrolithiasis. Ureters and bladder are normal. Stomach/Bowel: Stomach and small bowel are normal. Appendix is normal. Colon is unremarkable. Vascular/Lymphatic: Mild-to-moderate calcified plaque over the abdominal aorta. No adenopathy. Reproductive: Normal. Other: Minimal free peritoneal fluid. No free peritoneal air. No focal inflammatory change. Mild subcutaneous edema over the abdomen/pelvis. Musculoskeletal: Degenerative change of the spine and hips. IMPRESSION: No acute findings in the abdomen/pelvis. Minimal nonspecific free peritoneal fluid. Mild stable cardiomegaly. Stable interstitial changes and bronchiectasis in the lung bases. Mild hepatic steatosis without focal mass. Bilateral renal cysts. Aortic Atherosclerosis (ICD10-I70.0). Electronically Signed   By: DMarin OlpM.D.   On: 08/01/2018 17:22   Dg Chest Portable 1 View  Result Date: 08/01/2018 CLINICAL DATA:  Hypoxia, abdominal pain and feet swelling 2-3 days. EXAM: PORTABLE CHEST 1 VIEW COMPARISON:  01/24/2018 FINDINGS: Lungs are adequately inflated demonstrate hazy prominence of the perihilar vessels suggesting mild interstitial edema. No definite effusion. Mild stable cardiomegaly. Remainder the exam is unchanged. IMPRESSION: Mild stable cardiomegaly with evidence suggesting mild interstitial edema.  Electronically Signed   By: DMarin OlpM.D.   On: 08/01/2018 14:37      Subjective: The patient says she feels much better and she feels that she can manage well at home.  She denies chest pain and shortness of breath.  She has been ambulating in the room.  Discharge Exam: Vitals:   08/04/18 2118 08/05/18 0558  BP: (!) 142/63 140/66  Pulse: 65 64  Resp: 18 18  Temp: 98.6 F (37 C) 98.3 F (36.8 C)  SpO2: 94% 95%   Vitals:   08/04/18 0624 08/04/18 1335 08/04/18 2118 08/05/18 0558  BP: 125/63 131/61 (!) 142/63 140/66  Pulse: (!) 58 62 65 64  Resp: _0 Temp: 98.1 F (36.7 C) 98 F (36.7 C) 98.6 F (37 C) 98.3 F (36.8 C)  TempSrc: Oral Oral Oral Oral  SpO2: 94% 95% 94% 95%  Weight: 77.8 kg   74.6 kg  Height:       General: Pt is alert, awake, not in acute distress Cardiovascular: RRR, normal S1/S2 +, no rubs, no gallops Respiratory: CTA bilaterally, no wheezing, no rhonchi Abdominal: Soft, NT, ND, bowel sounds + Extremities: trace bilateral LE edema, no cyanosis   The results of significant diagnostics from this hospitalization (including imaging, microbiology, ancillary and laboratory) are listed below for reference.     Microbiology: Recent Results (from the past 240 hour(s))  SARS Coronavirus 2 (CEPHEID- Performed in [place] hospital lab), Hosp Order     Status: None   Collection Time: 08/01/18  1:23 PM   Specimen: Nasopharyngeal Swab  Result Value Ref Range Status   SARS Coronavirus 2 NEGATIVE NEGATIVE Final    Comment: (NOTE) If result is NEGATIVE SARS-CoV-2 target nucleic acids are NOT DETECTED. The SARS-CoV-2 RNA is generally detectable in upper and lower  respiratory specimens during the acute phase of infection. The lowest  concentration of SARS-CoV-2 viral copies this assay can detect is 250  copies / mL. A negative result does not preclude SARS-CoV-2 infection  and should not be used as the sole basis for treatment or other  patient  management decisions.  A negative result may occur with  improper specimen collection / handling, submission of specimen other  than nasopharyngeal swab, presence of viral mutation(s) within the  areas targeted by this assay, and inadequate number of viral copies  (<250 copies / mL). A negative result must be combined with clinical  observations, patient history, and epidemiological information. If result is POSITIVE SARS-CoV-2 target nucleic acids are DETECTED. The SARS-CoV-2 RNA is generally detectable in upper and lower  respiratory specimens dur ing the acute phase of infection.  Positive  results are indicative of active infection with SARS-CoV-2.  Clinical  correlation with patient history and other diagnostic information is  necessary to determine patient infection status.  Positive results do  not rule out bacterial infection or co-infection with other viruses. If result is PRESUMPTIVE POSTIVE SARS-CoV-2 nucleic acids MAY BE PRESENT.   A presumptive positive result was obtained on the submitted specimen  and confirmed on repeat testing.  While 2019 novel coronavirus  (SARS-CoV-2) nucleic acids may be present in the submitted sample  additional confirmatory testing may be necessary for epidemiological  and / or clinical management purposes  to differentiate between  SARS-CoV-2 and other Sarbecovirus currently known to infect humans.  If clinically indicated additional testing with an alternate test  methodology [phone]) is advised. The SARS-CoV-2 RNA is generally  detectable in upper and lower respiratory sp ecimens during the acute  phase of infection. The expected result is Negative. Fact Sheet for Patients:  StrictlyIdeas.no Fact Sheet for Healthcare Providers: BankingDealers.co.za This test is not yet approved or cleared by the [place] FDA and has been authorized for detection and/or diagnosis of SARS-CoV-2 by FDA under  an Emergency Use Authorization (EUA).  This EUA will remain in effect (meaning this test can be used) for the duration of the COVID-19 declaration under Section 564(b)(1) of the Act, 21 U.S.C. section 360bbb-3(b)(1), unless the authorization is terminated or revoked sooner. Performed at [facility], [address] [place], [place] [address]   Urine culture     Status: None   Collection Time: 08/01/18  1:43 PM   Specimen: Urine, Clean Catch  Result Value Ref Range Status  Specimen Description   Final    URINE, CLEAN CATCH Performed at [facility], [address], [place], [place] [address]    Special Requests   Final    NONE Performed at [facility], [address], [place], [place] [address]    Culture   Final    NO GROWTH Performed at [facility], [place] [address] [place], [place] [address]    Report Status 08/03/2018 FINAL  Final     Labs: BNP (last 3 results) Recent Labs    01/24/18 0925 08/01/18 1412  BNP 410.0* 967.5*   Basic Metabolic Panel: Recent Labs  Lab 08/01/18 1412 08/02/18 0606 08/03/18 0447 08/04/18 0610 08/05/18 0636  NA 140 135 141 141 139  K 4.0 4.2 4.8 4.2 4.1  CL 102 96* 96* 93* 89*  CO2 30 31 36* 38* 37*  GLUCOSE 92 82 87 91 97  BUN _0 CREATININE 0.59 0.67 0.71 0.71 0.75  CALCIUM 8.0* 7.8* 8.5* 8.6* 8.9  MG  --   --   --   --  2.0   Liver Function Tests: Recent Labs  Lab 08/01/18 1412  AST 16  ALT 16  ALKPHOS 85  BILITOT 0.9  PROT 7.2  ALBUMIN 3.4*   Recent Labs  Lab 08/01/18 1412  LIPASE 26   No results for input(s): AMMONIA in the last 168 hours. CBC: Recent Labs  Lab 08/01/18 1412 08/05/18 0636  WBC 3.7* 5.1  NEUTROABS 2.3  --   HGB 12.8 14.6  HCT 44.8 50.8*  MCV 90.5 86.4  PLT 202 265   Cardiac Enzymes: Recent Labs  Lab 08/01/18 1412 08/02/18 0606 08/02/18 1343 08/02/18 1952  TROPONINI 0.06* 0.06* 0.05* 0.05*   BNP: Invalid input(s): POCBNP CBG: No results for input(s): GLUCAP in  the last 168 hours. D-Dimer No results for input(s): DDIMER in the last 72 hours. Hgb A1c No results for input(s): HGBA1C in the last 72 hours. Lipid Profile No results for input(s): CHOL, HDL, LDLCALC, TRIG, CHOLHDL, LDLDIRECT in the last 72 hours. Thyroid function studies No results for input(s): TSH, T4TOTAL, T3FREE, THYROIDAB in the last 72 hours.  Invalid input(s): FREET3 Anemia work up No results for input(s): VITAMINB12, FOLATE, FERRITIN, TIBC, IRON, RETICCTPCT in the last 72 hours. Urinalysis    Component Value Date/Time   COLORURINE YELLOW 08/01/2018 1343   APPEARANCEUR HAZY (A) 08/01/2018 1343   LABSPEC 1.011 08/01/2018 1343   PHURINE 5.0 08/01/2018 1343   GLUCOSEU NEGATIVE 08/01/2018 1343   HGBUR NEGATIVE 08/01/2018 1343   BILIRUBINUR NEGATIVE 08/01/2018 1343   KETONESUR NEGATIVE 08/01/2018 1343   PROTEINUR 30 (A) 08/01/2018 1343   NITRITE NEGATIVE 08/01/2018 1343   LEUKOCYTESUR NEGATIVE 08/01/2018 1343   Sepsis Labs Invalid input(s): PROCALCITONIN,  WBC,  LACTICIDVEN Microbiology Recent Results (from the past 240 hour(s))  SARS Coronavirus 2 (CEPHEID- Performed in [place] hospital lab), Hosp Order     Status: None   Collection Time: 08/01/18  1:23 PM   Specimen: Nasopharyngeal Swab  Result Value Ref Range Status   SARS Coronavirus 2 NEGATIVE NEGATIVE Final    Comment: (NOTE) If result is NEGATIVE SARS-CoV-2 target nucleic acids are NOT DETECTED. The SARS-CoV-2 RNA is generally detectable in upper and lower  respiratory specimens during the acute phase of infection. The lowest  concentration of SARS-CoV-2 viral copies this assay can detect is 250  copies / mL. A negative result does not preclude SARS-CoV-2 infection  and should  not be used as the sole basis for treatment or other  patient management decisions.  A negative result may occur with  improper specimen collection / handling, submission of specimen other  than nasopharyngeal swab, presence of  viral mutation(s) within the  areas targeted by this assay, and inadequate number of viral copies  (<250 copies / mL). A negative result must be combined with clinical  observations, patient history, and epidemiological information. If result is POSITIVE SARS-CoV-2 target nucleic acids are DETECTED. The SARS-CoV-2 RNA is generally detectable in upper and lower  respiratory specimens dur ing the acute phase of infection.  Positive  results are indicative of active infection with SARS-CoV-2.  Clinical  correlation with patient history and other diagnostic information is  necessary to determine patient infection status.  Positive results do  not rule out bacterial infection or co-infection with other viruses. If result is PRESUMPTIVE POSTIVE SARS-CoV-2 nucleic acids MAY BE PRESENT.   A presumptive positive result was obtained on the submitted specimen  and confirmed on repeat testing.  While 2019 novel coronavirus  (SARS-CoV-2) nucleic acids may be present in the submitted sample  additional confirmatory testing may be necessary for epidemiological  and / or clinical management purposes  to differentiate between  SARS-CoV-2 and other Sarbecovirus currently known to infect humans.  If clinically indicated additional testing with an alternate test  methodology [phone]) is advised. The SARS-CoV-2 RNA is generally  detectable in upper and lower respiratory sp ecimens during the acute  phase of infection. The expected result is Negative. Fact Sheet for Patients:  StrictlyIdeas.no Fact Sheet for Healthcare Providers: BankingDealers.co.za This test is not yet approved or cleared by the [place] FDA and has been authorized for detection and/or diagnosis of SARS-CoV-2 by FDA under an Emergency Use Authorization (EUA).  This EUA will remain in effect (meaning this test can be used) for the duration of the COVID-19 declaration under Section  564(b)(1) of the Act, 21 U.S.C. section 360bbb-3(b)(1), unless the authorization is terminated or revoked sooner. Performed at [facility], [address] [place], [place] [address]   Urine culture     Status: None   Collection Time: 08/01/18  1:43 PM   Specimen: Urine, Clean Catch  Result Value Ref Range Status   Specimen Description   Final    URINE, CLEAN CATCH Performed at [facility], [address], [place], [place] [address]    Special Requests   Final    NONE Performed at [facility], [address] [place], [place] [address]    Culture   Final    NO GROWTH Performed at [facility], [place] [address] [place],  [address]    Report Status 08/03/2018 FINAL  Final   Time coordinating discharge: 33 minutes   SIGNED:  Irwin Brakeman, MD  [facility] 08/05/2018, 11:36 AM How to contact the [facility] Attending or Consulting provider [place] or covering provider during after hours [place], for this patient?  1. Check the care team in [facility] and look for a) attending/consulting [facility] provider listed and b) the [facility] team listed 2. Log into [email or web address] and use [facility] universal password to access. If you do not have the password, please contact the hospital operator. 3. Locate the [facility] provider you are looking for under [facility] and page to a number that you can be directly reached. 4. If you still have difficulty reaching the provider, please page the [facility] (Director on Call) for the  Hospitalists listed on amion for assistance.

## 2018-08-05 NOTE — Discharge Instructions (Signed)
Heart Failure Eating Plan Heart failure, also called congestive heart failure, occurs when your heart does not pump blood well enough to meet your body's needs for oxygen-rich blood. Heart failure is a long-term (chronic) condition. Living with heart failure can be challenging. However, following your health care provider's instructions about a healthy lifestyle and working with a diet and nutrition specialist (dietitian) to choose the right foods may help to improve your symptoms. What are tips for following this plan? General guidelines  Do not eat more than 2,300 mg of salt (sodium) a day. The amount of sodium that is recommended for you may be lower, depending on your condition.  Maintain a healthy body weight as directed. Ask your health care provider what a healthy weight is for you. ? Check your weight every day. ? Work with your health care provider and dietitian to make a plan that is right for you to lose weight or maintain your current weight.  Limit how much fluid you drink. Ask your health care provider or dietitian how much fluid you can have each day.  Limit or avoid alcohol as told by your health care provider or dietitian. Reading food labels  Check food labels for the amount of sodium per serving. Choose foods that have less than 140 mg (milligrams) of sodium in each serving.  Check food labels for the number of calories per serving. This is important if you need to limit your daily calorie intake to lose weight.  Check food labels for the serving size. If you eat more than one serving, you will be eating more sodium and calories than what is listed on the label.  Look for foods that are labeled as "sodium-free," "very low sodium," or "low sodium." ? Foods labeled as "reduced sodium" or "lightly salted" may still have more sodium than what is recommended for you. Cooking  Avoid adding salt when cooking. Ask your health care provider or dietitian before using salt  substitutes.  Season food with salt-free seasonings, spices, or herbs. Check the label of seasoning mixes to make sure they do not contain salt.  Cook with heart-healthy oils, such as olive, canola, soybean, or sunflower oil.  Do not fry foods. Cook foods using low-fat methods, such as baking, boiling, grilling, and broiling.  Limit unhealthy fats when cooking by: ? Removing the skin from poultry, such as chicken. ? Removing all visible fats from meats. ? Skimming the fat off from stews, soups, and gravies before serving them. Meal planning   Limit your intake of: ? Processed, canned, or pre-packaged foods. ? Foods that are high in trans fat, such as fried foods. ? Sweets, desserts, sugary drinks, and other foods with added sugar. ? Full-fat dairy products, such as whole milk.  Eat a balanced diet that includes: ? 4-5 servings of fruit each day and 4-5 servings of vegetables each day. At each meal, try to fill half of your plate with fruits and vegetables. ? Up to 6-8 servings of whole grains each day. ? Up to 2 servings of lean meat, poultry, or fish each day. One serving of meat is equal to 3 oz. This is about the same size as a deck of cards. ? 2 servings of low-fat dairy each day. ? Heart-healthy fats. Healthy fats called omega-3 fatty acids are found in foods such as flaxseed and cold-water fish like sardines, salmon, and mackerel.  Aim to eat 25-35 g (grams) of fiber a day. Foods that are high in fiber include  apples, broccoli, carrots, beans, peas, and whole grains.  Do not add salt or condiments that contain salt (such as soy sauce) to foods before eating.  When eating at a restaurant, ask that your food be prepared with less salt or no salt, if possible.  Try to eat 2 or more vegetarian meals each week.  Eat more home-cooked food and eat less restaurant, buffet, and fast food. Recommended foods The items listed may not be a complete list. Talk with your dietitian about  what dietary choices are best for you. Grains Bread with less than 80 mg of sodium per slice. Whole-wheat pasta, quinoa, and brown rice. Oats and oatmeal. Barley. [place]. Grits and cream of wheat. Whole-grain and whole-wheat cold cereal. Vegetables All fresh vegetables. Vegetables that are frozen without sauce or added salt. Low-sodium or sodium-free canned vegetables. Fruits All fresh, frozen, and canned fruits. Dried fruits, such as raisins, prunes, and cranberries. Meats and other protein foods Lean cuts of meat. Skinless chicken and [place]. Fish with high omega-3 fatty acids, such as salmon, sardines, and other cold-water fishes. Eggs. Dried beans, peas, and edamame. Unsalted nuts and nut butters. Dairy Low-fat or nonfat (skim) milk and dried milk. Rice milk, soy milk, and almond milk. Low-fat or nonfat yogurt. Small amounts of reduced-sodium block cheese. Low-sodium cottage cheese. Fats and oils Olive, canola, soybean, flaxseed, or sunflower oil. Avocado. Sweets and desserts Apple sauce. Granola bars. Sugar-free pudding and gelatin. Frozen fruit bars. Seasoning and other foods Fresh and dried herbs. Lemon or lime juice. Vinegar. Low-sodium ketchup. Salt-free marinades, salad dressings, sauces, and seasonings. Foods to avoid The items listed may not be a complete list. Talk with your dietitian about what dietary choices are best for you. Grains Bread with more than 80 mg of sodium per slice. Hot or cold cereal with more than 140 mg sodium per serving. Salted pretzels and crackers. Pre-packaged breadcrumbs. Bagels, croissants, and biscuits. Vegetables Canned vegetables. Frozen vegetables with sauce or seasonings. Creamed vegetables. [place] fries. Onion rings. Pickled vegetables and sauerkraut. Fruits Fruits that are dried with sodium-containing preservatives. Meats and other protein foods Ribs and chicken wings. Bacon, ham, pepperoni, bologna, salami, and packaged luncheon meats. Hot  dogs, bratwurst, and sausage. Canned meat. Smoked meat and fish. Salted nuts and seeds. Dairy Whole milk, half-and-half, and cream. Buttermilk. Processed cheese, cheese spreads, and cheese curds. Regular cottage cheese. Feta cheese. Shredded cheese. String cheese. Fats and oils Butter, lard, shortening, ghee, and bacon fat. Canned and packaged gravies. Seasoning and other foods Onion salt, garlic salt, table salt, and sea salt. Marinades. Regular salad dressings. Relishes, pickles, and olives. Meat flavorings and tenderizers, and bouillon cubes. Horseradish, ketchup, and mustard. Worcestershire sauce. Teriyaki sauce, soy sauce (including reduced sodium). Hot sauce and Tabasco sauce. Steak sauce, fish sauce, oyster sauce, and cocktail sauce. Taco seasonings. Barbecue sauce. Tartar sauce. Summary  A heart failure eating plan includes changes that limit your intake of sodium and unhealthy fat, and it may help you lose weight or maintain a healthy weight. Your health care provider may also recommend limiting how much fluid you drink.  Most people with heart failure should eat no more than 2,300 mg of salt (sodium) a day. The amount of sodium that is recommended for you may be lower, depending on your condition.  Contact your health care provider or dietitian before making any major changes to your diet. This information is not intended to replace advice given to you by your health care provider. Make sure  you discuss any questions you have with your health care provider. Document Released: 06/14/2016 Document Revised: 06/14/2016 Document Reviewed: 06/14/2016 Elsevier Interactive Patient Education  2019 [place].   Heart Failure Exacerbation  Heart failure is a condition in which the heart does not fill up with enough blood, and therefore does not pump enough blood and oxygen to the body. When this happens, parts of the body do not get the blood and oxygen they need to function properly. This can  cause symptoms such as breathing problems, fatigue, swelling, and confusion. Heart failure exacerbation refers to heart failure symptoms that get worse. The symptoms may get worse suddenly or develop slowly over time. Heart failure exacerbation is a serious medical problem that should be treated right away. What are the causes? A heart failure exacerbation can be triggered by:  Not taking your heart failure medicines correctly.  Infections.  Eating an unhealthy diet or a diet that is high in salt (sodium).  Drinking too much fluid.  Drinking alcohol.  Taking illegal drugs, such as cocaine or methamphetamine.  Not exercising. Other causes include:  Other heart conditions such as an irregular heartbeat (arrhythmia).  Anemia.  Other medical problems, such as kidney failure. Sometimes the cause of the exacerbation is not known. What are the signs or symptoms? When heart failure symptoms suddenly or slowly get worse, this may be a sign of heart failure exacerbation. Symptoms of heart failure include:  Breathing problems or shortness of breath.  Chronic coughing or wheezing.  Fatigue.  Nausea or lack of appetite.  Feeling light-headed.  Confusion or memory loss.  Increased heart rate or irregular heartbeat.  Buildup of fluid in the legs, ankles, feet, or abdomen.  Difficulty breathing when lying down. How is this diagnosed? This condition is diagnosed based on:  Your symptoms and medical history.  A physical exam. You may also have tests, including:  Electrocardiogram (ECG). This test measures the electrical activity of your heart.  Echocardiogram. This test uses sound waves to take a picture of your heart to see how well it works.  Blood tests.  Imaging tests, such as: ? Chest X-ray. ? MRI. ? Ultrasound.  Stress test. This test examines how well your heart functions when you exercise. Your heart is monitored while you exercise on a treadmill or exercise  bike. If you cannot exercise, medicines may be used to increase your heartbeat in place of exercise.  Cardiac catheterization. During this test, a thin, flexible tube (catheter) is inserted into a blood vessel and threaded up to your heart. This test allows your health care provider to check the arteries that lead to your heart (coronary arteries).  Right heart catheterization. During this test, the pressure in your heart is measured. How is this treated? This condition may be treated by:  Adjusting your heart medicines.  Maintaining a healthy lifestyle. This includes: ? Eating a heart-healthy diet that is low in sodium. ? Not using any products that contain nicotine or tobacco, such as cigarettes and e-cigarettes. ? Regular exercise. ? Monitoring your fluid intake. ? Monitoring your weight and reporting changes to your health care provider.  Treating sleep apnea, if you have this condition.  Surgery. This may include: ? Implanting a device that helps both sides of your heart contract at the same time (cardiac resynchronization therapy device). This can help with heart function and relieve heart failure symptoms. ? Implanting a device that can correct heart rhythm problems (implantable cardioverter defibrillator). ? Connecting a device  to your heart to help it pump blood (ventricular assist device). ? Heart transplant. Follow these instructions at home: Medicines  Take over-the-counter and prescription medicines only as told by your health care provider.  Do not stop taking your medicines or change the amount you take. If you are having problems or side effects from your medicines, talk to your health care provider.  If you are having difficulty paying for your medicines, contact a social worker or your clinic. There are many programs to assist with medicine costs.  Talk to your health care provider before starting any new medicines or supplements.  Make sure your health care  provider and pharmacist have a list of all the medicines you are taking. Eating and drinking   Avoid drinking alcohol.  Eat a heart-healthy diet as told by your health care provider. This includes: ? Plenty of fruits and vegetables. ? Lean proteins. ? Low-fat dairy. ? Whole grains. ? Foods that are low in sodium. Activity   Exercise regularly as told by your health care provider. Balance exercise with rest.  Ask your health care provider what activities are safe for you. This includes sexual activity, exercise, and daily tasks at home or work. Lifestyle  Do not use any products that contain nicotine or tobacco, such as cigarettes and e-cigarettes. If you need help quitting, ask your health care provider.  Maintain a healthy weight. Ask your health care provider what weight is healthy for you.  Consider joining a patient support group. This can help with emotional problems you may have, such as stress and anxiety. General instructions  Talk to your health care provider about flu and pneumonia vaccines.  Keep a list of medicines that you are taking. This may help in emergency situations.  Keep all follow-up visits as told by your health care provider. This is important. Contact a health care provider if:  You have questions about your medicines or you miss a dose.  You feel anxious, depressed, or stressed.  You have swelling in your feet, ankles, legs, or abdomen.  You have shortness of breath during activity or exercise.  You have a cough.  You have a fever.  You have trouble sleeping.  You gain 2-3 lb (1-1.4 kg) in 24 hours or 5 lb (2.3 kg) in a week. Get help right away if:  You have chest pain.  You have shortness of breath while resting.  You have severe fatigue.  You are confused.  You have severe dizziness.  You have a rapid or irregular heartbeat.  You have nausea or you vomit.  You have a cough that is worse at night or you cannot lie  flat.  You have a cough that will not go away.  You have severe depression or sadness. Summary  When heart failure symptoms get worse, it is called heart failure exacerbation.  Common causes of this condition include taking medicines incorrectly, infections, and drinking alcohol.  This condition may be treated by adjusting medicines, maintaining a healthy lifestyle, or surgery.  Do not stop taking your medicines or change the amount you take. If you are having problems or side effects from your medicines, talk to your health care provider. This information is not intended to replace advice given to you by your health care provider. Make sure you discuss any questions you have with your health care provider. Document Released: 06/11/2016 Document Revised: 06/11/2016 Document Reviewed: 06/11/2016 Elsevier Interactive Patient Education  2019 [place].   Heart Failure Action  Plan A heart failure action plan helps you understand what to do when you have symptoms of heart failure. Follow the plan that was created by you and your health care provider. Review your plan each time you visit your health care provider. Red zone These signs and symptoms mean you should get medical help right away:  You have trouble breathing when resting.  You have a dry cough that is getting worse.  You have swelling or pain in your legs or abdomen that is getting worse.  You suddenly gain more than 2-3 lb (0.9-1.4 kg) in a day, or more than 5 lb (2.3 kg) in one week. This amount may be more or less depending on your condition.  You have trouble staying awake or you feel confused.  You have chest pain.  You do not have an appetite.  You pass out. If you experience any of these symptoms:  Call your local emergency services (911 in the [place] right away or seek help at the emergency department of the nearest hospital. Yellow zone These signs and symptoms mean your condition may be getting worse and  you should make some changes:  You have trouble breathing when you are active or you need to sleep with extra pillows.  You have swelling in your legs or abdomen.  You gain 2-3 lb (0.9-1.4 kg) in one day, or 5 lb (2.3 kg) in one week. This amount may be more or less depending on your condition.  You get tired easily.  You have trouble sleeping.  You have a dry cough. If you experience any of these symptoms:  Contact your health care provider within the next day.  Your health care provider may adjust your medicines. Green zone These signs mean you are doing well and can continue what you are doing:  You do not have shortness of breath.  You have very little swelling or no new swelling.  Your weight is stable (no gain or loss).  You have a normal activity level.  You do not have chest pain or any other new symptoms. Follow these instructions at home:  Take over-the-counter and prescription medicines only as told by your health care provider.  Weigh yourself daily. Your target weight is __________ lb (__________ kg). ? Call your health care provider if you gain more than __________ lb (__________ kg) in a day, or more than __________ lb (__________ kg) in one week.  Eat a heart-healthy diet. Work with a diet and nutrition specialist (dietitian) to create an eating plan that is best for you.  Keep all follow-up visits as told by your health care provider. This is important. Where to find more information  American Heart Association: www.heart.org Summary  Follow the action plan that was created by you and your health care provider.  Get help right away if you have any symptoms in the Red zone. This information is not intended to replace advice given to you by your health care provider. Make sure you discuss any questions you have with your health care provider. Document Released: 03/09/2016 Document Revised: 10/02/2016 Document Reviewed: 03/09/2016 Elsevier Interactive  Patient Education  2019 [place].   Heart Failure  Heart failure means your heart has trouble pumping blood. This makes it hard for your body to work well. Heart failure is usually a long-term (chronic) condition. You must take good care of yourself and follow your treatment plan from your doctor. Follow these instructions at home: Medicines  Take over-the-counter and prescription  medicines only as told by your doctor. ? Do not stop taking your medicine unless your doctor told you to do that. ? Do not skip any doses. ? Refill your prescriptions before you run out of medicine. You need your medicines every day. Eating and drinking   Eat heart-healthy foods. Talk with a diet and nutrition specialist (dietitian) to make an eating plan.  Choose foods that: ? Have no trans fat. ? Are low in saturated fat and cholesterol.  Choose healthy foods, like: ? Fresh or frozen fruits and vegetables. ? Fish. ? Low-fat (lean) meats. ? Legumes (like beans, peas, and lentils). ? Fat-free or low-fat dairy products. ? Whole-grain foods. ? High-fiber foods.  Limit salt (sodium) if told by your doctor. Ask your nutrition specialist to recommend heart-healthy seasonings.  Cook in healthy ways instead of frying. Healthy ways of cooking include: ? Roasting. ? Grilling. ? Broiling. ? Baking. ? Poaching. ? Steaming. ? Stir-frying.  Limit how much fluid you drink, if told by your doctor. Lifestyle  Do not smoke or use chewing tobacco. Do not use nicotine gum or patches before talking to your doctor.  Limit alcohol intake to no more than 1 drink a day for non-pregnant women and 2 drinks a day for men. One drink equals 12 oz of beer, 5 oz of wine, or 1 oz of hard liquor. ? Tell your doctor if you drink alcohol many times a week. ? Talk with your doctor about whether any alcohol is safe for you. ? You should stop drinking alcohol: ? If your heart has been damaged by alcohol. ? You have very  bad heart failure.  Do not use illegal drugs.  Lose weight if told by your doctor.  Do moderate physical activity if told by your doctor. Ask your doctor what activities are safe for you if: ? You are of older age (elderly). ? You have very bad heart failure. Keep track of important information  Weigh yourself every day. ? Weigh yourself every morning after you pee (urinate) and before breakfast. ? Wear the same amount of clothing each time. ? Write down your daily weight. Give your record to your doctor.  Check and write down your blood pressure as told by your doctor.  Check your pulse as told by your doctor. Dealing with heat and cold  If the weather is very hot: ? Avoid activity that takes a lot of energy. ? Use air conditioning or fans, or find a cooler place. ? Avoid caffeine. ? Avoid alcohol. ? Wear clothing that is loose-fitting, lightweight, and light-colored.  If the weather is very cold: ? Avoid activity that takes a lot of energy. ? Layer your clothes. ? Wear mittens or gloves, a hat, and a scarf when you go outside. ? Avoid alcohol. General instructions  Manage other conditions that you have as told by your doctor.  Learn to manage stress. If you need help, ask your doctor.  Plan rest periods for when you get tired.  Get education and support as needed.  Get rehab (rehabilitation) to help you stay independent and to help with everyday tasks.  Stay up to date with shots (immunizations), especially pneumococcal and flu (influenza) shots.  Keep all follow-up visits as told by your doctor. This is important. Contact a doctor if:  You gain weight quickly.  You are more short of breath than normal.  You cannot do your normal activities.  You tire easily.  You cough more than normal, especially  with activity.  You have any or more puffiness (swelling) in areas such as your hands, feet, ankles, or belly (abdomen).  You cannot sleep because it is hard  to breathe.  You feel like your heart is beating fast (palpitations).  You get dizzy or light-headed when you stand up. Get help right away if:  You have trouble breathing.  You or someone else notices a change in your awareness. This could be trouble staying awake or trouble concentrating.  You have chest pain or discomfort.  You pass out (faint). Summary  Heart failure means your heart has trouble pumping blood.  Make sure you refill your prescriptions before you run out of medicine. You need your medicines every day.  Keep records of your weight and blood pressure to give to your doctor.  Contact a doctor if you gain weight quickly. This information is not intended to replace advice given to you by your health care provider. Make sure you discuss any questions you have with your health care provider. Document Released: 11/07/2007 Document Revised: 10/22/2017 Document Reviewed: 02/20/2016 Elsevier Interactive Patient Education  2019 [place] With Heart Failure  Heart failure is a long-term (chronic) condition in which the heart cannot pump enough blood through the body. When this happens, parts of the body do not get the blood and oxygen they need. There is no cure for heart failure at this time, so it is important for you to take good care of yourself and follow the treatment plan set by your health care provider. If you are living with heart failure, there are ways to help you manage the disease. Follow these instructions at home: Living with heart failure requires you to make changes in your life. Your health care team will teach you about the changes you need to make in order to relieve your symptoms and lower your risk of going to the hospital. Follow the treatment plan as set by your health care provider. Medicines Medicines are important in reducing your heart's workload, slowing the progression of heart failure, and improving your symptoms.  Take  over-the-counter and prescription medicines only as told by your health care provider.  Do not stop taking your medicine unless your health care provider tells you to do that.  Do not skip any dose of your medicine.  Refill prescriptions before you run out of medicine. You need your medicines every day. Eating and drinking   Eat heart-healthy foods. Talk with a dietitian to make an eating plan that is right for you. ? If directed by your health care provider: ? Limit salt (sodium). Lowering your sodium intake may reduce symptoms of heart failure. Ask a dietitian to recommend heart-healthy seasonings. ? Limit your fluid intake. Fluid restriction may reduce symptoms of heart failure. ? Use low-fat cooking methods instead of frying. Low-fat methods include roasting, grilling, broiling, baking, poaching, steaming, and stir-frying. ? Choose foods that contain no trans fat and are low in saturated fat and cholesterol. Healthy choices include fresh or frozen fruits and vegetables, fish, lean meats, legumes, fat-free or low-fat dairy products, and whole-grain or high-fiber foods.  Limit alcohol intake to no more than 1 drink a day for nonpregnant women and 2 drinks a day for men. One drink equals 12 oz of beer, 5 oz of wine, or 1 oz of hard liquor. ? Drinking more than that is harmful to your heart. Tell your health care provider if you drink alcohol several times a week. ?  Talk with your health care provider about whether any level of alcohol use is safe for you. Activity   Ask your health care provider about attending cardiac rehabilitation. These programs include aerobic physical activity, which provides many benefits for your heart.  If no cardiac rehabilitation program is available, ask your health care provider what aerobic exercises are safe for you to do. Lifestyle Make the lifestyle changes recommended by your health care provider. In general:  Lose weight if your health care provider  tells you to do that. Weight loss may reduce symptoms of heart failure.  Do not use any products that contain nicotine or tobacco, such as cigarettes or e-cigarettes. If you need help quitting, ask your health care provider.  Do not use street (illegal) drugs.  Return to your normal activities as told by your health care provider. Ask your health care provider what activities are safe for you. General instructions   Make sure you weigh yourself every day to track your weight. Rapid weight gain may indicate an increase in fluid in your body and may increase the workload of your heart. ? Weigh yourself every morning. Do this after you urinate but before you eat breakfast. ? Wear the same type of clothing, without shoes, each time you weigh yourself. ? Weigh yourself on the same scale and in the same spot each time.  Living with chronic heart failure often leads to emotions such as fear, stress, anxiety, and depression. If you feel any of these emotions and need help coping, contact your health care provider. Other ways to get help include: ? Talking to friends and family members about your condition. They can give you support and guidance. Explain your symptoms to them and, if comfortable, invite them to attend appointments or rehabilitation with you. ? Joining a support group for people with chronic heart failure. Talking with other people who have the same symptoms may give you new ways of coping with your disease and your emotions.  Stay up to date with your shots (vaccines). Staying current on pneumococcal and influenza vaccines is especially important in preventing germs from attacking your airways (respiratory infections).  Keep all follow-up visits as told by your health care provider. This is important. How to recognize changes in your condition You and your family members need to know what changes to watch for in your condition. Watch for the following changes and report them to your  health care provider:  Sudden weight gain. Ask your health care provider what amount of weight gain to report.  Shortness of breath: ? Feeling short of breath while at rest, with no exercise or activity that required great effort. ? Feeling breathless with activity.  Swelling of your lower legs or ankles.  Difficulty sleeping: ? You wake up feeling short of breath. ? You have to use more pillows to raise your head in order to sleep.  Frequent, dry, hacking cough.  Loss of appetite.  Feeling more tired all the time.  Depression or feelings of sadness or hopelessness.  Bloating in the stomach. Where to find more information  Local support groups. Ask your health care provider about groups near you.  The American Heart Association: www.heart.org Contact a health care provider if:  You have a rapid weight gain.  You have increasing shortness of breath that is unusual for you.  You are unable to participate in your usual physical activities.  You tire easily.  You cough more than normal, especially with physical activity.  You have any swelling or more swelling in areas such as your hands, feet, ankles, or abdomen.  You feel like your heart is beating quickly (palpitations).  You become dizzy or light-headed when you stand up. Get help right away if:  You have difficulty breathing.  You notice or your family notices a change in your awareness, such as having trouble staying awake or having difficulty with concentration.  You have pain or discomfort in your chest.  You have an episode of fainting (syncope). Summary  There is no cure for heart failure, so it is important for you to take good care of yourself and follow the treatment plan set by your health care provider.  Medicines are important in reducing your heart's workload, slowing the progression of heart failure, and improving your symptoms.  Living with chronic heart failure often leads to emotions such  as fear, stress, anxiety, and depression. If you are feeling any of these emotions and need help coping, contact your health care provider. This information is not intended to replace advice given to you by your health care provider. Make sure you discuss any questions you have with your health care provider. Document Released: 06/12/2016 Document Revised: 06/12/2016 Document Reviewed: 06/12/2016 Elsevier Interactive Patient Education  2019 [employer]vier [employer]   IMPORTANT INFORMATION: PAY CLOSE ATTENTION   PHYSICIAN DISCHARGE INSTRUCTIONS  Follow with Primary care provider  Lucia Gaskins, MD  and other consultants as instructed by your Hospitalist Physician  [place] IF SYMPTOMS COME BACK, WORSEN OR NEW PROBLEM DEVELOPS   Please note: You were cared for by a hospitalist during your hospital stay. Every effort will be made to forward records to your primary care provider.  You can request that your primary care provider send for your hospital records if they have not received them.  Once you are discharged, your primary care physician will handle any further medical issues. Please note that NO REFILLS for any discharge medications will be authorized once you are discharged, as it is imperative that you return to your primary care physician (or establish a relationship with a primary care physician if you do not have one) for your post hospital discharge needs so that they can reassess your need for medications and monitor your lab values.  Please get a complete blood count and chemistry panel checked by your Primary MD at your next visit, and again as instructed by your Primary MD.  Get Medicines reviewed and adjusted: Please take all your medications with you for your next visit with your Primary MD  Laboratory/radiological data: Please request your Primary MD to go over all hospital tests and procedure/radiological results at the follow up, please ask your  primary care provider to get all Hospital records sent to his/her office.  In some cases, they will be blood work, cultures and biopsy results pending at the time of your discharge. Please request that your primary care provider follow up on these results.  If you are diabetic, please bring your blood sugar readings with you to your follow up appointment with primary care.    Please call and make your follow up appointments as soon as possible.    Also Note the following: If you experience worsening of your admission symptoms, develop shortness of breath, life threatening emergency, suicidal or homicidal thoughts you must seek medical attention immediately by calling 911 or calling your MD immediately  if symptoms less severe.  You must read complete instructions/literature along  with all the possible adverse reactions/side effects for all the Medicines you take and that have been prescribed to you. Take any new Medicines after you have completely understood and accpet all the possible adverse reactions/side effects.   Do not drive when taking Pain medications or sleeping medications (Benzodiazepines)  Do not take more than prescribed Pain, Sleep and Anxiety Medications. It is not advisable to combine anxiety,sleep and pain medications without talking with your primary care practitioner  Special Instructions: If you have smoked or chewed Tobacco  in the last 2 yrs please stop smoking, stop any regular Alcohol  and or any Recreational drug use.  Wear Seat belts while driving.  Do not drive if taking any narcotic, mind altering or controlled substances or recreational drugs or alcohol.

## 2018-08-05 NOTE — TOC Transition Note (Signed)
Transition of Care [facility]) - CM/SW Discharge Note   Patient Details  Name: Marisa Bailey MRN: [id number] Date of Birth: July 29, 1940  Transition of Care [facility]) CM/SW Contact:  Alder Murri, Chauncey Reading, RN Phone Number: 08/05/2018, 11:49 AM   Clinical Narrative:   Patient discharging home today. No oxygen needs. No home health needs.       Barriers to Discharge: Continued Medical Work up   Readmission Risk Interventions Readmission Risk Prevention Plan 08/04/2018  Transportation Screening Complete  Some recent data might be hidden

## 2018-08-05 NOTE — Plan of Care (Signed)
Problem: Education: Goal: Knowledge of General Education information will improve Description: Including pain rating scale, medication(s)/side effects and non-pharmacologic comfort measures Outcome: Adequate for Discharge   Problem: Health Behavior/Discharge Planning: Goal: Ability to manage health-related needs will improve Outcome: Adequate for Discharge   Problem: Clinical Measurements: Goal: Ability to maintain clinical measurements within normal limits will improve Outcome: Adequate for Discharge Goal: Will remain free from infection Outcome: Adequate for Discharge Goal: Diagnostic test results will improve Outcome: Adequate for Discharge Goal: Respiratory complications will improve Outcome: Adequate for Discharge Goal: Cardiovascular complication will be avoided Outcome: Adequate for Discharge   Problem: Activity: Goal: Risk for activity intolerance will decrease Outcome: Adequate for Discharge   Problem: Nutrition: Goal: Adequate nutrition will be maintained Outcome: Adequate for Discharge   Problem: Coping: Goal: Level of anxiety will decrease Outcome: Adequate for Discharge   Problem: Elimination: Goal: Will not experience complications related to bowel motility Outcome: Adequate for Discharge Goal: Will not experience complications related to urinary retention Outcome: Adequate for Discharge   Problem: Pain Managment: Goal: General experience of comfort will improve Outcome: Adequate for Discharge   Problem: Safety: Goal: Ability to remain free from injury will improve Outcome: Adequate for Discharge   Problem: Skin Integrity: Goal: Risk for impaired skin integrity will decrease Outcome: Adequate for Discharge   Problem: Education: Goal: Ability to demonstrate management of disease process will improve Outcome: Adequate for Discharge Goal: Ability to verbalize understanding of medication therapies will improve Outcome: Adequate for Discharge Goal:  Individualized Educational Video(s) Outcome: Adequate for Discharge   Problem: Activity: Goal: Capacity to carry out activities will improve Outcome: Adequate for Discharge   Problem: Cardiac: Goal: Ability to achieve and maintain adequate cardiopulmonary perfusion will improve Outcome: Adequate for Discharge   Problem: Education: Goal: Knowledge of General Education information will improve Description: Including pain rating scale, medication(s)/side effects and non-pharmacologic comfort measures Outcome: Adequate for Discharge   Problem: Health Behavior/Discharge Planning: Goal: Ability to manage health-related needs will improve Outcome: Adequate for Discharge   Problem: Clinical Measurements: Goal: Ability to maintain clinical measurements within normal limits will improve Outcome: Adequate for Discharge Goal: Will remain free from infection Outcome: Adequate for Discharge Goal: Diagnostic test results will improve Outcome: Adequate for Discharge Goal: Respiratory complications will improve Outcome: Adequate for Discharge Goal: Cardiovascular complication will be avoided Outcome: Adequate for Discharge   Problem: Activity: Goal: Risk for activity intolerance will decrease Outcome: Adequate for Discharge   Problem: Nutrition: Goal: Adequate nutrition will be maintained Outcome: Adequate for Discharge   Problem: Coping: Goal: Level of anxiety will decrease Outcome: Adequate for Discharge   Problem: Elimination: Goal: Will not experience complications related to bowel motility Outcome: Adequate for Discharge Goal: Will not experience complications related to urinary retention Outcome: Adequate for Discharge   Problem: Pain Managment: Goal: General experience of comfort will improve Outcome: Adequate for Discharge   Problem: Safety: Goal: Ability to remain free from injury will improve Outcome: Adequate for Discharge   Problem: Skin Integrity: Goal: Risk for  impaired skin integrity will decrease Outcome: Adequate for Discharge   Problem: Education: Goal: Ability to demonstrate management of disease process will improve Outcome: Adequate for Discharge Goal: Ability to verbalize understanding of medication therapies will improve Outcome: Adequate for Discharge Goal: Individualized Educational Video(s) Outcome: Adequate for Discharge   Problem: Activity: Goal: Capacity to carry out activities will improve Outcome: Adequate for Discharge   Problem: Cardiac: Goal: Ability to achieve and maintain adequate cardiopulmonary perfusion will improve  Outcome: Adequate for Discharge

## 2018-08-05 NOTE — Progress Notes (Signed)
Pt removed telemetry-pending d/c orders.

## 2019-01-10 ENCOUNTER — Emergency Department (HOSPITAL_COMMUNITY): Payer: Medicare Other

## 2019-01-10 ENCOUNTER — Encounter (HOSPITAL_COMMUNITY): Payer: Self-pay

## 2019-01-10 ENCOUNTER — Inpatient Hospital Stay (HOSPITAL_COMMUNITY)
Admission: EM | Admit: 2019-01-10 | Discharge: 2019-01-14 | DRG: 291 | Disposition: A | Discharge status: Home / Self Care | Payer: Medicare Other | Attending: Family Medicine | Admitting: Family Medicine

## 2019-01-10 ENCOUNTER — Other Ambulatory Visit: Payer: Self-pay

## 2019-01-10 DIAGNOSIS — J9601 Acute respiratory failure with hypoxia: Secondary | ICD-10-CM | POA: Diagnosis present

## 2019-01-10 DIAGNOSIS — I110 Hypertensive heart disease with heart failure: Secondary | ICD-10-CM | POA: Diagnosis not present

## 2019-01-10 DIAGNOSIS — Z8249 Family history of ischemic heart disease and other diseases of the circulatory system: Secondary | ICD-10-CM

## 2019-01-10 DIAGNOSIS — J471 Bronchiectasis with (acute) exacerbation: Secondary | ICD-10-CM | POA: Diagnosis present

## 2019-01-10 DIAGNOSIS — I5033 Acute on chronic diastolic (congestive) heart failure: Secondary | ICD-10-CM | POA: Diagnosis present

## 2019-01-10 DIAGNOSIS — Z20828 Contact with and (suspected) exposure to other viral communicable diseases: Secondary | ICD-10-CM | POA: Diagnosis present

## 2019-01-10 DIAGNOSIS — I10 Essential (primary) hypertension: Secondary | ICD-10-CM | POA: Diagnosis present

## 2019-01-10 DIAGNOSIS — R0600 Dyspnea, unspecified: Secondary | ICD-10-CM

## 2019-01-10 DIAGNOSIS — J9611 Chronic respiratory failure with hypoxia: Secondary | ICD-10-CM | POA: Diagnosis present

## 2019-01-10 DIAGNOSIS — Z79899 Other long term (current) drug therapy: Secondary | ICD-10-CM

## 2019-01-10 DIAGNOSIS — J9691 Respiratory failure, unspecified with hypoxia: Secondary | ICD-10-CM | POA: Diagnosis present

## 2019-01-10 DIAGNOSIS — J449 Chronic obstructive pulmonary disease, unspecified: Secondary | ICD-10-CM | POA: Diagnosis present

## 2019-01-10 DIAGNOSIS — J9600 Acute respiratory failure, unspecified whether with hypoxia or hypercapnia: Secondary | ICD-10-CM | POA: Insufficient documentation

## 2019-01-10 DIAGNOSIS — H9190 Unspecified hearing loss, unspecified ear: Secondary | ICD-10-CM | POA: Diagnosis present

## 2019-01-10 DIAGNOSIS — Z7982 Long term (current) use of aspirin: Secondary | ICD-10-CM

## 2019-01-10 DIAGNOSIS — Z880 Allergy status to penicillin: Secondary | ICD-10-CM

## 2019-01-10 DIAGNOSIS — F17210 Nicotine dependence, cigarettes, uncomplicated: Secondary | ICD-10-CM | POA: Diagnosis present

## 2019-01-10 DIAGNOSIS — J40 Bronchitis, not specified as acute or chronic: Secondary | ICD-10-CM | POA: Diagnosis present

## 2019-01-10 DIAGNOSIS — Z720 Tobacco use: Secondary | ICD-10-CM | POA: Diagnosis present

## 2019-01-10 LAB — CBC WITH DIFFERENTIAL/PLATELET
Abs Immature Granulocytes: 0.05 10*3/uL (ref 0.00–0.07)
Basophils Absolute: 0.1 10*3/uL (ref 0.0–0.1)
Basophils Relative: 1 %
Eosinophils Absolute: 0.4 10*3/uL (ref 0.0–0.5)
Eosinophils Relative: 5 %
HCT: 45.8 % (ref 36.0–46.0)
Hemoglobin: 13.9 g/dL (ref 12.0–15.0)
Immature Granulocytes: 1 %
Lymphocytes Relative: 13 %
Lymphs Abs: 1 10*3/uL (ref 0.7–4.0)
MCH: 29.3 pg (ref 26.0–34.0)
MCHC: 30.3 g/dL (ref 30.0–36.0)
MCV: 96.6 fL (ref 80.0–100.0)
Monocytes Absolute: 0.3 10*3/uL (ref 0.1–1.0)
Monocytes Relative: 4 %
Neutro Abs: 5.7 10*3/uL (ref 1.7–7.7)
Neutrophils Relative %: 76 %
Platelets: 271 10*3/uL (ref 150–400)
RBC: 4.74 MIL/uL (ref 3.87–5.11)
RDW: 13.3 % (ref 11.5–15.5)
WBC: 7.4 10*3/uL (ref 4.0–10.5)
nRBC: 0 % (ref 0.0–0.2)

## 2019-01-10 LAB — BASIC METABOLIC PANEL
Anion gap: 10 (ref 5–15)
BUN: 11 mg/dL (ref 8–23)
CO2: 27 mmol/L (ref 22–32)
Calcium: 8.4 mg/dL — ABNORMAL LOW (ref 8.9–10.3)
Chloride: 101 mmol/L (ref 98–111)
Creatinine, Ser: 0.71 mg/dL (ref 0.44–1.00)
GFR calc Af Amer: >60 mL/min (ref >60)
GFR calc non Af Amer: >60 mL/min (ref >60)
Glucose, Bld: 119 mg/dL — ABNORMAL HIGH (ref 70–99)
Potassium: 3.7 mmol/L (ref 3.5–5.1)
Sodium: 138 mmol/L (ref 135–145)

## 2019-01-10 LAB — BRAIN NATRIURETIC PEPTIDE: B Natriuretic Peptide: 297 pg/mL — ABNORMAL HIGH (ref 0.0–100.0)

## 2019-01-10 LAB — POC SARS CORONAVIRUS 2 AG -  ED: SARS Coronavirus 2 Ag: NEGATIVE

## 2019-01-10 LAB — LACTIC ACID, PLASMA: Lactic Acid, Venous: 1.1 mmol/L (ref 0.5–1.9)

## 2019-01-10 LAB — TROPONIN I (HIGH SENSITIVITY)
Troponin I (High Sensitivity): 5 ng/L (ref <18)
Troponin I (High Sensitivity): 6 ng/L (ref <18)

## 2019-01-10 MED ORDER — IPRATROPIUM-ALBUTEROL 0.5-2.5 (3) MG/3ML IN SOLN
3.0000 mL | Freq: Four times a day (QID) | RESPIRATORY_TRACT | Status: DC
  Administered 2019-01-11 (×3): 3 mL via RESPIRATORY_TRACT
  Filled 2019-01-10 (×4): qty 3

## 2019-01-10 MED ORDER — ONDANSETRON HCL 4 MG PO TABS: 4.0000 mg | ORAL_TABLET | Freq: Four times a day (QID) | ORAL | Status: DC | PRN

## 2019-01-10 MED ORDER — METHYLPREDNISOLONE SODIUM SUCC 125 MG IJ SOLR
125.0000 mg | Freq: Once | INTRAMUSCULAR | Status: AC
  Administered 2019-01-10: 125 mg via INTRAVENOUS
  Filled 2019-01-10: qty 2

## 2019-01-10 MED ORDER — METHYLPREDNISOLONE SODIUM SUCC 125 MG IJ SOLR
60.0000 mg | Freq: Two times a day (BID) | INTRAMUSCULAR | Status: DC
  Administered 2019-01-11 – 2019-01-13 (×6): 60 mg via INTRAVENOUS
  Filled 2019-01-10 (×6): qty 2

## 2019-01-10 MED ORDER — ASPIRIN EC 81 MG PO TBEC
81.0000 mg | DELAYED_RELEASE_TABLET | Freq: Every day | ORAL | Status: DC
  Administered 2019-01-12 – 2019-01-13 (×3): 81 mg via ORAL
  Filled 2019-01-10 (×3): qty 1

## 2019-01-10 MED ORDER — IPRATROPIUM-ALBUTEROL 0.5-2.5 (3) MG/3ML IN SOLN: 3.0000 mL | RESPIRATORY_TRACT | Status: DC | PRN

## 2019-01-10 MED ORDER — POTASSIUM CHLORIDE CRYS ER 20 MEQ PO TBCR
60.0000 meq | EXTENDED_RELEASE_TABLET | Freq: Once | ORAL | Status: AC
  Administered 2019-01-10: 60 meq via ORAL
  Filled 2019-01-10: qty 3

## 2019-01-10 MED ORDER — ACETAMINOPHEN 650 MG RE SUPP: 650.0000 mg | Freq: Four times a day (QID) | RECTAL | Status: DC | PRN

## 2019-01-10 MED ORDER — FUROSEMIDE 10 MG/ML IJ SOLN
80.0000 mg | Freq: Once | INTRAMUSCULAR | Status: AC
  Administered 2019-01-10: 80 mg via INTRAVENOUS
  Filled 2019-01-10: qty 8

## 2019-01-10 MED ORDER — AMLODIPINE BESYLATE 5 MG PO TABS
10.0000 mg | ORAL_TABLET | Freq: Every day | ORAL | Status: DC
  Administered 2019-01-11 – 2019-01-14 (×4): 10 mg via ORAL
  Filled 2019-01-10 (×4): qty 2

## 2019-01-10 MED ORDER — ENOXAPARIN SODIUM 40 MG/0.4ML ~~LOC~~ SOLN
40.0000 mg | SUBCUTANEOUS | Status: DC
  Administered 2019-01-10 – 2019-01-13 (×4): 40 mg via SUBCUTANEOUS
  Filled 2019-01-10 (×5): qty 0.4

## 2019-01-10 MED ORDER — AZITHROMYCIN 250 MG PO TABS
500.0000 mg | ORAL_TABLET | Freq: Every day | ORAL | Status: DC
  Administered 2019-01-11 – 2019-01-14 (×4): 500 mg via ORAL
  Filled 2019-01-10 (×5): qty 2

## 2019-01-10 MED ORDER — POLYETHYLENE GLYCOL 3350 17 G PO PACK: 17.0000 g | PACK | Freq: Every day | ORAL | Status: DC | PRN

## 2019-01-10 MED ORDER — ALBUTEROL SULFATE HFA 108 (90 BASE) MCG/ACT IN AERS
INHALATION_SPRAY | RESPIRATORY_TRACT | Status: AC
  Administered 2019-01-10: 6
  Filled 2019-01-10: qty 6.7

## 2019-01-10 MED ORDER — ONDANSETRON HCL 4 MG/2ML IJ SOLN: 4.0000 mg | Freq: Four times a day (QID) | INTRAMUSCULAR | Status: DC | PRN

## 2019-01-10 MED ORDER — ACETAMINOPHEN 325 MG PO TABS: 650.0000 mg | ORAL_TABLET | Freq: Four times a day (QID) | ORAL | Status: DC | PRN

## 2019-01-10 MED ORDER — AZITHROMYCIN 250 MG PO TABS
500.0000 mg | ORAL_TABLET | Freq: Once | ORAL | Status: AC
  Administered 2019-01-10: 500 mg via ORAL
  Filled 2019-01-10: qty 2

## 2019-01-10 MED ORDER — SODIUM CHLORIDE 0.9 % IV SOLN
1.0000 g | Freq: Once | INTRAVENOUS | Status: AC
  Administered 2019-01-10: 1 g via INTRAVENOUS
  Filled 2019-01-10: qty 10

## 2019-01-10 MED ORDER — IOHEXOL 350 MG/ML SOLN
100.0000 mL | Freq: Once | INTRAVENOUS | Status: AC | PRN
  Administered 2019-01-10: 100 mL via INTRAVENOUS

## 2019-01-10 MED ORDER — GUAIFENESIN-DM 100-10 MG/5ML PO SYRP: 5.0000 mL | ORAL_SOLUTION | ORAL | Status: DC | PRN

## 2019-01-10 NOTE — H&P (Signed)
History and Physical    Marisa Bailey QQV:[id number] DOB: Jul 04, 1940 DOA: 01/10/2019  PCP: Lucia Gaskins, MD   Patient coming from: Home  I have personally briefly reviewed patient's old medical records in [place]  Chief Complaint: Shortness of breath  HPI: Marisa Bailey is a 78 y.o. female with medical history significant for COPD, CHF, hypertension.  Presented to the ED with complaints of sudden onset of difficulty breathing that started this morning.  Patient is a poor historian and hard of hearing.  She reports cough, productive of whitish sputum.  Denies fever or chills.  No chest pain.  She does not check her weight daily.  Reports her abdomen looks full, but likely this is normal for her.  She does not think she has added weight.,  Though she does not check her weights daily.  Denies leg swelling.  She is not on home O2.  ED Course: O2 sats 57% on room air, patient was placed on nonrebreather at 15 L.  Later weaned down to 5 L O2 with sats maintaining > 97%.  BNP 297.  HST 5 > 6.  WBC 7.4.  Covid test negative.  Normal lactic acid 1.1.  Portable chest x-ray showed patchy bibasilar airspace disease.  Subsequent CTA chest-mild bilateral lower lobe bronchiectasis and subpleural reticular opacities suggestive of scarring, bilateral smooth interlobular septal thickening which may represent mild component of edema. IV Lasix 80 mg x 1 given.  Solu-Medrol, bronchodilators given.  Hospitalist to admit for acute respiratory failure.  Review of Systems: As per HPI all other systems reviewed and negative.  Past Medical History:  Diagnosis Date   Bronchitis    COPD (chronic obstructive pulmonary disease) ([place])    Hypertension     Past Surgical History:  Procedure Laterality Date   ENUCLEATION       reports that she has been smoking cigarettes. She has been smoking about 0.50 packs per day. She has never used smokeless tobacco. She reports that she does not drink alcohol or  use drugs.  Allergies  Allergen Reactions   Penicillins Rash    Has patient had a PCN reaction causing immediate rash, facial/tongue/throat swelling, SOB or lightheadedness with hypotension: {Yes/No:[id number] Has patient had a PCN reaction causing severe rash involving mucus membranes or skin necrosis: Yes Has patient had a PCN reaction that required hospitalization No Has patient had a PCN reaction occurring within the last 10 years: No If all of the above answers are "NO", then may proceed with Cephalosporin use.  **Has tolerated keflex    Family history of hypertension.  Prior to Admission medications   Medication Sig Start Date End Date Taking? Authorizing Provider  amLODipine (NORVASC) 10 MG tablet Take 10 mg by mouth daily.   Yes [provider]  aspirin EC 81 MG tablet Take 81 mg by mouth at bedtime.    Yes [provider]  folic acid (FOLVITE) 1 MG tablet Take 1 mg by mouth daily.  06/11/17  Yes [provider]  furosemide (LASIX) 20 MG tablet Take 20 mg by mouth daily. 10/13/18  Yes [provider]  olmesartan (BENICAR) 40 MG tablet Take 40 mg by mouth daily.   Yes [provider]  PROAIR HFA 108 (90 Base) MCG/ACT inhaler Inhale 1-2 puffs into the lungs every 4 (four) hours as needed for wheezing.  05/28/17  Yes [provider]    Physical Exam: Vitals:   01/10/19 1700 01/10/19 1715 01/10/19 1730 01/10/19 1745  BP:      Pulse: 78 80 73 75  Resp:      Temp:      TempSrc:      SpO2: 99% 100% 99% 100%  Weight:      Height:        Constitutional: NAD, calm, comfortable Vitals:   01/10/19 1700 01/10/19 1715 01/10/19 1730 01/10/19 1745  BP:      Pulse: 78 80 73 75  Resp:      Temp:      TempSrc:      SpO2: 99% 100% 99% 100%  Weight:      Height:       Eyes: Right pupil dilated irregular in appearance and unreactive to light, patient states this is from a trauma to her eye, decades ago, chronic right eye ptosis,  PERRL, conjunctivae normal ENMT: Mucous membranes are moist. Posterior pharynx clear of any exudate or lesions.Normal dentition.  Neck: normal, supple, no masses, no thyromegaly Respiratory: Diffuse expiratory rhonchi, no crackles. Normal respiratory effort. No accessory muscle use.  Cardiovascular: Regular rate and rhythm, no murmurs / rubs / gallops. No extremity edema. 2+ pedal pulses.   Abdomen: Full, no tenderness, no masses palpated. No hepatosplenomegaly. Bowel sounds positive.  Musculoskeletal: no clubbing / cyanosis. No joint deformity upper and lower extremities. Good ROM, no contractures. Normal muscle tone.  Skin: no rashes, lesions, ulcers. No induration Neurologic: CN 2-12 grossly intact. Sensation intact, DTR normal. Strength 5/5 in all 4.  Psychiatric: Normal judgment and insight. Alert and oriented x 3. Normal mood.   Labs on Admission: I have personally reviewed following labs and imaging studies  CBC: Recent Labs  Lab 01/10/19 1224  WBC 7.4  NEUTROABS 5.7  HGB 13.9  HCT 45.8  MCV 96.6  PLT 245   Basic Metabolic Panel: Recent Labs  Lab 01/10/19 1224  NA 138  K 3.7  CL 101  CO2 27  GLUCOSE 119*  BUN 11  CREATININE 0.71  CALCIUM 8.4*    Radiological Exams on Admission: Ct Angio Chest Pe W And/or Wo Contrast  Result Date: 01/10/2019 CLINICAL DATA:  Worsening shortness of breath. Evaluate for pulmonary embolus. EXAM: CT ANGIOGRAPHY CHEST WITH CONTRAST TECHNIQUE: Multidetector CT imaging of the chest was performed using the standard protocol during bolus administration of intravenous contrast. Multiplanar CT image reconstructions and MIPs were obtained to evaluate the vascular anatomy. CONTRAST:  118m OMNIPAQUE IOHEXOL 350 MG/ML SOLN COMPARISON:  P chest 01/24/2018 FINDINGS: Cardiovascular: Heart is mildly enlarged. Trace fluid superior pericardial recess. Thoracic aortic and coronary arterial vascular calcifications. Adequate opacification of the pulmonary  arterial system. Motion artifact limits evaluation. No definite intraluminal filling defect identified to suggest acute pulmonary embolus. Mediastinum/Nodes: No enlarged axillary, mediastinal or hilar lymphadenopathy. Normal appearance of the esophagus. Lungs/Pleura: Central airways are patent. Dependent atelectasis within the bilateral lower lobes. Smooth interlobular septal thickening. Trace effusions bilaterally. Bilateral lower lobe bronchiectasis. No pneumothorax. Upper Abdomen: No acute process. Musculoskeletal: Thoracic spine degenerative changes. No aggressive or acute appearing osseous lesions. Review of the MIP images confirms the above findings. IMPRESSION: No evidence for acute pulmonary embolus. Bilateral lower lobe bronchiectasis and subpleural reticular opacities suggestive of scarring. Bilateral smooth interlobular septal thickening which may represent mild component of edema. Aortic Atherosclerosis (ICD10-I70.0). Electronically Signed   By: DLovey NewcomerM.D.   On: 01/10/2019 16:00   Dg Chest Port 1 View  Result Date: 01/10/2019 CLINICAL DATA:  Shortness of breath with decreased oxygen saturation EXAM: PORTABLE CHEST  1 VIEW COMPARISON:  August 01, 2018 FINDINGS: There is airspace opacity in both lung bases with atelectatic change in each mid lung. Upper lung regions elsewhere clear. There is cardiomegaly with pulmonary vascularity within normal limits. No adenopathy. No bone lesions. IMPRESSION: Patchy bibasilar airspace disease with midlung atelectatic change. Stable cardiomegaly. No adenopathy. Followup PA and lateral chest radiographs is recommended in 3-4 weeks following trial of antibiotic therapy to ensure resolution and exclude underlying malignancy. Electronically Signed   By: Lowella Grip III M.D.   On: 01/10/2019 12:41    EKG: Independently reviewed.  Sinus rhythm, QTC 448.  No significant change from prior.  Assessment/Plan Active Problems:   Acute respiratory failure (HCC)     Acute hypoxic respiratory failure-dyspnea with cough, O2 sats down to 57% on room air.  Not on home O2.  History of COPD.  No chest pain.  CTA negative for PE, shows bilateral lower lobe bronchiectasis. Covid test negative.  BNP improved compared to prior at 297, no marked peripheral sign of volume overload.  - Currently on 5 L with sats greater than 97%, with normal mental status, will hold off on ABG - Supplemental O2  COPD exacerbation-diffuse rhonchi on exam.  CTA chest shows bilateral lower lobe bronchiectasis.  WBC 7.4. -125 mg Solu-Medrol x1 given in ED, continue 60 mg twice daily -DuoNeb scheduled as needed -Mucolytics as needed -Continue IV azithromycin 560m PO for COPD exacerbation and bronchiectasis.  Diastolic CHF-appears stable and compensated.  No obvious sign of peripheral volume overload, BNP 297 improved compared to prior.  CTA chest shows bilateral smooth interlobular septal thickening which may represent mild component of edema. -80 mg IV Lasix x1 given in ED, for now will hold off on further Lasix dosing. -Hold home Lasix 20 mg daily -Intake and output  Hypertension-elevated. -Resume home Norvasc. -Hold Lasix and losartan for now with contrast exposure.   DVT prophylaxis: Lovenox Code Status: Full code Family Communication: None at bedside Disposition Plan: Per Rounding team Consults called: None Admission status: Ob, telemetry  EBethena RoysMD [facility]  01/10/2019, 6:39 PM

## 2019-01-10 NOTE — ED Triage Notes (Addendum)
Pt presents to ED with increased SOB started this am. Pt denies fever, cough. Pt O2 sat initally 57% placed on Nonrebreather at 15L

## 2019-01-10 NOTE — ED Notes (Signed)
Respiratory at bedside.

## 2019-01-10 NOTE — ED Provider Notes (Signed)
[facility] EMERGENCY DEPARTMENT Provider Note   CSN: [id number] Arrival date & time: 01/10/19  1144     History   Chief Complaint Chief Complaint  Patient presents with  . Shortness of Breath    HPI Marisa Bailey is a 78 y.o. female.     Pt presents to the ED today with sob.  The pt said SOB started this morning.  She denies f/c.  She is a little confused and is a poor historian.  She does not know if she has underlying lung problems.  Per epic, she has a hx of COPD and CHF.  Her initial O2 sat is 57% on RA.  She does not normally wear oxygen.        Past Medical History:  Diagnosis Date  . Bronchitis   . COPD (chronic obstructive pulmonary disease) ([place])   . Hypertension     Patient Active Problem List   Diagnosis Date Noted  . Respiratory failure with hypoxia ([place]) 01/11/2019  . Acute respiratory failure ([place]) 01/10/2019  . Acute respiratory failure with hypoxia and hypercapnia ([place]) 08/01/2018  . Acute lower UTI 08/01/2018  . Hypoxia   . Leg swelling   . Obesity (BMI 30-39.9) 01/24/2018  . Acute respiratory failure with hypoxia ([place]) 06/27/2017  . COPD (chronic obstructive pulmonary disease) ([place]) 06/27/2017  . Acute exacerbation of CHF (congestive heart failure) ([place]) 06/27/2017  . Tobacco abuse 06/27/2017  . Hypertension   . Muscle weakness (generalized) 08/17/2012  . Pain in joint, shoulder region 08/17/2012  . Fx upper humerus-closed 08/17/2012    Past Surgical History:  Procedure Laterality Date  . ENUCLEATION       OB History   No obstetric history on file.      Home Medications    Prior to Admission medications   Medication Sig Start Date End Date Taking? Authorizing Provider  amLODipine (NORVASC) 10 MG tablet Take 10 mg by mouth daily.   Yes [provider]  aspirin EC 81 MG tablet Take 81 mg by mouth at bedtime.    Yes [provider]  folic acid (FOLVITE) 1 MG tablet Take 1 mg by mouth daily.  06/11/17  Yes [provider]  furosemide (LASIX) 20 MG tablet Take 20 mg by mouth daily. 10/13/18  Yes [provider]  olmesartan (BENICAR) 40 MG tablet Take 40 mg by mouth daily.   Yes [provider]  PROAIR HFA 108 (90 Base) MCG/ACT inhaler Inhale 1-2 puffs into the lungs every 4 (four) hours as needed for wheezing.  05/28/17  Yes [provider]    Family History No family history on file.  Social History Social History   Tobacco Use  . Smoking status: Current Every Day Smoker    Packs/day: 0.50    Types: Cigarettes  . Smokeless tobacco: Never Used  Substance Use Topics  . Alcohol use: No    Comment: rarely  . Drug use: No     Allergies   Penicillins   Review of Systems Review of Systems  Respiratory: Positive for shortness of breath.   All other systems reviewed and are negative.    Physical Exam Updated Vital Signs BP (!) 143/65 (BP Location: Right Arm)   Pulse 73   Temp 98.6 F (37 C) (Oral)   Resp 20   Ht _0  (1.6 m)   Wt 81.5 kg   SpO2 97%   BMI 31.83 kg/m   Physical Exam Vitals signs and nursing  note reviewed.  Constitutional:      General: She is in acute distress.  HENT:     Head: Normocephalic and atraumatic.     Mouth/Throat:     Mouth: Mucous membranes are moist.     Pharynx: Oropharynx is clear.  Eyes:     Extraocular Movements: Extraocular movements intact.     Pupils: Pupils are equal, round, and reactive to light.  Neck:     Musculoskeletal: Normal range of motion and neck supple.  Cardiovascular:     Rate and Rhythm: Normal rate and regular rhythm.  Pulmonary:     Effort: Tachypnea present.     Breath sounds: Wheezing and rhonchi present.  Abdominal:     General: Bowel sounds are normal.     Palpations: Abdomen is soft.  Musculoskeletal: Normal range of motion.     Right lower leg: Edema present.     Left lower leg: Edema present.  Skin:    General: Skin is warm.     Capillary Refill: Capillary refill takes  less than 2 seconds.  Neurological:     General: No focal deficit present.     Mental Status: She is oriented to person, place, and time.  Psychiatric:        Mood and Affect: Mood normal.        Behavior: Behavior normal.      ED Treatments / Results  Labs (all labs ordered are listed, but only abnormal results are displayed) Labs Reviewed  BASIC METABOLIC PANEL - Abnormal; Notable for the following components:      Result Value   Glucose, Bld 119 (*)    Calcium 8.4 (*)    All other components within normal limits  BRAIN NATRIURETIC PEPTIDE - Abnormal; Notable for the following components:   B Natriuretic Peptide 297.0 (*)    All other components within normal limits  BASIC METABOLIC PANEL - Abnormal; Notable for the following components:   Glucose, Bld 127 (*)    Calcium 8.3 (*)    All other components within normal limits  CULTURE, BLOOD (ROUTINE X 2)  CULTURE, BLOOD (ROUTINE X 2)  SARS CORONAVIRUS 2 (TAT 6-24 HRS)  LACTIC ACID, PLASMA  CBC WITH DIFFERENTIAL/PLATELET  POC SARS CORONAVIRUS 2 AG -  ED  TROPONIN I (HIGH SENSITIVITY)  TROPONIN I (HIGH SENSITIVITY)    EKG EKG Interpretation  Date/Time:  Sunday January 10 2019 12:00:37 EST Ventricular Rate:  84 PR Interval:    QRS Duration: 80 QT Interval:  379 QTC Calculation: 448 R Axis:   77 Text Interpretation: Sinus rhythm Prolonged PR interval Left atrial enlargement Anterolateral infarct, old No significant change since last tracing Confirmed by Isla Pence [phone]) on 01/10/2019 12:41:38 PM   Radiology Ct Angio Chest Pe W And/or Wo Contrast  Result Date: 01/10/2019 CLINICAL DATA:  Worsening shortness of breath. Evaluate for pulmonary embolus. EXAM: CT ANGIOGRAPHY CHEST WITH CONTRAST TECHNIQUE: Multidetector CT imaging of the chest was performed using the standard protocol during bolus administration of intravenous contrast. Multiplanar CT image reconstructions and MIPs were obtained to evaluate the  vascular anatomy. CONTRAST:  152m OMNIPAQUE IOHEXOL 350 MG/ML SOLN COMPARISON:  P chest 01/24/2018 FINDINGS: Cardiovascular: Heart is mildly enlarged. Trace fluid superior pericardial recess. Thoracic aortic and coronary arterial vascular calcifications. Adequate opacification of the pulmonary arterial system. Motion artifact limits evaluation. No definite intraluminal filling defect identified to suggest acute pulmonary embolus. Mediastinum/Nodes: No enlarged axillary, mediastinal or hilar lymphadenopathy. Normal appearance of the esophagus. Lungs/Pleura:  Central airways are patent. Dependent atelectasis within the bilateral lower lobes. Smooth interlobular septal thickening. Trace effusions bilaterally. Bilateral lower lobe bronchiectasis. No pneumothorax. Upper Abdomen: No acute process. Musculoskeletal: Thoracic spine degenerative changes. No aggressive or acute appearing osseous lesions. Review of the MIP images confirms the above findings. IMPRESSION: No evidence for acute pulmonary embolus. Bilateral lower lobe bronchiectasis and subpleural reticular opacities suggestive of scarring. Bilateral smooth interlobular septal thickening which may represent mild component of edema. Aortic Atherosclerosis (ICD10-I70.0). Electronically Signed   By: Lovey Newcomer M.D.   On: 01/10/2019 16:00   Dg Chest Port 1 View  Result Date: 01/10/2019 CLINICAL DATA:  Shortness of breath with decreased oxygen saturation EXAM: PORTABLE CHEST 1 VIEW COMPARISON:  August 01, 2018 FINDINGS: There is airspace opacity in both lung bases with atelectatic change in each mid lung. Upper lung regions elsewhere clear. There is cardiomegaly with pulmonary vascularity within normal limits. No adenopathy. No bone lesions. IMPRESSION: Patchy bibasilar airspace disease with midlung atelectatic change. Stable cardiomegaly. No adenopathy. Followup PA and lateral chest radiographs is recommended in 3-4 weeks following trial of antibiotic therapy to  ensure resolution and exclude underlying malignancy. Electronically Signed   By: Lowella Grip III M.D.   On: 01/10/2019 12:41    Procedures Procedures (including critical care time)  Medications Ordered in ED Medications  methylPREDNISolone sodium succinate (SOLU-MEDROL) 125 mg/2 mL injection 60 mg (60 mg Intravenous Given 01/11/19 0847)  amLODipine (NORVASC) tablet 10 mg (10 mg Oral Given 01/11/19 0918)  aspirin EC tablet 81 mg (has no administration in time range)  ipratropium-albuterol (DUONEB) 0.5-2.5 (3) MG/3ML nebulizer solution 3 mL (has no administration in time range)  enoxaparin (LOVENOX) injection 40 mg (40 mg Subcutaneous Given 01/11/19 1749)  acetaminophen (TYLENOL) tablet 650 mg (has no administration in time range)    Or  acetaminophen (TYLENOL) suppository 650 mg (has no administration in time range)  polyethylene glycol (MIRALAX / GLYCOLAX) packet 17 g (has no administration in time range)  ondansetron (ZOFRAN) tablet 4 mg (has no administration in time range)    Or  ondansetron (ZOFRAN) injection 4 mg (has no administration in time range)  guaiFENesin-dextromethorphan (ROBITUSSIN DM) 100-10 MG/5ML syrup 5 mL (has no administration in time range)  azithromycin (ZITHROMAX) tablet 500 mg (500 mg Oral Given 01/11/19 1232)  guaiFENesin (MUCINEX) 12 hr tablet 600 mg (has no administration in time range)  ipratropium-albuterol (DUONEB) 0.5-2.5 (3) MG/3ML nebulizer solution 3 mL (has no administration in time range)  albuterol (VENTOLIN HFA) 108 (90 Base) MCG/ACT inhaler (6 puffs  Given 01/10/19 1207)  methylPREDNISolone sodium succinate (SOLU-MEDROL) 125 mg/2 mL injection 125 mg (125 mg Intravenous Given 01/10/19 1220)  cefTRIAXone (ROCEPHIN) 1 g in sodium chloride 0.9 % 100 mL IVPB (0 g Intravenous Stopped 01/10/19 1606)  azithromycin (ZITHROMAX) tablet 500 mg (500 mg Oral Given 01/10/19 1327)  iohexol (OMNIPAQUE) 350 MG/ML injection 100 mL (100 mLs Intravenous Contrast  Given 01/10/19 1535)  furosemide (LASIX) injection 80 mg (80 mg Intravenous Given 01/10/19 1614)  potassium chloride SA (KLOR-CON) CR tablet 60 mEq (60 mEq Oral Given 01/10/19 1610)     Initial Impression / Assessment and Plan / ED Course  I have reviewed the triage vital signs and the nursing notes.  Pertinent labs & imaging results that were available during my care of the patient were reviewed by me and considered in my medical decision making (see chart for details).      Pt placed on a NRB and O2  sats increased to 100.  RT gave pt albuterol and put her on a high flow O2 [place].  Initial CXR showed bibasilar pna, so she was given rocephin and zithromax.  The POC covid test was negative, so the 6 hr test was sent.  CTA to r/o PE is pending at shift change.  Pt signed out to Dr. Wilson Singer.  Scott LINNET BOTTARI was evaluated in Emergency Department on 01/11/2019 for the symptoms described in the history of present illness. She was evaluated in the context of the global COVID-19 pandemic, which necessitated consideration that the patient might be at risk for infection with the SARS-CoV-2 virus that causes COVID-19. Institutional protocols and algorithms that pertain to the evaluation of patients at risk for COVID-19 are in a state of rapid change based on information released by regulatory bodies including the CDC and federal and state organizations. These policies and algorithms were followed during the patient's care in the ED.  CRITICAL CARE Performed by: Isla Pence   Total critical care time: 30 minutes  Critical care time was exclusive of separately billable procedures and treating other patients.  Critical care was necessary to treat or prevent imminent or life-threatening deterioration.  Critical care was time spent personally by me on the following activities: development of treatment plan with patient and/or surrogate as well as nursing, discussions with consultants, evaluation of  patient's response to treatment, examination of patient, obtaining history from patient or surrogate, ordering and performing treatments and interventions, ordering and review of laboratory studies, ordering and review of radiographic studies, pulse oximetry and re-evaluation of patient's condition.  Final Clinical Impressions(s) / ED Diagnoses   Final diagnoses:  Acute respiratory failure with hypoxia [facility])    ED Discharge Orders    None       Isla Pence, MD 01/11/19 2210

## 2019-01-10 NOTE — ED Notes (Signed)
Respiratory notified.

## 2019-01-11 DIAGNOSIS — J9611 Chronic respiratory failure with hypoxia: Secondary | ICD-10-CM | POA: Diagnosis present

## 2019-01-11 DIAGNOSIS — Z7982 Long term (current) use of aspirin: Secondary | ICD-10-CM | POA: Diagnosis not present

## 2019-01-11 DIAGNOSIS — Z79899 Other long term (current) drug therapy: Secondary | ICD-10-CM | POA: Diagnosis not present

## 2019-01-11 DIAGNOSIS — H9190 Unspecified hearing loss, unspecified ear: Secondary | ICD-10-CM | POA: Diagnosis present

## 2019-01-11 DIAGNOSIS — Z20828 Contact with and (suspected) exposure to other viral communicable diseases: Secondary | ICD-10-CM | POA: Diagnosis present

## 2019-01-11 DIAGNOSIS — F17210 Nicotine dependence, cigarettes, uncomplicated: Secondary | ICD-10-CM | POA: Diagnosis present

## 2019-01-11 DIAGNOSIS — J471 Bronchiectasis with (acute) exacerbation: Secondary | ICD-10-CM | POA: Diagnosis present

## 2019-01-11 DIAGNOSIS — I10 Essential (primary) hypertension: Secondary | ICD-10-CM | POA: Diagnosis not present

## 2019-01-11 DIAGNOSIS — J9601 Acute respiratory failure with hypoxia: Secondary | ICD-10-CM | POA: Diagnosis present

## 2019-01-11 DIAGNOSIS — J9691 Respiratory failure, unspecified with hypoxia: Secondary | ICD-10-CM | POA: Diagnosis present

## 2019-01-11 DIAGNOSIS — J449 Chronic obstructive pulmonary disease, unspecified: Secondary | ICD-10-CM | POA: Diagnosis not present

## 2019-01-11 DIAGNOSIS — I5033 Acute on chronic diastolic (congestive) heart failure: Secondary | ICD-10-CM | POA: Diagnosis present

## 2019-01-11 DIAGNOSIS — I110 Hypertensive heart disease with heart failure: Secondary | ICD-10-CM | POA: Diagnosis present

## 2019-01-11 DIAGNOSIS — Z8249 Family history of ischemic heart disease and other diseases of the circulatory system: Secondary | ICD-10-CM | POA: Diagnosis not present

## 2019-01-11 DIAGNOSIS — Z880 Allergy status to penicillin: Secondary | ICD-10-CM | POA: Diagnosis not present

## 2019-01-11 DIAGNOSIS — J40 Bronchitis, not specified as acute or chronic: Secondary | ICD-10-CM | POA: Diagnosis present

## 2019-01-11 LAB — BASIC METABOLIC PANEL
Anion gap: 9 (ref 5–15)
BUN: 16 mg/dL (ref 8–23)
CO2: 29 mmol/L (ref 22–32)
Calcium: 8.3 mg/dL — ABNORMAL LOW (ref 8.9–10.3)
Chloride: 103 mmol/L (ref 98–111)
Creatinine, Ser: 0.69 mg/dL (ref 0.44–1.00)
GFR calc Af Amer: >60 mL/min (ref >60)
GFR calc non Af Amer: >60 mL/min (ref >60)
Glucose, Bld: 127 mg/dL — ABNORMAL HIGH (ref 70–99)
Potassium: 4.8 mmol/L (ref 3.5–5.1)
Sodium: 141 mmol/L (ref 135–145)

## 2019-01-11 LAB — SARS CORONAVIRUS 2 (TAT 6-24 HRS): SARS Coronavirus 2: NEGATIVE

## 2019-01-11 MED ORDER — IPRATROPIUM-ALBUTEROL 0.5-2.5 (3) MG/3ML IN SOLN
3.0000 mL | Freq: Three times a day (TID) | RESPIRATORY_TRACT | Status: DC
  Administered 2019-01-12 – 2019-01-14 (×8): 3 mL via RESPIRATORY_TRACT
  Filled 2019-01-11 (×7): qty 3

## 2019-01-11 MED ORDER — GUAIFENESIN ER 600 MG PO TB12
600.0000 mg | ORAL_TABLET | Freq: Two times a day (BID) | ORAL | Status: DC
  Administered 2019-01-12 – 2019-01-14 (×6): 600 mg via ORAL
  Filled 2019-01-11 (×6): qty 1

## 2019-01-11 NOTE — Progress Notes (Signed)
Patient Demographics:    Marisa Bailey, is a 78 y.o. female, DOB - [birth year], ZDG:[id number]  Admit date - 01/10/2019   Admitting Physician Ejiroghene Arlyce Dice, MD  Outpatient Primary MD for the patient is Lucia Gaskins, MD  LOS - 0   Chief Complaint  Patient presents with   Shortness of Breath        Subjective:    Marisa Bailey today has no fevers, no emesis,  No chest pain,   -Complains of shortness of breath  Assessment  & Plan :    Active Problems:   Acute respiratory failure (HCC)   Respiratory failure with hypoxia (HCC)  Brief Summary:-  78 y.o. female with medical history significant for COPD, CHF, hypertension.  Presented to the ED with complaints of sudden onset of difficulty breathing   A./p 1) acute hypoxic respiratory failure--suspect component of CHF and bronchiectasis continues to require oxygen, patient requiring 4 to 5 L of oxygen via nasal cannula  2)HFpEF--CTA chest suggest possible pulmonary edema --continue IV diuresis  3) acute COPD exacerbation/bronchiectasis-- continue steroids, mucolytics bronchodilators and azithromycin, CTA chest with bilateral lower lobe bronchiectasis  4)HTN---   losartan on hold due to contrast exposure, continue amlodipine  5) generalized weakness and deconditioning--- PTA patient lived alone, will get physical therapy evaluation when hypoxia improved  Disposition/Need for in-Hospital Stay- patient unable to be discharged at this time due to -significant hypoxia requiring 4 to 5 L of oxygen via nasal cannula, at baseline patient was not on O2 PTA*  Code Status : full  Family Communication:   NA (patient is alert, awake and coherent)   Disposition Plan  : PTA patient lived alone, will get physical therapy evaluation when hypoxia improves  Consults  :  na  DVT Prophylaxis  :  Lovenox  - SCDs  Lab Results  Component Value Date   PLT  271 01/10/2019    Inpatient Medications  Scheduled Meds:  amLODipine  10 mg Oral Daily   aspirin EC  81 mg Oral QHS   azithromycin  500 mg Oral Daily   enoxaparin (LOVENOX) injection  40 mg Subcutaneous Q24H   ipratropium-albuterol  3 mL Nebulization Q6H   methylPREDNISolone (SOLU-MEDROL) injection  60 mg Intravenous Q12H   Continuous Infusions: PRN Meds:.acetaminophen **OR** acetaminophen, guaiFENesin-dextromethorphan, ipratropium-albuterol, ondansetron **OR** ondansetron (ZOFRAN) IV, polyethylene glycol    Anti-infectives (From admission, onward)   Start     Dose/Rate Route Frequency Ordered Stop   01/11/19 1300  azithromycin (ZITHROMAX) tablet 500 mg     500 mg Oral Daily 01/10/19 1839     01/10/19 1330  cefTRIAXone (ROCEPHIN) 1 g in sodium chloride 0.9 % 100 mL IVPB     1 g 200 mL/hr over 30 Minutes Intravenous  Once 01/10/19 1317 01/10/19 1606   01/10/19 1330  azithromycin (ZITHROMAX) tablet 500 mg     500 mg Oral  Once 01/10/19 1317 01/10/19 1327        Objective:   Vitals:   01/11/19 0845 01/11/19 1254 01/11/19 1421 01/11/19 1426  BP: 135/65 137/70  (!) 143/68  Pulse: 72 77  75  Resp:  18    Temp: 97.8 F (36.6 C) 98.1 F (36.7 C)  98.1 F (36.7 C)  TempSrc: Oral Oral  Oral  SpO2: 100% 90% 95% 98%  Weight:      Height:        Wt Readings from Last 3 Encounters:  01/10/19 81.5 kg  08/05/18 74.6 kg  01/28/18 78.6 kg     Intake/Output Summary (Last 24 hours) at 01/11/2019 1923 Last data filed at 01/11/2019 1724 Gross per 24 hour  Intake 720 ml  Output 700 ml  Net 20 ml     Physical Exam  Gen:- Awake Alert, no conversational dyspnea at rest, patient does have dyspnea on exertion HEENT:- Marisa Bailey.AT, No sclera icterus Nose- [place] 4 to 5 L/min Eyes-right eye vision loss Neck-Supple Neck,No JVD,.  Lungs-diminished in bases with few scattered wheezes CV- S1, S2 normal, regular  Abd-  +ve B.Sounds, Abd Soft, No tenderness,    Extremity/Skin:- Trace   edema, pedal pulses present  Psych-affect is appropriate, oriented x3 Neuro-generalized weakness no new focal deficits, no tremors   Data Review:   Micro Results Recent Results (from the past 240 hour(s))  SARS CORONAVIRUS 2 (TAT 6-24 HRS) Nasopharyngeal Nasopharyngeal Swab     Status: None   Collection Time: 01/10/19  1:18 PM   Specimen: Nasopharyngeal Swab  Result Value Ref Range Status   SARS Coronavirus 2 NEGATIVE NEGATIVE Final    Comment: (NOTE) SARS-CoV-2 target nucleic acids are NOT DETECTED. The SARS-CoV-2 RNA is generally detectable in upper and lower respiratory specimens during the acute phase of infection. Negative results do not preclude SARS-CoV-2 infection, do not rule out co-infections with other pathogens, and should not be used as the sole basis for treatment or other patient management decisions. Negative results must be combined with clinical observations, patient history, and epidemiological information. The expected result is Negative. Fact Sheet for Patients: SugarRoll.be Fact Sheet for Healthcare Providers: https://www.woods-mathews.com/ This test is not yet approved or cleared by the [place] FDA and  has been authorized for detection and/or diagnosis of SARS-CoV-2 by FDA under an Emergency Use Authorization (EUA). This EUA will remain  in effect (meaning this test can be used) for the duration of the COVID-19 declaration under Section 56 4(b)(1) of the Act, 21 U.S.C. section 360bbb-3(b)(1), unless the authorization is terminated or revoked sooner. Performed at [facility], [place] [address], [place], [place] [address]   Culture, blood (routine x 2)     Status: None (Preliminary result)   Collection Time: 01/10/19  2:14 PM   Specimen: Left Antecubital; Blood  Result Value Ref Range Status   Specimen Description   Final    LEFT ANTECUBITAL BOTTLES DRAWN AEROBIC AND ANAEROBIC   Special Requests Blood  Culture adequate volume  Final   Culture   Final    NO GROWTH < 24 HOURS Performed at [facility], [address], [place], [place] [address]    Report Status PENDING  Incomplete  Culture, blood (routine x 2)     Status: None (Preliminary result)   Collection Time: 01/10/19  2:16 PM   Specimen: BLOOD LEFT HAND  Result Value Ref Range Status   Specimen Description BLOOD LEFT HAND BOTTLES DRAWN AEROBIC ONLY  Final   Special Requests   Final    Blood Culture results may not be optimal due to an inadequate volume of blood received in culture bottles   Culture   Final    NO GROWTH < 24 HOURS Performed at [facility], [address], [place], [place] [address]    Report Status PENDING  Incomplete  Radiology Reports Ct Angio Chest Pe W And/or Wo Contrast  Result Date: 01/10/2019 CLINICAL DATA:  Worsening shortness of breath. Evaluate for pulmonary embolus. EXAM: CT ANGIOGRAPHY CHEST WITH CONTRAST TECHNIQUE: Multidetector CT imaging of the chest was performed using the standard protocol during bolus administration of intravenous contrast. Multiplanar CT image reconstructions and MIPs were obtained to evaluate the vascular anatomy. CONTRAST:  126m OMNIPAQUE IOHEXOL 350 MG/ML SOLN COMPARISON:  P chest 01/24/2018 FINDINGS: Cardiovascular: Heart is mildly enlarged. Trace fluid superior pericardial recess. Thoracic aortic and coronary arterial vascular calcifications. Adequate opacification of the pulmonary arterial system. Motion artifact limits evaluation. No definite intraluminal filling defect identified to suggest acute pulmonary embolus. Mediastinum/Nodes: No enlarged axillary, mediastinal or hilar lymphadenopathy. Normal appearance of the esophagus. Lungs/Pleura: Central airways are patent. Dependent atelectasis within the bilateral lower lobes. Smooth interlobular septal thickening. Trace effusions bilaterally. Bilateral lower lobe bronchiectasis. No pneumothorax. Upper Abdomen: No acute  process. Musculoskeletal: Thoracic spine degenerative changes. No aggressive or acute appearing osseous lesions. Review of the MIP images confirms the above findings. IMPRESSION: No evidence for acute pulmonary embolus. Bilateral lower lobe bronchiectasis and subpleural reticular opacities suggestive of scarring. Bilateral smooth interlobular septal thickening which may represent mild component of edema. Aortic Atherosclerosis (ICD10-I70.0). Electronically Signed   By: DLovey NewcomerM.D.   On: 01/10/2019 16:00   Dg Chest Port 1 View  Result Date: 01/10/2019 CLINICAL DATA:  Shortness of breath with decreased oxygen saturation EXAM: PORTABLE CHEST 1 VIEW COMPARISON:  August 01, 2018 FINDINGS: There is airspace opacity in both lung bases with atelectatic change in each mid lung. Upper lung regions elsewhere clear. There is cardiomegaly with pulmonary vascularity within normal limits. No adenopathy. No bone lesions. IMPRESSION: Patchy bibasilar airspace disease with midlung atelectatic change. Stable cardiomegaly. No adenopathy. Followup PA and lateral chest radiographs is recommended in 3-4 weeks following trial of antibiotic therapy to ensure resolution and exclude underlying malignancy. Electronically Signed   By: WLowella GripIII M.D.   On: 01/10/2019 12:41     CBC Recent Labs  Lab 01/10/19 1224  WBC 7.4  HGB 13.9  HCT 45.8  PLT 271  MCV 96.6  MCH 29.3  MCHC 30.3  RDW 13.3  LYMPHSABS 1.0  MONOABS 0.3  EOSABS 0.4  BASOSABS 0.1    Chemistries  Recent Labs  Lab 01/10/19 1224 01/11/19 0531  NA 138 141  K 3.7 4.8  CL 101 103  CO2 27 29  GLUCOSE 119* 127*  BUN 11 16  CREATININE 0.71 0.69  CALCIUM 8.4* 8.3*   ------------------------------------------------------------------------------------------------------------------ No results for input(s): CHOL, HDL, LDLCALC, TRIG, CHOLHDL, LDLDIRECT in the last 72 hours.  No results found for:  HGBA1C ------------------------------------------------------------------------------------------------------------------ No results for input(s): TSH, T4TOTAL, T3FREE, THYROIDAB in the last 72 hours.  Invalid input(s): FREET3 ------------------------------------------------------------------------------------------------------------------ No results for input(s): VITAMINB12, FOLATE, FERRITIN, TIBC, IRON, RETICCTPCT in the last 72 hours.  Coagulation profile No results for input(s): INR, PROTIME in the last 168 hours.  No results for input(s): DDIMER in the last 72 hours.  Cardiac Enzymes No results for input(s): CKMB, TROPONINI, MYOGLOBIN in the last 168 hours.  Invalid input(s): CK ------------------------------------------------------------------------------------------------------------------    Component Value Date/Time   BNP 297.0 (H) 01/10/2019 1224     CRoxan HockeyM.D on 01/11/2019 at 7:23 PM  Go to [email or web address] - for contact info  [facility] - Office  [phone]

## 2019-01-12 MED ORDER — FUROSEMIDE 10 MG/ML IJ SOLN
40.0000 mg | Freq: Two times a day (BID) | INTRAMUSCULAR | Status: DC
  Administered 2019-01-12 – 2019-01-14 (×4): 40 mg via INTRAVENOUS
  Filled 2019-01-12 (×4): qty 4

## 2019-01-12 NOTE — Plan of Care (Signed)

## 2019-01-12 NOTE — Progress Notes (Signed)
Patient Demographics:    Marisa Bailey, is a 78 y.o. female, DOB - [birth year], KGO:[id number]  Admit date - 01/10/2019   Admitting Physician Ejiroghene Arlyce Dice, MD  Outpatient Primary MD for the patient is Lucia Gaskins, MD  LOS - 1   Chief Complaint  Patient presents with   Shortness of Breath        Subjective:    Marisa Bailey today has no fevers, no emesis,  No chest pain,   -No productive cough, very dyspneic and hypoxic with minimal exertion -Requiring O2  Assessment  & Plan :    Active Problems:   Acute respiratory failure (HCC)   Respiratory failure with hypoxia (HCC)  Brief Summary:-  78 y.o. female with medical history significant for COPD, CHF, hypertension.  Presented to the ED with complaints of sudden onset of difficulty breathing admitted 01/10/2019 with acute hypoxic respiratory failure due to presumed CHF exacerbation and bronchiectasis flareup  A./p 1) acute hypoxic respiratory failure--suspect component of CHF and bronchiectasis -- -try to wean down O2 requirement  -Currently on 3 to 4 L/min oxygen via nasal cannula  2)HFpEF--patient with chronic diastolic dysfunction CHF -CTA chest suggest possible pulmonary edema -Last known EF 50 to 55% based on echo from 05/02/2018 --continue IV diuresis for acute on chronic diastolic dysfunction/exacerbation -Daily weights, fluid input and output monitoring  -Repeat chest x-ray in the morning  3) acute COPD exacerbation/bronchiectasis-- continue steroids, mucolytics bronchodilators and azithromycin, CTA chest with bilateral lower lobe bronchiectasis -Continue to wean down oxygen  4)HTN--- BP stable at this time losartan on hold due to contrast exposure, continue amlodipine  5) generalized weakness and deconditioning--- PTA patient lived alone, will get physical therapy evaluation when hypoxia improved  Disposition/Need for  in-Hospital Stay- patient unable to be discharged at this time due to -new onset hypoxia very dyspneic and hypoxic with minimal exertion  Code Status : full  Family Communication:   NA (patient is alert, awake and coherent)   Disposition Plan  : PTA patient lived alone, will get physical therapy evaluation when hypoxia improves  Consults  :  na  DVT Prophylaxis  :  Lovenox  - SCDs  Lab Results  Component Value Date   PLT 271 01/10/2019    Inpatient Medications  Scheduled Meds:  amLODipine  10 mg Oral Daily   aspirin EC  81 mg Oral QHS   azithromycin  500 mg Oral Daily   enoxaparin (LOVENOX) injection  40 mg Subcutaneous Q24H   furosemide  40 mg Intravenous Q12H   guaiFENesin  600 mg Oral BID   ipratropium-albuterol  3 mL Nebulization TID   methylPREDNISolone (SOLU-MEDROL) injection  60 mg Intravenous Q12H   Continuous Infusions: PRN Meds:.acetaminophen **OR** acetaminophen, guaiFENesin-dextromethorphan, ipratropium-albuterol, ondansetron **OR** ondansetron (ZOFRAN) IV, polyethylene glycol    Anti-infectives (From admission, onward)   Start     Dose/Rate Route Frequency Ordered Stop   01/11/19 1300  azithromycin (ZITHROMAX) tablet 500 mg     500 mg Oral Daily 01/10/19 1839     01/10/19 1330  cefTRIAXone (ROCEPHIN) 1 g in sodium chloride 0.9 % 100 mL IVPB     1 g 200 mL/hr over 30 Minutes Intravenous  Once 01/10/19 1317 01/10/19 1606   01/10/19 1330  azithromycin (ZITHROMAX) tablet 500 mg     500 mg Oral  Once 01/10/19 1317 01/10/19 1327        Objective:   Vitals:   01/12/19 0550 01/12/19 0848 01/12/19 1434 01/12/19 1957  BP: 133/72     Pulse: 61     Resp: 20     Temp:      TempSrc:      SpO2: 100% 97% 97% 96%  Weight:      Height:        Wt Readings from Last 3 Encounters:  01/10/19 81.5 kg  08/05/18 74.6 kg  01/28/18 78.6 kg     Intake/Output Summary (Last 24 hours) at 01/12/2019 2050 Last data filed at 01/12/2019 0207 Gross per 24 hour   Intake --  Output 700 ml  Net -700 ml     Physical Exam  Gen:- Awake Alert, no conversational dyspnea at rest, patient does have dyspnea on exertion HEENT:- [place].AT, No sclera icterus Nose- [place] 4 to 5 L/min Eyes-right eye vision loss Neck-Supple Neck,No JVD,.  Lungs-diminished in bases with few scattered wheezes CV- S1, S2 normal, regular  Abd-  +ve B.Sounds, Abd Soft, No tenderness,    Extremity/Skin:- Trace  edema, pedal pulses present  Psych-affect is appropriate, oriented x3 Neuro-generalized weakness no new focal deficits, no tremors   Data Review:   Micro Results Recent Results (from the past 240 hour(s))  SARS CORONAVIRUS 2 (TAT 6-24 HRS) Nasopharyngeal Nasopharyngeal Swab     Status: None   Collection Time: 01/10/19  1:18 PM   Specimen: Nasopharyngeal Swab  Result Value Ref Range Status   SARS Coronavirus 2 NEGATIVE NEGATIVE Final    Comment: (NOTE) SARS-CoV-2 target nucleic acids are NOT DETECTED. The SARS-CoV-2 RNA is generally detectable in upper and lower respiratory specimens during the acute phase of infection. Negative results do not preclude SARS-CoV-2 infection, do not rule out co-infections with other pathogens, and should not be used as the sole basis for treatment or other patient management decisions. Negative results must be combined with clinical observations, patient history, and epidemiological information. The expected result is Negative. Fact Sheet for Patients: SugarRoll.be Fact Sheet for Healthcare Providers: https://www.woods-mathews.com/ This test is not yet approved or cleared by the [place] FDA and  has been authorized for detection and/or diagnosis of SARS-CoV-2 by FDA under an Emergency Use Authorization (EUA). This EUA will remain  in effect (meaning this test can be used) for the duration of the COVID-19 declaration under Section 56 4(b)(1) of the Act, 21 U.S.C. section 360bbb-3(b)(1),  unless the authorization is terminated or revoked sooner. Performed at [facility], [place] [address] [place], [place] [address]   Culture, blood (routine x 2)     Status: None (Preliminary result)   Collection Time: 01/10/19  2:14 PM   Specimen: Left Antecubital; Blood  Result Value Ref Range Status   Specimen Description   Final    LEFT ANTECUBITAL BOTTLES DRAWN AEROBIC AND ANAEROBIC   Special Requests Blood Culture adequate volume  Final   Culture   Final    NO GROWTH 2 DAYS Performed at [facility], [address] [place],  [address]    Report Status PENDING  Incomplete  Culture, blood (routine x 2)     Status: None (Preliminary result)   Collection Time: 01/10/19  2:16 PM   Specimen: BLOOD LEFT HAND  Result Value Ref Range Status   Specimen Description BLOOD LEFT HAND BOTTLES DRAWN AEROBIC ONLY  Final   Special Requests   Final    Blood Culture results may not be optimal due to an inadequate volume of blood received in culture bottles   Culture   Final    NO GROWTH 2 DAYS Performed at [facility], [address], [place], [place] [address]    Report Status PENDING  Incomplete    Radiology Reports Ct Angio Chest Pe W And/or Wo Contrast  Result Date: 01/10/2019 CLINICAL DATA:  Worsening shortness of breath. Evaluate for pulmonary embolus. EXAM: CT ANGIOGRAPHY CHEST WITH CONTRAST TECHNIQUE: Multidetector CT imaging of the chest was performed using the standard protocol during bolus administration of intravenous contrast. Multiplanar CT image reconstructions and MIPs were obtained to evaluate the vascular anatomy. CONTRAST:  157m OMNIPAQUE IOHEXOL 350 MG/ML SOLN COMPARISON:  P chest 01/24/2018 FINDINGS: Cardiovascular: Heart is mildly enlarged. Trace fluid superior pericardial recess. Thoracic aortic and coronary arterial vascular calcifications. Adequate opacification of the pulmonary arterial system. Motion artifact limits evaluation. No definite intraluminal  filling defect identified to suggest acute pulmonary embolus. Mediastinum/Nodes: No enlarged axillary, mediastinal or hilar lymphadenopathy. Normal appearance of the esophagus. Lungs/Pleura: Central airways are patent. Dependent atelectasis within the bilateral lower lobes. Smooth interlobular septal thickening. Trace effusions bilaterally. Bilateral lower lobe bronchiectasis. No pneumothorax. Upper Abdomen: No acute process. Musculoskeletal: Thoracic spine degenerative changes. No aggressive or acute appearing osseous lesions. Review of the MIP images confirms the above findings. IMPRESSION: No evidence for acute pulmonary embolus. Bilateral lower lobe bronchiectasis and subpleural reticular opacities suggestive of scarring. Bilateral smooth interlobular septal thickening which may represent mild component of edema. Aortic Atherosclerosis (ICD10-I70.0). Electronically Signed   By: DLovey NewcomerM.D.   On: 01/10/2019 16:00   Dg Chest Port 1 View  Result Date: 01/10/2019 CLINICAL DATA:  Shortness of breath with decreased oxygen saturation EXAM: PORTABLE CHEST 1 VIEW COMPARISON:  August 01, 2018 FINDINGS: There is airspace opacity in both lung bases with atelectatic change in each mid lung. Upper lung regions elsewhere clear. There is cardiomegaly with pulmonary vascularity within normal limits. No adenopathy. No bone lesions. IMPRESSION: Patchy bibasilar airspace disease with midlung atelectatic change. Stable cardiomegaly. No adenopathy. Followup PA and lateral chest radiographs is recommended in 3-4 weeks following trial of antibiotic therapy to ensure resolution and exclude underlying malignancy. Electronically Signed   By: WLowella GripIII M.D.   On: 01/10/2019 12:41     CBC Recent Labs  Lab 01/10/19 1224  WBC 7.4  HGB 13.9  HCT 45.8  PLT 271  MCV 96.6  MCH 29.3  MCHC 30.3  RDW 13.3  LYMPHSABS 1.0  MONOABS 0.3  EOSABS 0.4  BASOSABS 0.1    Chemistries  Recent Labs  Lab 01/10/19 1224  01/11/19 0531  NA 138 141  K 3.7 4.8  CL 101 103  CO2 27 29  GLUCOSE 119* 127*  BUN 11 16  CREATININE 0.71 0.69  CALCIUM 8.4* 8.3*   ------------------------------------------------------------------------------------------------------------------ No results for input(s): CHOL, HDL, LDLCALC, TRIG, CHOLHDL, LDLDIRECT in the last 72 hours.  No results found for: HGBA1C ------------------------------------------------------------------------------------------------------------------ No results for input(s): TSH, T4TOTAL, T3FREE, THYROIDAB in the last 72 hours.  Invalid input(s): FREET3 ------------------------------------------------------------------------------------------------------------------ No results for input(s): VITAMINB12, FOLATE, FERRITIN, TIBC, IRON, RETICCTPCT in the last 72 hours.  Coagulation profile No results for input(s): INR, PROTIME in the last 168 hours.  No results for input(s): DDIMER in the last 72 hours.  Cardiac Enzymes No results for input(s): CKMB, TROPONINI, MYOGLOBIN in the last 168 hours.  Invalid input(s): CK ------------------------------------------------------------------------------------------------------------------    Component Value Date/Time   BNP 297.0 (H) 01/10/2019 1224     Roxan Hockey M.D on 01/12/2019 at 8:50 PM  Go to [email or web address] - for contact info  [facility] - Office  [phone]

## 2019-01-13 ENCOUNTER — Inpatient Hospital Stay (HOSPITAL_COMMUNITY): Payer: Medicare Other

## 2019-01-13 DIAGNOSIS — I10 Essential (primary) hypertension: Secondary | ICD-10-CM

## 2019-01-13 DIAGNOSIS — J449 Chronic obstructive pulmonary disease, unspecified: Secondary | ICD-10-CM

## 2019-01-13 MED ORDER — SODIUM CHLORIDE 0.9 % IV SOLN
1.0000 g | INTRAVENOUS | Status: DC
  Administered 2019-01-13 – 2019-01-14 (×2): 1 g via INTRAVENOUS
  Filled 2019-01-13 (×2): qty 10

## 2019-01-13 MED ORDER — LOSARTAN POTASSIUM 50 MG PO TABS
25.0000 mg | ORAL_TABLET | Freq: Every day | ORAL | Status: DC
  Administered 2019-01-13: 25 mg via ORAL
  Filled 2019-01-13: qty 1

## 2019-01-13 NOTE — Evaluation (Addendum)
Physical Therapy Evaluation Patient Details Name: Marisa Bailey MRN: [id number] DOB: 02-07-1941 Today's Date: 01/13/2019   History of Present Illness  Marisa Bailey is a 78 y.o. female with medical history significant for COPD, CHF, hypertension.  Presented to the ED with complaints of sudden onset of difficulty breathing that started this morning.  Patient is a poor historian and hard of hearing.  She reports cough, productive of whitish sputum.  Denies fever or chills.  No chest pain.  She does not check her weight daily.  Reports her abdomen looks full, but likely this is normal for her.  She does not think she has added weight.,  Though she does not check her weights daily.  Denies leg swelling.  She is not on home O2.    Clinical Impression  Patient functioning at baseline for functional mobility and gait.  Patient ambulated on room air with SpO2 dropping from 85% to 75%, put back on 3 LPM and SpO2 increased to 100% while resting at bedside - RN notified.  Plan:  Patient discharged from physical therapy to care of nursing for ambulation daily as tolerated for length of stay.    Follow Up Recommendations No PT follow up    Equipment Recommendations  None recommended by PT    Recommendations for Other Services       Precautions / Restrictions Precautions Precautions: None Restrictions Weight Bearing Restrictions: No      Mobility  Bed Mobility Overal bed mobility: Independent                Transfers Overall transfer level: Independent                  Ambulation/Gait Ambulation/Gait assistance: Independent Gait Distance (Feet): 200 Feet Assistive device: None Gait Pattern/deviations: WFL(Within Functional Limits) Gait velocity: normal   General Gait Details: demonstrates good return for ambulation in room  and hallways without loss of balance, on room air with SpO2 dropping from 85% to 75%  Stairs            Wheelchair Mobility    Modified  Rankin (Stroke Patients Only)       Balance Overall balance assessment: No apparent balance deficits (not formally assessed)                                           Pertinent Vitals/Pain Pain Assessment: No/denies pain    Home Living Family/patient expects to be discharged to:: Private residence Living Arrangements: Alone Available Help at Discharge: Family Type of Home: Mobile home Home Access: Stairs to enter Entrance Stairs-Rails: Right;Left;Can reach both Entrance Stairs-Number of Steps: 5 Home Layout: One level Home Equipment: Cane - single point      Prior Function Level of Independence: Independent         Comments: community ambulator, drives     Hand Dominance   Dominant Hand: Right    Extremity/Trunk Assessment   Upper Extremity Assessment Upper Extremity Assessment: Overall WFL for tasks assessed    Lower Extremity Assessment Lower Extremity Assessment: Overall WFL for tasks assessed    Cervical / Trunk Assessment Cervical / Trunk Assessment: Normal  Communication   Communication: No difficulties  Cognition Arousal/Alertness: Awake/alert Behavior During Therapy: WFL for tasks assessed/performed Overall Cognitive Status: Within Functional Limits for tasks assessed  General Comments      Exercises     Assessment/Plan    PT Assessment Patent does not need any further PT services  PT Problem List         PT Treatment Interventions      PT Goals (Current goals can be found in the Care Plan section)  Acute Rehab PT Goals Patient Stated Goal: return home PT Goal Formulation: With patient Time For Goal Achievement: 01/13/19 Potential to Achieve Goals: Good    Frequency     Barriers to discharge        Co-evaluation               AM-PAC PT "6 Clicks" Mobility  Outcome Measure Help needed turning from your back to your side while in a flat bed  without using bedrails?: None Help needed moving from lying on your back to sitting on the side of a flat bed without using bedrails?: None Help needed moving to and from a bed to a chair (including a wheelchair)?: None Help needed standing up from a chair using your arms (e.g., wheelchair or bedside chair)?: None Help needed to walk in hospital room?: None Help needed climbing 3-5 steps with a railing? : None 6 Click Score: 24    End of Session Equipment Utilized During Treatment: Oxygen Activity Tolerance: Patient tolerated treatment well Patient left: in bed;with call bell/phone within reach(seated at bedside) Nurse Communication: Mobility status PT Visit Diagnosis: Unsteadiness on feet (R26.81);Other abnormalities of gait and mobility (R26.89);Muscle weakness (generalized) (M62.81)    Time: 8756-4332 PT Time Calculation (min) (ACUTE ONLY): 22 min   Charges:   PT Evaluation $PT Eval Moderate Complexity: 1 Mod PT Treatments $Therapeutic Activity: 8-22 mins        2:12 PM, 01/13/19 Lonell Grandchild, MPT Physical Therapist with [facility] [phone] office [phone] mobile phone

## 2019-01-13 NOTE — Progress Notes (Signed)
SATURATION QUALIFICATIONS: (This note is used to comply with regulatory documentation for home oxygen)  Patient Saturations on Room Air at Rest = 80%  Patient Saturations on Room Air while Ambulating = 75%  Please briefly explain why patient needs home oxygen: Patient is still unable to maintain O2 sats above 90% on room air even when sitting in chair.  Patient is currently back on 3L via [place] with O2 sat back at 100%.

## 2019-01-13 NOTE — Plan of Care (Signed)

## 2019-01-13 NOTE — Progress Notes (Signed)
Patient Demographics:    Marisa Bailey, is a 78 y.o. female, DOB - Dec 24, 1940, KVQ:[id number]  Admit date - 01/10/2019   Admitting Physician Ejiroghene Arlyce Dice, MD  Outpatient Primary MD for the patient is Lucia Gaskins, MD  LOS - 2   Chief Complaint  Patient presents with  . Shortness of Breath        Subjective:    [name] today has no fevers, no emesis,  No chest pain,    Oxy sats down to 80% on room air, desaturation down to 75% on minimal activity -Dyspnea on exertion and nonproductive cough persist   Assessment  & Plan :    Principal Problem:   Acute Respiratory failure with hypoxia (HCC) Active Problems:   COPD (chronic obstructive pulmonary disease) (HCC)   Hypertension   Tobacco abuse  Brief Summary:-  78 y.o. female with medical history significant for COPD, CHF, hypertension.  Presented to the ED with complaints of sudden onset of difficulty breathing admitted 01/10/2019 with acute hypoxic respiratory failure due to presumed CHF exacerbation and bronchiectasis flareup  A/p 1)Acute hypoxic respiratory failure--suspect component of CHF and bronchiectasis -- -remains very dyspneic and hypoxic even with minimum activity -Currently on 3 to 4 L/min oxygen via nasal cannula -Patient desaturates very quickly off O2, admission without PE, - she will most likely need home O2   2)HFpEF--patient with chronic diastolic dysfunction CHF -CTA chest suggest possible pulmonary edema -Last known EF 50 to 55%  based on echo from 05/02/2018 --continue IV diuresis for acute on chronic diastolic dysfunction/exacerbation -Fluid balance remains negative, no accurate with available -Daily weights, fluid input and output monitoring requested -Repeat chest x-ray pending  3) acute COPD exacerbation/bronchiectasis-- continue IV Solu-Medrol, mucolytics bronchodilators and azithromycin, CTA chest  with bilateral lower lobe bronchiectasis Unable to wean O2, patient continues to require 3 to 4 L of oxygen via nasal cannula -IV Rocephin added (patient tolerated cephalosporins previously) -  4)HTN--- BP is not at goal, okay to restart losartan  continue amlodipine  may use IV labetalol when necessary  Every 4 hours for systolic blood pressure over 160 mmhg  5) generalized weakness and deconditioning--- PTA patient lived alone,  physical therapy evaluation requested   Disposition/Need for in-Hospital Stay- patient unable to be discharged at this time due to -new onset hypoxia very dyspneic and hypoxic with minimal exertion  Code Status : full  Family Communication:   NA (patient is alert, awake and coherent)   Disposition Plan  : PTA patient lived alone, significant hypoxia and dyspnea persist  Consults  :  na  DVT Prophylaxis  :  Lovenox  - SCDs  Lab Results  Component Value Date   PLT 271 01/10/2019    Inpatient Medications  Scheduled Meds: . amLODipine  10 mg Oral Daily  . aspirin EC  81 mg Oral QHS  . azithromycin  500 mg Oral Daily  . enoxaparin (LOVENOX) injection  40 mg Subcutaneous Q24H  . furosemide  40 mg Intravenous Q12H  . guaiFENesin  600 mg Oral BID  . ipratropium-albuterol  3 mL Nebulization TID  . methylPREDNISolone (SOLU-MEDROL) injection  60 mg Intravenous Q12H   Continuous Infusions: . cefTRIAXone (ROCEPHIN)  IV     PRN Meds:.acetaminophen **  OR** acetaminophen, guaiFENesin-dextromethorphan, ipratropium-albuterol, ondansetron **OR** ondansetron (ZOFRAN) IV, polyethylene glycol    Anti-infectives (From admission, onward)   Start     Dose/Rate Route Frequency Ordered Stop   01/13/19 1300  cefTRIAXone (ROCEPHIN) 1 g in sodium chloride 0.9 % 100 mL IVPB     1 g 200 mL/hr over 30 Minutes Intravenous Every 24 hours 01/13/19 1252     01/11/19 1300  azithromycin (ZITHROMAX) tablet 500 mg     500 mg Oral Daily 01/10/19 1839     01/10/19 1330   cefTRIAXone (ROCEPHIN) 1 g in sodium chloride 0.9 % 100 mL IVPB     1 g 200 mL/hr over 30 Minutes Intravenous  Once 01/10/19 1317 01/10/19 1606   01/10/19 1330  azithromycin (ZITHROMAX) tablet 500 mg     500 mg Oral  Once 01/10/19 1317 01/10/19 1327        Objective:   Vitals:   01/12/19 1957 01/12/19 2100 01/13/19 0500 01/13/19 0845  BP:  (!) 156/72 (!) 151/73   Pulse:      Resp:  20 20   Temp:  97.6 F (36.4 C) 98.3 F (36.8 C)   TempSrc:  Oral Oral   SpO2: 96% 94% 96% 98%  Weight:      Height:        Wt Readings from Last 3 Encounters:  01/10/19 81.5 kg  08/05/18 74.6 kg  01/28/18 78.6 kg     Intake/Output Summary (Last 24 hours) at 01/13/2019 1257 Last data filed at 01/13/2019 0900 Gross per 24 hour  Intake 240 ml  Output 1150 ml  Net -910 ml   Physical Exam  Gen:- Awake Alert, no conversational dyspnea at rest, patient does have dyspnea on exertion HEENT:- [place].AT, right eye vision loss  nose- [place] 4 to 5 L/min Neck-Supple Neck,No JVD,.  Lungs-diminished in bases with few scattered wheezes CV- S1, S2 normal, regular  Abd-  +ve B.Sounds, Abd Soft, No tenderness,    Extremity/Skin:- Trace  edema, pedal pulses present  Psych-affect is appropriate, oriented x3 Neuro-generalized weakness no new focal deficits, no tremors   Data Review:   Micro Results Recent Results (from the past 240 hour(s))  SARS CORONAVIRUS 2 (TAT 6-24 HRS) Nasopharyngeal Nasopharyngeal Swab     Status: None   Collection Time: 01/10/19  1:18 PM   Specimen: Nasopharyngeal Swab  Result Value Ref Range Status   SARS Coronavirus 2 NEGATIVE NEGATIVE Final    Comment: (NOTE) SARS-CoV-2 target nucleic acids are NOT DETECTED. The SARS-CoV-2 RNA is generally detectable in upper and lower respiratory specimens during the acute phase of infection. Negative results do not preclude SARS-CoV-2 infection, do not rule out co-infections with other pathogens, and should not be used as the sole basis for  treatment or other patient management decisions. Negative results must be combined with clinical observations, patient history, and epidemiological information. The expected result is Negative. Fact Sheet for Patients: SugarRoll.be Fact Sheet for Healthcare Providers: https://www.woods-mathews.com/ This test is not yet approved or cleared by the [place] FDA and  has been authorized for detection and/or diagnosis of SARS-CoV-2 by FDA under an Emergency Use Authorization (EUA). This EUA will remain  in effect (meaning this test can be used) for the duration of the COVID-19 declaration under Section 56 4(b)(1) of the Act, 21 U.S.C. section 360bbb-3(b)(1), unless the authorization is terminated or revoked sooner. Performed at [facility], [place] [address] [place],  [address]   Culture, blood (routine x 2)  Status: None (Preliminary result)   Collection Time: 01/10/19  2:14 PM   Specimen: Left Antecubital; Blood  Result Value Ref Range Status   Specimen Description   Final    LEFT ANTECUBITAL BOTTLES DRAWN AEROBIC AND ANAEROBIC   Special Requests Blood Culture adequate volume  Final   Culture   Final    NO GROWTH 3 DAYS Performed at [facility], [address], [place], [place] [address]    Report Status PENDING  Incomplete  Culture, blood (routine x 2)     Status: None (Preliminary result)   Collection Time: 01/10/19  2:16 PM   Specimen: BLOOD LEFT HAND  Result Value Ref Range Status   Specimen Description BLOOD LEFT HAND BOTTLES DRAWN AEROBIC ONLY  Final   Special Requests   Final    Blood Culture results may not be optimal due to an inadequate volume of blood received in culture bottles   Culture   Final    NO GROWTH 3 DAYS Performed at [facility], [address] [place], [place] [address]    Report Status PENDING  Incomplete    Radiology Reports Ct Angio Chest Pe W And/or Wo Contrast  Result Date:  01/10/2019 CLINICAL DATA:  Worsening shortness of breath. Evaluate for pulmonary embolus. EXAM: CT ANGIOGRAPHY CHEST WITH CONTRAST TECHNIQUE: Multidetector CT imaging of the chest was performed using the standard protocol during bolus administration of intravenous contrast. Multiplanar CT image reconstructions and MIPs were obtained to evaluate the vascular anatomy. CONTRAST:  147m OMNIPAQUE IOHEXOL 350 MG/ML SOLN COMPARISON:  P chest 01/24/2018 FINDINGS: Cardiovascular: Heart is mildly enlarged. Trace fluid superior pericardial recess. Thoracic aortic and coronary arterial vascular calcifications. Adequate opacification of the pulmonary arterial system. Motion artifact limits evaluation. No definite intraluminal filling defect identified to suggest acute pulmonary embolus. Mediastinum/Nodes: No enlarged axillary, mediastinal or hilar lymphadenopathy. Normal appearance of the esophagus. Lungs/Pleura: Central airways are patent. Dependent atelectasis within the bilateral lower lobes. Smooth interlobular septal thickening. Trace effusions bilaterally. Bilateral lower lobe bronchiectasis. No pneumothorax. Upper Abdomen: No acute process. Musculoskeletal: Thoracic spine degenerative changes. No aggressive or acute appearing osseous lesions. Review of the MIP images confirms the above findings. IMPRESSION: No evidence for acute pulmonary embolus. Bilateral lower lobe bronchiectasis and subpleural reticular opacities suggestive of scarring. Bilateral smooth interlobular septal thickening which may represent mild component of edema. Aortic Atherosclerosis (ICD10-I70.0). Electronically Signed   By: DLovey NewcomerM.D.   On: 01/10/2019 16:00   Dg Chest Port 1 View  Result Date: 01/10/2019 CLINICAL DATA:  Shortness of breath with decreased oxygen saturation EXAM: PORTABLE CHEST 1 VIEW COMPARISON:  August 01, 2018 FINDINGS: There is airspace opacity in both lung bases with atelectatic change in each mid lung. Upper lung  regions elsewhere clear. There is cardiomegaly with pulmonary vascularity within normal limits. No adenopathy. No bone lesions. IMPRESSION: Patchy bibasilar airspace disease with midlung atelectatic change. Stable cardiomegaly. No adenopathy. Followup PA and lateral chest radiographs is recommended in 3-4 weeks following trial of antibiotic therapy to ensure resolution and exclude underlying malignancy. Electronically Signed   By: WLowella GripIII M.D.   On: 01/10/2019 12:41     CBC Recent Labs  Lab 01/10/19 1224  WBC 7.4  HGB 13.9  HCT 45.8  PLT 271  MCV 96.6  MCH 29.3  MCHC 30.3  RDW 13.3  LYMPHSABS 1.0  MONOABS 0.3  EOSABS 0.4  BASOSABS 0.1    Chemistries  Recent Labs  Lab 01/10/19 1224 01/11/19 0531  NA 138 141  K 3.7 4.8  CL 101 103  CO2 27 29  GLUCOSE 119* 127*  BUN 11 16  CREATININE 0.71 0.69  CALCIUM 8.4* 8.3*   ------------------------------------------------------------------------------------------------------------------ No results for input(s): CHOL, HDL, LDLCALC, TRIG, CHOLHDL, LDLDIRECT in the last 72 hours.  No results found for: HGBA1C ------------------------------------------------------------------------------------------------------------------ No results for input(s): TSH, T4TOTAL, T3FREE, THYROIDAB in the last 72 hours.  Invalid input(s): FREET3 ------------------------------------------------------------------------------------------------------------------ No results for input(s): VITAMINB12, FOLATE, FERRITIN, TIBC, IRON, RETICCTPCT in the last 72 hours.  Coagulation profile No results for input(s): INR, PROTIME in the last 168 hours.  No results for input(s): DDIMER in the last 72 hours.  Cardiac Enzymes No results for input(s): CKMB, TROPONINI, MYOGLOBIN in the last 168 hours.  Invalid input(s): CK ------------------------------------------------------------------------------------------------------------------    Component  Value Date/Time   BNP 297.0 (H) 01/10/2019 1224   Roxan Hockey M.D on 01/13/2019 at 12:57 PM  Go to [email or web address] - for contact info  [facility] - Office  [phone]

## 2019-01-14 LAB — BASIC METABOLIC PANEL
Anion gap: 14 (ref 5–15)
BUN: 32 mg/dL — ABNORMAL HIGH (ref 8–23)
CO2: 33 mmol/L — ABNORMAL HIGH (ref 22–32)
Calcium: 9.1 mg/dL (ref 8.9–10.3)
Chloride: 93 mmol/L — ABNORMAL LOW (ref 98–111)
Creatinine, Ser: 0.77 mg/dL (ref 0.44–1.00)
GFR calc Af Amer: >60 mL/min (ref >60)
GFR calc non Af Amer: >60 mL/min (ref >60)
Glucose, Bld: 138 mg/dL — ABNORMAL HIGH (ref 70–99)
Potassium: 4 mmol/L (ref 3.5–5.1)
Sodium: 140 mmol/L (ref 135–145)

## 2019-01-14 MED ORDER — METHYLPREDNISOLONE SODIUM SUCC 40 MG IJ SOLR
40.0000 mg | Freq: Two times a day (BID) | INTRAMUSCULAR | Status: DC
  Filled 2019-01-14: qty 1

## 2019-01-14 MED ORDER — FUROSEMIDE 40 MG PO TABS: 40.0000 mg | ORAL_TABLET | ORAL | 5 refills | Status: DC

## 2019-01-14 MED ORDER — PREDNISONE 20 MG PO TABS: 40.0000 mg | ORAL_TABLET | Freq: Every day | ORAL | 0 refills | Status: DC

## 2019-01-14 MED ORDER — ACETAMINOPHEN 325 MG PO TABS: 650.0000 mg | ORAL_TABLET | Freq: Four times a day (QID) | ORAL | 0 refills | Status: DC | PRN

## 2019-01-14 MED ORDER — OLMESARTAN MEDOXOMIL 40 MG PO TABS: 40.0000 mg | ORAL_TABLET | Freq: Every day | ORAL | 5 refills | Status: DC

## 2019-01-14 MED ORDER — AMLODIPINE BESYLATE 10 MG PO TABS: 10.0000 mg | ORAL_TABLET | Freq: Every day | ORAL | 5 refills | Status: DC

## 2019-01-14 MED ORDER — FOLIC ACID 1 MG PO TABS: 1.0000 mg | ORAL_TABLET | Freq: Every day | ORAL | 11 refills | Status: DC

## 2019-01-14 MED ORDER — LOSARTAN POTASSIUM 50 MG PO TABS
50.0000 mg | ORAL_TABLET | Freq: Every day | ORAL | Status: DC
  Administered 2019-01-14: 50 mg via ORAL
  Filled 2019-01-14: qty 1

## 2019-01-14 MED ORDER — ALBUTEROL SULFATE (2.5 MG/3ML) 0.083% IN NEBU: 2.5000 mg | INHALATION_SOLUTION | Freq: Four times a day (QID) | RESPIRATORY_TRACT | 12 refills | Status: DC | PRN

## 2019-01-14 MED ORDER — BUDESONIDE-FORMOTEROL FUMARATE 160-4.5 MCG/ACT IN AERO: 2.0000 | INHALATION_SPRAY | Freq: Two times a day (BID) | RESPIRATORY_TRACT | 12 refills | Status: DC

## 2019-01-14 MED ORDER — GUAIFENESIN ER 600 MG PO TB12: 600.0000 mg | ORAL_TABLET | Freq: Two times a day (BID) | ORAL | 0 refills | Status: AC

## 2019-01-14 MED ORDER — PROAIR HFA 108 (90 BASE) MCG/ACT IN AERS: 1.0000 | INHALATION_SPRAY | RESPIRATORY_TRACT | 5 refills | Status: DC | PRN

## 2019-01-14 MED ORDER — CEFDINIR 300 MG PO CAPS: 300.0000 mg | ORAL_CAPSULE | Freq: Two times a day (BID) | ORAL | 0 refills | Status: AC

## 2019-01-14 NOTE — Progress Notes (Signed)
Pt lying in bed with O2  at 2lpm on, sats at 100%. Ambulated without o2 approx 50 ft, sats dropped to 85%.  O2 at 2lpm  replaced and sats increased 96%. Pt stated she did not want to go home with O2.

## 2019-01-14 NOTE — Discharge Instructions (Signed)
1)Avoid ibuprofen/Advil/Aleve/Motrin/Goody Powders/Naproxen/BC powders/Meloxicam/Diclofenac/Indomethacin and other Nonsteroidal anti-inflammatory medications as these will make you more likely to bleed and can cause stomach ulcers, can also cause Kidney problems.   2)Patient needs continuous Oxygen  at 2 L/min continuously via nasal cannula with humidifier, with gaseous portability and conserving device  3) follow-up with a primary care physician in about a week for recheck and reevaluation  4)Very low-salt diet advised 5)Weigh yourself daily, call if you gain more than 3 pounds in 1 day or more than 5 pounds in 1 week as your diuretic medications may need to be adjusted 6)Limit your Fluid  intake to no more than 60 ounces (1.8 Liters) per day

## 2019-01-14 NOTE — TOC Transition Note (Signed)
Transition of Care [facility]) - CM/SW Discharge Note   Patient Details  Name: KIERSTYNN BABICH MRN: [id number] Date of Birth: 05-07-1940  Transition of Care [facility]) CM/SW Contact:  Boneta Lucks, RN Phone Number: 01/14/2019, 1:32 PM   Clinical Narrative:  Patient is ready for discharge, walk test indicated need for Home oxygen.  MD ordered Home Oxygen and Nebulizer machine.  [place] with [facility], she will be here within the hour to delivery to the room.  Updated MD and RN.      Barriers to Discharge: Barriers Resolved   Patient Goals and CMS Choice Patient states their goals for this hospitalization and ongoing recovery are:: to go home. CMS Medicare.gov Compare Post Acute Care list provided to:: Patient Choice offered to / list presented to : Patient  Discharge Placement    Discharge Plan and Services   Discharge Planning Services: Other - See comment(oxygen)            DME Arranged: Oxygen DME Agency: [facility] Date DME Agency Contacted: 01/14/19 Time DME Agency Contacted: [phone] Representative spoke with at DME Agency: Blake Divine      Readmission Risk Interventions Readmission Risk Prevention Plan 08/04/2018  Transportation Screening Complete  Some recent data might be hidden

## 2019-01-14 NOTE — Care Management Important Message (Signed)
Patient Saturations on Room Air at Rest =92 % on RA  Patient Saturations on Room Air while Ambulating =85 %  Patient Saturations on2Liters of oxygen while Ambulating = 96 %   Patient needs continuous O2 at 2 L/min continuously via nasal cannula with humidifier, with gaseous portability and conserving device  Dx-COPD with acute hypoxic respiratory failure  Roxan Hockey, MD

## 2019-01-14 NOTE — TOC Progression Note (Signed)
DME Choices:  [facility] [address] [place], [place]  [place], [place] [address] [phone]  [facility] [place], [place] [address] [phone]  [facility] [place] [address] [place], [place] [address] [phone]  [facility] [address] [place], [place] Other Locations available [phone]

## 2019-01-14 NOTE — Discharge Summary (Signed)
Marisa Bailey, is a 78 y.o. female  DOB 1940/04/08  MRN [id number].  Admission date:  01/10/2019  Admitting Physician  Bethena Roys, MD  Discharge Date:  01/14/2019   Primary MD  Lucia Gaskins, MD  Recommendations for primary care physician for things to follow:    1)Avoid ibuprofen/Advil/Aleve/Motrin/Goody Powders/Naproxen/BC powders/Meloxicam/Diclofenac/Indomethacin and other Nonsteroidal anti-inflammatory medications as these will make you more likely to bleed and can cause stomach ulcers, can also cause Kidney problems.   2)Patient needs continuous Oxygen  at 2 L/min continuously via nasal cannula with humidifier, with gaseous portability and conserving device  3) follow-up with a primary care physician in about a week for recheck and reevaluation  4)Very low-salt diet advised 5)Weigh yourself daily, call if you gain more than 3 pounds in 1 day or more than 5 pounds in 1 week as your diuretic medications may need to be adjusted 6)Limit your Fluid  intake to no more than 60 ounces (1.8 Liters) per day  Admission Diagnosis  Acute respiratory failure with hypoxia ([place]) [J96.01]   Discharge Diagnosis  Acute respiratory failure with hypoxia ([place]) [J96.01]    Principal Problem:   Acute Respiratory failure with hypoxia ([place]) Active Problems:   COPD (chronic obstructive pulmonary disease) ([place])   Hypertension   Tobacco abuse      Past Medical History:  Diagnosis Date  . Bronchitis   . COPD (chronic obstructive pulmonary disease) ([place])   . Hypertension     Past Surgical History:  Procedure Laterality Date  . ENUCLEATION         HPI  from the history and physical done on the day of admission:    Chief Complaint: Shortness of breath  HPI: Marisa Bailey is a 78 y.o. female with medical history significant for COPD, CHF, hypertension.  Presented to the ED with complaints of  sudden onset of difficulty breathing that started this morning.  Patient is a poor historian and hard of hearing.  She reports cough, productive of whitish sputum.  Denies fever or chills.  No chest pain.  She does not check her weight daily.  Reports her abdomen looks full, but likely this is normal for her.  She does not think she has added weight.,  Though she does not check her weights daily.  Denies leg swelling.  She is not on home O2.  ED Course: O2 sats 57% on room air, patient was placed on nonrebreather at 15 L.  Later weaned down to 5 L O2 with sats maintaining > 97%.  BNP 297.  HST 5 > 6.  WBC 7.4.  Covid test negative.  Normal lactic acid 1.1.  Portable chest x-ray showed patchy bibasilar airspace disease.  Subsequent CTA chest-mild bilateral lower lobe bronchiectasis and subpleural reticular opacities suggestive of scarring, bilateral smooth interlobular septal thickening which may represent mild component of edema. IV Lasix 80 mg x 1 given.  Solu-Medrol, bronchodilators given.  Hospitalist to admit for acute respiratory failure     Hospital Course:  Brief Summary:- 78 y.o.femalewith medical history significant forCOPD, CHF, hypertension.Presented to the ED with complaints of sudden onset of difficulty breathing admitted 01/10/2019 with acute hypoxic respiratory failure due to presumed CHF exacerbation and bronchiectasis flareup  A/p 1)Acute hypoxic respiratory failure--suspect component of CHF and bronchiectasis -- -overall respiratory status improved, patient does remain weaned down to 2 L of oxygen with activity, does not really need much O2 at rest, -Patient Saturations on Room Air at Rest =92 % on RA Patient Saturations on Room Air while Ambulating =85 % Patient Saturations on2Liters of oxygen while Ambulating = 96 % --- Discharge home on home oxygen, -Omnicef for bronchitis/bronchiectasis flareup -Lasix for CHF and prednisone for bronchitic component   2)HFpEF--patient with chronic diastolic dysfunction CHF -CTA chest suggested possible pulmonary edema -Admitted with acute on chronic diastolic dysfunction CHF -Last known EF 50 to 55%  based on echo from 05/02/2018 -diuresed well with IV Lasix  -Fluid balance remains negative, no accurate with available -okay to discharge home on p.o. Lasix  3) acute COPD exacerbation/bronchiectasis--treated with IV Solu-Medrol, mucolytics bronchodilators and azithromycin/Rocephin, CTA chest with bilateral lower lobe bronchiectasis -Oxygen requirement has improved -Discharge home on prednisone and Omnicef -  4)HTN--- improved, okay to discharge on olmesartan and amlodipine   Discharge Condition: Stable, ambulating, requires oxygen with activity  Follow UP--- PCP as advised   Consults obtained - na  Diet and Activity recommendation:  As advised  Discharge Instructions    Discharge Instructions    Call MD for:  difficulty breathing, headache or visual disturbances   Complete by: As directed    Call MD for:  persistant dizziness or light-headedness   Complete by: As directed    Call MD for:  persistant nausea and vomiting   Complete by: As directed    Call MD for:  temperature >100.4   Complete by: As directed    Diet - low sodium heart healthy   Complete by: As directed    Discharge instructions   Complete by: As directed    1)Avoid ibuprofen/Advil/Aleve/Motrin/Goody Powders/Naproxen/BC powders/Meloxicam/Diclofenac/Indomethacin and other Nonsteroidal anti-inflammatory medications as these will make you more likely to bleed and can cause stomach ulcers, can also cause Kidney problems.   2)Patient needs continuous Oxygen  at 2 L/min continuously via nasal cannula with humidifier, with gaseous portability and conserving device  3) follow-up with a primary care physician in about a week for recheck and reevaluation  4)Very low-salt diet advised 5)Weigh yourself daily, call if you gain more  than 3 pounds in 1 day or more than 5 pounds in 1 week as your diuretic medications may need to be adjusted 6)Limit your Fluid  intake to no more than 60 ounces (1.8 Liters) per day   Increase activity slowly   Complete by: As directed        Discharge Medications     Allergies as of 01/14/2019      Reactions   Penicillins Rash   Has patient had a PCN reaction causing immediate rash, facial/tongue/throat swelling, SOB or lightheadedness with hypotension: {Yes/No:[id number] Has patient had a PCN reaction causing severe rash involving mucus membranes or skin necrosis: Yes Has patient had a PCN reaction that required hospitalization No Has patient had a PCN reaction occurring within the last 10 years: No If all of the above answers are "NO", then may proceed with Cephalosporin use. **Has tolerated keflex      Medication List    TAKE these medications   acetaminophen 325 MG tablet  Commonly known as: TYLENOL Take 2 tablets (650 mg total) by mouth every 6 (six) hours as needed for mild pain (or Fever >/= 101).   amLODipine 10 MG tablet Commonly known as: NORVASC Take 1 tablet (10 mg total) by mouth daily.   aspirin EC 81 MG tablet Take 81 mg by mouth at bedtime.   budesonide-formoterol 160-4.5 MCG/ACT inhaler Commonly known as: Symbicort Inhale 2 puffs into the lungs 2 (two) times daily.   cefdinir 300 MG capsule Commonly known as: OMNICEF Take 1 capsule (300 mg total) by mouth 2 (two) times daily for 7 days.   folic acid 1 MG tablet Commonly known as: FOLVITE Take 1 tablet (1 mg total) by mouth daily.   furosemide 40 MG tablet Commonly known as: LASIX Take 1 tablet (40 mg total) by mouth See admin instructions. Take 1 tablet (40 mg) every Morning and take half tablet (20 mg) every evening of fluid and  heart What changed:   medication strength  how much to take  when to take this  additional instructions   guaiFENesin 600 MG 12 hr tablet Commonly known as: MUCINEX  Take 1 tablet (600 mg total) by mouth 2 (two) times daily for 10 days.   olmesartan 40 MG tablet Commonly known as: BENICAR Take 1 tablet (40 mg total) by mouth daily.   predniSONE 20 MG tablet Commonly known as: Deltasone Take 2 tablets (40 mg total) by mouth daily with breakfast.   ProAir HFA 108 (90 Base) MCG/ACT inhaler Generic drug: albuterol Inhale 1-2 puffs into the lungs every 4 (four) hours as needed for wheezing or shortness of breath. What changed: reasons to take this   albuterol (2.5 MG/3ML) 0.083% nebulizer solution Commonly known as: PROVENTIL Take 3 mLs (2.5 mg total) by nebulization every 6 (six) hours as needed for wheezing or shortness of breath. What changed: You were already taking a medication with the same name, and this prescription was added. Make sure you understand how and when to take each.            Durable Medical Equipment  (From admission, onward)         Start     Ordered   01/14/19 1305  For home use only DME Nebulizer/meds  Once    Comments: Dx-COPD with acute hypoxic respiratory failure  Question Answer Comment  Patient needs a nebulizer to treat with the following condition COPD (chronic obstructive pulmonary disease) ([place])   Length of Need Lifetime      01/14/19 1305   01/14/19 1302  For home use only DME Nebulizer machine  Once    Comments: Use nebulizer machine and solutions as directed  Question Answer Comment  Patient needs a nebulizer to treat with the following condition COPD (chronic obstructive pulmonary disease) ([place])   Length of Need Lifetime      01/14/19 1303   01/14/19 1302  For home use only DME oxygen  Once    Comments: SATURATION QUALIFICATIONS: (Thisnote is usedto comply with regulatory documentation for home oxygen)  Patient Saturations on Room Air at Rest =92 % on RA  Patient Saturations on Room Air while Ambulating =85 %  Patient Saturations on2Liters of oxygen while Ambulating = 96 %    Patient needs continuous O2 at 2 L/min continuously via nasal cannula with humidifier, with gaseous portability and conserving device  Dx-COPD with acute hypoxic respiratory failure  Question Answer Comment  Length of Need Lifetime   Mode or (Route) Nasal  cannula   Liters per Minute 2   Frequency Continuous (stationary and portable oxygen unit needed)   Oxygen conserving device Yes   Oxygen delivery system Gas      01/14/19 1303          Major procedures and Radiology Reports - PLEASE review detailed and final reports for all details, in brief -   Cxr  Result Date: 01/13/2019 CLINICAL DATA:  Cough, shortness of breath EXAM: CHEST - 2 VIEW COMPARISON:  None. FINDINGS: There is bilateral interstitial thickening. There is mild bibasilar atelectasis. There is no focal consolidation. There is no pleural effusion or pneumothorax. There is stable cardiomegaly. There is no acute osseous abnormality. IMPRESSION: Cardiomegaly with mild pulmonary vascular congestion. Electronically Signed   By: Kathreen Devoid   On: 01/13/2019 13:57   Ct Angio Chest Pe W And/or Wo Contrast  Result Date: 01/10/2019 CLINICAL DATA:  Worsening shortness of breath. Evaluate for pulmonary embolus. EXAM: CT ANGIOGRAPHY CHEST WITH CONTRAST TECHNIQUE: Multidetector CT imaging of the chest was performed using the standard protocol during bolus administration of intravenous contrast. Multiplanar CT image reconstructions and MIPs were obtained to evaluate the vascular anatomy. CONTRAST:  174m OMNIPAQUE IOHEXOL 350 MG/ML SOLN COMPARISON:  P chest 01/24/2018 FINDINGS: Cardiovascular: Heart is mildly enlarged. Trace fluid superior pericardial recess. Thoracic aortic and coronary arterial vascular calcifications. Adequate opacification of the pulmonary arterial system. Motion artifact limits evaluation. No definite intraluminal filling defect identified to suggest acute pulmonary embolus. Mediastinum/Nodes: No enlarged axillary,  mediastinal or hilar lymphadenopathy. Normal appearance of the esophagus. Lungs/Pleura: Central airways are patent. Dependent atelectasis within the bilateral lower lobes. Smooth interlobular septal thickening. Trace effusions bilaterally. Bilateral lower lobe bronchiectasis. No pneumothorax. Upper Abdomen: No acute process. Musculoskeletal: Thoracic spine degenerative changes. No aggressive or acute appearing osseous lesions. Review of the MIP images confirms the above findings. IMPRESSION: No evidence for acute pulmonary embolus. Bilateral lower lobe bronchiectasis and subpleural reticular opacities suggestive of scarring. Bilateral smooth interlobular septal thickening which may represent mild component of edema. Aortic Atherosclerosis (ICD10-I70.0). Electronically Signed   By: DLovey NewcomerM.D.   On: 01/10/2019 16:00   Dg Chest Port 1 View  Result Date: 01/10/2019 CLINICAL DATA:  Shortness of breath with decreased oxygen saturation EXAM: PORTABLE CHEST 1 VIEW COMPARISON:  August 01, 2018 FINDINGS: There is airspace opacity in both lung bases with atelectatic change in each mid lung. Upper lung regions elsewhere clear. There is cardiomegaly with pulmonary vascularity within normal limits. No adenopathy. No bone lesions. IMPRESSION: Patchy bibasilar airspace disease with midlung atelectatic change. Stable cardiomegaly. No adenopathy. Followup PA and lateral chest radiographs is recommended in 3-4 weeks following trial of antibiotic therapy to ensure resolution and exclude underlying malignancy. Electronically Signed   By: WLowella GripIII M.D.   On: 01/10/2019 12:41    Micro Results    Recent Results (from the past 240 hour(s))  SARS CORONAVIRUS 2 (TAT 6-24 HRS) Nasopharyngeal Nasopharyngeal Swab     Status: None   Collection Time: 01/10/19  1:18 PM   Specimen: Nasopharyngeal Swab  Result Value Ref Range Status   SARS Coronavirus 2 NEGATIVE NEGATIVE Final    Comment: (NOTE) SARS-CoV-2 target  nucleic acids are NOT DETECTED. The SARS-CoV-2 RNA is generally detectable in upper and lower respiratory specimens during the acute phase of infection. Negative results do not preclude SARS-CoV-2 infection, do not rule out co-infections with other pathogens, and should not be used as the sole basis for treatment or other  patient management decisions. Negative results must be combined with clinical observations, patient history, and epidemiological information. The expected result is Negative. Fact Sheet for Patients: SugarRoll.be Fact Sheet for Healthcare Providers: https://www.woods-mathews.com/ This test is not yet approved or cleared by the [place] FDA and  has been authorized for detection and/or diagnosis of SARS-CoV-2 by FDA under an Emergency Use Authorization (EUA). This EUA will remain  in effect (meaning this test can be used) for the duration of the COVID-19 declaration under Section 56 4(b)(1) of the Act, 21 U.S.C. section 360bbb-3(b)(1), unless the authorization is terminated or revoked sooner. Performed at [facility], [place] [address], [place], [place] [address]   Culture, blood (routine x 2)     Status: None (Preliminary result)   Collection Time: 01/10/19  2:14 PM   Specimen: Left Antecubital; Blood  Result Value Ref Range Status   Specimen Description   Final    LEFT ANTECUBITAL BOTTLES DRAWN AEROBIC AND ANAEROBIC   Special Requests Blood Culture adequate volume  Final   Culture   Final    NO GROWTH 4 DAYS Performed at [facility], [address], [place], [place] [address]    Report Status PENDING  Incomplete  Culture, blood (routine x 2)     Status: None (Preliminary result)   Collection Time: 01/10/19  2:16 PM   Specimen: BLOOD LEFT HAND  Result Value Ref Range Status   Specimen Description BLOOD LEFT HAND BOTTLES DRAWN AEROBIC ONLY  Final   Special Requests   Final    Blood Culture results may not be  optimal due to an inadequate volume of blood received in culture bottles   Culture   Final    NO GROWTH 4 DAYS Performed at [facility], [address], [place], [place] [address]    Report Status PENDING  Incomplete       Today   Subjective    Burnadette Baskett today has no new complaints  Patient Saturations on Room Air at Rest =92 % on RA Patient Saturations on Room Air while Ambulating =85 % Patient Saturations on2Liters of oxygen while Ambulating = 96 %         No conversational dyspnea, no dyspnea at rest -No fevers no chills -Cough is improved Objective   Blood pressure 130/82, pulse 72, temperature 97.6 F (36.4 C), temperature source Oral, resp. rate 20, height _0  (1.6 m), weight 81.5 kg, SpO2 98 %.   Intake/Output Summary (Last 24 hours) at 01/14/2019 1317 Last data filed at 01/14/2019 0700 Gross per 24 hour  Intake 580 ml  Output 2200 ml  Net -1620 ml    Exam Gen:- Awake Alert, no acute distress , no conversational dyspnea HEENT:- [place].AT, No sclera icterus Nose- 2L/min Eye-right eye vision loss Neck-Supple Neck,No JVD,.  Lungs-improving air movement, no wheezing , CV- S1, S2 normal, regular Abd-  +ve B.Sounds, Abd Soft, No tenderness,    Extremity/Skin:- No  edema,   good pulses Psych-affect is appropriate, oriented x3 Neuro-no new focal deficits, no tremors    Data Review   CBC w Diff:  Lab Results  Component Value Date   WBC 7.4 01/10/2019   HGB 13.9 01/10/2019   HCT 45.8 01/10/2019   PLT 271 01/10/2019   LYMPHOPCT 13 01/10/2019   MONOPCT 4 01/10/2019   EOSPCT 5 01/10/2019   BASOPCT 1 01/10/2019    CMP:  Lab Results  Component Value Date   NA 140 01/14/2019   K 4.0  01/14/2019   CL 93 (L) 01/14/2019   CO2 33 (H) 01/14/2019   BUN 32 (H) 01/14/2019   CREATININE 0.77 01/14/2019   PROT 7.2 08/01/2018   ALBUMIN 3.4 (L) 08/01/2018   BILITOT 0.9 08/01/2018   ALKPHOS 85 08/01/2018   AST 16 08/01/2018   ALT 16 08/01/2018  .    Total Discharge time is about 33 minutes  Roxan Hockey M.D on 01/14/2019 at 1:17 PM  Go to [email or web address] -  for contact info  [facility] - Office  [phone]

## 2019-01-15 LAB — CULTURE, BLOOD (ROUTINE X 2)
Culture: NO GROWTH
Culture: NO GROWTH
Special Requests: ADEQUATE

## 2019-03-21 ENCOUNTER — Ambulatory Visit: Payer: Medicare Other | Attending: Internal Medicine

## 2019-03-21 ENCOUNTER — Other Ambulatory Visit: Payer: Self-pay

## 2019-03-21 DIAGNOSIS — Z23 Encounter for immunization: Secondary | ICD-10-CM

## 2019-03-21 NOTE — Progress Notes (Signed)
   Covid-19 Vaccination Clinic  Name:  MONIA TIMMERS    MRN: [id number] DOB: 1941-01-27  03/21/2019  Ms. Dinger was observed post Covid-19 immunization for 15 minutes without incidence. She was provided with Vaccine Information Sheet and instruction to access the V-Safe system.   Ms. Sandoval was instructed to call 911 with any severe reactions post vaccine: [place] Kitchen Difficulty breathing  . Swelling of your face and throat  . A fast heartbeat  . A bad rash all over your body  . Dizziness and weakness    Immunizations Administered    Name Date Dose VIS Date Route   Moderna COVID-19 Vaccine 03/21/2019  3:30 PM 0.5 mL 01/12/2019 Intramuscular   Manufacturer: Moderna   Lot: [id number]   [place]: [id number]

## 2019-04-21 ENCOUNTER — Ambulatory Visit: Payer: Medicare Other | Attending: Internal Medicine

## 2019-04-21 DIAGNOSIS — Z23 Encounter for immunization: Secondary | ICD-10-CM

## 2019-04-21 NOTE — Progress Notes (Signed)
COVID-19 Vaccine Information can be found at: [email or web address] For questions related to vaccine distribution or appointments, please email [email or web address] or call [phone].

## 2019-04-21 NOTE — Progress Notes (Signed)
   Covid-19 Vaccination Clinic  Name:  Marisa Bailey    MRN: [id number] DOB: Jun 29, 1940  04/21/2019  [name] was observed post Covid-19 immunization for 15 minutes without incident. She was provided with Vaccine Information Sheet and instruction to access the V-Safe system.   [name] was instructed to call 911 with any severe reactions post vaccine: Marland Kitchen Difficulty breathing  . Swelling of face and throat  . A fast heartbeat  . A bad rash all over body  . Dizziness and weakness   Immunizations Administered    Name Date Dose VIS Date Route   Moderna COVID-19 Vaccine 04/21/2019  2:02 PM 0.5 mL 01/12/2019 Intramuscular   Manufacturer: Moderna   Lot: [id number]   [place]: [id number]

## 2019-05-26 ENCOUNTER — Inpatient Hospital Stay (HOSPITAL_COMMUNITY)
Admission: EM | Admit: 2019-05-26 | Discharge: 2019-05-28 | DRG: 190 | Disposition: A | Discharge status: Home / Self Care | Payer: Medicare Other | Attending: Family Medicine | Admitting: Family Medicine

## 2019-05-26 ENCOUNTER — Encounter (HOSPITAL_COMMUNITY): Payer: Self-pay | Admitting: Family Medicine

## 2019-05-26 ENCOUNTER — Other Ambulatory Visit: Payer: Self-pay

## 2019-05-26 ENCOUNTER — Inpatient Hospital Stay (HOSPITAL_COMMUNITY): Payer: Medicare Other

## 2019-05-26 ENCOUNTER — Emergency Department (HOSPITAL_COMMUNITY): Payer: Medicare Other

## 2019-05-26 DIAGNOSIS — Z7982 Long term (current) use of aspirin: Secondary | ICD-10-CM | POA: Diagnosis not present

## 2019-05-26 DIAGNOSIS — J9691 Respiratory failure, unspecified with hypoxia: Secondary | ICD-10-CM | POA: Diagnosis present

## 2019-05-26 DIAGNOSIS — Z720 Tobacco use: Secondary | ICD-10-CM | POA: Diagnosis present

## 2019-05-26 DIAGNOSIS — J9622 Acute and chronic respiratory failure with hypercapnia: Secondary | ICD-10-CM | POA: Diagnosis present

## 2019-05-26 DIAGNOSIS — J449 Chronic obstructive pulmonary disease, unspecified: Secondary | ICD-10-CM | POA: Diagnosis not present

## 2019-05-26 DIAGNOSIS — Z20822 Contact with and (suspected) exposure to covid-19: Secondary | ICD-10-CM | POA: Diagnosis present

## 2019-05-26 DIAGNOSIS — Z880 Allergy status to penicillin: Secondary | ICD-10-CM

## 2019-05-26 DIAGNOSIS — Z7951 Long term (current) use of inhaled steroids: Secondary | ICD-10-CM | POA: Diagnosis not present

## 2019-05-26 DIAGNOSIS — I248 Other forms of acute ischemic heart disease: Secondary | ICD-10-CM | POA: Diagnosis present

## 2019-05-26 DIAGNOSIS — J9621 Acute and chronic respiratory failure with hypoxia: Secondary | ICD-10-CM

## 2019-05-26 DIAGNOSIS — I5033 Acute on chronic diastolic (congestive) heart failure: Secondary | ICD-10-CM | POA: Diagnosis present

## 2019-05-26 DIAGNOSIS — F17210 Nicotine dependence, cigarettes, uncomplicated: Secondary | ICD-10-CM | POA: Diagnosis present

## 2019-05-26 DIAGNOSIS — J441 Chronic obstructive pulmonary disease with (acute) exacerbation: Principal | ICD-10-CM | POA: Diagnosis present

## 2019-05-26 DIAGNOSIS — G9341 Metabolic encephalopathy: Secondary | ICD-10-CM | POA: Diagnosis present

## 2019-05-26 DIAGNOSIS — R778 Other specified abnormalities of plasma proteins: Secondary | ICD-10-CM | POA: Diagnosis not present

## 2019-05-26 DIAGNOSIS — Z7952 Long term (current) use of systemic steroids: Secondary | ICD-10-CM

## 2019-05-26 DIAGNOSIS — H5461 Unqualified visual loss, right eye, normal vision left eye: Secondary | ICD-10-CM | POA: Diagnosis present

## 2019-05-26 DIAGNOSIS — I361 Nonrheumatic tricuspid (valve) insufficiency: Secondary | ICD-10-CM

## 2019-05-26 DIAGNOSIS — I110 Hypertensive heart disease with heart failure: Secondary | ICD-10-CM | POA: Diagnosis present

## 2019-05-26 DIAGNOSIS — I10 Essential (primary) hypertension: Secondary | ICD-10-CM | POA: Diagnosis present

## 2019-05-26 DIAGNOSIS — J9611 Chronic respiratory failure with hypoxia: Secondary | ICD-10-CM | POA: Diagnosis present

## 2019-05-26 DIAGNOSIS — Z79899 Other long term (current) drug therapy: Secondary | ICD-10-CM

## 2019-05-26 LAB — BASIC METABOLIC PANEL
Anion gap: 11 (ref 5–15)
BUN: 17 mg/dL (ref 8–23)
CO2: 27 mmol/L (ref 22–32)
Calcium: 8.4 mg/dL — ABNORMAL LOW (ref 8.9–10.3)
Chloride: 101 mmol/L (ref 98–111)
Creatinine, Ser: 0.97 mg/dL (ref 0.44–1.00)
GFR calc Af Amer: >60 mL/min (ref >60)
GFR calc non Af Amer: 56 mL/min — ABNORMAL LOW (ref >60)
Glucose, Bld: 110 mg/dL — ABNORMAL HIGH (ref 70–99)
Potassium: 4 mmol/L (ref 3.5–5.1)
Sodium: 139 mmol/L (ref 135–145)

## 2019-05-26 LAB — BRAIN NATRIURETIC PEPTIDE: B Natriuretic Peptide: 327 pg/mL — ABNORMAL HIGH (ref 0.0–100.0)

## 2019-05-26 LAB — CBC WITH DIFFERENTIAL/PLATELET
Abs Immature Granulocytes: 0.05 10*3/uL (ref 0.00–0.07)
Basophils Absolute: 0 10*3/uL (ref 0.0–0.1)
Basophils Relative: 1 %
Eosinophils Absolute: 0.3 10*3/uL (ref 0.0–0.5)
Eosinophils Relative: 6 %
HCT: 42.8 % (ref 36.0–46.0)
Hemoglobin: 12.3 g/dL (ref 12.0–15.0)
Immature Granulocytes: 1 %
Lymphocytes Relative: 21 %
Lymphs Abs: 1.2 10*3/uL (ref 0.7–4.0)
MCH: 28 pg (ref 26.0–34.0)
MCHC: 28.7 g/dL — ABNORMAL LOW (ref 30.0–36.0)
MCV: 97.3 fL (ref 80.0–100.0)
Monocytes Absolute: 0.4 10*3/uL (ref 0.1–1.0)
Monocytes Relative: 7 %
Neutro Abs: 3.7 10*3/uL (ref 1.7–7.7)
Neutrophils Relative %: 64 %
Platelets: 259 10*3/uL (ref 150–400)
RBC: 4.4 MIL/uL (ref 3.87–5.11)
RDW: 16.2 % — ABNORMAL HIGH (ref 11.5–15.5)
WBC: 5.7 10*3/uL (ref 4.0–10.5)
nRBC: 1.1 % — ABNORMAL HIGH (ref 0.0–0.2)

## 2019-05-26 LAB — RESPIRATORY PANEL BY RT PCR (FLU A&B, COVID)
Influenza A by PCR: NEGATIVE
Influenza B by PCR: NEGATIVE
SARS Coronavirus 2 by RT PCR: NEGATIVE

## 2019-05-26 LAB — TROPONIN I (HIGH SENSITIVITY)
Troponin I (High Sensitivity): 254 ng/L (ref <18)
Troponin I (High Sensitivity): 266 ng/L (ref <18)
Troponin I (High Sensitivity): 224 ng/L (ref <18)
Troponin I (High Sensitivity): 208 ng/L (ref <18)

## 2019-05-26 LAB — BLOOD GAS, ARTERIAL
Acid-Base Excess: 3 mmol/L — ABNORMAL HIGH (ref 0.0–2.0)
Acid-Base Excess: 2.7 mmol/L — ABNORMAL HIGH (ref 0.0–2.0)
Bicarbonate: 25.8 mmol/L (ref 20.0–28.0)
Bicarbonate: 25.4 mmol/L (ref 20.0–28.0)
FIO2: 40
FIO2: 30
O2 Saturation: 97.9 %
O2 Saturation: 90.3 %
Patient temperature: 37
Patient temperature: 36.9
pCO2 arterial: 63 mmHg — ABNORMAL HIGH (ref 32.0–48.0)
pCO2 arterial: 59.2 mmHg — ABNORMAL HIGH (ref 32.0–48.0)
pH, Arterial: 7.287 — ABNORMAL LOW (ref 7.350–7.450)
pH, Arterial: 7.303 — ABNORMAL LOW (ref 7.350–7.450)
pO2, Arterial: 133 mmHg — ABNORMAL HIGH (ref 83.0–108.0)
pO2, Arterial: 67.9 mmHg — ABNORMAL LOW (ref 83.0–108.0)

## 2019-05-26 LAB — POC SARS CORONAVIRUS 2 AG -  ED: SARS Coronavirus 2 Ag: NEGATIVE

## 2019-05-26 LAB — BLOOD GAS, VENOUS
Acid-Base Excess: 3.8 mmol/L — ABNORMAL HIGH (ref 0.0–2.0)
Bicarbonate: 24.6 mmol/L (ref 20.0–28.0)
FIO2: 40
O2 Saturation: 75.9 %
Patient temperature: 37.2
pCO2, Ven: 69.3 mmHg — ABNORMAL HIGH (ref 44.0–60.0)
pH, Ven: 7.264 (ref 7.250–7.430)
pO2, Ven: 50.9 mmHg — ABNORMAL HIGH (ref 32.0–45.0)

## 2019-05-26 LAB — ECHOCARDIOGRAM COMPLETE
Height: 67 in
Weight: 2952.4 oz

## 2019-05-26 MED ORDER — ASPIRIN EC 81 MG PO TBEC
81.0000 mg | DELAYED_RELEASE_TABLET | Freq: Every day | ORAL | Status: DC
  Administered 2019-05-27 – 2019-05-28 (×2): 81 mg via ORAL
  Filled 2019-05-26 (×2): qty 1

## 2019-05-26 MED ORDER — FOLIC ACID 1 MG PO TABS
1.0000 mg | ORAL_TABLET | Freq: Every day | ORAL | Status: DC
  Administered 2019-05-27 – 2019-05-28 (×2): 1 mg via ORAL
  Filled 2019-05-26 (×2): qty 1

## 2019-05-26 MED ORDER — CHLORHEXIDINE GLUCONATE CLOTH 2 % EX PADS: 6.0000 | MEDICATED_PAD | Freq: Every day | CUTANEOUS | Status: DC

## 2019-05-26 MED ORDER — POLYETHYLENE GLYCOL 3350 17 G PO PACK: 17.0000 g | PACK | Freq: Every day | ORAL | Status: DC | PRN

## 2019-05-26 MED ORDER — AMLODIPINE BESYLATE 5 MG PO TABS
10.0000 mg | ORAL_TABLET | Freq: Every day | ORAL | Status: DC
  Administered 2019-05-27 – 2019-05-28 (×2): 10 mg via ORAL
  Filled 2019-05-26 (×2): qty 2

## 2019-05-26 MED ORDER — TRAZODONE HCL 50 MG PO TABS: 50.0000 mg | ORAL_TABLET | Freq: Every evening | ORAL | Status: DC | PRN

## 2019-05-26 MED ORDER — AEROCHAMBER Z-STAT PLUS/MEDIUM MISC
Status: AC
  Administered 2019-05-26: 1
  Filled 2019-05-26: qty 1

## 2019-05-26 MED ORDER — DOXYCYCLINE HYCLATE 100 MG PO TABS
100.0000 mg | ORAL_TABLET | Freq: Two times a day (BID) | ORAL | Status: DC
  Administered 2019-05-27 – 2019-05-28 (×3): 100 mg via ORAL
  Filled 2019-05-26 (×3): qty 1

## 2019-05-26 MED ORDER — ALBUTEROL SULFATE (2.5 MG/3ML) 0.083% IN NEBU
2.5000 mg | INHALATION_SOLUTION | RESPIRATORY_TRACT | Status: DC | PRN
  Administered 2019-05-27: 09:00:00 2.5 mg via RESPIRATORY_TRACT
  Filled 2019-05-26: qty 3

## 2019-05-26 MED ORDER — CHLORHEXIDINE GLUCONATE 0.12 % MT SOLN
15.0000 mL | Freq: Two times a day (BID) | OROMUCOSAL | Status: DC
  Administered 2019-05-26 – 2019-05-28 (×4): 15 mL via OROMUCOSAL
  Filled 2019-05-26 (×4): qty 15

## 2019-05-26 MED ORDER — METHYLPREDNISOLONE SODIUM SUCC 40 MG IJ SOLR
40.0000 mg | Freq: Three times a day (TID) | INTRAMUSCULAR | Status: DC
  Administered 2019-05-26 – 2019-05-28 (×5): 40 mg via INTRAVENOUS
  Filled 2019-05-26 (×5): qty 1

## 2019-05-26 MED ORDER — ALBUTEROL SULFATE (2.5 MG/3ML) 0.083% IN NEBU: 5.0000 mg | INHALATION_SOLUTION | Freq: Once | RESPIRATORY_TRACT | Status: DC

## 2019-05-26 MED ORDER — CHLORHEXIDINE GLUCONATE CLOTH 2 % EX PADS
6.0000 | MEDICATED_PAD | Freq: Every day | CUTANEOUS | Status: DC
  Administered 2019-05-27 – 2019-05-28 (×2): 6 via TOPICAL

## 2019-05-26 MED ORDER — ALBUTEROL SULFATE HFA 108 (90 BASE) MCG/ACT IN AERS
4.0000 | INHALATION_SPRAY | Freq: Once | RESPIRATORY_TRACT | Status: AC
  Administered 2019-05-26: 4 via RESPIRATORY_TRACT
  Filled 2019-05-26: qty 6.7

## 2019-05-26 MED ORDER — METHYLPREDNISOLONE SODIUM SUCC 125 MG IJ SOLR
125.0000 mg | Freq: Once | INTRAMUSCULAR | Status: AC
  Administered 2019-05-26: 125 mg via INTRAVENOUS
  Filled 2019-05-26: qty 2

## 2019-05-26 MED ORDER — ONDANSETRON HCL 4 MG PO TABS: 4.0000 mg | ORAL_TABLET | Freq: Four times a day (QID) | ORAL | Status: DC | PRN

## 2019-05-26 MED ORDER — FUROSEMIDE 10 MG/ML IJ SOLN
40.0000 mg | Freq: Two times a day (BID) | INTRAMUSCULAR | Status: DC
  Administered 2019-05-26 – 2019-05-27 (×3): 40 mg via INTRAVENOUS
  Filled 2019-05-26 (×4): qty 4

## 2019-05-26 MED ORDER — HEPARIN SODIUM (PORCINE) 5000 UNIT/ML IJ SOLN
5000.0000 [IU] | Freq: Three times a day (TID) | INTRAMUSCULAR | Status: DC
  Administered 2019-05-26 – 2019-05-28 (×5): 5000 [IU] via SUBCUTANEOUS
  Filled 2019-05-26 (×5): qty 1

## 2019-05-26 MED ORDER — ONDANSETRON HCL 4 MG/2ML IJ SOLN: 4.0000 mg | Freq: Four times a day (QID) | INTRAMUSCULAR | Status: DC | PRN

## 2019-05-26 MED ORDER — SODIUM CHLORIDE 0.9% FLUSH: 3.0000 mL | INTRAVENOUS | Status: DC | PRN

## 2019-05-26 MED ORDER — GUAIFENESIN ER 600 MG PO TB12
600.0000 mg | ORAL_TABLET | Freq: Two times a day (BID) | ORAL | Status: DC
  Administered 2019-05-27 – 2019-05-28 (×3): 600 mg via ORAL
  Filled 2019-05-26 (×3): qty 1

## 2019-05-26 MED ORDER — SODIUM CHLORIDE 0.9 % IV SOLN: 250.0000 mL | INTRAVENOUS | Status: DC | PRN

## 2019-05-26 MED ORDER — MOMETASONE FURO-FORMOTEROL FUM 200-5 MCG/ACT IN AERO
2.0000 | INHALATION_SPRAY | Freq: Two times a day (BID) | RESPIRATORY_TRACT | Status: DC
  Administered 2019-05-27 – 2019-05-28 (×3): 2 via RESPIRATORY_TRACT
  Filled 2019-05-26: qty 8.8

## 2019-05-26 MED ORDER — ASPIRIN 81 MG PO CHEW
324.0000 mg | CHEWABLE_TABLET | Freq: Once | ORAL | Status: AC
  Administered 2019-05-26: 11:00:00 324 mg via ORAL
  Filled 2019-05-26: qty 4

## 2019-05-26 MED ORDER — SODIUM CHLORIDE 0.9% FLUSH
3.0000 mL | Freq: Two times a day (BID) | INTRAVENOUS | Status: DC
  Administered 2019-05-26 – 2019-05-28 (×4): 3 mL via INTRAVENOUS

## 2019-05-26 MED ORDER — ORAL CARE MOUTH RINSE
15.0000 mL | Freq: Two times a day (BID) | OROMUCOSAL | Status: DC
  Administered 2019-05-27 (×2): 15 mL via OROMUCOSAL

## 2019-05-26 NOTE — ED Notes (Signed)
Placed on BiPAP by Abigail Butts, PT.  14/6 rate 8, FIO2 40%

## 2019-05-26 NOTE — H&P (Signed)
Patient Demographics:    Marisa Bailey, is a 79 y.o. female  MRN: [id number]   DOB - 18-Feb-1940  Admit Date - 05/26/2019  Outpatient Primary MD for the patient is Lucia Gaskins, MD   Assessment & Plan:    Principal Problem:   Acute on chronic respiratory failure with hypoxia and hypercapnia (HCC) Active Problems:   Acute Respiratory failure with hypoxia (HCC)   COPD (chronic obstructive pulmonary disease) (HCC)   Hypertension   Tobacco abuse   A/p 1)Acute Hypoxic and Hypercapnic Respiratory failure-- -very disoriented and lethargic --suspect component of CHF and bronchiectasis / COPD-- -O2 sat was as low as 65% in the ED patient initially required a nonrebreather bag and was placed on BiPAP -patient was previously on home O2 but she quit using it -Continues to smoke and was wheezy in the ED -Treat empirically with IV Solu-Medrol, bronchodilators, mucolytics and doxycycline for presumed COPD exacerbation --Continue BiPAP use --Repeat ABG -  2)HFpEF--patient with chronic diastolic dysfunction CHF  -Elevated troponin noted -Repeat echo on 05/26/2019 with - EF of 50%, with global hypokinesis and mild LVH, and grade 1 diastolic dysfunction -Last known EF 50 to55%based on echo from 08/02/2018 - Elevated troponin noted--Troponin 254>>266>>224 --Gentle diuresis with Lasix, - Aspirin as ordered, --BNP is 327 -Await official cardiology consult  3)Acute COPD exacerbation/bronchiectasis--treated with IV Solu-Medrol, mucolytics bronchodilators and -doxycycline as ordered -Please see #1 above - 4)HTN---  hold olmesartan , continue amlodipine  5) tobacco abuse--smoking cessation advised,, nicotine patch is ordered  6) acute metabolic encephalopathy--- secondary to #1, 2 and 3 above -Repeat ABG on BiPAP  pending  With History of - Reviewed by me  Past Medical History:  Diagnosis Date  . Bronchitis   . COPD (chronic obstructive pulmonary disease) ([place])   . Hypertension       Past Surgical History:  Procedure Laterality Date  . ENUCLEATION      Chief Complaint  Patient presents with  . Hypoxia       HPI:    Marisa Bailey  is a 79 y.o. female smoker with medical history significant forright eye vision loss, COPD, dCHF, HTN, as well as history of chronic hypoxic respiratory failure but noncompliant with oxygen who presents to the ED with significant shortness of breath, tachypnea, labored breathing and wheezing-- -O2 sat was as low as 65% in the ED patient initially required a nonrebreather bag and was placed on BiPAP--- very disoriented and lethargic -History is limited by patient denies fever -She has had some cough at times productive -Continues to smoke  -Patient was previously on home O2 but then she has not been using it for quite a while -Additional history obtained from female relative at bedside  --Elevated troponins noted -Mildly attended a funeral last weekend and has not felt well since then she has had some cough, and some anterior abdominal wall swelling apparently -Denies chest pains, no leg pains or pleuritic symptoms -Chest  x-ray suggestive of acute bronchitis and mild cardiomegaly Troponin 254>>266>>224--- EDP discussed elevated troponin with cardiology service recommends serial troponin -Echo with EF of 50%, with global hypokinesis and mild LVH, and grade 1 diastolic dysfunction -BNP is 327    Review of systems:    In addition to the HPI above,   A full Review of  Systems was done, all other systems reviewed are negative except as noted above in HPI , .    Social History:  Reviewed by me    Social History   Tobacco Use  . Smoking status: Current Every Day Smoker    Packs/day: 0.50    Types: Cigarettes  . Smokeless tobacco: Never Used   Substance Use Topics  . Alcohol use: No    Comment: rarely       Family History :  Reviewed by me  HTN   Home Medications:   Prior to Admission medications   Medication Sig Start Date End Date Taking? Authorizing Provider  acetaminophen (TYLENOL) 325 MG tablet Take 2 tablets (650 mg total) by mouth every 6 (six) hours as needed for mild pain (or Fever >/= 101). 01/14/19   Roxan Hockey, MD  albuterol (PROVENTIL) (2.5 MG/3ML) 0.083% nebulizer solution Take 3 mLs (2.5 mg total) by nebulization every 6 (six) hours as needed for wheezing or shortness of breath. 01/14/19   Roxan Hockey, MD  amLODipine (NORVASC) 10 MG tablet Take 1 tablet (10 mg total) by mouth daily. 01/14/19   Roxan Hockey, MD  aspirin EC 81 MG tablet Take 81 mg by mouth at bedtime.     [provider]  budesonide-formoterol (SYMBICORT) 160-4.5 MCG/ACT inhaler Inhale 2 puffs into the lungs 2 (two) times daily. 01/14/19   Roxan Hockey, MD  folic acid (FOLVITE) 1 MG tablet Take 1 tablet (1 mg total) by mouth daily. 01/14/19   Roxan Hockey, MD  furosemide (LASIX) 40 MG tablet Take 1 tablet (40 mg total) by mouth See admin instructions. Take 1 tablet (40 mg) every Morning and take half tablet (20 mg) every evening of fluid and  heart 01/14/19   Beatrice Ziehm, MD  olmesartan (BENICAR) 40 MG tablet Take 1 tablet (40 mg total) by mouth daily. 01/14/19   Roxan Hockey, MD  predniSONE (DELTASONE) 20 MG tablet Take 2 tablets (40 mg total) by mouth daily with breakfast. 01/14/19   Roxan Hockey, MD  PROAIR HFA 108 (90 Base) MCG/ACT inhaler Inhale 1-2 puffs into the lungs every 4 (four) hours as needed for wheezing or shortness of breath. 01/14/19   Roxan Hockey, MD     Allergies:     Allergies  Allergen Reactions  . Penicillins Rash    Has patient had a PCN reaction causing immediate rash, facial/tongue/throat swelling, SOB or lightheadedness with hypotension: {Yes/No:[id number] Has patient had a  PCN reaction causing severe rash involving mucus membranes or skin necrosis: Yes Has patient had a PCN reaction that required hospitalization No Has patient had a PCN reaction occurring within the last 10 years: No If all of the above answers are "NO", then may proceed with Cephalosporin use.  **Has tolerated keflex      Physical Exam:   Vitals  Blood pressure (!) 158/74, pulse 62, temperature 98.9 F (37.2 C), temperature source Oral, resp. rate (!) 24, height _0  (1.702 m), weight 83.7 kg, SpO2 97 %.  Physical Examination: General appearance -lethargic, respiratory distress  mental status -very disoriented and lethargic Eyes - sclera anicteric, right eye vision loss  Neck - supple, no JVD elevation , Chest -diminished in bases, scattered wheezes all over Heart - S1 and S2 normal, regular  Abdomen - soft, nontender, nondistended, no masses or organomegaly Neurological -lethargic and generalized weakness but no new focal deficits ,patient is confused  extremities - no pedal edema noted, intact peripheral pulses  Skin - warm, dry   Data Review:    CBC Recent Labs  Lab 05/26/19 0956  WBC 5.7  HGB 12.3  HCT 42.8  PLT 259  MCV 97.3  MCH 28.0  MCHC 28.7*  RDW 16.2*  LYMPHSABS 1.2  MONOABS 0.4  EOSABS 0.3  BASOSABS 0.0   ------------------------------------------------------------------------------------------------------------------  Chemistries  Recent Labs  Lab 05/26/19 1000  NA 139  K 4.0  CL 101  CO2 27  GLUCOSE 110*  BUN 17  CREATININE 0.97  CALCIUM 8.4*   ------------------------------------------------------------------------------------------------------------------ estimated creatinine clearance is 53.1 mL/min (by C-G formula based on SCr of 0.97 mg/dL). ------------------------------------------------------------------------------------------------------------------ No results for input(s): TSH, T4TOTAL, T3FREE, THYROIDAB in the last 72  hours.  Invalid input(s): FREET3   Coagulation profile No results for input(s): INR, PROTIME in the last 168 hours. ------------------------------------------------------------------------------------------------------------------- No results for input(s): DDIMER in the last 72 hours. -------------------------------------------------------------------------------------------------------------------  Cardiac Enzymes No results for input(s): CKMB, TROPONINI, MYOGLOBIN in the last 168 hours.  Invalid input(s): CK ------------------------------------------------------------------------------------------------------------------    Component Value Date/Time   BNP 327.0 (H) 05/26/2019 1000     ---------------------------------------------------------------------------------------------------------------  Urinalysis    Component Value Date/Time   COLORURINE YELLOW 08/01/2018 1343   APPEARANCEUR HAZY (A) 08/01/2018 1343   LABSPEC 1.011 08/01/2018 1343   PHURINE 5.0 08/01/2018 1343   GLUCOSEU NEGATIVE 08/01/2018 1343   HGBUR NEGATIVE 08/01/2018 1343   [place] 08/01/2018 1343   KETONESUR NEGATIVE 08/01/2018 1343   PROTEINUR 30 (A) 08/01/2018 1343   NITRITE NEGATIVE 08/01/2018 1343   LEUKOCYTESUR NEGATIVE 08/01/2018 1343    ----------------------------------------------------------------------------------------------------------------   Imaging Results:    DG Chest Port 1 View  Result Date: 05/26/2019 CLINICAL DATA:  79 year old female with history of shortness of breath this morning. EXAM: PORTABLE CHEST 1 VIEW COMPARISON:  Chest x-ray 01/13/2019. FINDINGS: Lung volumes are normal. Diffuse peribronchial cuffing, most evident throughout the mid to lower lungs bilaterally. No consolidative airspace disease. No pleural effusions. No evidence of pulmonary edema. Mild cardiomegaly. Upper mediastinal contours are within normal limits. Aortic atherosclerosis. IMPRESSION: 1.  Diffuse peribronchial cuffing concerning for an acute bronchitis. 2. Mild cardiomegaly. 3. Aortic atherosclerosis. Electronically Signed   By: Vinnie Langton M.D.   On: 05/26/2019 10:14   ECHOCARDIOGRAM COMPLETE  Result Date: 05/26/2019    ECHOCARDIOGRAM REPORT   Patient Name:   Nardos E Govoni Date of Exam: 05/26/2019 Medical Rec #:  [id number]      Height:       67.0 in Accession #:    [id number]     Weight:       184.5 lb Date of Birth:  August 20, 1940       BSA:          1.954 m Patient Age:    69 years       BP:           139/64 mmHg Patient Gender: F              HR:           61 bpm. Exam Location:  [facility] Procedure: 2D Echo Indications:    Abnormal ECG 794.31 / R94.31  History:  Patient has prior history of Echocardiogram examinations, most                 recent 08/02/2018. CHF, COPD; Risk Factors:Hypertension and                 Current Smoker. Acute Respiratory failure with hypoxia.  Sonographer:    Leavy Cella RDCS (AE) Referring Phys: [id number] Henessy Rohrer IMPRESSIONS  1. Left ventricular ejection fraction, by estimation, is 50%. The left ventricle has low normal function. The left ventricle demonstrates global hypokinesis. There is mild left ventricular hypertrophy. Left ventricular diastolic parameters are consistent with Grade I diastolic dysfunction (impaired relaxation). There is the interventricular septum is flattened in systole and diastole, consistent with right ventricular pressure and volume overload.  2. Right ventricular systolic function is moderately reduced. The right ventricular size is moderately enlarged. There is mildly elevated pulmonary artery systolic pressure. The estimated right ventricular systolic pressure is 57.3 mmHg.  3. Left atrial size was upper normal.  4. Right atrial size was upper normal.  5. The mitral valve is grossly normal. Trivial mitral valve regurgitation.  6. The aortic valve is tricuspid. Aortic valve regurgitation is not visualized.  7. The  inferior vena cava is dilated in size with >50% respiratory variability, suggesting right atrial pressure of 8 mmHg. FINDINGS  Left Ventricle: Left ventricular ejection fraction, by estimation, is 50%. The left ventricle has low normal function. The left ventricle demonstrates global hypokinesis. The left ventricular internal cavity size was normal in size. There is mild left ventricular hypertrophy. The interventricular septum is flattened in systole and diastole, consistent with right ventricular pressure and volume overload. Left ventricular diastolic parameters are consistent with Grade I diastolic dysfunction (impaired relaxation). Right Ventricle: The right ventricular size is moderately enlarged. No increase in right ventricular wall thickness. Right ventricular systolic function is moderately reduced. There is mildly elevated pulmonary artery systolic pressure. The tricuspid regurgitant velocity is 2.65 m/s, and with an assumed right atrial pressure of 8 mmHg, the estimated right ventricular systolic pressure is 22.0 mmHg. Left Atrium: Left atrial size was upper normal. Right Atrium: Right atrial size was upper normal. Pericardium: There is no evidence of pericardial effusion. Mitral Valve: The mitral valve is grossly normal. Mild mitral annular calcification. Trivial mitral valve regurgitation. Tricuspid Valve: The tricuspid valve is grossly normal. Tricuspid valve regurgitation is mild. Aortic Valve: The aortic valve is tricuspid. Aortic valve regurgitation is not visualized. Mild aortic valve annular calcification. Pulmonic Valve: The pulmonic valve was grossly normal. Pulmonic valve regurgitation is trivial. Aorta: The aortic root is normal in size and structure. Venous: The inferior vena cava is dilated in size with greater than 50% respiratory variability, suggesting right atrial pressure of 8 mmHg. IAS/Shunts: No atrial level shunt detected by color flow Doppler.  LEFT VENTRICLE PLAX 2D LVIDd:          4.17 cm  Diastology LVIDs:         3.17 cm  LV e' lateral:   4.70 cm/s LV PW:         1.23 cm  LV E/e' lateral: 11.8 LV IVS:        0.85 cm  LV e' medial:    3.73 cm/s LVOT diam:     1.80 cm  LV E/e' medial:  14.8 LVOT Area:     2.54 cm  RIGHT VENTRICLE RV S prime:     12.00 cm/s TAPSE (M-mode): 2.0 cm LEFT ATRIUM  Index LA diam:        3.40 cm 1.74 cm/m LA Vol (A2C):   42.3 ml 21.65 ml/m LA Vol (A4C):   66.2 ml 33.88 ml/m LA Biplane Vol: 56.8 ml 29.07 ml/m   AORTA Ao Root diam: 3.30 cm MITRAL VALVE               TRICUSPID VALVE MV Area (PHT): 3.17 cm    TR Peak grad:   28.1 mmHg MV Decel Time: 239 msec    TR Vmax:        265.00 cm/s MV E velocity: 55.30 cm/s MV A velocity: 84.00 cm/s  SHUNTS MV E/A ratio:  0.66        Systemic Diam: 1.80 cm Rozann Lesches MD Electronically signed by Rozann Lesches MD Signature Date/Time: 05/26/2019/4:10:43 PM    Final     Radiological Exams on Admission: DG Chest Port 1 View  Result Date: 05/26/2019 CLINICAL DATA:  79 year old female with history of shortness of breath this morning. EXAM: PORTABLE CHEST 1 VIEW COMPARISON:  Chest x-ray 01/13/2019. FINDINGS: Lung volumes are normal. Diffuse peribronchial cuffing, most evident throughout the mid to lower lungs bilaterally. No consolidative airspace disease. No pleural effusions. No evidence of pulmonary edema. Mild cardiomegaly. Upper mediastinal contours are within normal limits. Aortic atherosclerosis. IMPRESSION: 1. Diffuse peribronchial cuffing concerning for an acute bronchitis. 2. Mild cardiomegaly. 3. Aortic atherosclerosis. Electronically Signed   By: Vinnie Langton M.D.   On: 05/26/2019 10:14   ECHOCARDIOGRAM COMPLETE  Result Date: 05/26/2019    ECHOCARDIOGRAM REPORT   Patient Name:   Dannisha E Murph Date of Exam: 05/26/2019 Medical Rec #:  [id number]      Height:       67.0 in Accession #:    [id number]     Weight:       184.5 lb Date of Birth:  December 09, 1940       BSA:          1.954 m Patient Age:     58 years       BP:           139/64 mmHg Patient Gender: F              HR:           61 bpm. Exam Location:  [facility] Procedure: 2D Echo Indications:    Abnormal ECG 794.31 / R94.31  History:        Patient has prior history of Echocardiogram examinations, most                 recent 08/02/2018. CHF, COPD; Risk Factors:Hypertension and                 Current Smoker. Acute Respiratory failure with hypoxia.  Sonographer:    Leavy Cella RDCS (AE) Referring Phys: [id number] Jassiah Viviano IMPRESSIONS  1. Left ventricular ejection fraction, by estimation, is 50%. The left ventricle has low normal function. The left ventricle demonstrates global hypokinesis. There is mild left ventricular hypertrophy. Left ventricular diastolic parameters are consistent with Grade I diastolic dysfunction (impaired relaxation). There is the interventricular septum is flattened in systole and diastole, consistent with right ventricular pressure and volume overload.  2. Right ventricular systolic function is moderately reduced. The right ventricular size is moderately enlarged. There is mildly elevated pulmonary artery systolic pressure. The estimated right ventricular systolic pressure is 00.9 mmHg.  3. Left atrial size was upper normal.  4. Right atrial size was  upper normal.  5. The mitral valve is grossly normal. Trivial mitral valve regurgitation.  6. The aortic valve is tricuspid. Aortic valve regurgitation is not visualized.  7. The inferior vena cava is dilated in size with >50% respiratory variability, suggesting right atrial pressure of 8 mmHg. FINDINGS  Left Ventricle: Left ventricular ejection fraction, by estimation, is 50%. The left ventricle has low normal function. The left ventricle demonstrates global hypokinesis. The left ventricular internal cavity size was normal in size. There is mild left ventricular hypertrophy. The interventricular septum is flattened in systole and diastole, consistent with right ventricular  pressure and volume overload. Left ventricular diastolic parameters are consistent with Grade I diastolic dysfunction (impaired relaxation). Right Ventricle: The right ventricular size is moderately enlarged. No increase in right ventricular wall thickness. Right ventricular systolic function is moderately reduced. There is mildly elevated pulmonary artery systolic pressure. The tricuspid regurgitant velocity is 2.65 m/s, and with an assumed right atrial pressure of 8 mmHg, the estimated right ventricular systolic pressure is 50.9 mmHg. Left Atrium: Left atrial size was upper normal. Right Atrium: Right atrial size was upper normal. Pericardium: There is no evidence of pericardial effusion. Mitral Valve: The mitral valve is grossly normal. Mild mitral annular calcification. Trivial mitral valve regurgitation. Tricuspid Valve: The tricuspid valve is grossly normal. Tricuspid valve regurgitation is mild. Aortic Valve: The aortic valve is tricuspid. Aortic valve regurgitation is not visualized. Mild aortic valve annular calcification. Pulmonic Valve: The pulmonic valve was grossly normal. Pulmonic valve regurgitation is trivial. Aorta: The aortic root is normal in size and structure. Venous: The inferior vena cava is dilated in size with greater than 50% respiratory variability, suggesting right atrial pressure of 8 mmHg. IAS/Shunts: No atrial level shunt detected by color flow Doppler.  LEFT VENTRICLE PLAX 2D LVIDd:         4.17 cm  Diastology LVIDs:         3.17 cm  LV e' lateral:   4.70 cm/s LV PW:         1.23 cm  LV E/e' lateral: 11.8 LV IVS:        0.85 cm  LV e' medial:    3.73 cm/s LVOT diam:     1.80 cm  LV E/e' medial:  14.8 LVOT Area:     2.54 cm  RIGHT VENTRICLE RV S prime:     12.00 cm/s TAPSE (M-mode): 2.0 cm LEFT ATRIUM             Index LA diam:        3.40 cm 1.74 cm/m LA Vol (A2C):   42.3 ml 21.65 ml/m LA Vol (A4C):   66.2 ml 33.88 ml/m LA Biplane Vol: 56.8 ml 29.07 ml/m   AORTA Ao Root diam:  3.30 cm MITRAL VALVE               TRICUSPID VALVE MV Area (PHT): 3.17 cm    TR Peak grad:   28.1 mmHg MV Decel Time: 239 msec    TR Vmax:        265.00 cm/s MV E velocity: 55.30 cm/s MV A velocity: 84.00 cm/s  SHUNTS MV E/A ratio:  0.66        Systemic Diam: 1.80 cm Rozann Lesches MD Electronically signed by Rozann Lesches MD Signature Date/Time: 05/26/2019/4:10:43 PM    Final     DVT Prophylaxis -SCD/heparin AM Labs Ordered, also please review Full Orders  Family Communication: Admission, patients condition and plan of care including  tests being ordered have been discussed with the patient and niece at bedside who indicate understanding and agree with the plan   Code Status - Full Code  Likely DC to  Home when respiratory status improves, may need home O2  Condition   fair  Roxan Hockey M.D on 05/26/2019 at 6:04 PM Go to [email or web address] -  for contact info  [facility] - Office  [phone]

## 2019-05-26 NOTE — ED Provider Notes (Signed)
Patient with acute disorientation and some hypoxia.  Hypoxia resolved with oxygen which was able to be weaned down to 3 L eventually.  Mildly acidotic with a CO2 of 63 and was placed on BiPAP.  Ruled out for Covid.  No obvious infectious source and this is probably a combination of COPD and CHF.  She does have a troponin of 250, but this is probably demand from the significant hypoxia.  Discussed with Dr. Domenic Polite who recommends echo and cycling enzymes.  Discussed with Dr. Denton Brick for admission.  Will give albuterol and was already given steroids.  She is awake and alert though pleasantly confused at this time.  CRITICAL CARE Performed by: Ephraim Hamburger   Total critical care time: 35 minutes  Critical care time was exclusive of separately billable procedures and treating other patients.  Critical care was necessary to treat or prevent imminent or life-threatening deterioration.  Critical care was time spent personally by me on the following activities: development of treatment plan with patient and/or surrogate as well as nursing, discussions with consultants, evaluation of patient's response to treatment, examination of patient, obtaining history from patient or surrogate, ordering and performing treatments and interventions, ordering and review of laboratory studies, ordering and review of radiographic studies, pulse oximetry and re-evaluation of patient's condition.    Sherwood Gambler, MD 05/26/19 1144

## 2019-05-26 NOTE — ED Notes (Signed)
Pt asleep and tolerating BiPAP well.

## 2019-05-26 NOTE — ED Triage Notes (Signed)
Pt brought in by niece for disorientation. Upon arrival, pt alert and oriented, but states she is sleepy. O2 sats 65% on RA. Have applied nonrebreather at 15 L. O2 sats back up to 99%. Have switched over to 5L . Pt has swelling to abdomen and face.

## 2019-05-26 NOTE — Progress Notes (Signed)
CRITICAL VALUE ALERT  Critical Value:  Troponin 224  Date & Time Notied:  4/14 @ 1620  Provider Notified: Dr. Denton Brick  Orders Received/Actions taken: Another troponin ordered. Cardiology consult ordered.

## 2019-05-26 NOTE — ED Notes (Signed)
Date and time results received: 05/26/19 1045  Test: Troponin Critical Value: 254  Name of Provider Notified: Dr Regenia Skeeter  Orders Received? Or Actions Taken?: see new orders

## 2019-05-26 NOTE — ED Notes (Signed)
Date and time results received: 04/14/211:14 PM    Test: troponin Critical Value: 266  Name of Provider Notified: Roxan Hockey, MD  Orders Received? Or Actions Taken? See orders

## 2019-05-26 NOTE — Progress Notes (Signed)
*  PRELIMINARY RESULTS* Echocardiogram 2D Echocardiogram has been performed.  Marisa Bailey 05/26/2019, 4:00 PM

## 2019-05-26 NOTE — ED Provider Notes (Signed)
[facility] EMERGENCY DEPARTMENT Provider Note   CSN: [id number] Arrival date & time: 05/26/19  4818     History Chief Complaint  Patient presents with  . Hypoxia     Marisa Bailey is a 79 y.o. female with a history of COPD, HTN, h/o previous admission for acute respiratory failure and h/o CHF with last echo 07/2018 revealing for EF of 50-55% presenting with altered mental status.  Upon initial eval, pt with moderate sob, tachypnea, wheezing and labored breathing.  Her pulse ox was 65% on room air, application of nonrebreather improved ox saturation to 99% where she remained after switching to 5L [place].  She denies chest pain.  She reports feeling bad since attending a funeral this weekend.  She is a poor history giver.   Level 5 caveat.     The history is provided by the patient and medical records.       Past Medical History:  Diagnosis Date  . Bronchitis   . COPD (chronic obstructive pulmonary disease) ([place])   . Hypertension     Patient Active Problem List   Diagnosis Date Noted  . Acute Respiratory failure with hypoxia ([place]) 01/11/2019  . Acute respiratory failure ([place]) 01/10/2019  . Acute respiratory failure with hypoxia and hypercapnia ([place]) 08/01/2018  . Acute lower UTI 08/01/2018  . Hypoxia   . Leg swelling   . Obesity (BMI 30-39.9) 01/24/2018  . Acute respiratory failure with hypoxia ([place]) 06/27/2017  . COPD (chronic obstructive pulmonary disease) ([place]) 06/27/2017  . Acute exacerbation of CHF (congestive heart failure) ([place]) 06/27/2017  . Tobacco abuse 06/27/2017  . Hypertension   . Muscle weakness (generalized) 08/17/2012  . Pain in joint, shoulder region 08/17/2012  . Fx upper humerus-closed 08/17/2012    Past Surgical History:  Procedure Laterality Date  . ENUCLEATION       OB History   No obstetric history on file.     No family history on file.  Social History   Tobacco Use  . Smoking status: Current Every Day Smoker    Packs/day: 0.50     Types: Cigarettes  . Smokeless tobacco: Never Used  Substance Use Topics  . Alcohol use: No    Comment: rarely  . Drug use: No    Home Medications Prior to Admission medications   Medication Sig Start Date End Date Taking? Authorizing Provider  acetaminophen (TYLENOL) 325 MG tablet Take 2 tablets (650 mg total) by mouth every 6 (six) hours as needed for mild pain (or Fever >/= 101). 01/14/19   Roxan Hockey, MD  albuterol (PROVENTIL) (2.5 MG/3ML) 0.083% nebulizer solution Take 3 mLs (2.5 mg total) by nebulization every 6 (six) hours as needed for wheezing or shortness of breath. 01/14/19   Roxan Hockey, MD  amLODipine (NORVASC) 10 MG tablet Take 1 tablet (10 mg total) by mouth daily. 01/14/19   Roxan Hockey, MD  aspirin EC 81 MG tablet Take 81 mg by mouth at bedtime.     [provider]  budesonide-formoterol (SYMBICORT) 160-4.5 MCG/ACT inhaler Inhale 2 puffs into the lungs 2 (two) times daily. 01/14/19   Roxan Hockey, MD  folic acid (FOLVITE) 1 MG tablet Take 1 tablet (1 mg total) by mouth daily. 01/14/19   Roxan Hockey, MD  furosemide (LASIX) 40 MG tablet Take 1 tablet (40 mg total) by mouth See admin instructions. Take 1 tablet (40 mg) every Morning and take half tablet (20 mg) every evening of fluid and  heart 01/14/19  Roxan Hockey, MD  olmesartan (BENICAR) 40 MG tablet Take 1 tablet (40 mg total) by mouth daily. 01/14/19   Roxan Hockey, MD  predniSONE (DELTASONE) 20 MG tablet Take 2 tablets (40 mg total) by mouth daily with breakfast. 01/14/19   Roxan Hockey, MD  PROAIR HFA 108 (90 Base) MCG/ACT inhaler Inhale 1-2 puffs into the lungs every 4 (four) hours as needed for wheezing or shortness of breath. 01/14/19   Roxan Hockey, MD    Allergies    Penicillins  Review of Systems   Review of Systems  Unable to perform ROS: Mental status change  Respiratory: Positive for shortness of breath.   Cardiovascular: Negative for chest pain.    Physical  Exam Updated Vital Signs BP (!) 126/109   Pulse 66   Temp 98.9 F (37.2 C) (Oral)   Resp (!) 25   Wt 81.5 kg   SpO2 97%   BMI 31.83 kg/m   Physical Exam Vitals and nursing note reviewed.  Constitutional:      Appearance: She is well-developed.  HENT:     Head: Normocephalic and atraumatic.  Eyes:     Conjunctiva/sclera: Conjunctivae normal.  Cardiovascular:     Rate and Rhythm: Normal rate and regular rhythm.     Heart sounds: Normal heart sounds.  Pulmonary:     Effort: Respiratory distress present.     Breath sounds: Wheezing and rales present.     Comments: Wheezing throughout all lung fields, prolonged expiration.  Bilateral rales appreciated.  Abdominal:     General: Bowel sounds are normal.     Palpations: Abdomen is soft.     Tenderness: There is no abdominal tenderness.  Musculoskeletal:        General: No swelling or tenderness. Normal range of motion.     Cervical back: Normal range of motion.     Right lower leg: No edema.     Left lower leg: No edema.  Skin:    General: Skin is warm and dry.  Neurological:     Mental Status: She is alert.     ED Results / Procedures / Treatments   Labs (all labs ordered are listed, but only abnormal results are displayed) Labs Reviewed  BASIC METABOLIC PANEL - Abnormal; Notable for the following components:      Result Value   Glucose, Bld 110 (*)    Calcium 8.4 (*)    GFR calc non Af Amer 56 (*)    All other components within normal limits  BRAIN NATRIURETIC PEPTIDE - Abnormal; Notable for the following components:   B Natriuretic Peptide 327.0 (*)    All other components within normal limits  BLOOD GAS, ARTERIAL - Abnormal; Notable for the following components:   pH, Arterial 7.287 (*)    pCO2 arterial 63.0 (*)    pO2, Arterial 133 (*)    Acid-Base Excess 3.0 (*)    All other components within normal limits  CBC WITH DIFFERENTIAL/PLATELET - Abnormal; Notable for the following components:   MCHC 28.7 (*)     RDW 16.2 (*)    nRBC 1.1 (*)    All other components within normal limits  TROPONIN I (HIGH SENSITIVITY) - Abnormal; Notable for the following components:   Troponin I (High Sensitivity) 254 (*)    All other components within normal limits  RESPIRATORY PANEL BY RT PCR (FLU A&B, COVID)  BLOOD GAS, ARTERIAL  POC SARS CORONAVIRUS 2 AG -  ED  TROPONIN I (HIGH SENSITIVITY)  EKG EKG Interpretation  Date/Time:  Wednesday May 26 2019 10:59:31 EDT Ventricular Rate:  72 PR Interval:    QRS Duration: 80 QT Interval:  366 QTC Calculation: 401 R Axis:   -17 Text Interpretation: Sinus rhythm Borderline left axis deviation Anterior infarct, old no acute ST/T changes similar to earlier in the day Confirmed by Sherwood Gambler [phone]) on 05/26/2019 11:01:59 AM   Radiology DG Chest Port 1 View  Result Date: 05/26/2019 CLINICAL DATA:  79 year old female with history of shortness of breath this morning. EXAM: PORTABLE CHEST 1 VIEW COMPARISON:  Chest x-ray 01/13/2019. FINDINGS: Lung volumes are normal. Diffuse peribronchial cuffing, most evident throughout the mid to lower lungs bilaterally. No consolidative airspace disease. No pleural effusions. No evidence of pulmonary edema. Mild cardiomegaly. Upper mediastinal contours are within normal limits. Aortic atherosclerosis. IMPRESSION: 1. Diffuse peribronchial cuffing concerning for an acute bronchitis. 2. Mild cardiomegaly. 3. Aortic atherosclerosis. Electronically Signed   By: Vinnie Langton M.D.   On: 05/26/2019 10:14    Procedures Procedures (including critical care time)   CRITICAL CARE Performed by: Evalee Jefferson Total critical care time: 35 minutes Critical care time was exclusive of separately billable procedures and treating other patients. Critical care was necessary to treat or prevent imminent or life-threatening deterioration. Critical care was time spent personally by me on the following activities: development of treatment plan with  patient and/or surrogate as well as nursing, discussions with consultants, evaluation of patient's response to treatment, examination of patient, obtaining history from patient or surrogate, ordering and performing treatments and interventions, ordering and review of laboratory studies, ordering and review of radiographic studies, pulse oximetry and re-evaluation of patient's condition.   Medications Ordered in ED Medications  albuterol (PROVENTIL) (2.5 MG/3ML) 0.083% nebulizer solution 5 mg (5 mg Nebulization Not Given 05/26/19 1115)  albuterol (VENTOLIN HFA) 108 (90 Base) MCG/ACT inhaler 4 puff (4 puffs Inhalation Given 05/26/19 1017)  methylPREDNISolone sodium succinate (SOLU-MEDROL) 125 mg/2 mL injection 125 mg (125 mg Intravenous Given 05/26/19 1017)  aerochamber Z-Stat Plus/medium (1 each  Given 05/26/19 1017)  aspirin chewable tablet 324 mg (324 mg Oral Given 05/26/19 1101)    ED Course  I have reviewed the triage vital signs and the nursing notes.  Pertinent labs & imaging results that were available during my care of the patient were reviewed by me and considered in my medical decision making (see chart for details).    MDM Rules/Calculators/A&P                      Pt with acute respiratory failure with hypoxia and hypercapnia. Significant wheezing on initial exam with improvement but not complete resolution of wheezing.  Oxygen level remained normal range on [place] 5L.  Suspect acute COPD with component of CHF given rales and increased bnp level.  Initial troponin significantly elevated, pt denies cp, ekg stable.    Dr. Regenia Skeeter spoke with cardiology Dr. Domenic Polite, probably from demand of hypoxia, recommended cycling troponins.  Dr. Denton Brick to admit to hospitalist service.     Final Clinical Impression(s) / ED Diagnoses Final diagnoses:  Acute on chronic respiratory failure with hypoxia and hypercapnia [facility])    Rx / DC Orders ED Discharge Orders    None       Landis Martins 05/26/19 1210    Sherwood Gambler, MD 05/26/19 [phone]

## 2019-05-27 ENCOUNTER — Encounter (HOSPITAL_COMMUNITY): Payer: Self-pay | Admitting: Family Medicine

## 2019-05-27 DIAGNOSIS — J9622 Acute and chronic respiratory failure with hypercapnia: Secondary | ICD-10-CM | POA: Diagnosis not present

## 2019-05-27 DIAGNOSIS — R778 Other specified abnormalities of plasma proteins: Secondary | ICD-10-CM

## 2019-05-27 DIAGNOSIS — I5033 Acute on chronic diastolic (congestive) heart failure: Secondary | ICD-10-CM | POA: Diagnosis not present

## 2019-05-27 DIAGNOSIS — J9621 Acute and chronic respiratory failure with hypoxia: Secondary | ICD-10-CM | POA: Diagnosis not present

## 2019-05-27 DIAGNOSIS — Z720 Tobacco use: Secondary | ICD-10-CM

## 2019-05-27 DIAGNOSIS — I10 Essential (primary) hypertension: Secondary | ICD-10-CM

## 2019-05-27 DIAGNOSIS — J449 Chronic obstructive pulmonary disease, unspecified: Secondary | ICD-10-CM

## 2019-05-27 LAB — BASIC METABOLIC PANEL
Anion gap: 10 (ref 5–15)
BUN: 21 mg/dL (ref 8–23)
CO2: 29 mmol/L (ref 22–32)
Calcium: 8.3 mg/dL — ABNORMAL LOW (ref 8.9–10.3)
Chloride: 101 mmol/L (ref 98–111)
Creatinine, Ser: 0.87 mg/dL (ref 0.44–1.00)
GFR calc Af Amer: >60 mL/min (ref >60)
GFR calc non Af Amer: >60 mL/min (ref >60)
Glucose, Bld: 121 mg/dL — ABNORMAL HIGH (ref 70–99)
Potassium: 4.5 mmol/L (ref 3.5–5.1)
Sodium: 140 mmol/L (ref 135–145)

## 2019-05-27 LAB — BLOOD GAS, ARTERIAL
Acid-Base Excess: 5.4 mmol/L — ABNORMAL HIGH (ref 0.0–2.0)
Bicarbonate: 28.3 mmol/L — ABNORMAL HIGH (ref 20.0–28.0)
FIO2: 36
O2 Saturation: 95.3 %
Patient temperature: 36
pCO2 arterial: 55 mmHg — ABNORMAL HIGH (ref 32.0–48.0)
pH, Arterial: 7.364 (ref 7.350–7.450)
pO2, Arterial: 84.1 mmHg (ref 83.0–108.0)

## 2019-05-27 LAB — CBC
HCT: 43.7 % (ref 36.0–46.0)
Hemoglobin: 12.6 g/dL (ref 12.0–15.0)
MCH: 27.9 pg (ref 26.0–34.0)
MCHC: 28.8 g/dL — ABNORMAL LOW (ref 30.0–36.0)
MCV: 96.9 fL (ref 80.0–100.0)
Platelets: 239 10*3/uL (ref 150–400)
RBC: 4.51 MIL/uL (ref 3.87–5.11)
RDW: 16.1 % — ABNORMAL HIGH (ref 11.5–15.5)
WBC: 4.1 10*3/uL (ref 4.0–10.5)
nRBC: 0.7 % — ABNORMAL HIGH (ref 0.0–0.2)

## 2019-05-27 LAB — MRSA PCR SCREENING: MRSA by PCR: NEGATIVE

## 2019-05-27 MED ORDER — ATORVASTATIN CALCIUM 20 MG PO TABS
20.0000 mg | ORAL_TABLET | Freq: Every day | ORAL | Status: DC
  Administered 2019-05-27 – 2019-05-28 (×2): 20 mg via ORAL
  Filled 2019-05-27 (×2): qty 1

## 2019-05-27 NOTE — Progress Notes (Signed)
Patient Demographics:    Marisa Bailey, is a 79 y.o. female, DOB - October 10, 1940, VSY:[id number]  Admit date - 05/26/2019   Admitting Physician Klyde Banka Denton Brick, MD  Outpatient Primary MD for the patient is Lucia Gaskins, MD  LOS - 1   Chief Complaint  Patient presents with  . Hypoxia         Subjective:    Marisa Bailey today has no fevers, no emesis,  No chest pain,   -Used BiPAP overnight, -Dyspnea and hypoxia noted -Becomes even more dyspneic and O2 sats dropped further with minimal activity in bed  Assessment  & Plan :    Principal Problem:   Acute on chronic respiratory failure with hypoxia and hypercapnia (HCC) Active Problems:   Acute Respiratory failure with hypoxia (HCC)   COPD (chronic obstructive pulmonary disease) (HCC)   Hypertension   Tobacco abuse   Brief Summary:- 79 y.o. female with past medical history of chronic diastolic CHF (EF 75-30% by echo imaging in 06/2017 and 07/2018), coronary calcifications by prior CT, COPD, HTN and tobacco use admitted on 05/26/2019 with acute on chronic hypoxic and hypercapnic respiratory failure with altered mentation in the setting of COPD exacerbation and found to have elevated troponin and BNP   A/p 1) acute on chronic hypoxic and hypercapnic respiratory failure due to mostly COPD distribution, compounded by some degree of chronic diastolic dysfunction CHF exacerbation ---- improving after continuous BiPAP use overnight, okay to continue BiPAP nightly -Continue IV Solu-Medrol, continue bronchodilators, and doxycycline and mucolytics for COPD exacerbation -Repeat ABG this a.m. on nasal cannula after using BiPAP overnight shows compensated hypoxic and hypercapnic respiratory failure -PTA patient was supposed to be on home O2 but she had stopped using it for a while   2)HFpEF--- acute on chronic diastolic dysfunction CHF, dyspnea and hypoxia  improving slowly  -Elevated troponin and BNP noted on admission -Troponin-- 254>>266>>224>>208 BNP 327 -PTA patient was taking Lasix 40 mg pneumonia 20 mg the evening -Prior weight around 180 pounds from December 2020, current weight 186 pounds -Continue IV Lasix 40 twice daily with fluid input and output monitoring and daily weights -Repeat echo on 05/26/2019 with - EF of 50%, with global hypokinesis and mild LVH, and grade 1 diastolic dysfunction - Prior echo from 08/02/18 with EF 50 to55% - discussed with cardiology service no further ischemia work-up pending at this time - coronary calcifications by prior CT so would continue ASA 58m daily and start Lipitor   3) acute COPD exacerbation--- management as above #1 Improving after continuous BiPAP use overnight, okay to continue BiPAP nightly -Manage as above #1  4) ongoing tobacco abuse--- currently smoking at least half a pack a day, nicotine patch is ordered smoking cessation advised  5)HTN-continue amlodipine, plan to restart PTA olmesartan if BP stable  6) acute metabolic encephalopathy--- secondary to hypercapnia as above #1, -Resolved after prolonged BiPAP use  Disposition/Need for in-Hospital Stay- patient unable to be discharged at this time due to --ongoing hypercapnic and hypoxic respiratory failure requiring IV diuresis and IV steroids---patient remains asymptomatic from a cardiopulmonary standpoint with dyspnea and hypoxia -Patient From: home D/C Place: home  Barriers: Not Clinically Stable- --anticipate patient will need home O2 upon discharge -  Code Status : full  Family Communication:  (patient is alert, awake and coherent)  Consults  : Cardiology  DVT Prophylaxis  :    - Heparin - SCDs  Lab Results  Component Value Date   PLT 239 05/27/2019    Inpatient Medications  Scheduled Meds: . amLODipine  10 mg Oral Daily  . aspirin EC  81 mg Oral Daily  . atorvastatin  20 mg Oral Daily  . chlorhexidine  15  mL Mouth Rinse BID  . Chlorhexidine Gluconate Cloth  6 each Topical Daily  . doxycycline  100 mg Oral Q12H  . folic acid  1 mg Oral Daily  . furosemide  40 mg Intravenous BID  . guaiFENesin  600 mg Oral BID  . heparin  5,000 Units Subcutaneous Q8H  . mouth rinse  15 mL Mouth Rinse q12n4p  . methylPREDNISolone (SOLU-MEDROL) injection  40 mg Intravenous Q8H  . mometasone-formoterol  2 puff Inhalation BID  . sodium chloride flush  3 mL Intravenous Q12H   Continuous Infusions: . sodium chloride     PRN Meds:.sodium chloride, albuterol, ondansetron **OR** ondansetron (ZOFRAN) IV, polyethylene glycol, sodium chloride flush, traZODone    Anti-infectives (From admission, onward)   Start     Dose/Rate Route Frequency Ordered Stop   05/26/19 1800  doxycycline (VIBRA-TABS) tablet 100 mg     100 mg Oral Every 12 hours 05/26/19 1749          Objective:   Vitals:   05/27/19 0845 05/27/19 0900 05/27/19 1000 05/27/19 1047  BP: (!) 149/73 (!) 151/72  103/87  Pulse: 62 64 68 70  Resp: (!) 27 16 (!) 21 (!) 22  Temp:      TempSrc:      SpO2: 99% 100% 96% 96%  Weight:      Height:        Wt Readings from Last 3 Encounters:  05/27/19 84.4 kg  01/10/19 81.5 kg  08/05/18 74.6 kg     Intake/Output Summary (Last 24 hours) at 05/27/2019 1101 Last data filed at 05/26/2019 2300 Gross per 24 hour  Intake 0 ml  Output 900 ml  Net -900 ml    Physical Exam  Gen:- Awake Alert, conversational dyspnea HEENT:- [place].AT, No sclera icterus RT Eye-Vision Loss Nose/Mouth--alternating between BiPAP on nasal cannula Neck-Supple Neck,No JVD,.  Lungs-diminished in bases, few scattered wheezes  CV- S1, S2 normal, regular  Abd-  +ve B.Sounds, Abd Soft, No tenderness,    Extremity/Skin:-Trace  edema, pedal pulses present  Psych-affect is appropriate, oriented x3 Neuro-generalized weakness no new focal deficits, no tremors   Data Review:   Micro Results Recent Results (from the past 240 hour(s))   Respiratory Panel by RT PCR (Flu A&B, Covid) - Nasopharyngeal Swab     Status: None   Collection Time: 05/26/19 10:48 AM   Specimen: Nasopharyngeal Swab  Result Value Ref Range Status   SARS Coronavirus 2 by RT PCR NEGATIVE NEGATIVE Final    Comment: (NOTE) SARS-CoV-2 target nucleic acids are NOT DETECTED. The SARS-CoV-2 RNA is generally detectable in upper respiratoy specimens during the acute phase of infection. The lowest concentration of SARS-CoV-2 viral copies this assay can detect is 131 copies/mL. A negative result does not preclude SARS-Cov-2 infection and should not be used as the sole basis for treatment or other patient management decisions. A negative result may occur with  improper specimen collection/handling, submission of specimen other than nasopharyngeal swab, presence of viral mutation(s) within the areas targeted by this assay, and inadequate number  of viral copies (<131 copies/mL). A negative result must be combined with clinical observations, patient history, and epidemiological information. The expected result is Negative. Fact Sheet for Patients:  PinkCheek.be Fact Sheet for Healthcare Providers:  GravelBags.it This test is not yet ap proved or cleared by the [place] FDA and  has been authorized for detection and/or diagnosis of SARS-CoV-2 by FDA under an Emergency Use Authorization (EUA). This EUA will remain  in effect (meaning this test can be used) for the duration of the COVID-19 declaration under Section 564(b)(1) of the Act, 21 U.S.C. section 360bbb-3(b)(1), unless the authorization is terminated or revoked sooner.    Influenza A by PCR NEGATIVE NEGATIVE Final   Influenza B by PCR NEGATIVE NEGATIVE Final    Comment: (NOTE) The Xpert Xpress SARS-CoV-2/FLU/RSV assay is intended as an aid in  the diagnosis of influenza from Nasopharyngeal swab specimens and  should not be used as a sole  basis for treatment. Nasal washings and  aspirates are unacceptable for Xpert Xpress SARS-CoV-2/FLU/RSV  testing. Fact Sheet for Patients: PinkCheek.be Fact Sheet for Healthcare Providers: GravelBags.it This test is not yet approved or cleared by the [place] FDA and  has been authorized for detection and/or diagnosis of SARS-CoV-2 by  FDA under an Emergency Use Authorization (EUA). This EUA will remain  in effect (meaning this test can be used) for the duration of the  Covid-19 declaration under Section 564(b)(1) of the Act, 21  U.S.C. section 360bbb-3(b)(1), unless the authorization is  terminated or revoked. Performed at [facility], [address], [place], [place] [address]   MRSA PCR Screening     Status: None   Collection Time: 05/26/19  2:31 PM   Specimen: Nasal Mucosa; Nasopharyngeal  Result Value Ref Range Status   MRSA by PCR NEGATIVE NEGATIVE Final    Comment:        The GeneXpert MRSA Assay (FDA approved for NASAL specimens only), is one component of a comprehensive MRSA colonization surveillance program. It is not intended to diagnose MRSA infection nor to guide or monitor treatment for MRSA infections. Performed at [facility], [address], [place], [place] [address]     Radiology Reports DG Chest [place] 1 View  Result Date: 05/26/2019 CLINICAL DATA:  79 year old female with history of shortness of breath this morning. EXAM: PORTABLE CHEST 1 VIEW COMPARISON:  Chest x-ray 01/13/2019. FINDINGS: Lung volumes are normal. Diffuse peribronchial cuffing, most evident throughout the mid to lower lungs bilaterally. No consolidative airspace disease. No pleural effusions. No evidence of pulmonary edema. Mild cardiomegaly. Upper mediastinal contours are within normal limits. Aortic atherosclerosis. IMPRESSION: 1. Diffuse peribronchial cuffing concerning for an acute bronchitis. 2. Mild cardiomegaly. 3. Aortic  atherosclerosis. Electronically Signed   By: Vinnie Langton M.D.   On: 05/26/2019 10:14   ECHOCARDIOGRAM COMPLETE  Result Date: 05/26/2019    ECHOCARDIOGRAM REPORT   Patient Name:   Cheryl E Bolte Date of Exam: 05/26/2019 Medical Rec #:  [id number]      Height:       67.0 in Accession #:    [id number]     Weight:       184.5 lb Date of Birth:  02-13-1940       BSA:          1.954 m Patient Age:    78 years       BP:           139/64 mmHg Patient Gender: F  HR:           61 bpm. Exam Location:  [facility] Procedure: 2D Echo Indications:    Abnormal ECG 794.31 / R94.31  History:        Patient has prior history of Echocardiogram examinations, most                 recent 08/02/2018. CHF, COPD; Risk Factors:Hypertension and                 Current Smoker. Acute Respiratory failure with hypoxia.  Sonographer:    Leavy Cella RDCS (AE) Referring Phys: [id number] Zacari Radick IMPRESSIONS  1. Left ventricular ejection fraction, by estimation, is 50%. The left ventricle has low normal function. The left ventricle demonstrates global hypokinesis. There is mild left ventricular hypertrophy. Left ventricular diastolic parameters are consistent with Grade I diastolic dysfunction (impaired relaxation). There is the interventricular septum is flattened in systole and diastole, consistent with right ventricular pressure and volume overload.  2. Right ventricular systolic function is moderately reduced. The right ventricular size is moderately enlarged. There is mildly elevated pulmonary artery systolic pressure. The estimated right ventricular systolic pressure is 24.8 mmHg.  3. Left atrial size was upper normal.  4. Right atrial size was upper normal.  5. The mitral valve is grossly normal. Trivial mitral valve regurgitation.  6. The aortic valve is tricuspid. Aortic valve regurgitation is not visualized.  7. The inferior vena cava is dilated in size with >50% respiratory variability, suggesting right atrial  pressure of 8 mmHg. FINDINGS  Left Ventricle: Left ventricular ejection fraction, by estimation, is 50%. The left ventricle has low normal function. The left ventricle demonstrates global hypokinesis. The left ventricular internal cavity size was normal in size. There is mild left ventricular hypertrophy. The interventricular septum is flattened in systole and diastole, consistent with right ventricular pressure and volume overload. Left ventricular diastolic parameters are consistent with Grade I diastolic dysfunction (impaired relaxation). Right Ventricle: The right ventricular size is moderately enlarged. No increase in right ventricular wall thickness. Right ventricular systolic function is moderately reduced. There is mildly elevated pulmonary artery systolic pressure. The tricuspid regurgitant velocity is 2.65 m/s, and with an assumed right atrial pressure of 8 mmHg, the estimated right ventricular systolic pressure is 25.0 mmHg. Left Atrium: Left atrial size was upper normal. Right Atrium: Right atrial size was upper normal. Pericardium: There is no evidence of pericardial effusion. Mitral Valve: The mitral valve is grossly normal. Mild mitral annular calcification. Trivial mitral valve regurgitation. Tricuspid Valve: The tricuspid valve is grossly normal. Tricuspid valve regurgitation is mild. Aortic Valve: The aortic valve is tricuspid. Aortic valve regurgitation is not visualized. Mild aortic valve annular calcification. Pulmonic Valve: The pulmonic valve was grossly normal. Pulmonic valve regurgitation is trivial. Aorta: The aortic root is normal in size and structure. Venous: The inferior vena cava is dilated in size with greater than 50% respiratory variability, suggesting right atrial pressure of 8 mmHg. IAS/Shunts: No atrial level shunt detected by color flow Doppler.  LEFT VENTRICLE PLAX 2D LVIDd:         4.17 cm  Diastology LVIDs:         3.17 cm  LV e' lateral:   4.70 cm/s LV PW:         1.23 cm   LV E/e' lateral: 11.8 LV IVS:        0.85 cm  LV e' medial:    3.73 cm/s LVOT diam:  1.80 cm  LV E/e' medial:  14.8 LVOT Area:     2.54 cm  RIGHT VENTRICLE RV S prime:     12.00 cm/s TAPSE (M-mode): 2.0 cm LEFT ATRIUM             Index LA diam:        3.40 cm 1.74 cm/m LA Vol (A2C):   42.3 ml 21.65 ml/m LA Vol (A4C):   66.2 ml 33.88 ml/m LA Biplane Vol: 56.8 ml 29.07 ml/m   AORTA Ao Root diam: 3.30 cm MITRAL VALVE               TRICUSPID VALVE MV Area (PHT): 3.17 cm    TR Peak grad:   28.1 mmHg MV Decel Time: 239 msec    TR Vmax:        265.00 cm/s MV E velocity: 55.30 cm/s MV A velocity: 84.00 cm/s  SHUNTS MV E/A ratio:  0.66        Systemic Diam: 1.80 cm Rozann Lesches MD Electronically signed by Rozann Lesches MD Signature Date/Time: 05/26/2019/4:10:43 PM    Final      CBC Recent Labs  Lab 05/26/19 0956 05/27/19 0417  WBC 5.7 4.1  HGB 12.3 12.6  HCT 42.8 43.7  PLT 259 239  MCV 97.3 96.9  MCH 28.0 27.9  MCHC 28.7* 28.8*  RDW 16.2* 16.1*  LYMPHSABS 1.2  --   MONOABS 0.4  --   EOSABS 0.3  --   BASOSABS 0.0  --     Chemistries  Recent Labs  Lab 05/26/19 1000 05/27/19 0417  NA 139 140  K 4.0 4.5  CL 101 101  CO2 27 29  GLUCOSE 110* 121*  BUN 17 21  CREATININE 0.97 0.87  CALCIUM 8.4* 8.3*   ------------------------------------------------------------------------------------------------------------------ No results for input(s): CHOL, HDL, LDLCALC, TRIG, CHOLHDL, LDLDIRECT in the last 72 hours.  No results found for: HGBA1C ------------------------------------------------------------------------------------------------------------------ No results for input(s): TSH, T4TOTAL, T3FREE, THYROIDAB in the last 72 hours.  Invalid input(s): FREET3 ------------------------------------------------------------------------------------------------------------------ No results for input(s): VITAMINB12, FOLATE, FERRITIN, TIBC, IRON, RETICCTPCT in the last 72 hours.  Coagulation  profile No results for input(s): INR, PROTIME in the last 168 hours.  No results for input(s): DDIMER in the last 72 hours.  Cardiac Enzymes No results for input(s): CKMB, TROPONINI, MYOGLOBIN in the last 168 hours.  Invalid input(s): CK ------------------------------------------------------------------------------------------------------------------    Component Value Date/Time   BNP 327.0 (H) 05/26/2019 1000    Roxan Hockey M.D on 05/27/2019 at 11:01 AM  Go to [email or web address] - for contact info  [facility] - Office  [phone]       \

## 2019-05-27 NOTE — Progress Notes (Signed)
SATURATION QUALIFICATIONS: (This note is used to comply with regulatory documentation for home oxygen)  Patient Saturations on Room Air at Rest = 92%  Patient Saturations on Room Air while Ambulating = 76%  Patient Saturations on 3 Liters of oxygen while Ambulating = 93%  Please briefly explain why patient needs home oxygen: Patient desaturates to below 90% with minimal activity without supplemental oxygenation; decreasing as far a 72% with short distance ambulation and activity within her hospital room. Patient is able to ambulate with saturations above 90% on 3 LPM of supplemental oxgygen.   Floria Raveling. Hartnett-Rands, MS, PT Per Page [phone] 05/27/2019

## 2019-05-27 NOTE — Evaluation (Signed)
Physical Therapy Evaluation Patient Details Name: Marisa Bailey MRN: [id number] DOB: Oct 23, 1940 Today's Date: 05/27/2019   History of Present Illness  79 y.o. female with past medical history of chronic diastolic CHF (EF 96-28% by echo imaging in 06/2017 and 07/2018), coronary calcifications by prior CT, COPD, HTN and tobacco use admitted on 05/26/2019 with acute on chronic hypoxic and hypercapnic respiratory failure with altered mentation in the setting of COPD exacerbation and found to have elevated troponin and BNP.    Clinical Impression  Pt admitted with above diagnosis. Patient exhibited impulsivity, decreased safety awareness, impaired balance and gait abilities with reports of "a few" falls at home without injury in the past 6 months. It is unclear how much help she receives from family members prior to admission. Patient did state she "didn't want to live with nobody else" during the evaluation. Patient currently refusing assistive device use but may benefit from a [facility] or RW for stability and safety with ambulation and transfers in her home. Pt currently with functional limitations due to the deficits listed below (see PT Problem List). Pt will benefit from skilled PT to increase their independence and safety with mobility to allow discharge to the venue listed below.       Follow Up Recommendations Home health PT;Supervision for mobility/OOB    Equipment Recommendations  Other (comment)(patient may benefit from a [facility] or RW but is currently refusing use.)    Recommendations for Other Services       Precautions / Restrictions Precautions Precautions: Fall Precaution Comments: patient reports " a few" falls in the last 6 months Restrictions Weight Bearing Restrictions: No      Mobility  Bed Mobility Overal bed mobility: Needs Assistance Bed Mobility: Rolling;Supine to Sit;Sit to Supine Rolling: Modified independent (Device/Increase time)   Supine to sit: Modified independent  (Device/Increase time);Supervision Sit to supine: Modified independent (Device/Increase time);Supervision   General bed mobility comments: increased time; supervision for safety; dimished safety awareness  Transfers Overall transfer level: Needs assistance Equipment used: None Transfers: Sit to/from Stand;Stand Pivot Transfers Sit to Stand: Min guard Stand pivot transfers: Min guard       General transfer comment: somewhat unsteady and exhibited impulsivity  Ambulation/Gait Ambulation/Gait assistance: Min guard Gait Distance (Feet): 20 Feet Assistive device: None Gait Pattern/deviations: Step-through pattern;Decreased step length - right;Decreased step length - left;Decreased stride length;Trunk flexed Gait velocity: decreased   General Gait Details: impulsivity noted, decreased safety awareness, unsteady, small, unequal length steps  Stairs            Wheelchair Mobility    Modified Rankin (Stroke Patients Only)       Balance Overall balance assessment: Needs assistance Sitting-balance support: Bilateral upper extremity supported;Feet supported Sitting balance-Leahy Scale: Good     Standing balance support: No upper extremity supported;During functional activity Standing balance-Leahy Scale: Poor Standing balance comment: fair balance with single hand support                             Pertinent Vitals/Pain Pain Assessment: No/denies pain    Home Living Family/patient expects to be discharged to:: Private residence Living Arrangements: Alone Available Help at Discharge: Family;Available PRN/intermittently(patient reports she has 2 nieces that help her about 1x/wk) Type of Home: House Home Access: Level entry     Home Layout: One level Home Equipment: None Additional Comments: patient reports no equipment or use at home or in the community patient reports " a few"  falls in the last 6 moths    Prior Function Level of Independence:  Independent         Comments: patient reports she did all her own cooking, cleaning, laundry, grocery shopping and driving.     Hand Dominance        Extremity/Trunk Assessment   Upper Extremity Assessment Upper Extremity Assessment: Overall WFL for tasks assessed    Lower Extremity Assessment Lower Extremity Assessment: Overall WFL for tasks assessed    Cervical / Trunk Assessment Cervical / Trunk Assessment: Kyphotic  Communication   Communication: Expressive difficulties(appeared to have difficulty completing a whole thought through the end of her sentence.)  Cognition Arousal/Alertness: Awake/alert Behavior During Therapy: WFL for tasks assessed/performed Overall Cognitive Status: Within Functional Limits for tasks assessed                                 General Comments: patient did require additional prompting to follow directions.      General Comments      Exercises     Assessment/Plan    PT Assessment Patient needs continued PT services  PT Problem List Decreased mobility;Decreased safety awareness;Decreased activity tolerance;Decreased balance;Decreased knowledge of use of DME       PT Treatment Interventions DME instruction;Therapeutic activities;Cognitive remediation;Gait training;Therapeutic exercise;Patient/family education;Balance training    PT Goals (Current goals can be found in the Care Plan section)  Acute Rehab PT Goals Patient Stated Goal: Go home. PT Goal Formulation: With patient Time For Goal Achievement: 06/10/19 Potential to Achieve Goals: Fair    Frequency Min 3X/week   Barriers to discharge        Co-evaluation               AM-PAC PT "6 Clicks" Mobility  Outcome Measure Help needed turning from your back to your side while in a flat bed without using bedrails?: A Little Help needed moving from lying on your back to sitting on the side of a flat bed without using bedrails?: A Little Help needed  moving to and from a bed to a chair (including a wheelchair)?: A Little Help needed standing up from a chair using your arms (e.g., wheelchair or bedside chair)?: A Little Help needed to walk in hospital room?: A Little Help needed climbing 3-5 steps with a railing? : A Little 6 Click Score: 18    End of Session Equipment Utilized During Treatment: Oxygen Activity Tolerance: Patient tolerated treatment well Patient left: in chair;with call bell/phone within reach(nursing made aware patient not seated on chair alarm) Nurse Communication: Mobility status PT Visit Diagnosis: Unsteadiness on feet (R26.81);History of falling (Z91.81);Other abnormalities of gait and mobility (R26.89)    Time: 2025-4270 PT Time Calculation (min) (ACUTE ONLY): 30 min   Charges:   PT Evaluation $PT Eval Moderate Complexity: 1 Mod PT Treatments $Therapeutic Activity: 8-22 mins        Floria Raveling. Hartnett-Rands, MS, PT Per [place] #[phone] 05/27/2019, 1:59 PM

## 2019-05-27 NOTE — Progress Notes (Signed)
RT removed patient from BIPAP and placed patient on 4L O2, patient tolerating well and maintaining SATs 96%, HR 67 and RR 16. RT will continue to monitor and assess.

## 2019-05-27 NOTE — Plan of Care (Signed)
  Problem: Acute Rehab PT Goals(only PT should resolve) Goal: Pt Will Go Supine/Side To Sit Outcome: Progressing Flowsheets (Taken 05/27/2019 1406) Pt will go Supine/Side to Sit: with supervision Goal: Pt Will Go Sit To Supine/Side Outcome: Progressing Flowsheets (Taken 05/27/2019 1406) Pt will go Sit to Supine/Side: with supervision Goal: Patient Will Transfer Sit To/From Stand Outcome: Progressing Flowsheets (Taken 05/27/2019 1406) Patient will transfer sit to/from stand: with supervision Goal: Pt Will Transfer Bed To Chair/Chair To Bed Outcome: Progressing Flowsheets (Taken 05/27/2019 1406) Pt will Transfer Bed to Chair/Chair to Bed: with supervision Goal: Pt Will Perform Standing Balance Or Pre-Gait Outcome: Progressing Flowsheets (Taken 05/27/2019 1406) Pt will perform standing balance or pre-gait:  1-2 min  with min guard assist  with unilateral UE support Goal: Pt Will Ambulate Outcome: Progressing Flowsheets (Taken 05/27/2019 1406) Pt will Ambulate:  50 feet  with least restrictive assistive device  with min guard assist  Pamala Hurry D. Hartnett-Rands, MS, PT Per [facility] [phone] 05/27/2019

## 2019-05-27 NOTE — Consult Note (Addendum)
Cardiology Consult    Patient ID: GENEAL HUEBERT; [id number]; March 27, 1940   Admit date: 05/26/2019 Date of Consult: 05/27/2019  Primary Care Provider: Lucia Gaskins, MD Primary Cardiologist: New to [facility] - Dr. Domenic Polite  Patient Profile    Marisa Bailey is a 79 y.o. female with past medical history of chronic diastolic CHF (EF 80-03% by echo imaging in 06/2017 and 07/2018), coronary calcifications by prior CT, COPD, HTN and tobacco use who is being seen today for the evaluation of elevated troponin values at the request of Dr. Denton Brick.   History of Present Illness    Marisa Bailey was brought to [facility] ED on 05/26/2019 by family for evaluation of AMS. Oxygen saturations were at 65% on RA and improved to 99% with NRB.   Initial labs show WBC 5.7, Hgb 12.3, platelets 259, Na+ 139, K+ 4.0 and creatinine 0.97. ABG pH 7.287, pCO2 63 and O2 133. BNP 327. Initial HS Troponin 254 with repeat values of 266 and 224. Negative for COVID-19 and Influenza. EKG showed NSR, HR 71 with LAD and nonspecific ST abnormality along the inferior leads which is similar to prior tracings. CXR showed diffuse peribronchial cuffing consistent with acute bronchitis along with mild cardiomegaly and aortic atherosclerosis. Repeat echo showed a low-normal EF of 50% with global HK and mild LVH with Grade 1 DD. RV function was moderately reduced and she did have trivial MR.   She was admitted for further management of her acute hypoxic respiratory failure which was thought to be multifactorial in the setting of COPD and CHF exacerbation. She has been started on IV steroids along with Doxycycline and scheduled nebulizer treatments. Was also started on IV Lasix 78m BID for diuresis with a recorded net output of -900 mL thus far. Weight recorded as 186 lbs this AM (previously 179 lbs at the time of hospital discharge in 01/2019). Repeat labs this AM showed stable renal function with creatinine at 0.87.  In talking with the  patient this morning, she is unsure of how long her dyspnea had been occurring prior to admission. She is still on BiPAP at this time and is asking when the mask can be removed as she wants to eat and drink. She denies any recent chest pain or palpitations. No orthopnea or PND overnight. Unaware of any lower extremity edema prior to admission. Says she slept well. A&Ox3 this morning.    Past Medical History:  Diagnosis Date  . Bronchitis   . COPD (chronic obstructive pulmonary disease) ([facility]   . Hypertension     Past Surgical History:  Procedure Laterality Date  . ENUCLEATION       Home Medications:  Prior to Admission medications   Medication Sig Start Date End Date Taking? Authorizing Provider  acetaminophen (TYLENOL) 325 MG tablet Take 2 tablets (650 mg total) by mouth every 6 (six) hours as needed for mild pain (or Fever >/= 101). 01/14/19   ERoxan Hockey MD  albuterol (PROVENTIL) (2.5 MG/3ML) 0.083% nebulizer solution Take 3 mLs (2.5 mg total) by nebulization every 6 (six) hours as needed for wheezing or shortness of breath. 01/14/19   ERoxan Hockey MD  amLODipine (NORVASC) 10 MG tablet Take 1 tablet (10 mg total) by mouth daily. 01/14/19   ERoxan Hockey MD  aspirin EC 81 MG tablet Take 81 mg by mouth at bedtime.     [provider]  budesonide-formoterol (SYMBICORT) 160-4.5 MCG/ACT inhaler Inhale 2 puffs into the lungs 2 (two) times daily.  01/14/19   Roxan Hockey, MD  folic acid (FOLVITE) 1 MG tablet Take 1 tablet (1 mg total) by mouth daily. 01/14/19   Roxan Hockey, MD  furosemide (LASIX) 40 MG tablet Take 1 tablet (40 mg total) by mouth See admin instructions. Take 1 tablet (40 mg) every Morning and take half tablet (20 mg) every evening of fluid and  heart 01/14/19   Emokpae, Courage, MD  olmesartan (BENICAR) 40 MG tablet Take 1 tablet (40 mg total) by mouth daily. 01/14/19   Roxan Hockey, MD  predniSONE (DELTASONE) 20 MG tablet Take 2 tablets (40 mg  total) by mouth daily with breakfast. 01/14/19   Roxan Hockey, MD  PROAIR HFA 108 (90 Base) MCG/ACT inhaler Inhale 1-2 puffs into the lungs every 4 (four) hours as needed for wheezing or shortness of breath. 01/14/19   Roxan Hockey, MD    Inpatient Medications: Scheduled Meds: . amLODipine  10 mg Oral Daily  . aspirin EC  81 mg Oral Daily  . chlorhexidine  15 mL Mouth Rinse BID  . Chlorhexidine Gluconate Cloth  6 each Topical Daily  . doxycycline  100 mg Oral Q12H  . folic acid  1 mg Oral Daily  . furosemide  40 mg Intravenous BID  . guaiFENesin  600 mg Oral BID  . heparin  5,000 Units Subcutaneous Q8H  . mouth rinse  15 mL Mouth Rinse q12n4p  . methylPREDNISolone (SOLU-MEDROL) injection  40 mg Intravenous Q8H  . mometasone-formoterol  2 puff Inhalation BID  . sodium chloride flush  3 mL Intravenous Q12H   Continuous Infusions: . sodium chloride     PRN Meds: sodium chloride, albuterol, ondansetron **OR** ondansetron (ZOFRAN) IV, polyethylene glycol, sodium chloride flush, traZODone  Allergies:    Allergies  Allergen Reactions  . Penicillins Rash    Has patient had a PCN reaction causing immediate rash, facial/tongue/throat swelling, SOB or lightheadedness with hypotension: {Yes/No:[id number] Has patient had a PCN reaction causing severe rash involving mucus membranes or skin necrosis: Yes Has patient had a PCN reaction that required hospitalization No Has patient had a PCN reaction occurring within the last 10 years: No If all of the above answers are "NO", then may proceed with Cephalosporin use.  **Has tolerated keflex     Social History:   Social History   Socioeconomic History  . Marital status: Single    Spouse name: Not on file  . Number of children: Not on file  . Years of education: Not on file  . Highest education level: Not on file  Occupational History  . Not on file  Tobacco Use  . Smoking status: Current Every Day Smoker    Packs/day: 0.50     Types: Cigarettes  . Smokeless tobacco: Never Used  Substance and Sexual Activity  . Alcohol use: No    Comment: rarely  . Drug use: No  . Sexual activity: Not on file  Other Topics Concern  . Not on file  Social History Narrative  . Not on file   Social Determinants of Health   Financial Resource Strain:   . Difficulty of Paying Living Expenses:   Food Insecurity:   . Worried About Charity fundraiser in the Last Year:   . Arboriculturist in the Last Year:   Transportation Needs:   . Film/video editor (Medical):   Marland Kitchen Lack of Transportation (Non-Medical):   Physical Activity:   . Days of Exercise per Week:   . Minutes of  Exercise per Session:   Stress:   . Feeling of Stress :   Social Connections:   . Frequency of Communication with Friends and Family:   . Frequency of Social Gatherings with Friends and Family:   . Attends Religious Services:   . Active Member of Clubs or Organizations:   . Attends Archivist Meetings:   Marland Kitchen Marital Status:   Intimate Partner Violence:   . Fear of Current or Ex-Partner:   . Emotionally Abused:   Marland Kitchen Physically Abused:   . Sexually Abused:      Family History:    Family History  Problem Relation Age of Onset  . Diabetes Mother       Review of Systems    General:  No chills, fever, night sweats or weight changes.  Cardiovascular:  No chest pain, edema, orthopnea, palpitations, paroxysmal nocturnal dyspnea. Positive for dyspnea on exertion.  Dermatological: No rash, lesions/masses Respiratory: Positive for cough and dyspnea. Urologic: No hematuria, dysuria Abdominal:   No nausea, vomiting, diarrhea, bright red blood per rectum, melena, or hematemesis Neurologic:  No visual changes, wkns, changes in mental status.  All other systems reviewed and are otherwise negative except as noted above.  Physical Exam/Data    Vitals:   05/27/19 0645 05/27/19 0700 05/27/19 0715 05/27/19 0737  BP: (!) 144/67 (!) 142/65 (!)  146/68   Pulse: 65 (!) 51 (!) 53 (!) 52  Resp: (!) 22 (!) _0 Temp:    (!) 96.3 F (35.7 C)  TempSrc:    Axillary  SpO2: 93% 97% 97% 98%  Weight:      Height:        Intake/Output Summary (Last 24 hours) at 05/27/2019 0841 Last data filed at 05/26/2019 2300 Gross per 24 hour  Intake 0 ml  Output 900 ml  Net -900 ml   Filed Weights   05/26/19 0944 05/26/19 1427 05/27/19 0500  Weight: 81.5 kg 83.7 kg 84.4 kg   Body mass index is 29.14 kg/m.   General: Pleasant female appearing in NAD. Currently on BiPAP Psych: Normal affect. Neuro: Alert and oriented X 3. Moves all extremities spontaneously. HEENT: Normal  Neck: Supple without bruits or JVD. Lungs:  Resp regular and unlabored, decreased along bases with rhonchi throughout. Heart: RRR no s3, s4, or murmurs. Abdomen: Soft, non-tender, non-distended, BS + x 4.  Extremities: No clubbing or cyanosis. Trace lower extremity edema. SCD's in place.  DP/PT/Radials 2+ and equal bilaterally.   EKG:  The EKG was personally reviewed and demonstrates: NSR, HR 71 with LAD and nonspecific ST abnormality along the inferior leads which is similar to prior tracings.   Telemetry:  Telemetry was personally reviewed and demonstrates: Sinus bradycardia, HR in 50's to 60's. Occasional PVC's and escape beats with pauses up to 2.0 seconds.    Labs/Studies     Relevant CV Studies:  Echocardiogram: 05/26/2019 IMPRESSIONS    1. Left ventricular ejection fraction, by estimation, is 50%. The left  ventricle has low normal function. The left ventricle demonstrates global  hypokinesis. There is mild left ventricular hypertrophy. Left ventricular  diastolic parameters are  consistent with Grade I diastolic dysfunction (impaired relaxation). There  is the interventricular septum is flattened in systole and diastole,  consistent with right ventricular pressure and volume overload.  2. Right ventricular systolic function is moderately reduced.  The right  ventricular size is moderately enlarged. There is mildly elevated  pulmonary artery systolic pressure. The estimated right ventricular  systolic pressure is 01.0 mmHg.  3. Left atrial size was upper normal.  4. Right atrial size was upper normal.  5. The mitral valve is grossly normal. Trivial mitral valve  regurgitation.  6. The aortic valve is tricuspid. Aortic valve regurgitation is not  visualized.  7. The inferior vena cava is dilated in size with >50% respiratory  variability, suggesting right atrial pressure of 8 mmHg.   Laboratory Data:  Chemistry Recent Labs  Lab 05/26/19 1000 05/27/19 0417  NA 139 140  K 4.0 4.5  CL 101 101  CO2 27 29  GLUCOSE 110* 121*  BUN 17 21  CREATININE 0.97 0.87  CALCIUM 8.4* 8.3*  GFRNONAA 56* >60  GFRAA >60 >60  ANIONGAP 11 10    No results for input(s): PROT, ALBUMIN, AST, ALT, ALKPHOS, BILITOT in the last 168 hours. Hematology Recent Labs  Lab 05/26/19 0956 05/27/19 0417  WBC 5.7 4.1  RBC 4.40 4.51  HGB 12.3 12.6  HCT 42.8 43.7  MCV 97.3 96.9  MCH 28.0 27.9  MCHC 28.7* 28.8*  RDW 16.2* 16.1*  PLT 259 239   Cardiac EnzymesNo results for input(s): TROPONINI in the last 168 hours. No results for input(s): TROPIPOC in the last 168 hours.  BNP Recent Labs  Lab 05/26/19 1000  BNP 327.0*    DDimer No results for input(s): DDIMER in the last 168 hours.  Radiology/Studies:  DG Chest Port 1 View  Result Date: 05/26/2019 CLINICAL DATA:  79 year old female with history of shortness of breath this morning. EXAM: PORTABLE CHEST 1 VIEW COMPARISON:  Chest x-ray 01/13/2019. FINDINGS: Lung volumes are normal. Diffuse peribronchial cuffing, most evident throughout the mid to lower lungs bilaterally. No consolidative airspace disease. No pleural effusions. No evidence of pulmonary edema. Mild cardiomegaly. Upper mediastinal contours are within normal limits. Aortic atherosclerosis. IMPRESSION: 1. Diffuse peribronchial  cuffing concerning for an acute bronchitis. 2. Mild cardiomegaly. 3. Aortic atherosclerosis. Electronically Signed   By: Vinnie Langton M.D.   On: 05/26/2019 10:14   ECHOCARDIOGRAM COMPLETE  Result Date: 05/26/2019    ECHOCARDIOGRAM REPORT   Patient Name:   Kaydee E Grau Date of Exam: 05/26/2019 Medical Rec #:  [id number]      Height:       67.0 in Accession #:    [id number]     Weight:       184.5 lb Date of Birth:  [birth year]       BSA:          1.954 m Patient Age:    54 years       BP:           139/64 mmHg Patient Gender: F              HR:           61 bpm. Exam Location:  [facility] Procedure: 2D Echo Indications:    Abnormal ECG 794.31 / R94.31  History:        Patient has prior history of Echocardiogram examinations, most                 recent 08/02/2018. CHF, COPD; Risk Factors:Hypertension and                 Current Smoker. Acute Respiratory failure with hypoxia.  Sonographer:    Leavy Cella RDCS (AE) Referring Phys: [id number] COURAGE EMOKPAE IMPRESSIONS  1. Left ventricular ejection fraction, by estimation, is 50%. The left ventricle has low normal function. The  left ventricle demonstrates global hypokinesis. There is mild left ventricular hypertrophy. Left ventricular diastolic parameters are consistent with Grade I diastolic dysfunction (impaired relaxation). There is the interventricular septum is flattened in systole and diastole, consistent with right ventricular pressure and volume overload.  2. Right ventricular systolic function is moderately reduced. The right ventricular size is moderately enlarged. There is mildly elevated pulmonary artery systolic pressure. The estimated right ventricular systolic pressure is 99.3 mmHg.  3. Left atrial size was upper normal.  4. Right atrial size was upper normal.  5. The mitral valve is grossly normal. Trivial mitral valve regurgitation.  6. The aortic valve is tricuspid. Aortic valve regurgitation is not visualized.  7. The inferior vena cava is  dilated in size with >50% respiratory variability, suggesting right atrial pressure of 8 mmHg. FINDINGS  Left Ventricle: Left ventricular ejection fraction, by estimation, is 50%. The left ventricle has low normal function. The left ventricle demonstrates global hypokinesis. The left ventricular internal cavity size was normal in size. There is mild left ventricular hypertrophy. The interventricular septum is flattened in systole and diastole, consistent with right ventricular pressure and volume overload. Left ventricular diastolic parameters are consistent with Grade I diastolic dysfunction (impaired relaxation). Right Ventricle: The right ventricular size is moderately enlarged. No increase in right ventricular wall thickness. Right ventricular systolic function is moderately reduced. There is mildly elevated pulmonary artery systolic pressure. The tricuspid regurgitant velocity is 2.65 m/s, and with an assumed right atrial pressure of 8 mmHg, the estimated right ventricular systolic pressure is 71.6 mmHg. Left Atrium: Left atrial size was upper normal. Right Atrium: Right atrial size was upper normal. Pericardium: There is no evidence of pericardial effusion. Mitral Valve: The mitral valve is grossly normal. Mild mitral annular calcification. Trivial mitral valve regurgitation. Tricuspid Valve: The tricuspid valve is grossly normal. Tricuspid valve regurgitation is mild. Aortic Valve: The aortic valve is tricuspid. Aortic valve regurgitation is not visualized. Mild aortic valve annular calcification. Pulmonic Valve: The pulmonic valve was grossly normal. Pulmonic valve regurgitation is trivial. Aorta: The aortic root is normal in size and structure. Venous: The inferior vena cava is dilated in size with greater than 50% respiratory variability, suggesting right atrial pressure of 8 mmHg. IAS/Shunts: No atrial level shunt detected by color flow Doppler.  LEFT VENTRICLE PLAX 2D LVIDd:         4.17 cm  Diastology  LVIDs:         3.17 cm  LV e' lateral:   4.70 cm/s LV PW:         1.23 cm  LV E/e' lateral: 11.8 LV IVS:        0.85 cm  LV e' medial:    3.73 cm/s LVOT diam:     1.80 cm  LV E/e' medial:  14.8 LVOT Area:     2.54 cm  RIGHT VENTRICLE RV S prime:     12.00 cm/s TAPSE (M-mode): 2.0 cm LEFT ATRIUM             Index LA diam:        3.40 cm 1.74 cm/m LA Vol (A2C):   42.3 ml 21.65 ml/m LA Vol (A4C):   66.2 ml 33.88 ml/m LA Biplane Vol: 56.8 ml 29.07 ml/m   AORTA Ao Root diam: 3.30 cm MITRAL VALVE               TRICUSPID VALVE MV Area (PHT): 3.17 cm    TR Peak grad:   28.1 mmHg  MV Decel Time: 239 msec    TR Vmax:        265.00 cm/s MV E velocity: 55.30 cm/s MV A velocity: 84.00 cm/s  SHUNTS MV E/A ratio:  0.66        Systemic Diam: 1.80 cm Rozann Lesches MD Electronically signed by Rozann Lesches MD Signature Date/Time: 05/26/2019/4:10:43 PM    Final      Assessment & Plan    1. Acute on Chronic Diastolic CHF Exacerbation/ RV Dysfunction - presented with AMS and found to be hypoxic with saturations at 65% on RA. Remains on BiPAP at this time but planning to transition to [place] this AM.  - Initial ABG showed pH 7.287, pCO2 63 and O2 133. BNP 327. CXR showed diffuse peribronchial cuffing consistent with acute bronchitis along with mild cardiomegaly and aortic atherosclerosis.  - she has been started on IV Lasix 32m BID and would continue for now. Weight recorded as 186 lbs this AM (previously 179 lbs at the time of hospital discharge in 01/2019). She does not follow daily weights at home. Will review the importance of limiting sodium intake once off BiPAP and able to have a more comfortable conversation. She was on Lasix 40 mg in AM/248min PM prior to admission so would anticipate titration of this to 4028mID at the time of discharge.   2. Elevated Troponin Values - Initial HS Troponin 254 with repeat values of 266 and 224. EKG on admission showed NSR, HR 71 with LAD and nonspecific ST abnormality along  the inferior leads which is similar to prior tracings. - repeat echo shows a low-normal EF of 50% with global HK and mild LVH with Grade 1 DD. RV function was moderately reduced and she did have trivial MR. EF overall similar to prior tracings as this was 50-55% by imaging in 06/2017 and 07/2018. - her dyspnea continues to improve with treatment of her COPD and CHF. She denies any recent chest pain. Suspect enzyme elevation is secondary to demand ischemia in the setting of her hypoxia. Would not anticipate further ischemic testing at this time.  - she did have coronary calcifications by prior CT so would continue ASA 67m78mily. Not on statin therapy prior to admission and will check FLP with AM labs. Would not start BB therapy given her COPD and baseline HR in the 50's and occasional escape beats noted.   3. COPD Exacerbation - oxygen saturations initially in the 60's on RA. Currently on BiPAP and I spoke with RT and they are planning to wean to [place] this AM. Remains on Doxycycline, IV steroids and scheduled nebulizer treatments. Further management per admitting team.   4. HTN - BP has been variable at 115/61 - 158/109 since admission. Was on Amlodipine 10 mg daily and Olmesartan 40 mg daily PTA with Olmesartan held on admission. Given stable renal function, would plan to resume Olmesartan today or tomorrow pending BP trend.   5. Tobacco Use - she continues to smoke ~ 0.5 ppd. Cessation advised.      For questions or updates, please contact [facility] consult [email or web address] for contact info under Cardiology/STEMI.  Signed, BritErma Heritage-C 05/27/2019, 8:41 AM Pager: [phone] note:  Patient seen and examined.  I reviewed her records and discussed the case with MarisaStrader PA-C, I agree with her findings.  Ms. CummGarretsonsents with acute hypoxic respiratory failure, states she had been short of breath over the weekend, was outdoors for a funeral  and having  trouble with the pollen.  States that she was told her face and legs looked swollen on Sunday.  She does use MDIs for COPD.  Denies any chest pain or palpitations.  The presenting chest x-ray was suggestive of bronchitis, no definite pulmonary edema.  She was treated with BiPAP overnight, transitioned to nasal cannula this morning.  On examination she is in no distress.  She is afebrile, heart rate is in the 60s to 70s in sinus rhythm by telemetry which I personally reviewed.  Systolic blood pressure in the 130s to 140s.  Lungs exhibit decreased breath sounds with mild expiratory wheeze and scattered rhonchi.  Cardiac exam RRR and no gallop.  Trace leg edema.  Lab work is outlined above.  Pertinent findings include ABG with recent PCO2 59, PO2 68.  Creatinine 0.87, hemoglobin 12.6, high-sensitivity troponin I levels in relatively flat pattern in the 200s.  SARS coronavirus 2 test negative.  Follow-up echocardiogram shows LVEF approximately 50% with mild global hypokinesis, mild diastolic dysfunction, moderate RV dysfunction.  Elevated high-sensitivity troponin I levels most consistent with demand ischemia in the setting of acute hypoxic respiratory failure and likely COPD exacerbation.  May be some associated component of acute on chronic diastolic heart failure, but does not appear markedly fluid overloaded.  Would focus on pulmonary status/stabilization.  No further ischemic work-up is planned at this time.  Continue IV Lasix for now, follow-up urine output and BMET.  Otherwise continue aspirin, consider emperic statin given prior documentation of atherosclerosis by CT imaging.  Satira Sark, M.D., F.A.C.C.

## 2019-05-28 DIAGNOSIS — J9622 Acute and chronic respiratory failure with hypercapnia: Secondary | ICD-10-CM | POA: Diagnosis not present

## 2019-05-28 DIAGNOSIS — J9621 Acute and chronic respiratory failure with hypoxia: Secondary | ICD-10-CM | POA: Diagnosis not present

## 2019-05-28 DIAGNOSIS — I5033 Acute on chronic diastolic (congestive) heart failure: Secondary | ICD-10-CM | POA: Diagnosis not present

## 2019-05-28 LAB — LIPID PANEL
Cholesterol: 159 mg/dL (ref 0–200)
HDL: 32 mg/dL — ABNORMAL LOW (ref >40)
LDL Cholesterol: 109 mg/dL — ABNORMAL HIGH (ref 0–99)
Total CHOL/HDL Ratio: 5 RATIO
Triglycerides: 92 mg/dL (ref <150)
VLDL: 18 mg/dL (ref 0–40)

## 2019-05-28 MED ORDER — POTASSIUM CHLORIDE CRYS ER 20 MEQ PO TBCR: 20.0000 meq | EXTENDED_RELEASE_TABLET | Freq: Every day | ORAL | 2 refills | Status: DC

## 2019-05-28 MED ORDER — ASPIRIN EC 81 MG PO TBEC: 81.0000 mg | DELAYED_RELEASE_TABLET | Freq: Every day | ORAL | 11 refills | Status: DC

## 2019-05-28 MED ORDER — AMLODIPINE BESYLATE 10 MG PO TABS: 10.0000 mg | ORAL_TABLET | Freq: Every day | ORAL | 5 refills | Status: DC

## 2019-05-28 MED ORDER — PROAIR HFA 108 (90 BASE) MCG/ACT IN AERS: 2.0000 | INHALATION_SPRAY | RESPIRATORY_TRACT | 5 refills | Status: DC | PRN

## 2019-05-28 MED ORDER — MOMETASONE FURO-FORMOTEROL FUM 200-5 MCG/ACT IN AERO: 2.0000 | INHALATION_SPRAY | Freq: Two times a day (BID) | RESPIRATORY_TRACT | 12 refills | Status: DC

## 2019-05-28 MED ORDER — FUROSEMIDE 40 MG PO TABS: 40.0000 mg | ORAL_TABLET | Freq: Two times a day (BID) | ORAL | 3 refills | Status: DC

## 2019-05-28 MED ORDER — FUROSEMIDE 40 MG PO TABS
40.0000 mg | ORAL_TABLET | Freq: Two times a day (BID) | ORAL | Status: DC
  Administered 2019-05-28: 10:00:00 40 mg via ORAL
  Filled 2019-05-28: qty 1

## 2019-05-28 MED ORDER — GUAIFENESIN ER 600 MG PO TB12: 600.0000 mg | ORAL_TABLET | Freq: Two times a day (BID) | ORAL | 0 refills | Status: DC

## 2019-05-28 MED ORDER — POTASSIUM CHLORIDE CRYS ER 20 MEQ PO TBCR
20.0000 meq | EXTENDED_RELEASE_TABLET | Freq: Every day | ORAL | Status: DC
  Administered 2019-05-28: 10:00:00 20 meq via ORAL
  Filled 2019-05-28: qty 1

## 2019-05-28 MED ORDER — DOXYCYCLINE HYCLATE 100 MG PO TABS: 100.0000 mg | ORAL_TABLET | Freq: Two times a day (BID) | ORAL | 0 refills | Status: AC

## 2019-05-28 MED ORDER — ALBUTEROL SULFATE (2.5 MG/3ML) 0.083% IN NEBU: 2.5000 mg | INHALATION_SOLUTION | Freq: Four times a day (QID) | RESPIRATORY_TRACT | 12 refills | Status: DC | PRN

## 2019-05-28 MED ORDER — ATORVASTATIN CALCIUM 20 MG PO TABS: 20.0000 mg | ORAL_TABLET | Freq: Every day | ORAL | 5 refills | Status: DC

## 2019-05-28 NOTE — TOC Transition Note (Signed)
Transition of Care [facility]) - CM/SW Discharge Note   Patient Details  Name: Marisa Bailey MRN: [id number] Date of Birth: 1940-08-14  [facility] CM/SW Contact:  Ihor Gully, LCSW Phone Number: 05/28/2019, 1:54 PM   Clinical Narrative:    Patient discharging home. Aware of need to wear home oxygen. Unsure is she has CPAP at home had a device on her table but does not know what it is.  Oxygen ordered to through [employer] and delivered to room.  TOC signing off.    Final next level of care: Home/Self Care Barriers to Discharge: No Barriers Identified   Patient Goals and CMS Choice        Discharge Placement                       Discharge Plan and Services                DME Arranged: Oxygen DME Agency: [employer] Date DME Agency Contacted: 05/28/19 Time DME Agency Contacted: [phone] Representative spoke with at DME Agency: [place] ([place]) Interventions     Readmission Risk Interventions Readmission Risk Prevention Plan 08/04/2018  Transportation Screening Complete  Some recent data might be hidden

## 2019-05-28 NOTE — Plan of Care (Signed)

## 2019-05-28 NOTE — Care Management Important Message (Signed)
Important Message  Patient Details  Name: Marisa Bailey MRN: [id number] Date of Birth: 1940/03/01   Medicare Important Message Given:  Yes     [name] 05/28/2019, 12:42 PM

## 2019-05-28 NOTE — Progress Notes (Signed)
Nsg Discharge Note  Admit Date:  05/26/2019 Discharge date: 05/28/2019   Marisa Bailey to be D/C'd Home per MD order.  AVS completed.  Copy for chart, and copy for patient signed, and dated. Patient/caregiver able to verbalize understanding.  Discharge Medication: Allergies as of 05/28/2019      Reactions   Penicillins Rash   Has patient had a PCN reaction causing immediate rash, facial/tongue/throat swelling, SOB or lightheadedness with hypotension: {Yes/No:[id number] Has patient had a PCN reaction causing severe rash involving mucus membranes or skin necrosis: Yes Has patient had a PCN reaction that required hospitalization No Has patient had a PCN reaction occurring within the last 10 years: No If all of the above answers are "NO", then may proceed with Cephalosporin use. **Has tolerated keflex      Medication List    STOP taking these medications   budesonide-formoterol 160-4.5 MCG/ACT inhaler Commonly known as: Symbicort Replaced by: mometasone-formoterol 200-5 MCG/ACT Aero     TAKE these medications   acetaminophen 325 MG tablet Commonly known as: TYLENOL Take 2 tablets (650 mg total) by mouth every 6 (six) hours as needed for mild pain (or Fever >/= 101).   albuterol (2.5 MG/3ML) 0.083% nebulizer solution Commonly known as: PROVENTIL Take 3 mLs (2.5 mg total) by nebulization every 6 (six) hours as needed for wheezing or shortness of breath. What changed:   Another medication with the same name was changed. Make sure you understand how and when to take each.  Another medication with the same name was removed. Continue taking this medication, and follow the directions you see here.   ProAir HFA 108 (90 Base) MCG/ACT inhaler Generic drug: albuterol Inhale 2 puffs into the lungs every 4 (four) hours as needed for wheezing or shortness of breath. What changed:   how much to take  Another medication with the same name was removed. Continue taking this medication, and  follow the directions you see here.   amLODipine 10 MG tablet Commonly known as: NORVASC Take 1 tablet (10 mg total) by mouth daily.   aspirin EC 81 MG tablet Take 1 tablet (81 mg total) by mouth daily with breakfast. What changed: when to take this   atorvastatin 20 MG tablet Commonly known as: LIPITOR Take 1 tablet (20 mg total) by mouth daily. Start taking on: May 29, 2019   cetirizine 10 MG tablet Commonly known as: ZYRTEC Take 10 mg by mouth daily.   doxycycline 100 MG tablet Commonly known as: VIBRA-TABS Take 1 tablet (100 mg total) by mouth 2 (two) times daily for 5 days.   folic acid 1 MG tablet Commonly known as: FOLVITE Take 1 tablet (1 mg total) by mouth daily.   furosemide 40 MG tablet Commonly known as: LASIX Take 1 tablet (40 mg total) by mouth 2 (two) times daily. What changed:   when to take this  additional instructions   guaiFENesin 600 MG 12 hr tablet Commonly known as: MUCINEX Take 1 tablet (600 mg total) by mouth 2 (two) times daily.   mometasone-formoterol 200-5 MCG/ACT Aero Commonly known as: DULERA Inhale 2 puffs into the lungs 2 (two) times daily. Replaces: budesonide-formoterol 160-4.5 MCG/ACT inhaler   olmesartan 40 MG tablet Commonly known as: BENICAR Take 1 tablet (40 mg total) by mouth daily.   potassium chloride SA 20 MEQ tablet Commonly known as: KLOR-CON Take 1 tablet (20 mEq total) by mouth daily. Start taking on: May 29, 2019   predniSONE 20 MG tablet Commonly known  as: Deltasone Take 2 tablets (40 mg total) by mouth daily with breakfast.   Vitamin D3 1.25 MG (50000 UT) Tabs Take 1 tablet by mouth daily.   Zinc 50 MG Caps Take 1 capsule by mouth daily.            Durable Medical Equipment  (From admission, onward)         Start     Ordered   05/28/19 1131  For home use only DME oxygen  Once    Comments: SATURATION QUALIFICATIONS: (This note is used to comply with regulatory documentation for home  oxygen)  Patient Saturations on Room Air at Rest = 92%  Patient Saturations on Room Air while Ambulating = 76%  Patient Saturations on 3 Liters of oxygen while Ambulating = 93%  Please briefly explain why patient needs home oxygen: Patient desaturates to below 90% with minimal activity without supplemental oxygenation; decreasing as far a 72% with short distance ambulation and activity within her hospital room. Patient is able to ambulate with saturations above 90% on 3 LPM of supplemental oxgygen.   Question Answer Comment  Length of Need Lifetime   Mode or (Route) Nasal cannula   Liters per Minute 3   Frequency Continuous (stationary and portable oxygen unit needed)   Oxygen conserving device Yes   Oxygen delivery system Gas      05/28/19 1130          Discharge Assessment: Vitals:   05/28/19 1221 05/28/19 1300  BP: 140/64 138/66  Pulse: 71 67  Resp: 19 18  Temp: 98.5 F (36.9 C) 98.7 F (37.1 C)  SpO2: 93% 94%   Skin clean, dry and intact without evidence of skin break down, no evidence of skin tears noted. IV catheter discontinued intact. Site without signs and symptoms of complications - no redness or edema noted at insertion site, patient denies c/o pain - only slight tenderness at site.  Dressing with slight pressure applied.  D/c Instructions-Education: Discharge instructions given to patient/family with verbalized understanding. D/c education completed with patient/family including follow up instructions, medication list, d/c activities limitations if indicated, with other d/c instructions as indicated by MD - patient able to verbalize understanding, all questions fully answered. Patient instructed to return to ED, call 911, or call MD for any changes in condition.  Patient escorted via [place], and D/C home via private auto.  Zenaida Deed, RN 05/28/2019 2:36 PM

## 2019-05-28 NOTE — Discharge Instructions (Signed)
1)Very low-salt diet advised 2)Weigh yourself daily, call if you gain more than 3 pounds in 1 day or more than 5 pounds in 1 week as your diuretic medications may need to be adjusted 3)Limit your Fluid  intake to no more than 60 ounces (1.8 Liters) per day 4)you need oxygen at home at 3 L via nasal cannula continuously while awake and while asleep--- smoking or having open fires around oxygen can cause fire, significant injury and death 5) please follow-up with pulmonologist Dr. Halford Chessman for further investigation/work-up for possible sleep apnea 6) you will need CPAP machine every time you go to bed/fall asleep

## 2019-05-28 NOTE — Progress Notes (Signed)
Progress Note  Patient Name: Marisa Bailey Date of Encounter: 05/28/2019  Consulting Cardiologist: Satira Sark, MD  Subjective   Eating breakfast, no reported chest pain or palpitations, not short of breath.  Used BiPAP overnight per respiratory.  Inpatient Medications    Scheduled Meds:  amLODipine  10 mg Oral Daily   aspirin EC  81 mg Oral Daily   atorvastatin  20 mg Oral Daily   chlorhexidine  15 mL Mouth Rinse BID   Chlorhexidine Gluconate Cloth  6 each Topical Daily   doxycycline  100 mg Oral T59R   folic acid  1 mg Oral Daily   furosemide  40 mg Intravenous BID   guaiFENesin  600 mg Oral BID   heparin  5,000 Units Subcutaneous Q8H   mouth rinse  15 mL Mouth Rinse q12n4p   methylPREDNISolone (SOLU-MEDROL) injection  40 mg Intravenous Q8H   mometasone-formoterol  2 puff Inhalation BID   sodium chloride flush  3 mL Intravenous Q12H   Continuous Infusions:  sodium chloride     PRN Meds: sodium chloride, albuterol, ondansetron **OR** ondansetron (ZOFRAN) IV, polyethylene glycol, sodium chloride flush, traZODone   Vital Signs    Vitals:   05/28/19 0449 05/28/19 0816 05/28/19 0837 05/28/19 0840  BP: (!) 153/66 (!) 139/59    Pulse: (!) 53     Resp: (!) 22     Temp: 98.7 F (37.1 C)     TempSrc: Oral     SpO2: 98%  91% 91%  Weight:      Height:        Intake/Output Summary (Last 24 hours) at 05/28/2019 0851 Last data filed at 05/28/2019 0500 Gross per 24 hour  Intake 480 ml  Output 1200 ml  Net -720 ml   Filed Weights   05/26/19 1427 05/27/19 0500 05/28/19 0152  Weight: 83.7 kg 84.4 kg 84.4 kg    Telemetry    Sinus rhythm.  Personally reviewed.  Physical Exam   GEN:  Elderly woman.  No acute distress.  Oxygen via nasal cannula. Neck: No JVD. Cardiac: RRR, no gallop.  Respiratory:  Decreased breath sounds with scattered rhonchi. GI: Soft, nontender, bowel sounds present. MS: No edema; No deformity.  Labs     Chemistry Recent Labs  Lab 05/26/19 1000 05/27/19 0417  NA 139 140  K 4.0 4.5  CL 101 101  CO2 27 29  GLUCOSE 110* 121*  BUN 17 21  CREATININE 0.97 0.87  CALCIUM 8.4* 8.3*  GFRNONAA 56* >60  GFRAA >60 >60  ANIONGAP 11 10     Hematology Recent Labs  Lab 05/26/19 0956 05/27/19 0417  WBC 5.7 4.1  RBC 4.40 4.51  HGB 12.3 12.6  HCT 42.8 43.7  MCV 97.3 96.9  MCH 28.0 27.9  MCHC 28.7* 28.8*  RDW 16.2* 16.1*  PLT 259 239    Cardiac Enzymes Recent Labs  Lab 05/26/19 1000 05/26/19 1204 05/26/19 1536 05/26/19 1737  TROPONINIHS 254* 266* 224* 208*    BNP Recent Labs  Lab 05/26/19 1000  BNP 327.0*     Radiology    DG Chest Port 1 View  Result Date: 05/26/2019 CLINICAL DATA:  79 year old female with history of shortness of breath this morning. EXAM: PORTABLE CHEST 1 VIEW COMPARISON:  Chest x-ray 01/13/2019. FINDINGS: Lung volumes are normal. Diffuse peribronchial cuffing, most evident throughout the mid to lower lungs bilaterally. No consolidative airspace disease. No pleural effusions. No evidence of pulmonary edema. Mild cardiomegaly. Upper mediastinal contours  are within normal limits. Aortic atherosclerosis. IMPRESSION: 1. Diffuse peribronchial cuffing concerning for an acute bronchitis. 2. Mild cardiomegaly. 3. Aortic atherosclerosis. Electronically Signed   By: Vinnie Langton M.D.   On: 05/26/2019 10:14   ECHOCARDIOGRAM COMPLETE  Result Date: 05/26/2019    ECHOCARDIOGRAM REPORT   Patient Name:   Merranda E Goetzinger Date of Exam: 05/26/2019 Medical Rec #:  [id number]      Height:       67.0 in Accession #:    [id number]     Weight:       184.5 lb Date of Birth:  October 18, 1940       BSA:          1.954 m Patient Age:    24 years       BP:           139/64 mmHg Patient Gender: F              HR:           61 bpm. Exam Location:  [facility] Procedure: 2D Echo Indications:    Abnormal ECG 794.31 / R94.31  History:        Patient has prior history of Echocardiogram examinations,  most                 recent 08/02/2018. CHF, COPD; Risk Factors:Hypertension and                 Current Smoker. Acute Respiratory failure with hypoxia.  Sonographer:    Leavy Cella RDCS (AE) Referring Phys: [id number] COURAGE EMOKPAE IMPRESSIONS  1. Left ventricular ejection fraction, by estimation, is 50%. The left ventricle has low normal function. The left ventricle demonstrates global hypokinesis. There is mild left ventricular hypertrophy. Left ventricular diastolic parameters are consistent with Grade I diastolic dysfunction (impaired relaxation). There is the interventricular septum is flattened in systole and diastole, consistent with right ventricular pressure and volume overload.  2. Right ventricular systolic function is moderately reduced. The right ventricular size is moderately enlarged. There is mildly elevated pulmonary artery systolic pressure. The estimated right ventricular systolic pressure is 37.1 mmHg.  3. Left atrial size was upper normal.  4. Right atrial size was upper normal.  5. The mitral valve is grossly normal. Trivial mitral valve regurgitation.  6. The aortic valve is tricuspid. Aortic valve regurgitation is not visualized.  7. The inferior vena cava is dilated in size with >50% respiratory variability, suggesting right atrial pressure of 8 mmHg. FINDINGS  Left Ventricle: Left ventricular ejection fraction, by estimation, is 50%. The left ventricle has low normal function. The left ventricle demonstrates global hypokinesis. The left ventricular internal cavity size was normal in size. There is mild left ventricular hypertrophy. The interventricular septum is flattened in systole and diastole, consistent with right ventricular pressure and volume overload. Left ventricular diastolic parameters are consistent with Grade I diastolic dysfunction (impaired relaxation). Right Ventricle: The right ventricular size is moderately enlarged. No increase in right ventricular wall thickness. Right  ventricular systolic function is moderately reduced. There is mildly elevated pulmonary artery systolic pressure. The tricuspid regurgitant velocity is 2.65 m/s, and with an assumed right atrial pressure of 8 mmHg, the estimated right ventricular systolic pressure is 06.2 mmHg. Left Atrium: Left atrial size was upper normal. Right Atrium: Right atrial size was upper normal. Pericardium: There is no evidence of pericardial effusion. Mitral Valve: The mitral valve is grossly normal. Mild mitral annular calcification. Trivial mitral valve regurgitation. Tricuspid  Valve: The tricuspid valve is grossly normal. Tricuspid valve regurgitation is mild. Aortic Valve: The aortic valve is tricuspid. Aortic valve regurgitation is not visualized. Mild aortic valve annular calcification. Pulmonic Valve: The pulmonic valve was grossly normal. Pulmonic valve regurgitation is trivial. Aorta: The aortic root is normal in size and structure. Venous: The inferior vena cava is dilated in size with greater than 50% respiratory variability, suggesting right atrial pressure of 8 mmHg. IAS/Shunts: No atrial level shunt detected by color flow Doppler.  LEFT VENTRICLE PLAX 2D LVIDd:         4.17 cm  Diastology LVIDs:         3.17 cm  LV e' lateral:   4.70 cm/s LV PW:         1.23 cm  LV E/e' lateral: 11.8 LV IVS:        0.85 cm  LV e' medial:    3.73 cm/s LVOT diam:     1.80 cm  LV E/e' medial:  14.8 LVOT Area:     2.54 cm  RIGHT VENTRICLE RV S prime:     12.00 cm/s TAPSE (M-mode): 2.0 cm LEFT ATRIUM             Index LA diam:        3.40 cm 1.74 cm/m LA Vol (A2C):   42.3 ml 21.65 ml/m LA Vol (A4C):   66.2 ml 33.88 ml/m LA Biplane Vol: 56.8 ml 29.07 ml/m   AORTA Ao Root diam: 3.30 cm MITRAL VALVE               TRICUSPID VALVE MV Area (PHT): 3.17 cm    TR Peak grad:   28.1 mmHg MV Decel Time: 239 msec    TR Vmax:        265.00 cm/s MV E velocity: 55.30 cm/s MV A velocity: 84.00 cm/s  SHUNTS MV E/A ratio:  0.66        Systemic Diam: 1.80  cm Rozann Lesches MD Electronically signed by Rozann Lesches MD Signature Date/Time: 05/26/2019/4:10:43 PM    Final     Patient Profile     79 y.o. female with past medical history of chronic diastolic CHF (EF 30-09% by echo imaging in 06/2017 and 07/2018), coronary calcifications by prior CT, COPD, HTN and tobacco use   Assessment & Plan    1.  Minor and relatively flat elevation in high-sensitivity troponin I in the 200s, not suggestive of ACS.  Consistent with demand ischemia.  2.  Acute on chronic hypoxic and hypercarbic respiratory failure in the setting of COPD exacerbation, possibly some component of acute on chronic diastolic heart failure.  Clinically improving.  Echocardiogram shows LVEF approximately 50% with mild diastolic dysfunction, also moderately reduced RV function with mildly elevated PASP.  3.  Coronary and aortic atherosclerosis based on CT imaging over time.  No active angina symptoms and ECG nonspecific.  She is on aspirin and statin at this point for risk reduction.  [facility] will sign off.   Medication Recommendations:  Patient has diuresed on IV Lasix, net negative over the last 24 hours but total output incomplete.  Will change to oral Lasix 40 mg twice daily for discharge dose and add potassium supplement.  Otherwise continue aspirin, Norvasc, and Lipitor at current doses. Other recommendations (labs, testing, etc):  No additional cardiac testing is planned at this time. Follow up as an outpatient:   Keep follow-up with PCP, cardiology follow-up can be as needed.  Signed, Mikeal Hawthorne  Domenic Polite, MD  05/28/2019, 8:51 AM

## 2019-05-28 NOTE — Progress Notes (Signed)
CSW called Juliann Pulse with [employer] to make a referral for home O2. [employer] accepted referral.  Tobi Bastos, LCSW Transitions of Care Clinical Social Worker [facility] Ph: [phone]

## 2019-07-02 NOTE — Discharge Summary (Signed)
Marisa Bailey, is a 79 y.o. female  DOB 1940-08-15  MRN [id number].  Admission date:  05/26/2019  Admitting Physician  Calina Patrie Denton Brick, MD  Discharge Date:  07/02/2019   Primary MD  Lucia Gaskins, MD  Recommendations for primary care physician for things to follow:   1)Very low-salt diet advised 2)Weigh yourself daily, call if you gain more than 3 pounds in 1 day or more than 5 pounds in 1 week as your diuretic medications may need to be adjusted 3)Limit your Fluid  intake to no more than 60 ounces (1.8 Liters) per day 4)you need oxygen at home at 3 L via nasal cannula continuously while awake and while asleep--- smoking or having open fires around oxygen can cause fire, significant injury and death 5) please follow-up with pulmonologist Dr. Halford Chessman for further investigation/work-up for possible sleep apnea 6) you will need CPAP machine every time you go to bed/fall asleep  Admission Diagnosis  Respiratory failure with hypoxia ([place]) [J96.91] Acute on chronic respiratory failure with hypoxia and hypercapnia (HCC) [Q19.75, J96.22]   Discharge Diagnosis  Respiratory failure with hypoxia ([place]) [J96.91] Acute on chronic respiratory failure with hypoxia and hypercapnia (HCC) [O83.25, J96.22]    Principal Problem:   Acute on chronic respiratory failure with hypoxia and hypercapnia (HCC) Active Problems:   Acute Respiratory failure with hypoxia (HCC)   COPD (chronic obstructive pulmonary disease) ([place])   Hypertension   Tobacco abuse      Past Medical History:  Diagnosis Date  . Bronchitis   . COPD (chronic obstructive pulmonary disease) ([place])   . Hypertension     Past Surgical History:  Procedure Laterality Date  . ENUCLEATION       HPI  from the history and physical done on the day of admission:    Marisa Bailey  is a 79 y.o. female smoker with medical history significant forright eye vision  loss, COPD, dCHF, HTN, as well as history of chronic hypoxic respiratory failure but noncompliant with oxygen who presents to the ED with significant shortness of breath, tachypnea, labored breathing and wheezing-- -O2 sat was as low as 65% in the ED patient initially required a nonrebreather bag and was placed on BiPAP--- very disoriented and lethargic -History is limited by patient denies fever -She has had some cough at times productive -Continues to smoke  -Patient was previously on home O2 but then she has not been using it for quite a while -Additional history obtained from female relative at bedside  --Elevated troponins noted -Mildly attended a funeral last weekend and has not felt well since then she has had some cough, and some anterior abdominal wall swelling apparently -Denies chest pains, no leg pains or pleuritic symptoms -Chest x-ray suggestive of acute bronchitis and mild cardiomegaly Troponin 254>>266>>224--- EDP discussed elevated troponin with cardiology service recommends serial troponin -Echo with EF of 50%, with global hypokinesis and mild LVH, and grade 1 diastolic dysfunction -BNP is [facility]:    Brief Summary:- 79 y.o.femalewith  past medical history of chronic diastolic CHF (EF 78-29% by echo imaging in 06/2017 and 07/2018), coronary calcifications by prior CT, COPD, HTN and tobacco use admitted on 05/26/2019 with acute on chronic hypoxic and hypercapnic respiratory failure with altered mentation in the setting of COPD exacerbation and found to have elevated troponin and BNP  A/p 1)Acute on chronic hypoxic and hypercapnic respiratory failure due to mostly COPD distribution, compounded by some degree of chronic diastolic dysfunction CHF exacerbation Much improved after continuous BiPAP use  -Treated with IV Solu-Medrol, continue bronchodilators, and doxycycline and mucolytics for COPD exacerbation -Repeat ABG  on nasal cannula after using BiPAP  overnight shows compensated hypoxic and hypercapnic respiratory failure -PTA patient was supposed to be on home O2 but she had stopped using it for a while --Follow-up as outpatient with Dr. Halford Chessman the pulmonologist for further work-up and possible CPAP -Continue home O2 at 3 L/min continuously via nasal cannula  2)HFpEF--- acute on chronic diastolic dysfunction CHF, dyspnea and hypoxia improving slowly  -Elevated troponin and BNP noted on admission -Troponin-- 254>>266>>224>>208 BNP 327 -PTA patient was taking Lasix 40 mg and a 20 mg the evening -Prior weight around 180 pounds from December 2020, current weight 186 pounds Treated with IV Lasix 40 twice daily with fluid input and output monitoring and daily weights -Repeat echo on 05/26/2019 with-EF of 50%, with global hypokinesis and mild LVH,and grade 1 diastolic dysfunction - Prior echo from 08/02/18 with EF 50 to55% - discussed with cardiology service no further ischemia work-up pending at this time -coronary calcifications by prior CT so would continue ASA 29m daily and start Lipitor --Much improved after diuresis -Salt and fluid restriction advised -Discharge on Lasix 40 twice daily  3) acute COPD exacerbation--- management as above #1 Improving after continuous BiPAP use overnight, okay to continue BiPAP nightly -Manage as above #1  4)Ongoing tobacco abuse--- currently smoking at least half a pack a day, nicotine patch is ordered smoking cessation advised -  5)HTN-continue amlodipine, and olmesartan   6) acute metabolic encephalopathy--- secondary to hypercapnia as above #1, -Resolved after prolonged BiPAP use  Disposition--- discharge home with home oxygenCode Status : full  Family Communication:  (patient is alert, awake and coherent)  Consults  : Cardiology  Discharge Condition: Stable  Follow UP  Follow-up Information    SChesley Mires MD. Schedule an appointment as soon as possible for a visit in 1  week(s).   Specialty: Pulmonary Disease [facility] [phone]          Diet and Activity recommendation:  As advised  Discharge Instructions    Discharge Instructions    Call MD for:  difficulty breathing, headache or visual disturbances   Complete by: As directed    Call MD for:  persistant dizziness or light-headedness   Complete by: As directed    Call MD for:  persistant nausea and vomiting   Complete by: As directed    Call MD for:  temperature >100.4   Complete by: As directed    Diet - low sodium heart healthy   Complete by: As directed    Discharge instructions   Complete by: As directed    1)Very low-salt diet advised 2)Weigh yourself daily, call if you gain more than 3 pounds in 1 day or more than 5 pounds in 1 week as your diuretic medications may need to be adjusted 3)Limit your Fluid  intake to no more than 60 ounces (1.8 Liters) per day 4)you need oxygen at home at 3 L via nasal cannula continuously while awake and while asleep--- smoking or having open fires around oxygen can cause fire, significant injury and death 5) please follow-up with pulmonologist Dr. Halford Chessman for further investigation/work-up for possible sleep apnea   Increase activity slowly   Complete by: As directed       Discharge Medications     Allergies as of 05/28/2019      Reactions   Penicillins Rash   Has patient had a PCN reaction causing immediate rash, facial/tongue/throat swelling, SOB or lightheadedness with hypotension: {Yes/No:[id number] Has patient had a PCN reaction causing severe rash involving mucus membranes or skin necrosis: Yes Has patient had a PCN reaction that required hospitalization No Has patient had a PCN reaction occurring within the last 10 years: No If all of the above answers are "NO", then may proceed with Cephalosporin use. **Has tolerated keflex       Medication List    STOP taking these medications   budesonide-formoterol 160-4.5 MCG/ACT inhaler Commonly known as: Symbicort Replaced by: mometasone-formoterol 200-5 MCG/ACT Aero     TAKE these medications   acetaminophen 325 MG tablet Commonly known as: TYLENOL Take 2 tablets (650 mg total) by mouth every 6 (six) hours as needed for mild pain (or Fever >/= 101).   albuterol (2.5 MG/3ML) 0.083% nebulizer solution Commonly known as: PROVENTIL Take 3 mLs (2.5 mg total) by nebulization every 6 (six) hours as needed for wheezing or shortness of breath. What changed:   Another medication with the same name was changed. Make sure you understand how and when to take each.  Another medication with the same name was removed. Continue taking this medication, and follow the directions you see here.   ProAir HFA 108 (90 Base) MCG/ACT inhaler Generic drug: albuterol Inhale 2 puffs into the lungs every 4 (four) hours as needed for wheezing or shortness of breath. What changed:   how much to take  Another medication with the same name was removed. Continue taking this medication, and follow the directions you see here.   amLODipine 10 MG tablet Commonly known as: NORVASC Take 1 tablet (10 mg total) by mouth daily.   aspirin EC 81 MG tablet Take 1 tablet (81 mg total) by mouth daily with breakfast. What changed: when to take this   atorvastatin 20 MG tablet Commonly known as: LIPITOR Take 1 tablet (20 mg total) by mouth daily.   cetirizine 10 MG tablet Commonly known as: ZYRTEC Take 10 mg by mouth daily.   folic acid 1 MG tablet Commonly known as: FOLVITE Take 1 tablet (1 mg total) by mouth daily.   furosemide 40 MG tablet Commonly known as: LASIX Take 1 tablet (40 mg total) by mouth 2 (two) times daily. What changed:   when to take this  additional instructions   guaiFENesin 600 MG 12 hr tablet Commonly known as: MUCINEX Take 1 tablet (600 mg total) by mouth 2 (two)  times daily.   mometasone-formoterol 200-5 MCG/ACT Aero Commonly known as: DULERA Inhale 2 puffs into the lungs 2 (two) times daily. Replaces: budesonide-formoterol 160-4.5 MCG/ACT inhaler   olmesartan 40 MG tablet Commonly known as: BENICAR Take 1 tablet (40 mg total) by mouth daily.   potassium chloride SA 20 MEQ tablet Commonly known as: KLOR-CON Take 1 tablet (20 mEq total) by mouth daily.   predniSONE 20 MG tablet Commonly known as: Deltasone  Take 2 tablets (40 mg total) by mouth daily with breakfast.   Vitamin D3 1.25 MG (50000 UT) Tabs Take 1 tablet by mouth daily.   Zinc 50 MG Caps Take 1 capsule by mouth daily.     ASK your doctor about these medications   doxycycline 100 MG tablet Commonly known as: VIBRA-TABS Take 1 tablet (100 mg total) by mouth 2 (two) times daily for 5 days. Ask about: Should I take this medication?       Major procedures and Radiology Reports - PLEASE review detailed and final reports for all details, in brief -   No results found.  Micro Results   No results found for this or any previous visit (from the past 240 hour(s)).  Today   Subjective    Marisa Bailey today has no new complaints, -Alert and coherent, no shortness of breath at rest          Patient has been seen and examined prior to discharge   Objective   Blood pressure 138/66, pulse 67, temperature 98.7 F (37.1 C), temperature source Oral, resp. rate 18, height _0  (1.702 m), weight 84.4 kg, SpO2 94 %.  No intake or output data in the 24 hours ending 07/02/19 1844  Exam Gen:- Awake Alert, conversational dyspnea HEENT:- [place].AT, No sclera icterus  Nose- [place] 3L/min RT Eye-Vision Loss Neck-Supple Neck,No JVD,.  Lungs- improving air movement, no wheezing  CV- S1, S2 normal, regular  Abd-  +ve B.Sounds, Abd Soft, No tenderness,    Extremity/Skin:-no  edema, pedal pulses present  Psych-affect is appropriate, oriented x3 Neuro-  no new focal deficits, no  tremors   Data Review   CBC w Diff:  Lab Results  Component Value Date   WBC 4.1 05/27/2019   HGB 12.6 05/27/2019   HCT 43.7 05/27/2019   PLT 239 05/27/2019   LYMPHOPCT 21 05/26/2019   MONOPCT 7 05/26/2019   EOSPCT 6 05/26/2019   BASOPCT 1 05/26/2019    CMP:  Lab Results  Component Value Date   NA 140 05/27/2019   K 4.5 05/27/2019   CL 101 05/27/2019   CO2 29 05/27/2019   BUN 21 05/27/2019   CREATININE 0.87 05/27/2019   PROT 7.2 08/01/2018   ALBUMIN 3.4 (L) 08/01/2018   BILITOT 0.9 08/01/2018   ALKPHOS 85 08/01/2018   AST 16 08/01/2018   ALT 16 08/01/2018  . Total Discharge time is about 33 minutes  Roxan Hockey M.D on 07/02/2019 at 6:44 PM  Go to [email or web address] -  for contact info  [facility] - Office  [phone]

## 2019-11-23 ENCOUNTER — Emergency Department (HOSPITAL_COMMUNITY)
Admission: EM | Admit: 2019-11-23 | Discharge: 2019-11-23 | Disposition: A | Discharge status: Home / Self Care | Payer: Medicare Other | Attending: Emergency Medicine | Admitting: Emergency Medicine

## 2019-11-23 ENCOUNTER — Emergency Department (HOSPITAL_COMMUNITY): Payer: Medicare Other

## 2019-11-23 ENCOUNTER — Encounter (HOSPITAL_COMMUNITY): Payer: Self-pay | Admitting: *Deleted

## 2019-11-23 ENCOUNTER — Other Ambulatory Visit: Payer: Self-pay

## 2019-11-23 DIAGNOSIS — F17210 Nicotine dependence, cigarettes, uncomplicated: Secondary | ICD-10-CM | POA: Diagnosis not present

## 2019-11-23 DIAGNOSIS — I509 Heart failure, unspecified: Secondary | ICD-10-CM | POA: Insufficient documentation

## 2019-11-23 DIAGNOSIS — R21 Rash and other nonspecific skin eruption: Secondary | ICD-10-CM | POA: Diagnosis not present

## 2019-11-23 DIAGNOSIS — J449 Chronic obstructive pulmonary disease, unspecified: Secondary | ICD-10-CM | POA: Diagnosis not present

## 2019-11-23 DIAGNOSIS — R062 Wheezing: Secondary | ICD-10-CM | POA: Insufficient documentation

## 2019-11-23 DIAGNOSIS — Z7982 Long term (current) use of aspirin: Secondary | ICD-10-CM | POA: Diagnosis not present

## 2019-11-23 DIAGNOSIS — R600 Localized edema: Secondary | ICD-10-CM | POA: Insufficient documentation

## 2019-11-23 DIAGNOSIS — R109 Unspecified abdominal pain: Secondary | ICD-10-CM | POA: Diagnosis present

## 2019-11-23 DIAGNOSIS — I110 Hypertensive heart disease with heart failure: Secondary | ICD-10-CM | POA: Diagnosis not present

## 2019-11-23 DIAGNOSIS — Z79899 Other long term (current) drug therapy: Secondary | ICD-10-CM | POA: Insufficient documentation

## 2019-11-23 DIAGNOSIS — R220 Localized swelling, mass and lump, head: Secondary | ICD-10-CM

## 2019-11-23 LAB — CBC
HCT: 40.4 % (ref 36.0–46.0)
Hemoglobin: 12.4 g/dL (ref 12.0–15.0)
MCH: 28.8 pg (ref 26.0–34.0)
MCHC: 30.7 g/dL (ref 30.0–36.0)
MCV: 93.7 fL (ref 80.0–100.0)
Platelets: 283 10*3/uL (ref 150–400)
RBC: 4.31 MIL/uL (ref 3.87–5.11)
RDW: 13.3 % (ref 11.5–15.5)
WBC: 11.6 10*3/uL — ABNORMAL HIGH (ref 4.0–10.5)
nRBC: 0 % (ref 0.0–0.2)

## 2019-11-23 LAB — URINALYSIS, ROUTINE W REFLEX MICROSCOPIC
Bilirubin Urine: NEGATIVE
Glucose, UA: NEGATIVE mg/dL
Ketones, ur: NEGATIVE mg/dL
Leukocytes,Ua: NEGATIVE
Nitrite: NEGATIVE
Protein, ur: NEGATIVE mg/dL
Specific Gravity, Urine: 1.019 (ref 1.005–1.030)
pH: 5 (ref 5.0–8.0)

## 2019-11-23 LAB — COMPREHENSIVE METABOLIC PANEL
ALT: 11 U/L (ref 0–44)
AST: 16 U/L (ref 15–41)
Albumin: 3.8 g/dL (ref 3.5–5.0)
Alkaline Phosphatase: 83 U/L (ref 38–126)
Anion gap: 12 (ref 5–15)
BUN: 21 mg/dL (ref 8–23)
CO2: 24 mmol/L (ref 22–32)
Calcium: 8.7 mg/dL — ABNORMAL LOW (ref 8.9–10.3)
Chloride: 100 mmol/L (ref 98–111)
Creatinine, Ser: 1.01 mg/dL — ABNORMAL HIGH (ref 0.44–1.00)
GFR, Estimated: 53 mL/min — ABNORMAL LOW (ref >60)
Glucose, Bld: 135 mg/dL — ABNORMAL HIGH (ref 70–99)
Potassium: 4 mmol/L (ref 3.5–5.1)
Sodium: 136 mmol/L (ref 135–145)
Total Bilirubin: 0.5 mg/dL (ref 0.3–1.2)
Total Protein: 7.5 g/dL (ref 6.5–8.1)

## 2019-11-23 LAB — LIPASE, BLOOD: Lipase: 37 U/L (ref 11–51)

## 2019-11-23 MED ORDER — IOHEXOL 300 MG/ML  SOLN
100.0000 mL | Freq: Once | INTRAMUSCULAR | Status: AC | PRN
  Administered 2019-11-23: 100 mL via INTRAVENOUS

## 2019-11-23 MED ORDER — FAMOTIDINE IN NACL 20-0.9 MG/50ML-% IV SOLN
20.0000 mg | Freq: Once | INTRAVENOUS | Status: AC
  Administered 2019-11-23: 20 mg via INTRAVENOUS
  Filled 2019-11-23: qty 50

## 2019-11-23 MED ORDER — DEXAMETHASONE SODIUM PHOSPHATE 10 MG/ML IJ SOLN
10.0000 mg | Freq: Once | INTRAMUSCULAR | Status: AC
  Administered 2019-11-23: 10 mg via INTRAVENOUS
  Filled 2019-11-23: qty 1

## 2019-11-23 MED ORDER — ONDANSETRON HCL 4 MG PO TABS: 4.0000 mg | ORAL_TABLET | Freq: Four times a day (QID) | ORAL | 0 refills | Status: DC

## 2019-11-23 MED ORDER — EPINEPHRINE 0.3 MG/0.3ML IJ SOAJ: 0.3000 mg | INTRAMUSCULAR | 0 refills | Status: DC | PRN

## 2019-11-23 MED ORDER — ONDANSETRON HCL 4 MG/2ML IJ SOLN
4.0000 mg | Freq: Once | INTRAMUSCULAR | Status: AC
  Administered 2019-11-23: 4 mg via INTRAVENOUS
  Filled 2019-11-23: qty 2

## 2019-11-23 MED ORDER — SODIUM CHLORIDE 0.9 % IV BOLUS
500.0000 mL | Freq: Once | INTRAVENOUS | Status: AC
  Administered 2019-11-23: 500 mL via INTRAVENOUS

## 2019-11-23 NOTE — ED Provider Notes (Signed)
[facility] EMERGENCY DEPARTMENT Provider Note   CSN: [id number] Arrival date & time: 11/23/19  1219     History Chief Complaint  Patient presents with  . Abdominal Pain    Marisa Bailey is a 79 y.o. female.  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  States that since yesterday she has had some lower abdominal pain.  Located to the bilateral lower abdomen.  Pain is improved since onset.  She is a been experiencing nausea since yesterday as well and has had some vomiting today.  She denies any diarrhea or constipation her last BM was today.  She does report that she has had decreased flatus.  She denies any fevers or urinary symptoms.  She denies any new cough or shortness of breath.  She does report that yesterday she developed a rash and felt like her tongue was swollen however today it is back to normal.  She has continued to have some rash that is pruritic.  She denies any new medications, soaps, detergents or other known environmental changes.  Her family member is at bedside and states that she had spinach yesterday which is not normal for her and she wonders if this could have been the cause of her symptoms.  Past Medical History:  Diagnosis Date  . Bronchitis   . COPD (chronic obstructive pulmonary disease) ([place])   . Hypertension     Patient Active Problem List   Diagnosis Date Noted  . Acute on chronic respiratory failure with hypoxia and hypercapnia ([place]) 05/26/2019  . Acute Respiratory failure with hypoxia ([place]) 01/11/2019  . Acute respiratory failure ([place]) 01/10/2019  . Acute respiratory failure with hypoxia and hypercapnia ([place]) 08/01/2018  . Acute lower UTI 08/01/2018  . Hypoxia   . Leg swelling   . Obesity (BMI 30-39.9) 01/24/2018  . Acute respiratory failure with hypoxia ([place]) 06/27/2017  . COPD (chronic obstructive pulmonary disease) ([place])  06/27/2017  . Acute exacerbation of CHF (congestive heart failure) ([place]) 06/27/2017  . Tobacco abuse 06/27/2017  . Hypertension   . Muscle weakness (generalized) 08/17/2012  . Pain in joint, shoulder region 08/17/2012  . Fx upper humerus-closed 08/17/2012    Past Surgical History:  Procedure Laterality Date  . ENUCLEATION       OB History   No obstetric history on file.     Family History  Problem Relation Age of Onset  . Diabetes Mother     Social History   Tobacco Use  . Smoking status: Current Every Day Smoker    Packs/day: 0.50    Types: Cigarettes  . Smokeless tobacco: Never Used  Vaping Use  . Vaping Use: Never used  Substance Use Topics  . Alcohol use: No    Comment: rarely  . Drug use: No    Home Medications Prior to Admission medications   Medication Sig Start Date End Date Taking? Authorizing Provider  acetaminophen (TYLENOL) 325 MG tablet Take 2 tablets (650 mg total) by mouth every 6 (six) hours as needed for mild pain (or Fever >/= 101). 01/14/19  Yes Emokpae, Courage, MD  albuterol (PROVENTIL) (2.5 MG/3ML) 0.083% nebulizer solution Take 3 mLs (2.5 mg total) by nebulization every 6 (six) hours as needed for wheezing or shortness of breath. 05/28/19  Yes Emokpae, Courage, MD  amLODipine (NORVASC) 10 MG tablet Take 1 tablet (10 mg total) by mouth daily. 05/28/19  Yes Emokpae,  Courage, MD  aspirin EC 81 MG tablet Take 1 tablet (81 mg total) by mouth daily with breakfast. 05/28/19  Yes Emokpae, Courage, MD  atorvastatin (LIPITOR) 20 MG tablet Take 1 tablet (20 mg total) by mouth daily. 05/29/19  Yes Emokpae, Courage, MD  cetirizine (ZYRTEC) 10 MG tablet Take 10 mg by mouth daily.   Yes [provider]  Cholecalciferol (VITAMIN D3) 1.25 MG (50000 UT) TABS Take 1 tablet by mouth daily.   Yes [provider]  folic acid (FOLVITE) 1 MG tablet Take 1 tablet (1 mg total) by mouth daily. 01/14/19  Yes Emokpae, Courage, MD  furosemide (LASIX) 40 MG  tablet Take 1 tablet (40 mg total) by mouth 2 (two) times daily. 05/28/19  Yes Emokpae, Courage, MD  metFORMIN (GLUCOPHAGE) 500 MG tablet Take 500 mg by mouth 2 (two) times daily. 09/07/19  Yes [provider]  mometasone-formoterol (DULERA) 200-5 MCG/ACT AERO Inhale 2 puffs into the lungs 2 (two) times daily. 05/28/19  Yes Emokpae, Courage, MD  olmesartan (BENICAR) 40 MG tablet Take 1 tablet (40 mg total) by mouth daily. 01/14/19  Yes Roxan Hockey, MD  PROAIR HFA 108 (90 Base) MCG/ACT inhaler Inhale 2 puffs into the lungs every 4 (four) hours as needed for wheezing or shortness of breath. 05/28/19  Yes Emokpae, Courage, MD  Zinc 50 MG CAPS Take 1 capsule by mouth daily.   Yes [provider]  EPINEPHrine 0.3 mg/0.3 mL IJ SOAJ injection Inject 0.3 mg into the muscle as needed for anaphylaxis. 11/23/19   Ethelene Closser S, PA-C  guaiFENesin (MUCINEX) 600 MG 12 hr tablet Take 1 tablet (600 mg total) by mouth 2 (two) times daily. Patient not taking: Reported on 11/23/2019 05/28/19   Roxan Hockey, MD  ondansetron (ZOFRAN) 4 MG tablet Take 1 tablet (4 mg total) by mouth every 6 (six) hours. 11/23/19   Celester Morgan S, PA-C  potassium chloride SA (KLOR-CON) 20 MEQ tablet Take 1 tablet (20 mEq total) by mouth daily. Patient not taking: Reported on 11/23/2019 05/29/19   Roxan Hockey, MD  predniSONE (DELTASONE) 20 MG tablet Take 2 tablets (40 mg total) by mouth daily with breakfast. Patient not taking: Reported on 05/28/2019 01/14/19   Roxan Hockey, MD    Allergies    Penicillins  Review of Systems   Review of Systems  Constitutional: Negative for chills and fever.  HENT: Positive for facial swelling (resolved). Negative for ear pain and sore throat.   Eyes: Negative for pain and visual disturbance.  Respiratory: Negative for cough and shortness of breath.   Cardiovascular: Negative for chest pain.  Gastrointestinal: Positive for abdominal pain, diarrhea and nausea.  Negative for constipation and vomiting.  Genitourinary: Negative for dysuria and hematuria.  Musculoskeletal: Negative for back pain.  Skin: Positive for rash. Negative for color change.  Neurological: Negative for headaches.  All other systems reviewed and are negative.   Physical Exam Updated Vital Signs BP 112/81   Pulse 66   Temp 98.4 F (36.9 C) (Oral)   Resp 14   Ht 5' 4.5" (1.638 m)   Wt 78.5 kg   SpO2 100%   BMI 29.24 kg/m   Physical Exam Vitals and nursing note reviewed.  Constitutional:      General: She is not in acute distress.    Appearance: She is well-developed.  HENT:     Head: Normocephalic and atraumatic.     Mouth/Throat:     Comments: No angioedema on exam.  Normal  phonation, tolerating secretions. Eyes:     Conjunctiva/sclera: Conjunctivae normal.  Cardiovascular:     Rate and Rhythm: Normal rate and regular rhythm.     Heart sounds: Normal heart sounds. No murmur heard.   Pulmonary:     Effort: Pulmonary effort is normal. No respiratory distress.     Breath sounds: Wheezing present. No rhonchi or rales.  Abdominal:     General: Bowel sounds are normal.     Palpations: Abdomen is soft.     Tenderness: There is abdominal tenderness in the right lower quadrant. There is no guarding or rebound.  Musculoskeletal:     Cervical back: Neck supple.  Skin:    General: Skin is warm and dry.     Findings: Rash (urticaria to LUE, right lower abdomen) present.  Neurological:     Mental Status: She is alert.     ED Results / Procedures / Treatments   Labs (all labs ordered are listed, but only abnormal results are displayed) Labs Reviewed  COMPREHENSIVE METABOLIC PANEL - Abnormal; Notable for the following components:      Result Value   Glucose, Bld 135 (*)    Creatinine, Ser 1.01 (*)    Calcium 8.7 (*)    GFR, Estimated 53 (*)    All other components within normal limits  CBC - Abnormal; Notable for the following components:   WBC 11.6 (*)     All other components within normal limits  URINALYSIS, ROUTINE W REFLEX MICROSCOPIC - Abnormal; Notable for the following components:   APPearance HAZY (*)    Hgb urine dipstick MODERATE (*)    Bacteria, UA RARE (*)    All other components within normal limits  LIPASE, BLOOD    EKG None  Radiology CT ABDOMEN PELVIS W CONTRAST  Result Date: 11/23/2019 CLINICAL DATA:  79 year old female with history of acute onset of nonlocalized lower abdominal pain and nausea. EXAM: CT ABDOMEN AND PELVIS WITH CONTRAST TECHNIQUE: Multidetector CT imaging of the abdomen and pelvis was performed using the standard protocol following bolus administration of intravenous contrast. CONTRAST:  163m OMNIPAQUE IOHEXOL 300 MG/ML  SOLN COMPARISON:  CT the abdomen and pelvis 08/01/2018. FINDINGS: Lower chest: Cardiomegaly. Atherosclerotic calcifications in the descending thoracic aorta, as well as the left anterior descending, left circumflex and right coronary arteries. Scattered areas of mild cylindrical bronchiectasis noted in the lower lungs bilaterally. Hepatobiliary: Subcentimeter low-attenuation lesion in segment 7 of the liver, too small to characterize, but statistically likely to represent a tiny cyst. No other suspicious hepatic lesions. No intra or extrahepatic biliary ductal dilatation. Gallbladder is normal in appearance. Pancreas: No pancreatic mass. No pancreatic ductal dilatation. No pancreatic or peripancreatic fluid collections or inflammatory changes. Spleen: Unremarkable. Adrenals/Urinary Tract: In the upper pole the right kidney there is a 2 cm low-attenuation lesion compatible with a simple cyst. In the interpolar region of the left kidney there is a 1.6 cm intermediate attenuation (30 HU) lesion, very similar to prior examination, likely to represent a proteinaceous cyst. Other subcentimeter low-attenuation lesions in the kidneys bilaterally, too small to characterize, but statistically likely to  represent tiny cysts. No hydroureteronephrosis. Urinary bladder is normal in appearance. Stomach/Bowel: Normal appearance of the stomach. No pathologic dilatation of small bowel or colon. The appendix is not confidently identified and may be surgically absent. Regardless, there are no inflammatory changes noted adjacent to the cecum to suggest the presence of an acute appendicitis at this time. Vascular/Lymphatic: Aortic atherosclerosis, without evidence of aneurysm  or dissection in the abdominal or pelvic vasculature. No lymphadenopathy noted in the abdomen or pelvis. Reproductive: In the left side of the uterine body there is a 2.6 cm hypovascular lesion, likely to represent a fibroid. Ovaries are unremarkable in appearance. Other: No significant volume of ascites.  No pneumoperitoneum. Musculoskeletal: There are no aggressive appearing lytic or blastic lesions noted in the visualized portions of the skeleton. IMPRESSION: 1. No acute findings are noted in the abdomen or pelvis to account for the patient's symptoms. 2. Probable 2.6 cm fibroid in the left side of the uterine body, similar to the prior study. 3. Aortic atherosclerosis, in addition to at least 3 vessel coronary artery disease. Assessment for potential risk factor modification, dietary therapy or pharmacologic therapy may be warranted, if clinically indicated. 4. Additional incidental findings, similar to prior studies, as above. Electronically Signed   By: Vinnie Langton M.D.   On: 11/23/2019 16:02    Procedures Procedures (including critical care time)  Medications Ordered in ED Medications  famotidine (PEPCID) IVPB 20 mg premix (0 mg Intravenous Stopped 11/23/19 1541)  ondansetron (ZOFRAN) injection 4 mg (4 mg Intravenous Given 11/23/19 1441)  sodium chloride 0.9 % bolus 500 mL (500 mLs Intravenous New Bag/Given 11/23/19 1442)  dexamethasone (DECADRON) injection 10 mg (10 mg Intravenous Given 11/23/19 1441)  iohexol (OMNIPAQUE) 300  MG/ML solution 100 mL (100 mLs Intravenous Contrast Given 11/23/19 1459)    ED Course  I have reviewed the triage vital signs and the nursing notes.  Pertinent labs & imaging results that were available during my care of the patient were reviewed by me and considered in my medical decision making (see chart for details).    MDM Rules/Calculators/A&P                          79 year old female presenting to the emergency department today for evaluation of abdominal pain starting yesterday.  Also reporting a rash that started yesterday.  Had some facial swelling yesterday that is completely resolved.  Her only known environmental changes that she had spinach yesterday which is not typical for her.  Her presentation today is not consistent with anaphylaxis as she does have some hives to the bilateral upper extremities.  Will order labs, CT abdomen/pelvis.  We will also give Decadron and Pepcid to help with her rash.  We will also give Zofran for her nausea.  Reviewed/interpreted labs CBC with mild leukocytosis, no anemia CMP with normal electrolytes, slightly elevated renal function, normal LFTs and bilirubin. Lipase normal UA with some hematuria, otherwise reassuring and no signs of infection  Reviewed/interpreted imaging CT abdomen/pelvis -  1. No acute findings are noted in the abdomen or pelvis to account for the patient's symptoms. 2. Probable 2.6 cm fibroid in the left side of the uterine body, similar to the prior study. 3. Aortic atherosclerosis, in addition to at least 3 vessel coronary artery disease. Assessment for potential risk factor modification, dietary therapy or pharmacologic therapy may be warranted, if clinically indicated.   - discussed no acute findings but made aware of chronic findings and of need for f/u  Patient's work-up is reassuring here today.  She is feeling improved after medications.  Her rash is resolved.  She continues to have no angioedema throughout her  multiple hour ED observation..  Reviewed med list and she is on benicar, will advise her to hold this medication and f/u with pcp to discuss permament discontinuation due to  concern for possible angioedema. Given pts report that her symptoms have resolved and clinically she does not appear to have any severe angioedema I feel she can be discharged with close monitoring. With respect to the rash I suspect she may have had an allergic reaction.  Will DC with an EpiPen as precaution given she had some facial swelling yesterday.  With regard to her abdominal complaints, suspect viral illness.  Will provide Rx for supportive treatment for this. Have advised close f/u with pcp and strict return precautions. She voices understanding and is in agreement with the plan. All questions answered, pt stable for discharge.  Pt seen in conjunction with Dr. Eulis Foster who personally evaluated the patient and is in agreement with the plan.    Final Clinical Impression(s) / ED Diagnoses Final diagnoses:  Abdominal pain, unspecified abdominal location  Facial swelling  Rash    Rx / DC Orders ED Discharge Orders         Ordered    EPINEPHrine 0.3 mg/0.3 mL IJ SOAJ injection  As needed        11/23/19 1718    ondansetron (ZOFRAN) 4 MG tablet  Every 6 hours        11/23/19 1718           Ammie Warrick S, PA-C 11/23/19 1719    Daleen Bo, MD 11/24/19 0011

## 2019-11-23 NOTE — ED Triage Notes (Signed)
Pt with abd pain since last night, states she had tongue swelling last night but her tongue is back to normal, itching to bilateral hands off and on.  Nausea earlier but denies emesis.

## 2019-11-23 NOTE — Discharge Instructions (Addendum)
You should stop taking your Benicar (olmesartan) as we are concerned that this could have cause you to have swelling in your face yesterday.   Additionally, you were given a prescription for an EpiPen. If you notice any recurrence of facial swelling/tongue swelling you should use this medication and report to the emergency department immediately.   For your abdominal pain and nausea, I have given a prescription for Zofran. Please take as directed.   Please follow up with your primary care provider within 5-7 days for re-evaluation of your symptoms. If you do not have a primary care provider, information for a healthcare clinic has been provided for you to make arrangements for follow up care. Please return to the emergency department for any new or worsening symptoms.

## 2019-11-23 NOTE — ED Provider Notes (Signed)
  Face-to-face evaluation   History: She presents for evaluation of swelling of her tongue which started last night after eating spinach.  She also complains of abdominal distention and some vomiting.  She was able to drive herself here for evaluation.  She is currently in the ED with her niece.  Physical exam: Obese, alert female who is comfortable.  Abdomen soft and nontender to palpation, he does appear mildly distended.  Tongue is mildly swollen but the airway is intact.  Medical screening examination/treatment/procedure(s) were conducted as a shared visit with non-physician practitioner(s) and myself.  I personally evaluated the patient during the encounter    Daleen Bo, MD 11/24/19 [phone]

## 2020-08-08 ENCOUNTER — Other Ambulatory Visit (INDEPENDENT_AMBULATORY_CARE_PROVIDER_SITE_OTHER): Payer: Self-pay | Admitting: Internal Medicine

## 2020-08-12 IMAGING — CT CT ABDOMEN AND PELVIS WITH CONTRAST
2 of 5 series · 16 of 46 positions shown, 18 images · IV contrast (Isovue)
Comparison: Chest CT 01/24/2018

CLINICAL DATA: Abdominal pain and feet swelling 2-3 days.  Hypoxia.

EXAM:
CT ABDOMEN AND PELVIS WITH CONTRAST
TECHNIQUE: Multidetector CT imaging of the abdomen and pelvis was performed
using the standard protocol following bolus administration of
intravenous contrast.
CONTRAST:  100mL OMNIPAQUE IOHEXOL 300 MG/ML  SOLN

[Series 2: axial st · axial · 0.78mm/px · z∈[+843,+1203]mm · 13 of 84 slices shown, 15 images]
[im 6/84  soft-tissue]
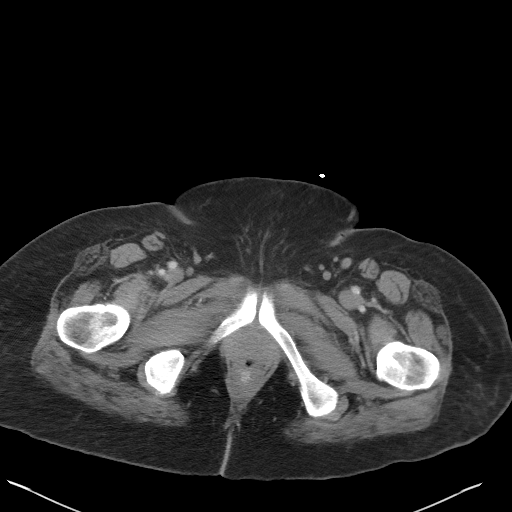
[im 6/84  bone]
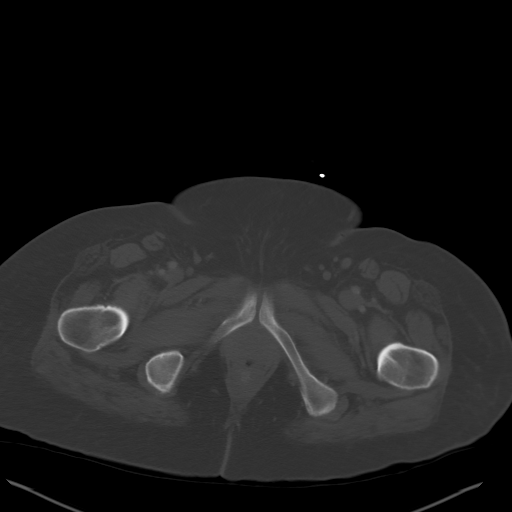
[im 12/84  soft-tissue]
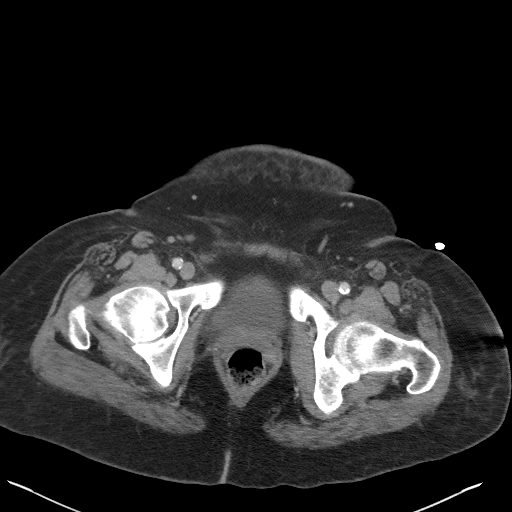
[im 17/84  soft-tissue]
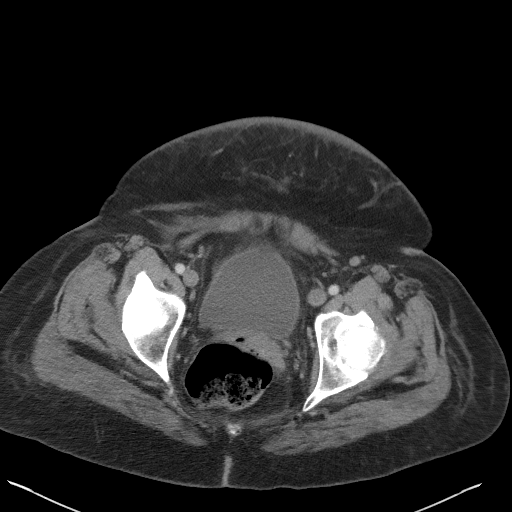
[im 23/84  soft-tissue]
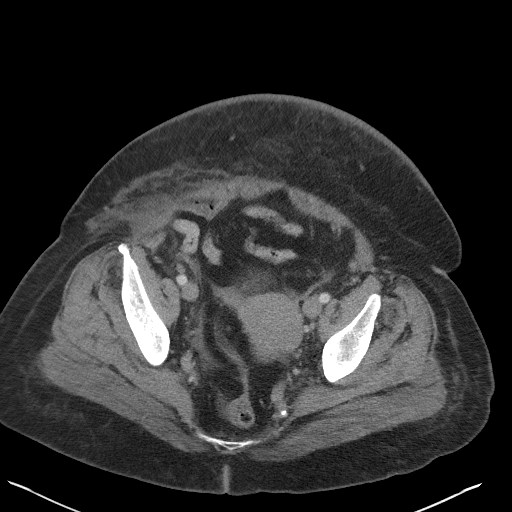
[im 28/84  soft-tissue]
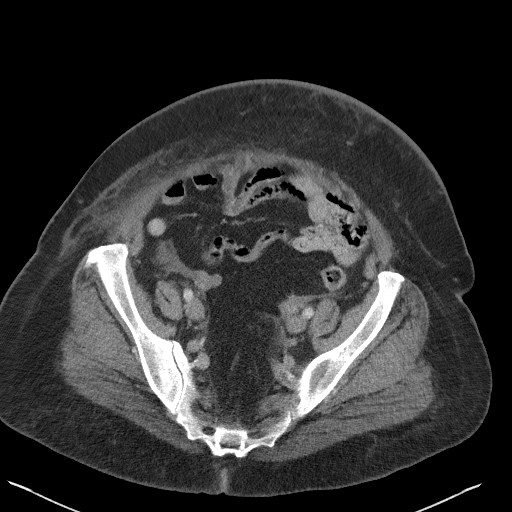
[im 34/84  soft-tissue]
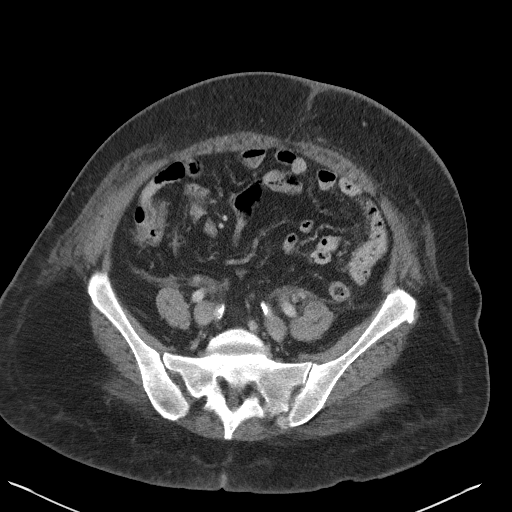
[im 45/84  soft-tissue]
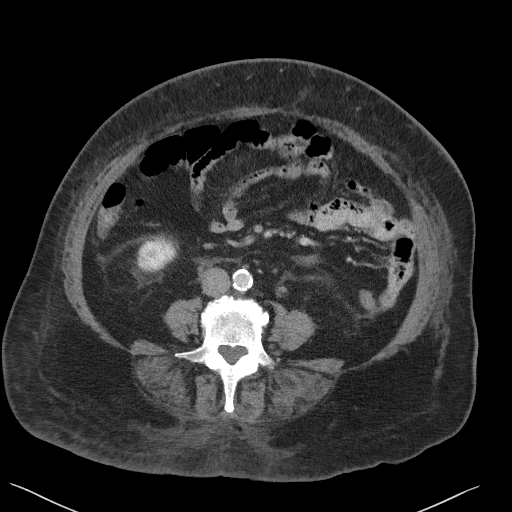
[im 50/84  soft-tissue]
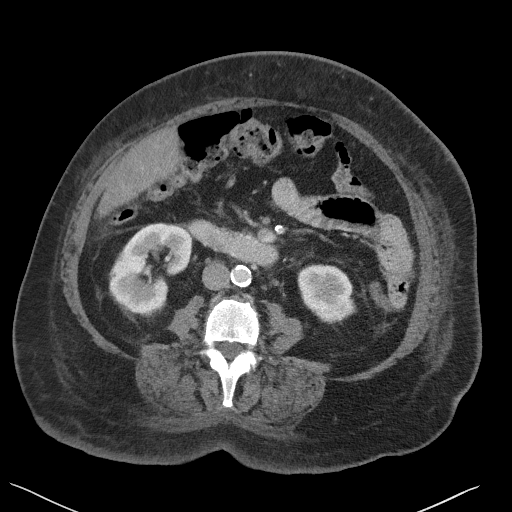
[im 56/84  soft-tissue]
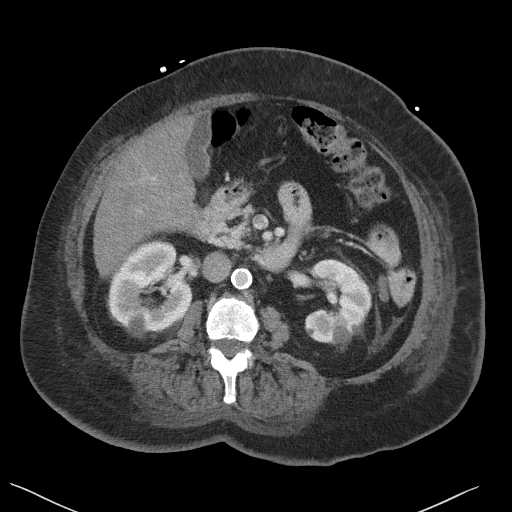
[im 56/84  bone]
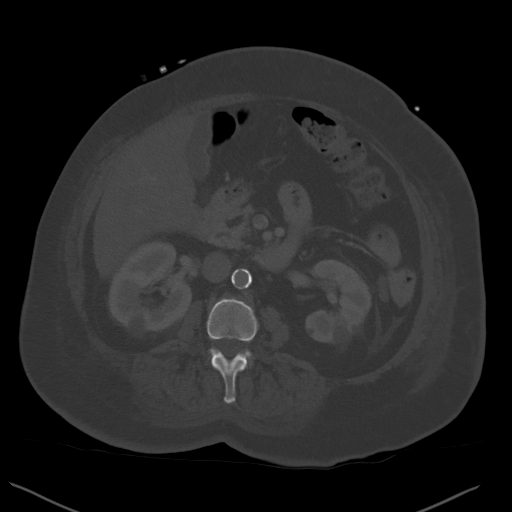
[im 61/84  soft-tissue]
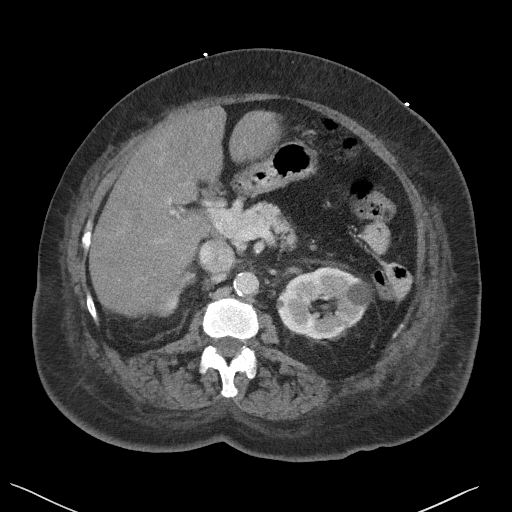
[im 67/84  soft-tissue]
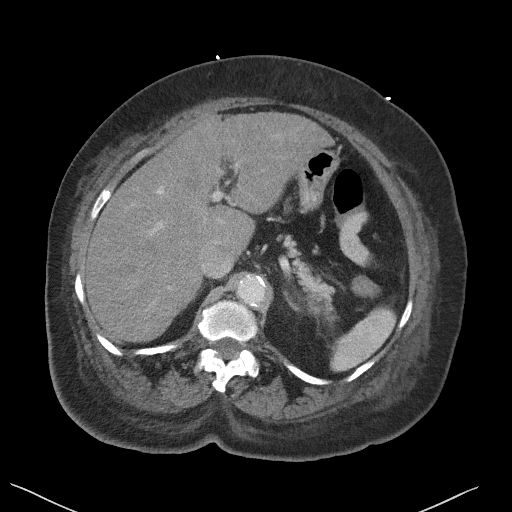
[im 72/84  soft-tissue]
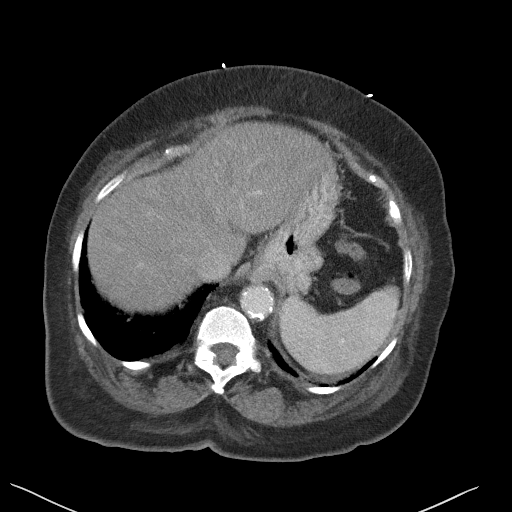
[im 78/84  soft-tissue]
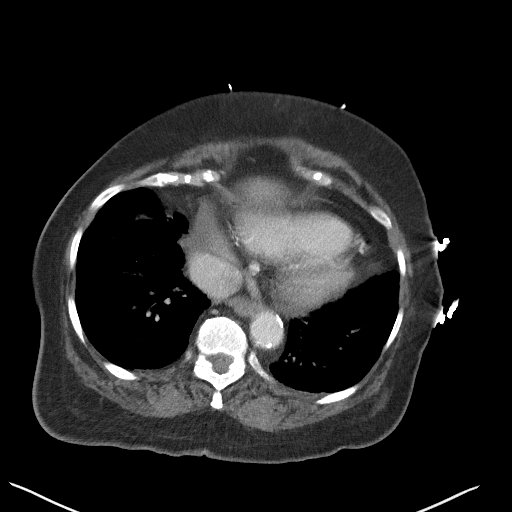

[Series 5: coronal st · coronal · 0.73mm/px · 3 of 108 slices shown]
[im 36/108  soft-tissue]
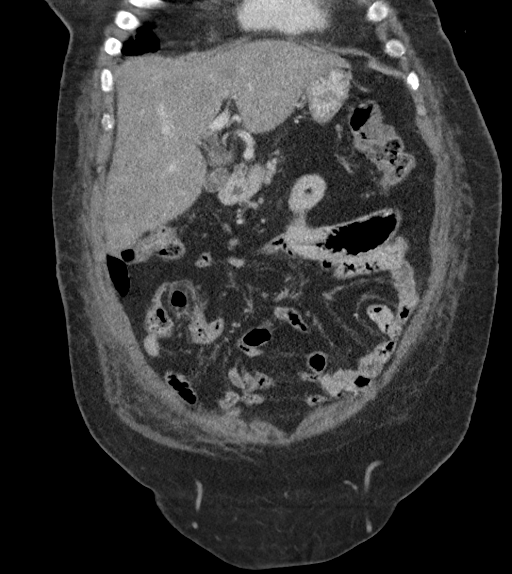
[im 48/108  soft-tissue]
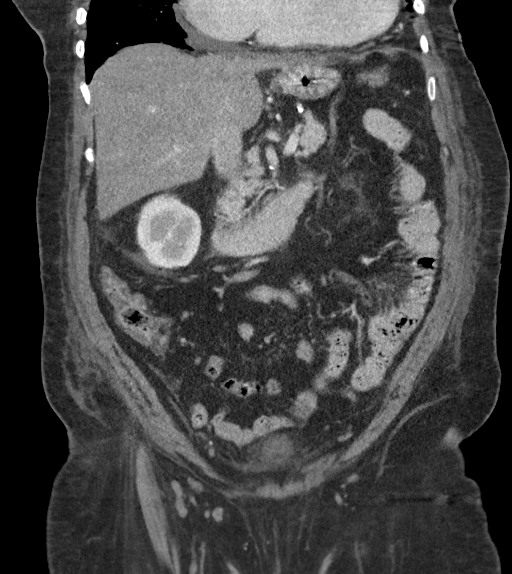
[im 60/108  soft-tissue]
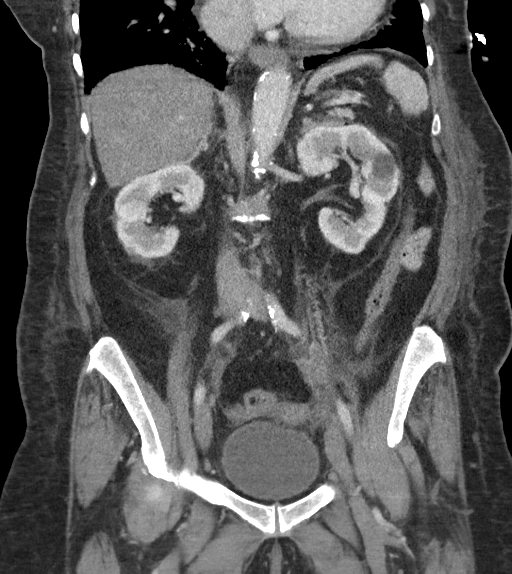

[16 of 46 positions shown; findings below may reference images not displayed]

FINDINGS: Lower chest: Moderate stable cardiomegaly. Calcified plaque over the
right coronary artery. Mild interstitial changes and bronchiectasis
over the lung bases which is stable. Calcified plaque over the
descending thoracic aorta.

Hepatobiliary: Mild low-attenuation of the liver without focal mass.
Gallbladder and biliary tree are normal.

Pancreas: Normal.

Spleen: Normal.

Adrenals/Urinary Tract: Adrenal glands are normal and symmetric.
Kidneys are normal in size with several small cysts. No
hydronephrosis or nephrolithiasis. Ureters and bladder are normal.

Stomach/Bowel: Stomach and small bowel are normal. Appendix is
normal. Colon is unremarkable.

Vascular/Lymphatic: Mild-to-moderate calcified plaque over the
abdominal aorta. No adenopathy.

Reproductive: Normal.

Other: Minimal free peritoneal fluid. No free peritoneal air. No
focal inflammatory change. Mild subcutaneous edema over the
abdomen/pelvis.

Musculoskeletal: Degenerative change of the spine and hips.
IMPRESSION: No acute findings in the abdomen/pelvis.

Minimal nonspecific free peritoneal fluid.

Mild stable cardiomegaly. Stable interstitial changes and
bronchiectasis in the lung bases.

Mild hepatic steatosis without focal mass.

Bilateral renal cysts.

Aortic Atherosclerosis ([id number]).

## 2020-09-07 ENCOUNTER — Other Ambulatory Visit: Payer: Self-pay

## 2020-09-07 ENCOUNTER — Ambulatory Visit (INDEPENDENT_AMBULATORY_CARE_PROVIDER_SITE_OTHER): Payer: Medicare Other | Admitting: Nurse Practitioner

## 2020-09-07 ENCOUNTER — Encounter (INDEPENDENT_AMBULATORY_CARE_PROVIDER_SITE_OTHER): Payer: Self-pay | Admitting: Nurse Practitioner

## 2020-09-07 VITALS — BP 133/69 | HR 65 | Temp 97.9°F | Resp 18 | Ht 64.0 in | Wt 168.0 lb

## 2020-09-07 DIAGNOSIS — I10 Essential (primary) hypertension: Secondary | ICD-10-CM

## 2020-09-07 DIAGNOSIS — E785 Hyperlipidemia, unspecified: Secondary | ICD-10-CM | POA: Diagnosis not present

## 2020-09-07 DIAGNOSIS — E119 Type 2 diabetes mellitus without complications: Secondary | ICD-10-CM | POA: Diagnosis not present

## 2020-09-07 DIAGNOSIS — E559 Vitamin D deficiency, unspecified: Secondary | ICD-10-CM

## 2020-09-07 DIAGNOSIS — J449 Chronic obstructive pulmonary disease, unspecified: Secondary | ICD-10-CM

## 2020-09-07 NOTE — Progress Notes (Signed)
Subjective:  Patient ID: Marisa Bailey, female    DOB: 1940-05-19  Age: 80 y.o. MRN: [id number]  CC:  Chief Complaint  Patient presents with   Establish Care       HPI  This patient arrives today for the above.  Her previous primary care provider has retired so she is looking to establish care here.  She has a past medical history as listed below.  I do see on her medication list she is on vitamin D as well as metformin.  She tells me she believes she was told she has a slight case of diabetes, and it appears she may have a history of vitamin D deficiency.  She has no complaints today.  She tells me she has no chest pain, no shortness of breath, no stomach pain, has not noticed any blood in her stool, etc.  Past Medical History:  Diagnosis Date   Blind right eye    Bronchitis    CHF (congestive heart failure) (HCC)    COPD (chronic obstructive pulmonary disease) (HCC)    Hypertension       Family History  Problem Relation Age of Onset   Diabetes Mother     Social History   Social History Narrative   Retired (from [employer]). No children, never been married. Has 8 siblings (5 are deceased).   Social History   Tobacco Use   Smoking status: Every Day    Packs/day: 1.00    Years: 18.00    Pack years: 18.00    Types: Cigarettes   Smokeless tobacco: Never  Substance Use Topics   Alcohol use: No    Comment: rarely     Current Meds  Medication Sig   acetaminophen (TYLENOL) 325 MG tablet Take 2 tablets (650 mg total) by mouth every 6 (six) hours as needed for mild pain (or Fever >/= 101).   albuterol (PROVENTIL) (2.5 MG/3ML) 0.083% nebulizer solution Take 3 mLs (2.5 mg total) by nebulization every 6 (six) hours as needed for wheezing or shortness of breath.   amLODipine (NORVASC) 10 MG tablet Take 1 tablet (10 mg total) by mouth daily.   aspirin EC 81 MG tablet Take 1 tablet (81 mg total) by mouth daily with breakfast.   atorvastatin (LIPITOR) 20 MG tablet Take 1  tablet (20 mg total) by mouth daily.   cetirizine (ZYRTEC) 10 MG tablet Take 10 mg by mouth daily.   Cholecalciferol (VITAMIN D3) 1.25 MG (50000 UT) TABS Take 1 tablet by mouth daily.   EPINEPHrine 0.3 mg/0.3 mL IJ SOAJ injection Inject 0.3 mg into the muscle as needed for anaphylaxis.   folic acid (FOLVITE) 1 MG tablet Take 1 tablet (1 mg total) by mouth daily.   furosemide (LASIX) 40 MG tablet Take 1 tablet (40 mg total) by mouth 2 (two) times daily.   meloxicam (MOBIC) 15 MG tablet Take 15 mg by mouth daily as needed.   metFORMIN (GLUCOPHAGE) 500 MG tablet Take 500 mg by mouth 2 (two) times daily.   mometasone-formoterol (DULERA) 200-5 MCG/ACT AERO Inhale 2 puffs into the lungs 2 (two) times daily.   olmesartan (BENICAR) 40 MG tablet Take 1 tablet (40 mg total) by mouth daily.   ondansetron (ZOFRAN) 4 MG tablet Take 1 tablet (4 mg total) by mouth every 6 (six) hours.   potassium chloride SA (KLOR-CON) 20 MEQ tablet Take 1 tablet (20 mEq total) by mouth daily.   PROAIR HFA 108 (90 Base) MCG/ACT inhaler Inhale  2 puffs into the lungs every 4 (four) hours as needed for wheezing or shortness of breath.   Zinc 50 MG CAPS Take 1 capsule by mouth daily.    ROS:  Review of Systems  Respiratory:  Negative for shortness of breath.   Cardiovascular:  Negative for chest pain.  Gastrointestinal:  Negative for abdominal pain, blood in stool and melena.  Neurological:  Negative for dizziness and headaches.    Objective:   Today's Vitals: BP 133/69 (BP Location: Right Arm, Patient Position: Sitting, Cuff Size: Normal)   Pulse 65   Temp 97.9 F (36.6 C) (Temporal)   Resp 18   Ht _0  (1.626 m)   Wt 168 lb (76.2 kg)   SpO2 98%   BMI 28.84 kg/m  Vitals with BMI 09/07/2020 11/23/2019 11/23/2019  Height _1  - -  Weight 168 lbs - -  BMI 85.88 - -  Systolic 502 774 -  Diastolic 69 71 -  Pulse 65 73 82     Physical Exam Vitals reviewed.  Constitutional:      General: She is not in acute  distress.    Appearance: Normal appearance.  HENT:     Head: Normocephalic and atraumatic.  Neck:     Vascular: No carotid bruit.  Cardiovascular:     Rate and Rhythm: Normal rate and regular rhythm.     Pulses: Normal pulses.     Heart sounds: Normal heart sounds.  Pulmonary:     Effort: Pulmonary effort is normal.     Breath sounds: Normal breath sounds.  Skin:    General: Skin is warm and dry.  Neurological:     General: No focal deficit present.     Mental Status: She is alert and oriented to person, place, and time.  Psychiatric:        Mood and Affect: Mood normal.        Behavior: Behavior normal.        Judgment: Judgment normal.         Assessment and Plan   1. Hypertension, unspecified type   2. Chronic obstructive pulmonary disease, unspecified COPD type ([place])   3. Hyperlipidemia, unspecified hyperlipidemia type   4. Type 2 diabetes mellitus without complication, without long-term current use of insulin ([place])   5. Vitamin D deficiency      Plan: 1.  Blood pressure acceptable on current regimen.  She will continue on medications as currently prescribed. 2.  Lung sounds are clear.  She is still an active smoker and encouraged her to consider quitting.  She will continue taking her inhalers as currently prescribed. 3.  We will check lipid panel as well as other blood work today. 4.  We will check A1c as well as other blood work today. 5.  We will check a vitamin D level as well as other blood work today.   Tests ordered Orders Placed This Encounter  Procedures   CBC   COMPLETE METABOLIC PANEL WITH GFR   Lipid panel   Hemoglobin A1c   TSH   VITAMIN D 25 Hydroxy (Vit-D Deficiency, Fractures)       No orders of the defined types were placed in this encounter.   Patient to follow-up in 1 month or sooner as needed.  Ailene Ards, NP

## 2020-09-08 ENCOUNTER — Other Ambulatory Visit (INDEPENDENT_AMBULATORY_CARE_PROVIDER_SITE_OTHER): Payer: Self-pay | Admitting: Nurse Practitioner

## 2020-09-08 DIAGNOSIS — R748 Abnormal levels of other serum enzymes: Secondary | ICD-10-CM

## 2020-09-08 LAB — COMPLETE METABOLIC PANEL WITH GFR
AG Ratio: 1.3 (calc) (ref 1.0–2.5)
ALT: 37 U/L — ABNORMAL HIGH (ref 6–29)
AST: 34 U/L (ref 10–35)
Albumin: 4.2 g/dL (ref 3.6–5.1)
Alkaline phosphatase (APISO): 234 U/L — ABNORMAL HIGH (ref 37–153)
BUN: 18 mg/dL (ref 7–25)
CO2: 29 mmol/L (ref 20–32)
Calcium: 9.5 mg/dL (ref 8.6–10.4)
Chloride: 100 mmol/L (ref 98–110)
Creat: 0.86 mg/dL (ref 0.60–1.00)
Globulin: 3.2 g/dL (calc) (ref 1.9–3.7)
Glucose, Bld: 84 mg/dL (ref 65–139)
Potassium: 5.1 mmol/L (ref 3.5–5.3)
Sodium: 139 mmol/L (ref 135–146)
Total Bilirubin: 0.4 mg/dL (ref 0.2–1.2)
Total Protein: 7.4 g/dL (ref 6.1–8.1)
eGFR: 69 mL/min/{1.73_m2} (ref >=60)

## 2020-09-08 LAB — LIPID PANEL
Cholesterol: 126 mg/dL (ref <200)
HDL: 46 mg/dL — ABNORMAL LOW (ref >=50)
LDL Cholesterol (Calc): 58 mg/dL (calc)
Non-HDL Cholesterol (Calc): 80 mg/dL (calc) (ref <130)
Total CHOL/HDL Ratio: 2.7 (calc) (ref <5.0)
Triglycerides: 138 mg/dL (ref <150)

## 2020-09-08 LAB — TSH: TSH: 1.3 mIU/L (ref 0.40–4.50)

## 2020-09-08 LAB — CBC
HCT: 38.2 % (ref 35.0–45.0)
Hemoglobin: 11.6 g/dL — ABNORMAL LOW (ref 11.7–15.5)
MCH: 26.8 pg — ABNORMAL LOW (ref 27.0–33.0)
MCHC: 30.4 g/dL — ABNORMAL LOW (ref 32.0–36.0)
MCV: 88.2 fL (ref 80.0–100.0)
MPV: 11.5 fL (ref 7.5–12.5)
Platelets: 276 10*3/uL (ref 140–400)
RBC: 4.33 10*6/uL (ref 3.80–5.10)
RDW: 13.1 % (ref 11.0–15.0)
WBC: 6.5 10*3/uL (ref 3.8–10.8)

## 2020-09-08 LAB — HEMOGLOBIN A1C
Hgb A1c MFr Bld: 5.5 % of total Hgb (ref <5.7)
Mean Plasma Glucose: 111 mg/dL
eAG (mmol/L): 6.2 mmol/L

## 2020-09-08 LAB — VITAMIN D 25 HYDROXY (VIT D DEFICIENCY, FRACTURES): Vit D, 25-Hydroxy: 42 ng/mL (ref 30–100)

## 2020-09-08 NOTE — Progress Notes (Signed)
Order for ultrasound of liver for further evaluation of elevated liver enzymes. Please work on getting is approved and scheduled. Thank you.

## 2020-09-12 ENCOUNTER — Other Ambulatory Visit (INDEPENDENT_AMBULATORY_CARE_PROVIDER_SITE_OTHER): Payer: Self-pay | Admitting: Nurse Practitioner

## 2020-09-12 DIAGNOSIS — R748 Abnormal levels of other serum enzymes: Secondary | ICD-10-CM

## 2020-09-12 NOTE — Progress Notes (Signed)
Order for abdominal ultrasound. See if we can get this scheduled ASAP so we can address any issues before office closes. Thank you.

## 2020-09-14 ENCOUNTER — Encounter (INDEPENDENT_AMBULATORY_CARE_PROVIDER_SITE_OTHER): Payer: Self-pay

## 2020-09-14 NOTE — Addendum Note (Signed)
Addended by: Jeralyn Ruths E on: 09/14/2020 11:50 AM   Modules accepted: Orders

## 2020-09-18 ENCOUNTER — Other Ambulatory Visit: Payer: Self-pay

## 2020-09-18 ENCOUNTER — Ambulatory Visit (HOSPITAL_COMMUNITY)
Admission: RE | Admit: 2020-09-18 | Discharge: 2020-09-18 | Disposition: A | Discharge status: Home / Self Care | Payer: Medicare Other | Source: Ambulatory Visit | Attending: Nurse Practitioner | Admitting: Nurse Practitioner

## 2020-09-18 DIAGNOSIS — R748 Abnormal levels of other serum enzymes: Secondary | ICD-10-CM | POA: Diagnosis not present

## 2020-10-02 ENCOUNTER — Telehealth (INDEPENDENT_AMBULATORY_CARE_PROVIDER_SITE_OTHER): Payer: Self-pay

## 2020-10-02 DIAGNOSIS — M25511 Pain in right shoulder: Secondary | ICD-10-CM

## 2020-10-02 DIAGNOSIS — E119 Type 2 diabetes mellitus without complications: Secondary | ICD-10-CM

## 2020-10-02 DIAGNOSIS — I10 Essential (primary) hypertension: Secondary | ICD-10-CM

## 2020-10-02 DIAGNOSIS — E785 Hyperlipidemia, unspecified: Secondary | ICD-10-CM

## 2020-10-02 MED ORDER — ATORVASTATIN CALCIUM 20 MG PO TABS: 20.0000 mg | ORAL_TABLET | Freq: Every day | ORAL | 0 refills | Status: DC

## 2020-10-02 MED ORDER — FOLIC ACID 1 MG PO TABS
1.0000 mg | ORAL_TABLET | Freq: Every day | ORAL | 0 refills | Status: DC
Start: 2020-10-02 — End: 2021-06-14

## 2020-10-02 MED ORDER — AMLODIPINE BESYLATE 10 MG PO TABS: 10.0000 mg | ORAL_TABLET | Freq: Every day | ORAL | 0 refills | Status: DC

## 2020-10-02 MED ORDER — METFORMIN HCL 500 MG PO TABS: 500.0000 mg | ORAL_TABLET | Freq: Two times a day (BID) | ORAL | 0 refills | Status: DC

## 2020-10-02 MED ORDER — MELOXICAM 15 MG PO TABS: 15.0000 mg | ORAL_TABLET | Freq: Every day | ORAL | 2 refills | Status: DC | PRN

## 2020-10-02 MED ORDER — OLMESARTAN MEDOXOMIL 40 MG PO TABS: 40.0000 mg | ORAL_TABLET | Freq: Every day | ORAL | 0 refills | Status: DC

## 2020-10-02 NOTE — Telephone Encounter (Signed)
Refills sent to [facility]

## 2020-10-02 NOTE — Telephone Encounter (Signed)
Called patient to give her lab results and she stated that she has a new doctor appointment at the end of September and will run out of medication and is asking for refills on the following:  metFORMIN (GLUCOPHAGE) 500 MG tablet   amLODipine (NORVASC) 10 MG tablet   olmesartan (BENICAR) 40 MG tablet   atorvastatin (LIPITOR) 20 MG tablet   meloxicam (MOBIC) 15 MG tablet   folic acid (FOLVITE) 1 MG tablet

## 2020-10-11 ENCOUNTER — Ambulatory Visit (INDEPENDENT_AMBULATORY_CARE_PROVIDER_SITE_OTHER): Payer: Medicare Other | Admitting: Internal Medicine

## 2020-10-11 ENCOUNTER — Encounter (INDEPENDENT_AMBULATORY_CARE_PROVIDER_SITE_OTHER): Payer: Self-pay

## 2020-11-02 ENCOUNTER — Emergency Department (HOSPITAL_COMMUNITY): Payer: Medicare Other

## 2020-11-02 ENCOUNTER — Observation Stay (HOSPITAL_COMMUNITY)
Admission: EM | Admit: 2020-11-02 | Discharge: 2020-11-04 | Disposition: A | Discharge status: Home / Self Care | Payer: Medicare Other | Attending: Internal Medicine | Admitting: Internal Medicine

## 2020-11-02 ENCOUNTER — Encounter (HOSPITAL_COMMUNITY): Payer: Self-pay | Admitting: *Deleted

## 2020-11-02 ENCOUNTER — Other Ambulatory Visit: Payer: Self-pay

## 2020-11-02 DIAGNOSIS — Z7982 Long term (current) use of aspirin: Secondary | ICD-10-CM | POA: Insufficient documentation

## 2020-11-02 DIAGNOSIS — Z79899 Other long term (current) drug therapy: Secondary | ICD-10-CM | POA: Diagnosis not present

## 2020-11-02 DIAGNOSIS — R0902 Hypoxemia: Secondary | ICD-10-CM

## 2020-11-02 DIAGNOSIS — R042 Hemoptysis: Secondary | ICD-10-CM | POA: Diagnosis not present

## 2020-11-02 DIAGNOSIS — J189 Pneumonia, unspecified organism: Secondary | ICD-10-CM

## 2020-11-02 DIAGNOSIS — J690 Pneumonitis due to inhalation of food and vomit: Secondary | ICD-10-CM

## 2020-11-02 DIAGNOSIS — Z7984 Long term (current) use of oral hypoglycemic drugs: Secondary | ICD-10-CM | POA: Insufficient documentation

## 2020-11-02 DIAGNOSIS — I10 Essential (primary) hypertension: Secondary | ICD-10-CM

## 2020-11-02 DIAGNOSIS — I509 Heart failure, unspecified: Secondary | ICD-10-CM | POA: Diagnosis not present

## 2020-11-02 DIAGNOSIS — Z20822 Contact with and (suspected) exposure to covid-19: Secondary | ICD-10-CM | POA: Diagnosis not present

## 2020-11-02 DIAGNOSIS — F17210 Nicotine dependence, cigarettes, uncomplicated: Secondary | ICD-10-CM | POA: Insufficient documentation

## 2020-11-02 DIAGNOSIS — I5030 Unspecified diastolic (congestive) heart failure: Secondary | ICD-10-CM

## 2020-11-02 DIAGNOSIS — I110 Hypertensive heart disease with heart failure: Secondary | ICD-10-CM | POA: Diagnosis not present

## 2020-11-02 DIAGNOSIS — J449 Chronic obstructive pulmonary disease, unspecified: Secondary | ICD-10-CM | POA: Diagnosis present

## 2020-11-02 DIAGNOSIS — J9611 Chronic respiratory failure with hypoxia: Secondary | ICD-10-CM

## 2020-11-02 DIAGNOSIS — N179 Acute kidney failure, unspecified: Secondary | ICD-10-CM

## 2020-11-02 DIAGNOSIS — Z720 Tobacco use: Secondary | ICD-10-CM | POA: Diagnosis present

## 2020-11-02 LAB — BASIC METABOLIC PANEL
Anion gap: 8 (ref 5–15)
BUN: 16 mg/dL (ref 8–23)
CO2: 27 mmol/L (ref 22–32)
Calcium: 8.9 mg/dL (ref 8.9–10.3)
Chloride: 103 mmol/L (ref 98–111)
Creatinine, Ser: 1.51 mg/dL — ABNORMAL HIGH (ref 0.44–1.00)
GFR, Estimated: 35 mL/min — ABNORMAL LOW (ref >60)
Glucose, Bld: 117 mg/dL — ABNORMAL HIGH (ref 70–99)
Potassium: 4.8 mmol/L (ref 3.5–5.1)
Sodium: 138 mmol/L (ref 135–145)

## 2020-11-02 LAB — CBC WITH DIFFERENTIAL/PLATELET
Abs Immature Granulocytes: 0.02 10*3/uL (ref 0.00–0.07)
Basophils Absolute: 0.1 10*3/uL (ref 0.0–0.1)
Basophils Relative: 1 %
Eosinophils Absolute: 0.9 10*3/uL — ABNORMAL HIGH (ref 0.0–0.5)
Eosinophils Relative: 14 %
HCT: 40.1 % (ref 36.0–46.0)
Hemoglobin: 12.2 g/dL (ref 12.0–15.0)
Immature Granulocytes: 0 %
Lymphocytes Relative: 19 %
Lymphs Abs: 1.2 10*3/uL (ref 0.7–4.0)
MCH: 28.7 pg (ref 26.0–34.0)
MCHC: 30.4 g/dL (ref 30.0–36.0)
MCV: 94.4 fL (ref 80.0–100.0)
Monocytes Absolute: 0.4 10*3/uL (ref 0.1–1.0)
Monocytes Relative: 6 %
Neutro Abs: 3.8 10*3/uL (ref 1.7–7.7)
Neutrophils Relative %: 60 %
Platelets: 264 10*3/uL (ref 150–400)
RBC: 4.25 MIL/uL (ref 3.87–5.11)
RDW: 14.6 % (ref 11.5–15.5)
WBC: 6.3 10*3/uL (ref 4.0–10.5)
nRBC: 0 % (ref 0.0–0.2)

## 2020-11-02 LAB — RESP PANEL BY RT-PCR (FLU A&B, COVID) ARPGX2
Influenza A by PCR: NEGATIVE
Influenza B by PCR: NEGATIVE
SARS Coronavirus 2 by RT PCR: NEGATIVE

## 2020-11-02 MED ORDER — LEVOFLOXACIN IN D5W 750 MG/150ML IV SOLN
750.0000 mg | Freq: Once | INTRAVENOUS | Status: AC
  Administered 2020-11-02: 750 mg via INTRAVENOUS
  Filled 2020-11-02: qty 150

## 2020-11-02 MED ORDER — IOHEXOL 350 MG/ML SOLN
80.0000 mL | Freq: Once | INTRAVENOUS | Status: AC | PRN
  Administered 2020-11-02: 80 mL via INTRAVENOUS

## 2020-11-02 NOTE — ED Provider Notes (Signed)
Emergency Medicine Provider Triage Evaluation Note  Marisa Bailey , a 80 y.o. female  was evaluated in triage.  Pt complains of hemoptysis occurring just prior to arrival.  She denies fevers, chills, chest pain and shortness of breath.  Denies any symptoms prior to this event and only complaint at this time is hunger.  Review of Systems  Positive: hemoptysis Negative: Fever, sob, chest pain. N/v  Physical Exam  BP (!) 156/70 (BP Location: Right Arm)   Pulse 77   Temp 98 F (36.7 C) (Oral)   Resp 20   Ht _0  (1.626 m)   Wt 70.8 kg   SpO2 (!) 85% Comment: triage nurse notified / patient denies SOB  BMI 26.79 kg/m  Gen:   Awake, no distress   Resp:  Normal effort, rhonchi bilateral bases MSK:   Moves extremities without difficulty  Other:    Medical Decision Making  Medically screening exam initiated at 4:41 PM.  Appropriate orders placed.  Marisa Bailey was informed that the remainder of the evaluation will be completed by another provider, this initial triage assessment does not replace that evaluation, and the importance of remaining in the ED until their evaluation is complete.  Pt with h/o COPD, chronic hypoxia, hemoptysis prior to arrival.  Labs, imaging ordered.     Evalee Jefferson, PA-C 11/02/20 1644    Noemi Chapel, MD 11/07/20 [phone]

## 2020-11-02 NOTE — ED Provider Notes (Signed)
Medical screening examination/treatment/procedure(s) were conducted as a shared visit with non-physician practitioner(s) and myself.  I personally evaluated the patient during the encounter.  Clinical Impression:   Final diagnoses:  Community acquired pneumonia, unspecified laterality  Hemoptysis  Hypoxia   This patient is well-appearing but despite this has been having significant amount of hemoptysis today with frequent coughing and is found to have multifocal pneumonia on the chest x-ray and CT scan.  On exam the patient has oxygenation of 93 to 94%, mild tachypnea but is otherwise well-appearing and speaking in full sentences.  Reviewed CT imaging, patient likely would benefit from admission given the amount of hemoptysis that she has had.      Noemi Chapel, MD 11/07/20 [phone]

## 2020-11-02 NOTE — ED Notes (Signed)
Patient transported to CT 

## 2020-11-02 NOTE — ED Triage Notes (Signed)
States she was coughing up blood prior to arrival, denies shortness of breath

## 2020-11-02 NOTE — ED Provider Notes (Signed)
[facility] EMERGENCY DEPARTMENT Provider Note   CSN: [id number] Arrival date & time: 11/02/20  1540     History No chief complaint on file.   Marisa Bailey is a 80 y.o. female with a history of COPD, CHF, hypertension who is on home oxygen at 2 L at night only presenting with hemoptysis which started today shortly before arriving here.  She denies shortness of breath beyond her baseline, fevers or chills, also no chest pain, nausea, vomiting or abdominal pain.  She denies dizziness or weakness.  She woke this morning feeling well.  She is fully Covid vaccinated.  The history is provided by the patient and a relative (neice at bedside).      Past Medical History:  Diagnosis Date   Blind right eye    Bronchitis    CHF (congestive heart failure) (HCC)    COPD (chronic obstructive pulmonary disease) ([place])    Hypertension     Patient Active Problem List   Diagnosis Date Noted   Acute on chronic respiratory failure with hypoxia and hypercapnia ([place]) 05/26/2019   Acute Respiratory failure with hypoxia ([place]) 01/11/2019   Acute respiratory failure ([place]) 01/10/2019   Acute respiratory failure with hypoxia and hypercapnia ([place]) 08/01/2018   Acute lower UTI 08/01/2018   Hypoxia    Leg swelling    Obesity (BMI 30-39.9) 01/24/2018   Acute respiratory failure with hypoxia ([place]) 06/27/2017   COPD (chronic obstructive pulmonary disease) ([place]) 06/27/2017   Acute exacerbation of CHF (congestive heart failure) ([place]) 06/27/2017   Tobacco abuse 06/27/2017   Hypertension    Muscle weakness (generalized) 08/17/2012   Pain in joint, shoulder region 08/17/2012   Fx upper humerus-closed 08/17/2012    Past Surgical History:  Procedure Laterality Date   ENUCLEATION       OB History   No obstetric history on file.     Family History  Problem Relation Age of Onset   Diabetes Mother     Social History   Tobacco Use   Smoking status: Every Day    Packs/day: 1.00    Years: 18.00     Pack years: 18.00    Types: Cigarettes   Smokeless tobacco: Never  Vaping Use   Vaping Use: Never used  Substance Use Topics   Alcohol use: No    Comment: rarely   Drug use: No    Home Medications Prior to Admission medications   Medication Sig Start Date End Date Taking? Authorizing Provider  acetaminophen (TYLENOL) 325 MG tablet Take 2 tablets (650 mg total) by mouth every 6 (six) hours as needed for mild pain (or Fever >/= 101). 01/14/19  Yes Emokpae, Courage, MD  albuterol (PROVENTIL) (2.5 MG/3ML) 0.083% nebulizer solution Take 3 mLs (2.5 mg total) by nebulization every 6 (six) hours as needed for wheezing or shortness of breath. 05/28/19  Yes Emokpae, Courage, MD  amLODipine (NORVASC) 10 MG tablet Take 1 tablet (10 mg total) by mouth daily. 10/02/20  Yes Ailene Ards, NP  aspirin EC 81 MG tablet Take 1 tablet (81 mg total) by mouth daily with breakfast. 05/28/19  Yes Emokpae, Courage, MD  atorvastatin (LIPITOR) 20 MG tablet Take 1 tablet (20 mg total) by mouth daily. 10/02/20  Yes Ailene Ards, NP  cetirizine (ZYRTEC) 10 MG tablet Take 10 mg by mouth daily.   Yes [provider]  Cholecalciferol (VITAMIN D3) 1.25 MG (50000 UT) TABS Take 1 tablet by mouth daily.   Yes [provider]  folic acid (FOLVITE) 1 MG tablet Take 1 tablet (1 mg total) by mouth daily. 10/02/20  Yes Ailene Ards, NP  metFORMIN (GLUCOPHAGE) 500 MG tablet Take 1 tablet (500 mg total) by mouth 2 (two) times daily. 10/02/20  Yes Ailene Ards, NP  olmesartan (BENICAR) 40 MG tablet Take 1 tablet (40 mg total) by mouth daily. 10/02/20  Yes Ailene Ards, NP  PROAIR HFA 108 [phone] Base) MCG/ACT inhaler Inhale 2 puffs into the lungs every 4 (four) hours as needed for wheezing or shortness of breath. 05/28/19  Yes Emokpae, Courage, MD  Zinc 50 MG CAPS Take 1 capsule by mouth daily.   Yes [provider]  EPINEPHrine 0.3 mg/0.3 mL IJ SOAJ injection Inject 0.3 mg into the muscle as needed for  anaphylaxis. Patient not taking: No sig reported 11/23/19   Couture, Cortni S, PA-C  furosemide (LASIX) 40 MG tablet Take 1 tablet (40 mg total) by mouth 2 (two) times daily. Patient not taking: Reported on 11/02/2020 05/28/19   Roxan Hockey, MD  meloxicam (MOBIC) 15 MG tablet Take 1 tablet (15 mg total) by mouth daily as needed. Patient not taking: Reported on 11/02/2020 10/02/20   Ailene Ards, NP  mometasone-formoterol [facility]) 200-5 MCG/ACT AERO Inhale 2 puffs into the lungs 2 (two) times daily. Patient not taking: Reported on 11/02/2020 05/28/19   Roxan Hockey, MD  ondansetron (ZOFRAN) 4 MG tablet Take 1 tablet (4 mg total) by mouth every 6 (six) hours. Patient not taking: No sig reported 11/23/19   Couture, Cortni S, PA-C  potassium chloride SA (KLOR-CON) 20 MEQ tablet Take 1 tablet (20 mEq total) by mouth daily. Patient not taking: Reported on 11/02/2020 05/29/19   Roxan Hockey, MD    Allergies    Penicillins  Review of Systems   Review of Systems  Constitutional:  Negative for chills and fever.  HENT:  Negative for congestion and sore throat.   Eyes: Negative.   Respiratory:  Positive for cough and shortness of breath. Negative for chest tightness.        Per HPI, chronic shortness of breath not worsened today.  Cardiovascular:  Negative for chest pain.  Gastrointestinal:  Negative for abdominal pain, nausea and vomiting.  Genitourinary: Negative.   Musculoskeletal:  Negative for arthralgias, joint swelling and neck pain.  Skin: Negative.  Negative for rash and wound.  Neurological:  Negative for dizziness, weakness, light-headedness, numbness and headaches.  Psychiatric/Behavioral: Negative.     Physical Exam Updated Vital Signs BP (!) 180/76   Pulse 80   Temp 98 F (36.7 C) (Oral)   Resp (!) 21   Ht _0  (1.626 m)   Wt 70.8 kg   SpO2 97%   BMI 26.79 kg/m   Physical Exam Vitals and nursing note reviewed.  Constitutional:      Appearance: She is  well-developed.  HENT:     Head: Normocephalic and atraumatic.  Eyes:     Conjunctiva/sclera: Conjunctivae normal.  Cardiovascular:     Rate and Rhythm: Normal rate and regular rhythm.     Heart sounds: Normal heart sounds.  Pulmonary:     Effort: Pulmonary effort is normal.     Breath sounds: Rhonchi present. No wheezing.     Comments: Bilateral rhonchi, most prominent at left base.  She presents with paper towels fully saturated with blood. Abdominal:     General: Bowel sounds are normal.     Palpations: Abdomen is soft.  Tenderness: There is no abdominal tenderness.  Musculoskeletal:        General: Normal range of motion.     Cervical back: Normal range of motion.     Right lower leg: No edema.     Left lower leg: No edema.  Skin:    General: Skin is warm and dry.  Neurological:     Mental Status: She is alert.    ED Results / Procedures / Treatments   Labs (all labs ordered are listed, but only abnormal results are displayed) Labs Reviewed  CBC WITH DIFFERENTIAL/PLATELET - Abnormal; Notable for the following components:      Result Value   Eosinophils Absolute 0.9 (*)    All other components within normal limits  BASIC METABOLIC PANEL - Abnormal; Notable for the following components:   Glucose, Bld 117 (*)    Creatinine, Ser 1.51 (*)    GFR, Estimated 35 (*)    All other components within normal limits  RESP PANEL BY RT-PCR (FLU A&B, COVID) [id number]    EKG None  Radiology DG Chest 2 View  Result Date: 11/02/2020 CLINICAL DATA:  Hemoptysis. EXAM: CHEST - 2 VIEW COMPARISON:  05/26/2019 FINDINGS: Cardiac enlargement. No vascular congestion. Interstitial and airspace infiltrates in both lung bases, greater on the right. This likely represents multifocal pneumonia although asymmetrical edema could possibly have this appearance. Alveolar hemorrhage is possible in the setting of hemoptysis. No pleural effusions. No pneumothorax. Calcification of the aorta. Degenerative  changes in the spine and shoulders. IMPRESSION: 1. Cardiac enlargement. 2. Infiltrates in both lung bases likely representing multifocal pneumonia. Electronically Signed   By: Lucienne Capers M.D.   On: 11/02/2020 19:27   CT Angio Chest PE W and/or Wo Contrast  Result Date: 11/02/2020 CLINICAL DATA:  Pulmonary embolus suspected with high probability. EXAM: CT ANGIOGRAPHY CHEST WITH CONTRAST TECHNIQUE: Multidetector CT imaging of the chest was performed using the standard protocol during bolus administration of intravenous contrast. Multiplanar CT image reconstructions and MIPs were obtained to evaluate the vascular anatomy. CONTRAST:  41m OMNIPAQUE IOHEXOL 350 MG/ML SOLN COMPARISON:  01/10/2019 FINDINGS: Cardiovascular: There is good opacification of the central and segmental pulmonary arteries. No focal filling defects. No evidence of significant pulmonary embolus. Cardiac enlargement. No pericardial effusions. Calcification of the aorta and coronary arteries. No aortic aneurysm or dissection. Mediastinum/Nodes: Esophagus is decompressed. No significant lymphadenopathy. Thyroid gland is unremarkable. Lungs/Pleura: Patchy areas of infiltration and consolidation in the lung bases. Opacification of multiple right central and peripheral airways. Changes likely to represent aspiration. Pneumonia with airways secretion would be a less likely consideration due to the amount of debris present. No pleural effusions. No pneumothorax. Upper Abdomen: No acute abnormalities demonstrated in the visualized upper abdomen. Musculoskeletal: No chest wall abnormality. No acute or significant osseous findings. Review of the MIP images confirms the above findings. IMPRESSION: 1. No evidence of significant pulmonary embolus. 2. Infiltration and consolidation in the lung bases with debris filling multiple right central and peripheral airways. Changes likely represent aspiration. Pneumonia with airways secretion would be another  consideration. Electronically Signed   By: WLucienne CapersM.D.   On: 11/02/2020 23:18    Procedures Procedures   Medications Ordered in ED Medications  levofloxacin (LEVAQUIN) IVPB 750 mg (750 mg Intravenous New Bag/Given 11/02/20 2227)  iohexol (OMNIPAQUE) 350 MG/ML injection 80 mL (80 mLs Intravenous Contrast Given 11/02/20 2255)    ED Course  I have reviewed the triage vital signs and the nursing notes.  Pertinent  labs & imaging results that were available during my care of the patient were reviewed by me and considered in my medical decision making (see chart for details).    MDM Rules/Calculators/A&P                           Patient with community-acquired pneumonia, significant hemoptysis and hypoxia.  She was 85% on room air upon arrival here.  She was probably put on 2 L and her oxygen improved to low 90s.  With any significant exertion however she drops back down to the mid to low 80s.  She had significant amounts of hemoptysis, initially presenting with a completely saturated paper towel, she continued to cough and expectorate into a emesis bag with multiple clots of blood being obtained.  CT imaging was performed to rule out PE.  Patient is pen allergic therefore she was given IV dose of Levaquin.  Called to hospitalist for admission.  Discussed with Dr. Josephine Cables who accepts pt for admission. Final Clinical Impression(s) / ED Diagnoses Final diagnoses:  Community acquired pneumonia, unspecified laterality  Hemoptysis  Hypoxia    Rx / DC Orders ED Discharge Orders     None        Landis Martins 11/02/20 2358    Noemi Chapel, MD 11/07/20 1304

## 2020-11-03 ENCOUNTER — Encounter (HOSPITAL_COMMUNITY): Payer: Self-pay | Admitting: Internal Medicine

## 2020-11-03 DIAGNOSIS — I10 Essential (primary) hypertension: Secondary | ICD-10-CM

## 2020-11-03 DIAGNOSIS — R042 Hemoptysis: Secondary | ICD-10-CM

## 2020-11-03 DIAGNOSIS — J449 Chronic obstructive pulmonary disease, unspecified: Secondary | ICD-10-CM

## 2020-11-03 DIAGNOSIS — J690 Pneumonitis due to inhalation of food and vomit: Secondary | ICD-10-CM

## 2020-11-03 DIAGNOSIS — I5030 Unspecified diastolic (congestive) heart failure: Secondary | ICD-10-CM

## 2020-11-03 DIAGNOSIS — J189 Pneumonia, unspecified organism: Secondary | ICD-10-CM | POA: Insufficient documentation

## 2020-11-03 DIAGNOSIS — N179 Acute kidney failure, unspecified: Secondary | ICD-10-CM

## 2020-11-03 DIAGNOSIS — J9611 Chronic respiratory failure with hypoxia: Secondary | ICD-10-CM | POA: Diagnosis not present

## 2020-11-03 DIAGNOSIS — Z720 Tobacco use: Secondary | ICD-10-CM

## 2020-11-03 HISTORY — DX: Pneumonitis due to inhalation of food and vomit: J69.0

## 2020-11-03 LAB — CBC
HCT: 35.9 % — ABNORMAL LOW (ref 36.0–46.0)
Hemoglobin: 10.9 g/dL — ABNORMAL LOW (ref 12.0–15.0)
MCH: 27.9 pg (ref 26.0–34.0)
MCHC: 30.4 g/dL (ref 30.0–36.0)
MCV: 91.8 fL (ref 80.0–100.0)
Platelets: 236 10*3/uL (ref 150–400)
RBC: 3.91 MIL/uL (ref 3.87–5.11)
RDW: 14.2 % (ref 11.5–15.5)
WBC: 6.2 10*3/uL (ref 4.0–10.5)
nRBC: 0 % (ref 0.0–0.2)

## 2020-11-03 LAB — COMPREHENSIVE METABOLIC PANEL
ALT: 12 U/L (ref 0–44)
AST: 13 U/L — ABNORMAL LOW (ref 15–41)
Albumin: 3.5 g/dL (ref 3.5–5.0)
Alkaline Phosphatase: 109 U/L (ref 38–126)
Anion gap: 9 (ref 5–15)
BUN: 17 mg/dL (ref 8–23)
CO2: 26 mmol/L (ref 22–32)
Calcium: 8.5 mg/dL — ABNORMAL LOW (ref 8.9–10.3)
Chloride: 101 mmol/L (ref 98–111)
Creatinine, Ser: 0.99 mg/dL (ref 0.44–1.00)
GFR, Estimated: 58 mL/min — ABNORMAL LOW (ref >60)
Glucose, Bld: 88 mg/dL (ref 70–99)
Potassium: 4.8 mmol/L (ref 3.5–5.1)
Sodium: 136 mmol/L (ref 135–145)
Total Bilirubin: 0.4 mg/dL (ref 0.3–1.2)
Total Protein: 7 g/dL (ref 6.5–8.1)

## 2020-11-03 LAB — STREP PNEUMONIAE URINARY ANTIGEN: Strep Pneumo Urinary Antigen: NEGATIVE

## 2020-11-03 LAB — PHOSPHORUS: Phosphorus: 3.3 mg/dL (ref 2.5–4.6)

## 2020-11-03 LAB — PROTIME-INR
INR: 1.1 (ref 0.8–1.2)
Prothrombin Time: 13.9 seconds (ref 11.4–15.2)

## 2020-11-03 LAB — MAGNESIUM: Magnesium: 2 mg/dL (ref 1.7–2.4)

## 2020-11-03 LAB — APTT: aPTT: 31 seconds (ref 24–36)

## 2020-11-03 MED ORDER — DM-GUAIFENESIN ER 30-600 MG PO TB12
1.0000 | ORAL_TABLET | Freq: Two times a day (BID) | ORAL | Status: DC
  Administered 2020-11-03 – 2020-11-04 (×3): 1 via ORAL
  Filled 2020-11-03 (×4): qty 1

## 2020-11-03 MED ORDER — ASPIRIN EC 81 MG PO TBEC
81.0000 mg | DELAYED_RELEASE_TABLET | Freq: Every day | ORAL | Status: DC
  Administered 2020-11-03 – 2020-11-04 (×2): 81 mg via ORAL
  Filled 2020-11-03 (×2): qty 1

## 2020-11-03 MED ORDER — METRONIDAZOLE 500 MG/100ML IV SOLN
500.0000 mg | Freq: Two times a day (BID) | INTRAVENOUS | Status: DC
  Administered 2020-11-03 – 2020-11-04 (×3): 500 mg via INTRAVENOUS
  Filled 2020-11-03 (×3): qty 100

## 2020-11-03 MED ORDER — IRBESARTAN 150 MG PO TABS
300.0000 mg | ORAL_TABLET | Freq: Every day | ORAL | Status: DC
  Administered 2020-11-03 – 2020-11-04 (×2): 300 mg via ORAL
  Filled 2020-11-03 (×2): qty 2

## 2020-11-03 MED ORDER — AMLODIPINE BESYLATE 5 MG PO TABS
10.0000 mg | ORAL_TABLET | Freq: Every day | ORAL | Status: DC
Start: 2020-11-03 — End: 2020-11-04
  Administered 2020-11-03 – 2020-11-04 (×2): 10 mg via ORAL
  Filled 2020-11-03 (×2): qty 2

## 2020-11-03 MED ORDER — SODIUM CHLORIDE 0.9 % IV SOLN: Freq: Once | INTRAVENOUS | Status: AC

## 2020-11-03 MED ORDER — ATORVASTATIN CALCIUM 20 MG PO TABS
20.0000 mg | ORAL_TABLET | Freq: Every day | ORAL | Status: DC
  Administered 2020-11-03 – 2020-11-04 (×2): 20 mg via ORAL
  Filled 2020-11-03 (×2): qty 1

## 2020-11-03 MED ORDER — ALBUTEROL SULFATE (2.5 MG/3ML) 0.083% IN NEBU: 2.5000 mg | INHALATION_SOLUTION | Freq: Four times a day (QID) | RESPIRATORY_TRACT | Status: DC | PRN

## 2020-11-03 MED ORDER — SODIUM CHLORIDE 0.9 % IV SOLN
1.0000 g | INTRAVENOUS | Status: DC
  Administered 2020-11-03 – 2020-11-04 (×2): 1 g via INTRAVENOUS
  Filled 2020-11-03 (×2): qty 10

## 2020-11-03 NOTE — Progress Notes (Signed)
PROGRESS NOTE    Marisa Bailey  ZOX:[id number] DOB: 1940-12-25 DOA: 11/02/2020 PCP: Merryl Hacker, No     Brief Narrative: Marisa Bailey is a 80 year old female who presented to the Ed with hemoptysis. HX includes COPD, CHF with preserved ejection fraction, HTN, and several previous episodes of pneumonia and respiratory failure. She reports that the afternoon of 11/02/20 she has begun coughing more, has coughed up blood. She denies fever, denies chills, denies purulent sputum, denies preceding illness. She reports she still smokes 1ppd cigarettes, is on 2lpm home o2 constantly. Currently she is on 2lpm oxygen via nasal cannula, is interactive, and has a coarse cough. Right lower lung sounds are coarse rhonchi. Right upper is clear. Left lower and upper are clear.  Assessment & Plan:  Principal Problem:  Aspiration pneumonia -sudden onset of symptoms without fever, chills, illness suggestive of aspiration -Chest CTA for assessment of PE did not find PE and was suggestive of aspiration pneumonia, supported by lack of infectious suggestions in labs -wbc 6.2, afebrile -ceftriaxone 1g IV q24hrs, metronidazole 546m IV q12hrs, and mucinex DM -initial dose of 7563mlevofloxacin IV was given when CAP was considered, chest CT was much more  suggestive of aspiration -awaiting a sputum assessment -liquids are thin liquids, possibly investigate swallow upon resolution of pna  Active Problems:   COPD -Albuterol 2.92m22m6hrs for shortness of breath -2lpm o2 continuous unless her oxygen needs require further increases  Acute Kidney injury -initial creatinine was 1.51 on 9/22, has since improved to 0.99 with hydration    Essential hypertension -continuing home amlodipine 44m28mily -continuing home irbesartan 300mg61mly   Tobacco abuse -discuss tobacco cessation -emphasize dangers of smoking with home oxygen   Chronic respiratory failure with hypoxia -continuous 2lmp o2 via nasal canula      Cough  with hemoptysis -secondary to aspiration pneumonia    (HFpEF) heart failure with preserved ejection fraction -currently no signs of fluid overload      DVT prophylaxis: scd Code Status: full  Disposition Plan: 2-3 days with antibiotics, evaluate swallow, d/c home    Antimicrobials: 1gm iv ceftriaxone q24hrs, 500mg 68metronidazole q12 hrs   Subjective: Patient is coughing, pleasant, her complaint is about having at least 8-9 previous episodes of pna.  Objective: Vitals:   11/03/20 0204 11/03/20 0601 11/03/20 0945 11/03/20 1112  BP: (!) 166/92 (!) 158/89 (!) 142/73 (!) 142/73  Pulse: 83 90 64   Resp:  20 16   Temp: 97.9 F (36.6 C) 98.4 F (36.9 C) 98.4 F (36.9 C)   TempSrc:   Oral   SpO2: 100% 100% 99%   Weight: 70.6 kg     Height: _0  (1.626 m)       Intake/Output Summary (Last 24 hours) at 11/03/2020 1223 Last data filed at 11/03/2020 0900 Gross per 24 hour  Intake 390 ml  Output --  Net 390 ml   Filed Weights   11/02/20 1556 11/03/20 0204  Weight: 70.8 kg 70.6 kg    Examination:  General exam: Appears calm and comfortable, seated in bed, interactive  Respiratory system: coarse cough, coarse rhonchi in right lower lobes, right upper clear, left clear. Normal respiratory rate and effort.  Cardiovascular system: S1 & S2 heard, RRR. No JVD, murmurs, rubs, gallops or clicks. No pedal edema.  Gastrointestinal system: Abdomen is nondistended, soft and nontender. No organomegaly or masses felt. Normal bowel sounds heard.  Central nervous system: Alert and oriented. No focal neurological deficits.  Extremities:  Moves all extremities appropriately, no pedal edema  Skin: no rashes or lesions  Psychiatry: Judgement and insight appear normal. Mood & affect appropriate.     Data Reviewed: I have personally reviewed following labs and imaging studies  CBC: Recent Labs  Lab 11/02/20 1725 11/03/20 0518  WBC 6.3 6.2  NEUTROABS 3.8  --   HGB 12.2 10.9*   HCT 40.1 35.9*  MCV 94.4 91.8  PLT 264 650   Basic Metabolic Panel: Recent Labs  Lab 11/02/20 1725 11/03/20 0518  NA 138 136  K 4.8 4.8  CL 103 101  CO2 27 26  GLUCOSE 117* 88  BUN 16 17  CREATININE 1.51* 0.99  CALCIUM 8.9 8.5*  MG  --  2.0  PHOS  --  3.3   GFR: Estimated Creatinine Clearance: 43.7 mL/min (by C-G formula based on SCr of 0.99 mg/dL). Liver Function Tests: Recent Labs  Lab 11/03/20 0518  AST 13*  ALT 12  ALKPHOS 109  BILITOT 0.4  PROT 7.0  ALBUMIN 3.5    Coagulation Profile: Recent Labs  Lab 11/03/20 0518  INR 1.1   Urine analysis:    Component Value Date/Time   COLORURINE YELLOW 11/23/2019 1240   APPEARANCEUR HAZY (A) 11/23/2019 1240   LABSPEC 1.019 11/23/2019 1240   PHURINE 5.0 11/23/2019 1240   GLUCOSEU NEGATIVE 11/23/2019 1240   HGBUR MODERATE (A) 11/23/2019 1240   BILIRUBINUR NEGATIVE 11/23/2019 [place] 11/23/2019 1240   PROTEINUR NEGATIVE 11/23/2019 1240   NITRITE NEGATIVE 11/23/2019 1240   LEUKOCYTESUR NEGATIVE 11/23/2019 1240   Sepsis Labs: _0 (procalcitonin:4,lacticidven:4)  ) Recent Results (from the past 240 hour(s))  Resp Panel by RT-PCR (Flu A&B, Covid) Nasopharyngeal Swab     Status: None   Collection Time: 11/02/20  8:07 PM   Specimen: Nasopharyngeal Swab; Nasopharyngeal(NP) swabs in vial transport medium  Result Value Ref Range Status   SARS Coronavirus 2 by RT PCR NEGATIVE NEGATIVE Final    Comment: (NOTE) SARS-CoV-2 target nucleic acids are NOT DETECTED.  The SARS-CoV-2 RNA is generally detectable in upper respiratory specimens during the acute phase of infection. The lowest concentration of SARS-CoV-2 viral copies this assay can detect is 138 copies/mL. A negative result does not preclude SARS-Cov-2 infection and should not be used as the sole basis for treatment or other patient management decisions. A negative result may occur with  improper specimen collection/handling, submission  of specimen other than nasopharyngeal swab, presence of viral mutation(s) within the areas targeted by this assay, and inadequate number of viral copies(<138 copies/mL). A negative result must be combined with clinical observations, patient history, and epidemiological information. The expected result is Negative.  Fact Sheet for Patients:  EntrepreneurPulse.com.au  Fact Sheet for Healthcare Providers:  IncredibleEmployment.be  This test is no t yet approved or cleared by the [place] FDA and  has been authorized for detection and/or diagnosis of SARS-CoV-2 by FDA under an Emergency Use Authorization (EUA). This EUA will remain  in effect (meaning this test can be used) for the duration of the COVID-19 declaration under Section 564(b)(1) of the Act, 21 U.S.C.section 360bbb-3(b)(1), unless the authorization is terminated  or revoked sooner.       Influenza A by PCR NEGATIVE NEGATIVE Final   Influenza B by PCR NEGATIVE NEGATIVE Final    Comment: (NOTE) The Xpert Xpress SARS-CoV-2/FLU/RSV plus assay is intended as an aid in the diagnosis of influenza from Nasopharyngeal swab specimens and should not be used as a sole basis  for treatment. Nasal washings and aspirates are unacceptable for Xpert Xpress SARS-CoV-2/FLU/RSV testing.  Fact Sheet for Patients: EntrepreneurPulse.com.au  Fact Sheet for Healthcare Providers: IncredibleEmployment.be  This test is not yet approved or cleared by the [place] FDA and has been authorized for detection and/or diagnosis of SARS-CoV-2 by FDA under an Emergency Use Authorization (EUA). This EUA will remain in effect (meaning this test can be used) for the duration of the COVID-19 declaration under Section 564(b)(1) of the Act, 21 U.S.C. section 360bbb-3(b)(1), unless the authorization is terminated or revoked.  Performed at [facility], [address],  [place], [place] [address]          Radiology Studies: DG Chest 2 View  Result Date: 11/02/2020 CLINICAL DATA:  Hemoptysis. EXAM: CHEST - 2 VIEW COMPARISON:  05/26/2019 FINDINGS: Cardiac enlargement. No vascular congestion. Interstitial and airspace infiltrates in both lung bases, greater on the right. This likely represents multifocal pneumonia although asymmetrical edema could possibly have this appearance. Alveolar hemorrhage is possible in the setting of hemoptysis. No pleural effusions. No pneumothorax. Calcification of the aorta. Degenerative changes in the spine and shoulders. IMPRESSION: 1. Cardiac enlargement. 2. Infiltrates in both lung bases likely representing multifocal pneumonia. Electronically Signed   By: Lucienne Capers M.D.   On: 11/02/2020 19:27   CT Angio Chest PE W and/or Wo Contrast  Result Date: 11/02/2020 CLINICAL DATA:  Pulmonary embolus suspected with high probability. EXAM: CT ANGIOGRAPHY CHEST WITH CONTRAST TECHNIQUE: Multidetector CT imaging of the chest was performed using the standard protocol during bolus administration of intravenous contrast. Multiplanar CT image reconstructions and MIPs were obtained to evaluate the vascular anatomy. CONTRAST:  87m OMNIPAQUE IOHEXOL 350 MG/ML SOLN COMPARISON:  01/10/2019 FINDINGS: Cardiovascular: There is good opacification of the central and segmental pulmonary arteries. No focal filling defects. No evidence of significant pulmonary embolus. Cardiac enlargement. No pericardial effusions. Calcification of the aorta and coronary arteries. No aortic aneurysm or dissection. Mediastinum/Nodes: Esophagus is decompressed. No significant lymphadenopathy. Thyroid gland is unremarkable. Lungs/Pleura: Patchy areas of infiltration and consolidation in the lung bases. Opacification of multiple right central and peripheral airways. Changes likely to represent aspiration. Pneumonia with airways secretion would be a less likely consideration due to the  amount of debris present. No pleural effusions. No pneumothorax. Upper Abdomen: No acute abnormalities demonstrated in the visualized upper abdomen. Musculoskeletal: No chest wall abnormality. No acute or significant osseous findings. Review of the MIP images confirms the above findings. IMPRESSION: 1. No evidence of significant pulmonary embolus. 2. Infiltration and consolidation in the lung bases with debris filling multiple right central and peripheral airways. Changes likely represent aspiration. Pneumonia with airways secretion would be another consideration. Electronically Signed   By: WLucienne CapersM.D.   On: 11/02/2020 23:18        Scheduled Meds:  amLODipine  10 mg Oral Daily   aspirin EC  81 mg Oral Q breakfast   atorvastatin  20 mg Oral Daily   dextromethorphan-guaiFENesin  1 tablet Oral BID   irbesartan  300 mg Oral Daily   Continuous Infusions:  cefTRIAXone (ROCEPHIN)  IV 1 g (11/03/20 1127)   metronidazole 500 mg (11/03/20 0759)     LOS: 0 days     CEllie Lunch student [facility] Pager [phone]  If 7PM-7AM, please contact night-coverage [email or web address] Password [email or web address]9/23/2022, 12:23 PM

## 2020-11-03 NOTE — H&P (Signed)
History and Physical  Marisa Bailey NAT:[id number] DOB: [birth year] DOA: 11/02/2020  Referring physician: Evalee Jefferson, PA-C  PCP: Pcp, No  Patient coming from: Home  Chief Complaint: Blood in sputum  HPI: Marisa Bailey is a 80 y.o. female with medical history significant for HFpEF, COPD, hypertension, tobacco abuse, chronic respiratory failure with supplemental oxygen at 2 LPM via [place] who presents to the emergency department due to cough with blood in sputum which started few hours prior to arrival to the ED yesterday.  She denies chest pain, shortness of breath, fever, chills, nausea.  Patient states that she was already vaccinated against COVID.  She continues to smoke cigarettes, but denies alcohol or any other recreational drug use.  ED Course:  In the emergency department, BP was elevated at 186/77 but other vital signs are within normal range.  Work-up in the ED showed normal CBC and normal BMP except for creatinine of 1.51 (baseline creatinine is 0.7-1.0).  Influenza A, B, SARS coronavirus 2 was negative. CT angiography chest with contrast showed no evidence of significant pulmonary embolus. Infiltration and consolidation in the lung bases with debris filling multiple right central and peripheral airways. Changes likely represent aspiration. Pneumonia with airways secretion would be another consideration. Patient was treated with IV Levaquin 750 mg x 1.  Hospitalist was asked to admit patient for further evaluation and management.   Review of Systems: Constitutional: Negative for chills and fever.  HENT: Negative for ear pain and sore throat.   Eyes: Negative for pain and visual disturbance.  Respiratory: Positive for cough and hemoptysis, negative for shortness of breath.   Cardiovascular: Negative for chest pain and palpitations.  Gastrointestinal: Negative for abdominal pain and vomiting.  Endocrine: Negative for polyphagia and polyuria.  Genitourinary: Negative for decreased urine  volume, dysuria, enuresis Musculoskeletal: Negative for arthralgias and back pain.  Skin: Negative for color change and rash.  Allergic/Immunologic: Negative for immunocompromised state.  Neurological: Negative for tremors, syncope, speech difficulty Hematological: Does not bruise/bleed easily.  All other systems reviewed and are negative    Past Medical History:  Diagnosis Date   Blind right eye    Bronchitis    CHF (congestive heart failure) (HCC)    COPD (chronic obstructive pulmonary disease) ([place])    Hypertension    Past Surgical History:  Procedure Laterality Date   ENUCLEATION      Social History:  reports that she has been smoking cigarettes. She has a 18.00 pack-year smoking history. She has never used smokeless tobacco. She reports that she does not drink alcohol and does not use drugs.   Allergies  Allergen Reactions   Penicillins Rash    Has patient had a PCN reaction causing immediate rash, facial/tongue/throat swelling, SOB or lightheadedness with hypotension: {Yes/No:[id number] Has patient had a PCN reaction causing severe rash involving mucus membranes or skin necrosis: Yes Has patient had a PCN reaction that required hospitalization No Has patient had a PCN reaction occurring within the last 10 years: No If all of the above answers are "NO", then may proceed with Cephalosporin use.  **Has tolerated keflex     Family History  Problem Relation Age of Onset   Diabetes Mother      Prior to Admission medications   Medication Sig Start Date End Date Taking? Authorizing Provider  acetaminophen (TYLENOL) 325 MG tablet Take 2 tablets (650 mg total) by mouth every 6 (six) hours as needed for mild pain (or Fever >/= 101). 01/14/19  Yes Emokpae,  Courage, MD  albuterol (PROVENTIL) (2.5 MG/3ML) 0.083% nebulizer solution Take 3 mLs (2.5 mg total) by nebulization every 6 (six) hours as needed for wheezing or shortness of breath. 05/28/19  Yes Emokpae, Courage, MD   amLODipine (NORVASC) 10 MG tablet Take 1 tablet (10 mg total) by mouth daily. 10/02/20  Yes Ailene Ards, NP  aspirin EC 81 MG tablet Take 1 tablet (81 mg total) by mouth daily with breakfast. 05/28/19  Yes Emokpae, Courage, MD  atorvastatin (LIPITOR) 20 MG tablet Take 1 tablet (20 mg total) by mouth daily. 10/02/20  Yes Ailene Ards, NP  cetirizine (ZYRTEC) 10 MG tablet Take 10 mg by mouth daily.   Yes [provider]  Cholecalciferol (VITAMIN D3) 1.25 MG (50000 UT) TABS Take 1 tablet by mouth daily.   Yes [provider]  folic acid (FOLVITE) 1 MG tablet Take 1 tablet (1 mg total) by mouth daily. 10/02/20  Yes Ailene Ards, NP  metFORMIN (GLUCOPHAGE) 500 MG tablet Take 1 tablet (500 mg total) by mouth 2 (two) times daily. 10/02/20  Yes Ailene Ards, NP  olmesartan (BENICAR) 40 MG tablet Take 1 tablet (40 mg total) by mouth daily. 10/02/20  Yes Ailene Ards, NP  PROAIR HFA 108 [id number] Base) MCG/ACT inhaler Inhale 2 puffs into the lungs every 4 (four) hours as needed for wheezing or shortness of breath. 05/28/19  Yes Emokpae, Courage, MD  Zinc 50 MG CAPS Take 1 capsule by mouth daily.   Yes [provider]  EPINEPHrine 0.3 mg/0.3 mL IJ SOAJ injection Inject 0.3 mg into the muscle as needed for anaphylaxis. Patient not taking: No sig reported 11/23/19   Couture, Cortni S, PA-C  furosemide (LASIX) 40 MG tablet Take 1 tablet (40 mg total) by mouth 2 (two) times daily. Patient not taking: Reported on 11/02/2020 05/28/19   Roxan Hockey, MD  meloxicam (MOBIC) 15 MG tablet Take 1 tablet (15 mg total) by mouth daily as needed. Patient not taking: Reported on 11/02/2020 10/02/20   Ailene Ards, NP  mometasone-formoterol [facility]) 200-5 MCG/ACT AERO Inhale 2 puffs into the lungs 2 (two) times daily. Patient not taking: Reported on 11/02/2020 05/28/19   Roxan Hockey, MD  ondansetron (ZOFRAN) 4 MG tablet Take 1 tablet (4 mg total) by mouth every 6 (six) hours. Patient not taking: No  sig reported 11/23/19   Couture, Cortni S, PA-C  potassium chloride SA (KLOR-CON) 20 MEQ tablet Take 1 tablet (20 mEq total) by mouth daily. Patient not taking: Reported on 11/02/2020 05/29/19   Roxan Hockey, MD    Physical Exam: BP (!) 158/89 (BP Location: Left Arm)   Pulse 90   Temp 98.4 F (36.9 C)   Resp 20   Ht _0  (1.626 m)   Wt 70.6 kg   SpO2 100%   BMI 26.72 kg/m   General: 80 y.o. year-old female well developed well nourished in no acute distress.  Alert and oriented x3. HEENT: NCAT, EOMI Neck: Supple, trachea medial Cardiovascular: Regular rate and rhythm with no rubs or gallops.  No thyromegaly or JVD noted.  No lower extremity edema. 2/4 pulses in all 4 extremities. Respiratory: Rhonchi bilaterally in the lower lobes (L > R).  No rales. Abdomen: Soft, nontender nondistended with normal bowel sounds x4 quadrants. Muskuloskeletal: No cyanosis, clubbing or edema noted bilaterally Neuro: CN II-XII intact, strength 5/5 x 4, sensation, reflexes intact Skin: No ulcerative lesions noted or rashes Psychiatry: Mood is appropriate for condition and  setting          Labs on Admission:  Basic Metabolic Panel: Recent Labs  Lab 11/02/20 1725  NA 138  K 4.8  CL 103  CO2 27  GLUCOSE 117*  BUN 16  CREATININE 1.51*  CALCIUM 8.9   Liver Function Tests: No results for input(s): AST, ALT, ALKPHOS, BILITOT, PROT, ALBUMIN in the last 168 hours. No results for input(s): LIPASE, AMYLASE in the last 168 hours. No results for input(s): AMMONIA in the last 168 hours. CBC: Recent Labs  Lab 11/02/20 1725 11/03/20 0518  WBC 6.3 6.2  NEUTROABS 3.8  --   HGB 12.2 10.9*  HCT 40.1 35.9*  MCV 94.4 91.8  PLT 264 236   Cardiac Enzymes: No results for input(s): CKTOTAL, CKMB, CKMBINDEX, TROPONINI in the last 168 hours.  BNP (last 3 results) No results for input(s): BNP in the last 8760 hours.  ProBNP (last 3 results) No results for input(s): PROBNP in the last 8760  hours.  CBG: No results for input(s): GLUCAP in the last 168 hours.  Radiological Exams on Admission: DG Chest 2 View  Result Date: 11/02/2020 CLINICAL DATA:  Hemoptysis. EXAM: CHEST - 2 VIEW COMPARISON:  05/26/2019 FINDINGS: Cardiac enlargement. No vascular congestion. Interstitial and airspace infiltrates in both lung bases, greater on the right. This likely represents multifocal pneumonia although asymmetrical edema could possibly have this appearance. Alveolar hemorrhage is possible in the setting of hemoptysis. No pleural effusions. No pneumothorax. Calcification of the aorta. Degenerative changes in the spine and shoulders. IMPRESSION: 1. Cardiac enlargement. 2. Infiltrates in both lung bases likely representing multifocal pneumonia. Electronically Signed   By: Lucienne Capers M.D.   On: 11/02/2020 19:27   CT Angio Chest PE W and/or Wo Contrast  Result Date: 11/02/2020 CLINICAL DATA:  Pulmonary embolus suspected with high probability. EXAM: CT ANGIOGRAPHY CHEST WITH CONTRAST TECHNIQUE: Multidetector CT imaging of the chest was performed using the standard protocol during bolus administration of intravenous contrast. Multiplanar CT image reconstructions and MIPs were obtained to evaluate the vascular anatomy. CONTRAST:  23m OMNIPAQUE IOHEXOL 350 MG/ML SOLN COMPARISON:  01/10/2019 FINDINGS: Cardiovascular: There is good opacification of the central and segmental pulmonary arteries. No focal filling defects. No evidence of significant pulmonary embolus. Cardiac enlargement. No pericardial effusions. Calcification of the aorta and coronary arteries. No aortic aneurysm or dissection. Mediastinum/Nodes: Esophagus is decompressed. No significant lymphadenopathy. Thyroid gland is unremarkable. Lungs/Pleura: Patchy areas of infiltration and consolidation in the lung bases. Opacification of multiple right central and peripheral airways. Changes likely to represent aspiration. Pneumonia with airways  secretion would be a less likely consideration due to the amount of debris present. No pleural effusions. No pneumothorax. Upper Abdomen: No acute abnormalities demonstrated in the visualized upper abdomen. Musculoskeletal: No chest wall abnormality. No acute or significant osseous findings. Review of the MIP images confirms the above findings. IMPRESSION: 1. No evidence of significant pulmonary embolus. 2. Infiltration and consolidation in the lung bases with debris filling multiple right central and peripheral airways. Changes likely represent aspiration. Pneumonia with airways secretion would be another consideration. Electronically Signed   By: WLucienne CapersM.D.   On: 11/02/2020 23:18    EKG: I independently viewed the EKG done and my findings are as followed: Sinus rhythm at a rate of 80 bpm  Assessment/Plan Present on Admission:  [place]  Tobacco abuse  Active Problems:   [place]   Essential hypertension  Tobacco abuse   Aspiration pneumonia (HCC)   Hemoptysis   AKI (acute kidney injury) (HCC)   (HFpEF) heart failure with preserved ejection fraction (HCC)  Presumed aspiration pneumonia CT angiography of chest was suggestive of aspiration pneumonia She presented with cough with hemoptysis Continue Mucinex, ceftriaxone and metronidazole Continue incentive spirometry, flutter valve  Blood culture, sputum culture, strep pneumo and urine Legionella pending  Chronic respiratory failure with hypoxia possibly due to COPD (not in acute exacerbation) Continue supplemental oxygen per home regimen Continue Proventil  Acute kidney injury BUN/1.51 (baseline creatinine is 0.7-1.0) Continue gentle hydration Renally adjust medications, avoid nephrotoxic agents/dehydration/hypotension  HFpEF Echocardiogram done on 05/26/2019 showed EF of 50%, with global hypokinesis and mild LVH, and grade 1 diastolic  dysfunction Continue aspirin, Lipitor, Benicar  Essential hypertension (uncontrolled) Continue Benicar, amlodipine  Tobacco abuse Patient was counseled on tobacco abuse cessation   DVT prophylaxis: SCDs  Code Status: Full code  Family Communication: None at bedside  Disposition Plan:  Patient is from:                        home Anticipated DC to:                   SNF or family members home Anticipated DC date:               2-3 days Anticipated DC barriers:         Patient requires inpatient management due to presumed aspiration pneumonia requiring IV antibiotics    Consults called: None  Admission status: Observation    Bernadette Hoit MD [facility]  11/03/2020, 6:27 AM

## 2020-11-04 DIAGNOSIS — R042 Hemoptysis: Secondary | ICD-10-CM | POA: Diagnosis not present

## 2020-11-04 DIAGNOSIS — J690 Pneumonitis due to inhalation of food and vomit: Secondary | ICD-10-CM

## 2020-11-04 LAB — CBC
HCT: 35.2 % — ABNORMAL LOW (ref 36.0–46.0)
Hemoglobin: 10.9 g/dL — ABNORMAL LOW (ref 12.0–15.0)
MCH: 28.8 pg (ref 26.0–34.0)
MCHC: 31 g/dL (ref 30.0–36.0)
MCV: 92.9 fL (ref 80.0–100.0)
Platelets: 236 10*3/uL (ref 150–400)
RBC: 3.79 MIL/uL — ABNORMAL LOW (ref 3.87–5.11)
RDW: 14.4 % (ref 11.5–15.5)
WBC: 5.4 10*3/uL (ref 4.0–10.5)
nRBC: 0 % (ref 0.0–0.2)

## 2020-11-04 LAB — BASIC METABOLIC PANEL
Anion gap: 6 (ref 5–15)
BUN: 13 mg/dL (ref 8–23)
CO2: 28 mmol/L (ref 22–32)
Calcium: 8.6 mg/dL — ABNORMAL LOW (ref 8.9–10.3)
Chloride: 104 mmol/L (ref 98–111)
Creatinine, Ser: 0.75 mg/dL (ref 0.44–1.00)
GFR, Estimated: >60 mL/min (ref >60)
Glucose, Bld: 95 mg/dL (ref 70–99)
Potassium: 4.2 mmol/L (ref 3.5–5.1)
Sodium: 138 mmol/L (ref 135–145)

## 2020-11-04 LAB — MAGNESIUM: Magnesium: 1.9 mg/dL (ref 1.7–2.4)

## 2020-11-04 MED ORDER — DM-GUAIFENESIN ER 30-600 MG PO TB12: 1.0000 | ORAL_TABLET | Freq: Two times a day (BID) | ORAL | 0 refills | Status: AC

## 2020-11-04 MED ORDER — LEVOFLOXACIN 500 MG PO TABS: 500.0000 mg | ORAL_TABLET | Freq: Every day | ORAL | 0 refills | Status: AC

## 2020-11-04 NOTE — Discharge Summary (Signed)
Physician Discharge Summary  Marisa Bailey ERD:[id number] DOB: 1940-11-15 DOA: 11/02/2020  PCP: Pcp, No  Admit date: 11/02/2020  Discharge date: 11/04/2020  Admitted From:Home  Disposition:  Home  Recommendations for Outpatient Follow-up:  Follow up with PCP in 1-2 weeks Continue Levaquin as prescribed for 5 more days to complete course of treatment for aspiration pneumonia Continue on other home medications as prior Follow-up with pulmonology outpatient with referral sent  Home Health: None  Equipment/Devices: Has home 2 L nasal cannula oxygen  Discharge Condition:Stable  CODE STATUS: Full  Diet recommendation: Heart Healthy  Brief/Interim Summary: Marisa Bailey is a 80 year old female who presented to the Ed with hemoptysis. HX includes COPD, CHF with preserved ejection fraction, HTN, and several previous episodes of pneumonia and respiratory failure.  She continued to have some episodes of hemoptysis during this hospitalization, but with stable hemoglobin levels noted.  She had no other significant symptoms such as fevers or chills or dyspnea.  She continues to smoke 1 pack of cigarettes per day and does wear chronic 2 L nasal cannula home oxygen.  She has not required further supplementation during this hospitalization.  She was treated with Rocephin and Flagyl due to concerns for aspiration and has done quite well.  She is tolerating her diet without any issues or concerns and is stable for discharge on oral antibiotics with Levaquin as prescribed as she is allergic to penicillins.  She will be referred to pulmonology outpatient for further evaluation and for consideration of bronchoscopy as needed if hemoptysis persists.  Discharge Diagnoses:  Active Problems:   COPD (chronic obstructive pulmonary disease) (HCC)   Essential hypertension   Tobacco abuse   Chronic respiratory failure with hypoxia (HCC)   Aspiration pneumonia (HCC)   Cough with hemoptysis   AKI (acute kidney  injury) (HCC)   (HFpEF) heart failure with preserved ejection fraction [facility])  Principal discharge diagnosis: Hemoptysis secondary to aspiration pneumonia.  Discharge Instructions  Discharge Instructions     Diet - low sodium heart healthy   Complete by: As directed    Increase activity slowly   Complete by: As directed       Allergies as of 11/04/2020       Reactions   Penicillins Rash   Has patient had a PCN reaction causing immediate rash, facial/tongue/throat swelling, SOB or lightheadedness with hypotension: {Yes/No:[id number] Has patient had a PCN reaction causing severe rash involving mucus membranes or skin necrosis: Yes Has patient had a PCN reaction that required hospitalization No Has patient had a PCN reaction occurring within the last 10 years: No If all of the above answers are "NO", then may proceed with Cephalosporin use. **Has tolerated keflex        Medication List     STOP taking these medications    furosemide 40 MG tablet Commonly known as: LASIX   meloxicam 15 MG tablet Commonly known as: MOBIC   ondansetron 4 MG tablet Commonly known as: ZOFRAN   potassium chloride SA 20 MEQ tablet Commonly known as: KLOR-CON       TAKE these medications    acetaminophen 325 MG tablet Commonly known as: TYLENOL Take 2 tablets (650 mg total) by mouth every 6 (six) hours as needed for mild pain (or Fever >/= 101).   albuterol (2.5 MG/3ML) 0.083% nebulizer solution Commonly known as: PROVENTIL Take 3 mLs (2.5 mg total) by nebulization every 6 (six) hours as needed for wheezing or shortness of breath.   ProAir HFA  108 (90 Base) MCG/ACT inhaler Generic drug: albuterol Inhale 2 puffs into the lungs every 4 (four) hours as needed for wheezing or shortness of breath.   amLODipine 10 MG tablet Commonly known as: NORVASC Take 1 tablet (10 mg total) by mouth daily.   aspirin EC 81 MG tablet Take 1 tablet (81 mg total) by mouth daily with breakfast.    atorvastatin 20 MG tablet Commonly known as: LIPITOR Take 1 tablet (20 mg total) by mouth daily.   cetirizine 10 MG tablet Commonly known as: ZYRTEC Take 10 mg by mouth daily.   dextromethorphan-guaiFENesin 30-600 MG 12hr tablet Commonly known as: MUCINEX DM Take 1 tablet by mouth 2 (two) times daily for 10 days.   EPINEPHrine 0.3 mg/0.3 mL Soaj injection Commonly known as: EPI-PEN Inject 0.3 mg into the muscle as needed for anaphylaxis.   folic acid 1 MG tablet Commonly known as: FOLVITE Take 1 tablet (1 mg total) by mouth daily.   levofloxacin 500 MG tablet Commonly known as: Levaquin Take 1 tablet (500 mg total) by mouth daily for 5 days.   metFORMIN 500 MG tablet Commonly known as: GLUCOPHAGE Take 1 tablet (500 mg total) by mouth 2 (two) times daily.   mometasone-formoterol 200-5 MCG/ACT Aero Commonly known as: DULERA Inhale 2 puffs into the lungs 2 (two) times daily.   olmesartan 40 MG tablet Commonly known as: BENICAR Take 1 tablet (40 mg total) by mouth daily.   Vitamin D3 1.25 MG (50000 UT) Tabs Take 1 tablet by mouth daily.   Zinc 50 MG Caps Take 1 capsule by mouth daily.        Follow-up Information     pcp. Schedule an appointment as soon as possible for a visit in 1 week(s).                 Allergies  Allergen Reactions   Penicillins Rash    Has patient had a PCN reaction causing immediate rash, facial/tongue/throat swelling, SOB or lightheadedness with hypotension: {Yes/No:[id number] Has patient had a PCN reaction causing severe rash involving mucus membranes or skin necrosis: Yes Has patient had a PCN reaction that required hospitalization No Has patient had a PCN reaction occurring within the last 10 years: No If all of the above answers are "NO", then may proceed with Cephalosporin use.  **Has tolerated keflex     Consultations: None   Procedures/Studies: DG Chest 2 View  Result Date: 11/02/2020 CLINICAL DATA:  Hemoptysis.  EXAM: CHEST - 2 VIEW COMPARISON:  05/26/2019 FINDINGS: Cardiac enlargement. No vascular congestion. Interstitial and airspace infiltrates in both lung bases, greater on the right. This likely represents multifocal pneumonia although asymmetrical edema could possibly have this appearance. Alveolar hemorrhage is possible in the setting of hemoptysis. No pleural effusions. No pneumothorax. Calcification of the aorta. Degenerative changes in the spine and shoulders. IMPRESSION: 1. Cardiac enlargement. 2. Infiltrates in both lung bases likely representing multifocal pneumonia. Electronically Signed   By: Lucienne Capers M.D.   On: 11/02/2020 19:27   CT Angio Chest PE W and/or Wo Contrast  Result Date: 11/02/2020 CLINICAL DATA:  Pulmonary embolus suspected with high probability. EXAM: CT ANGIOGRAPHY CHEST WITH CONTRAST TECHNIQUE: Multidetector CT imaging of the chest was performed using the standard protocol during bolus administration of intravenous contrast. Multiplanar CT image reconstructions and MIPs were obtained to evaluate the vascular anatomy. CONTRAST:  79m OMNIPAQUE IOHEXOL 350 MG/ML SOLN COMPARISON:  01/10/2019 FINDINGS: Cardiovascular: There is good opacification of the central and segmental  pulmonary arteries. No focal filling defects. No evidence of significant pulmonary embolus. Cardiac enlargement. No pericardial effusions. Calcification of the aorta and coronary arteries. No aortic aneurysm or dissection. Mediastinum/Nodes: Esophagus is decompressed. No significant lymphadenopathy. Thyroid gland is unremarkable. Lungs/Pleura: Patchy areas of infiltration and consolidation in the lung bases. Opacification of multiple right central and peripheral airways. Changes likely to represent aspiration. Pneumonia with airways secretion would be a less likely consideration due to the amount of debris present. No pleural effusions. No pneumothorax. Upper Abdomen: No acute abnormalities demonstrated in the  visualized upper abdomen. Musculoskeletal: No chest wall abnormality. No acute or significant osseous findings. Review of the MIP images confirms the above findings. IMPRESSION: 1. No evidence of significant pulmonary embolus. 2. Infiltration and consolidation in the lung bases with debris filling multiple right central and peripheral airways. Changes likely represent aspiration. Pneumonia with airways secretion would be another consideration. Electronically Signed   By: Lucienne Capers M.D.   On: 11/02/2020 23:18     Discharge Exam: Vitals:   11/04/20 0454 11/04/20 0909  BP: (!) 143/61 (!) 140/59  Pulse: 63 96  Resp: 18   Temp: 97.9 F (36.6 C)   SpO2: 94%    Vitals:   11/03/20 1602 11/03/20 2005 11/04/20 0454 11/04/20 0909  BP: 99/63 (!) 169/71 (!) 143/61 (!) 140/59  Pulse: 65 66 63 96  Resp: _0 Temp: 98.2 F (36.8 C) 98.6 F (37 C) 97.9 F (36.6 C)   TempSrc: Oral Oral Oral   SpO2: 98% 93% 94%   Weight:      Height:        General: Pt is alert, awake, not in acute distress Cardiovascular: RRR, S1/S2 +, no rubs, no gallops Respiratory: CTA bilaterally, no wheezing, no rhonchi, on 2 L nasal cannula Abdominal: Soft, NT, ND, bowel sounds + Extremities: no edema, no cyanosis    The results of significant diagnostics from this hospitalization (including imaging, microbiology, ancillary and laboratory) are listed below for reference.     Microbiology: Recent Results (from the past 240 hour(s))  Resp Panel by RT-PCR (Flu A&B, Covid) Nasopharyngeal Swab     Status: None   Collection Time: 11/02/20  8:07 PM   Specimen: Nasopharyngeal Swab; Nasopharyngeal(NP) swabs in vial transport medium  Result Value Ref Range Status   SARS Coronavirus 2 by RT PCR NEGATIVE NEGATIVE Final    Comment: (NOTE) SARS-CoV-2 target nucleic acids are NOT DETECTED.  The SARS-CoV-2 RNA is generally detectable in upper respiratory specimens during the acute phase of infection. The  lowest concentration of SARS-CoV-2 viral copies this assay can detect is 138 copies/mL. A negative result does not preclude SARS-Cov-2 infection and should not be used as the sole basis for treatment or other patient management decisions. A negative result may occur with  improper specimen collection/handling, submission of specimen other than nasopharyngeal swab, presence of viral mutation(s) within the areas targeted by this assay, and inadequate number of viral copies(<138 copies/mL). A negative result must be combined with clinical observations, patient history, and epidemiological information. The expected result is Negative.  Fact Sheet for Patients:  EntrepreneurPulse.com.au  Fact Sheet for Healthcare Providers:  IncredibleEmployment.be  This test is no t yet approved or cleared by the [place] FDA and  has been authorized for detection and/or diagnosis of SARS-CoV-2 by FDA under an Emergency Use Authorization (EUA). This EUA will remain  in effect (meaning this test can be used) for the duration of the COVID-19  declaration under Section 564(b)(1) of the Act, 21 U.S.C.section 360bbb-3(b)(1), unless the authorization is terminated  or revoked sooner.       Influenza A by PCR NEGATIVE NEGATIVE Final   Influenza B by PCR NEGATIVE NEGATIVE Final    Comment: (NOTE) The Xpert Xpress SARS-CoV-2/FLU/RSV plus assay is intended as an aid in the diagnosis of influenza from Nasopharyngeal swab specimens and should not be used as a sole basis for treatment. Nasal washings and aspirates are unacceptable for Xpert Xpress SARS-CoV-2/FLU/RSV testing.  Fact Sheet for Patients: EntrepreneurPulse.com.au  Fact Sheet for Healthcare Providers: IncredibleEmployment.be  This test is not yet approved or cleared by the [place] FDA and has been authorized for detection and/or diagnosis of SARS-CoV-2 by FDA under  an Emergency Use Authorization (EUA). This EUA will remain in effect (meaning this test can be used) for the duration of the COVID-19 declaration under Section 564(b)(1) of the Act, 21 U.S.C. section 360bbb-3(b)(1), unless the authorization is terminated or revoked.  Performed at [facility], [address], [place], [place] [address]      Labs: BNP (last 3 results) No results for input(s): BNP in the last 8760 hours. Basic Metabolic Panel: Recent Labs  Lab 11/02/20 1725 11/03/20 0518 11/04/20 0503  NA 138 136 138  K 4.8 4.8 4.2  CL 103 101 104  CO2 _0 GLUCOSE 117* 88 95  BUN _1 CREATININE 1.51* 0.99 0.75  CALCIUM 8.9 8.5* 8.6*  MG  --  2.0 1.9  PHOS  --  3.3  --    Liver Function Tests: Recent Labs  Lab 11/03/20 0518  AST 13*  ALT 12  ALKPHOS 109  BILITOT 0.4  PROT 7.0  ALBUMIN 3.5   No results for input(s): LIPASE, AMYLASE in the last 168 hours. No results for input(s): AMMONIA in the last 168 hours. CBC: Recent Labs  Lab 11/02/20 1725 11/03/20 0518 11/04/20 0503  WBC 6.3 6.2 5.4  NEUTROABS 3.8  --   --   HGB 12.2 10.9* 10.9*  HCT 40.1 35.9* 35.2*  MCV 94.4 91.8 92.9  PLT 264 236 236   Cardiac Enzymes: No results for input(s): CKTOTAL, CKMB, CKMBINDEX, TROPONINI in the last 168 hours. BNP: Invalid input(s): POCBNP CBG: No results for input(s): GLUCAP in the last 168 hours. D-Dimer No results for input(s): DDIMER in the last 72 hours. Hgb A1c No results for input(s): HGBA1C in the last 72 hours. Lipid Profile No results for input(s): CHOL, HDL, LDLCALC, TRIG, CHOLHDL, LDLDIRECT in the last 72 hours. Thyroid function studies No results for input(s): TSH, T4TOTAL, T3FREE, THYROIDAB in the last 72 hours.  Invalid input(s): FREET3 Anemia work up No results for input(s): VITAMINB12, FOLATE, FERRITIN, TIBC, IRON, RETICCTPCT in the last 72 hours. Urinalysis    Component Value Date/Time   COLORURINE YELLOW 11/23/2019 1240    APPEARANCEUR HAZY (A) 11/23/2019 1240   LABSPEC 1.019 11/23/2019 1240   PHURINE 5.0 11/23/2019 1240   GLUCOSEU NEGATIVE 11/23/2019 1240   HGBUR MODERATE (A) 11/23/2019 1240   BILIRUBINUR NEGATIVE 11/23/2019 1240   KETONESUR NEGATIVE 11/23/2019 1240   PROTEINUR NEGATIVE 11/23/2019 1240   NITRITE NEGATIVE 11/23/2019 1240   LEUKOCYTESUR NEGATIVE 11/23/2019 1240   Sepsis Labs Invalid input(s): PROCALCITONIN,  WBC,  LACTICIDVEN Microbiology Recent Results (from the past 240 hour(s))  Resp Panel by RT-PCR (Flu A&B, Covid) Nasopharyngeal Swab     Status: None   Collection Time: 11/02/20  8:07 PM   Specimen:  Nasopharyngeal Swab; Nasopharyngeal(NP) swabs in vial transport medium  Result Value Ref Range Status   SARS Coronavirus 2 by RT PCR NEGATIVE NEGATIVE Final    Comment: (NOTE) SARS-CoV-2 target nucleic acids are NOT DETECTED.  The SARS-CoV-2 RNA is generally detectable in upper respiratory specimens during the acute phase of infection. The lowest concentration of SARS-CoV-2 viral copies this assay can detect is 138 copies/mL. A negative result does not preclude SARS-Cov-2 infection and should not be used as the sole basis for treatment or other patient management decisions. A negative result may occur with  improper specimen collection/handling, submission of specimen other than nasopharyngeal swab, presence of viral mutation(s) within the areas targeted by this assay, and inadequate number of viral copies(<138 copies/mL). A negative result must be combined with clinical observations, patient history, and epidemiological information. The expected result is Negative.  Fact Sheet for Patients:  EntrepreneurPulse.com.au  Fact Sheet for Healthcare Providers:  IncredibleEmployment.be  This test is no t yet approved or cleared by the [place] FDA and  has been authorized for detection and/or diagnosis of SARS-CoV-2 by FDA under an Emergency  Use Authorization (EUA). This EUA will remain  in effect (meaning this test can be used) for the duration of the COVID-19 declaration under Section 564(b)(1) of the Act, 21 U.S.C.section 360bbb-3(b)(1), unless the authorization is terminated  or revoked sooner.       Influenza A by PCR NEGATIVE NEGATIVE Final   Influenza B by PCR NEGATIVE NEGATIVE Final    Comment: (NOTE) The Xpert Xpress SARS-CoV-2/FLU/RSV plus assay is intended as an aid in the diagnosis of influenza from Nasopharyngeal swab specimens and should not be used as a sole basis for treatment. Nasal washings and aspirates are unacceptable for Xpert Xpress SARS-CoV-2/FLU/RSV testing.  Fact Sheet for Patients: EntrepreneurPulse.com.au  Fact Sheet for Healthcare Providers: IncredibleEmployment.be  This test is not yet approved or cleared by the [place] FDA and has been authorized for detection and/or diagnosis of SARS-CoV-2 by FDA under an Emergency Use Authorization (EUA). This EUA will remain in effect (meaning this test can be used) for the duration of the COVID-19 declaration under Section 564(b)(1) of the Act, 21 U.S.C. section 360bbb-3(b)(1), unless the authorization is terminated or revoked.  Performed at [facility], [address], [place], [place] [address]      Time coordinating discharge: 35 minutes  SIGNED:   Rodena Goldmann, DO [facility] 11/04/2020, 10:06 AM  If 7PM-7AM, please contact night-coverage [email or web address]

## 2020-11-04 NOTE — Progress Notes (Signed)
Patient discharged home today, transported home by niece. Patient stated that she only uses oxygen at night and did not want her niece to have to make the drive to her home to get oxygen tank. Patient oxygen stats was in the 90-92% on room air. MD Manuella Ghazi made aware. Discharge paperwork went over with patient, verbalized understanding. Belongings sent home with patient. Patient stable upon discharge.

## 2020-11-05 LAB — LEGIONELLA PNEUMOPHILA SEROGP 1 UR AG: L. pneumophila Serogp 1 Ur Ag: NEGATIVE

## 2020-11-10 ENCOUNTER — Encounter: Payer: Self-pay | Admitting: Internal Medicine

## 2020-11-10 ENCOUNTER — Other Ambulatory Visit: Payer: Self-pay

## 2020-11-10 ENCOUNTER — Ambulatory Visit (INDEPENDENT_AMBULATORY_CARE_PROVIDER_SITE_OTHER): Payer: Medicare Other | Admitting: Internal Medicine

## 2020-11-10 VITALS — BP 128/65 | HR 79 | Temp 98.1°F | Resp 18 | Ht 64.0 in | Wt 157.0 lb

## 2020-11-10 DIAGNOSIS — J449 Chronic obstructive pulmonary disease, unspecified: Secondary | ICD-10-CM

## 2020-11-10 DIAGNOSIS — R042 Hemoptysis: Secondary | ICD-10-CM

## 2020-11-10 DIAGNOSIS — J9611 Chronic respiratory failure with hypoxia: Secondary | ICD-10-CM

## 2020-11-10 DIAGNOSIS — I2723 Pulmonary hypertension due to lung diseases and hypoxia: Secondary | ICD-10-CM

## 2020-11-10 DIAGNOSIS — Z2821 Immunization not carried out because of patient refusal: Secondary | ICD-10-CM

## 2020-11-10 DIAGNOSIS — H5440 Blindness, one eye, unspecified eye: Secondary | ICD-10-CM

## 2020-11-10 DIAGNOSIS — Z7689 Persons encountering health services in other specified circumstances: Secondary | ICD-10-CM

## 2020-11-10 DIAGNOSIS — I10 Essential (primary) hypertension: Secondary | ICD-10-CM

## 2020-11-10 DIAGNOSIS — E785 Hyperlipidemia, unspecified: Secondary | ICD-10-CM | POA: Insufficient documentation

## 2020-11-10 DIAGNOSIS — J439 Emphysema, unspecified: Secondary | ICD-10-CM | POA: Diagnosis not present

## 2020-11-10 DIAGNOSIS — R7303 Prediabetes: Secondary | ICD-10-CM | POA: Insufficient documentation

## 2020-11-10 DIAGNOSIS — Z720 Tobacco use: Secondary | ICD-10-CM

## 2020-11-10 DIAGNOSIS — E782 Mixed hyperlipidemia: Secondary | ICD-10-CM

## 2020-11-10 DIAGNOSIS — I5032 Chronic diastolic (congestive) heart failure: Secondary | ICD-10-CM

## 2020-11-10 NOTE — Assessment & Plan Note (Signed)
Takes metformin 500 mg twice daily

## 2020-11-10 NOTE — Assessment & Plan Note (Signed)
Due to COPD °Has home O2 -uses a at nighttime and as needed for dyspnea °

## 2020-11-10 NOTE — Assessment & Plan Note (Signed)
On atorvastatin 20 mg daily

## 2020-11-10 NOTE — Progress Notes (Addendum)
New Patient Office Visit  Subjective:  Patient ID: Marisa Bailey, female    DOB: 1940-03-01  Age: 80 y.o. MRN: [id number]  CC:  Chief Complaint  Patient presents with   New Patient (Initial Visit)    New pt was seeing dr Cindie Laroche pt was in [facility] for pneumonia admitted 11-02-20 released 11-04-20    HPI Marisa Bailey is an 80 year old female with PMH of HTN, HFpEF, COPD with chronic hypoxic respiratory failure and tobacco abuse who presents for establishing care.  She was recently admitted in the hospital for hemoptysis, likely due to aspiration pneumonia.  She was treated with IV antibiotics followed by oral Levaquin in the outpatient setting.  She has been feeling better since being discharged from the hospital on 09/24.  She has a history of COPD.  She uses Dulera and as needed albuterol inhaler and nebs.  She has home O2 for chronic hypoxic respiratory failure, which she uses at nighttime and as needed for dyspnea.  She denies any dyspnea or wheezing currently.  She has chronic cough, but denies any worsening of cough since being discharged from the hospital.  She has a history of HTN, for which she takes Amlodipine and olmesartan.  She denies any chest pain or palpitations currently.  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  She denies any LE swelling currently.  She continues to smoke 1 pack/day.  She lives by herself, independent for ADLs and IADLs.  She drives and does not use any walking support currently.  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  She has had 2 doses of COVID-vaccine.  She refused flu vaccine in the office today.   Past Medical History:  Diagnosis Date   Acute exacerbation of CHF (congestive heart failure) ([place]) 06/27/2017   Aspiration pneumonia ([place]) 11/03/2020   Blind right eye    Bronchitis    CHF (congestive heart failure) ([facility])    COPD (chronic obstructive pulmonary disease) ([place])    Fx upper  humerus-closed 08/17/2012   Hypertension     Past Surgical History:  Procedure Laterality Date   ENUCLEATION      Family History  Problem Relation Age of Onset   Diabetes Mother     Social History   Socioeconomic History   Marital status: Single    Spouse name: Not on file   Number of children: Not on file   Years of education: Not on file   Highest education level: Not on file  Occupational History   Not on file  Tobacco Use   Smoking status: Every Day    Packs/day: 1.00    Years: 18.00    Pack years: 18.00    Types: Cigarettes   Smokeless tobacco: Never  Vaping Use   Vaping Use: Never used  Substance and Sexual Activity   Alcohol use: No    Comment: rarely   Drug use: No   Sexual activity: Not on file  Other Topics Concern   Not on file  Social History Narrative   Retired (from [employer]). No children, never been married. Has 8 siblings (5 are deceased).   Social Determinants of Health   Financial Resource Strain: Not on file  Food Insecurity: Not on file  Transportation Needs: Not on file  Physical Activity: Not on file  Stress: Not on file  Social Connections: Not on file  Intimate Partner Violence: Not on file    ROS  Review of Systems  Constitutional:  Negative for chills and fever.  HENT:  Negative for congestion, sinus pressure, sinus pain and sore throat.   Eyes:  Negative for pain and discharge.       Right eye blindness  Respiratory:  Positive for cough. Negative for shortness of breath.   Cardiovascular:  Negative for chest pain and palpitations.  Gastrointestinal:  Negative for abdominal pain, diarrhea, nausea and vomiting.  Endocrine: Negative for polydipsia and polyuria.  Genitourinary:  Negative for dysuria and hematuria.  Musculoskeletal:  Negative for neck pain and neck stiffness.  Skin:  Negative for rash.  Neurological:  Negative for dizziness and weakness.  Psychiatric/Behavioral:  Negative for agitation and behavioral problems.     Objective:   Today's Vitals: BP 128/65 (BP Location: Left Arm, Patient Position: Sitting, Cuff Size: Normal)   Pulse 79   Temp 98.1 F (36.7 C) (Oral)   Resp 18   Ht _0  (1.626 m)   Wt 157 lb (71.2 kg)   SpO2 92%   BMI 26.95 kg/m   Physical Exam Vitals reviewed.  Constitutional:      General: She is not in acute distress.    Appearance: She is not diaphoretic.  HENT:     Head: Normocephalic and atraumatic.     Nose: Nose normal.     Mouth/Throat:     Mouth: Mucous membranes are moist.  Eyes:     General: No scleral icterus.    Comments: Right eye blindness  Cardiovascular:     Rate and Rhythm: Normal rate and regular rhythm.     Pulses: Normal pulses.     Heart sounds: Normal heart sounds. No murmur heard. Pulmonary:     Breath sounds: Normal breath sounds. No wheezing or rales.  Abdominal:     Palpations: Abdomen is soft.     Tenderness: There is no abdominal tenderness.  Musculoskeletal:     Cervical back: Neck supple. No tenderness.     Right lower leg: No edema.     Left lower leg: No edema.  Skin:    General: Skin is warm.     Findings: No rash.  Neurological:     General: No focal deficit present.     Mental Status: She is alert and oriented to person, place, and time.  Psychiatric:        Mood and Affect: Mood normal.        Behavior: Behavior normal.    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[place])    Last echo reviewed No  dyspnea or LE swelling currently Had hemoptysis recently, now resolved Referred to cardiology      Relevant Orders   Ambulatory referral to Cardiology     Respiratory   COPD (chronic obstructive pulmonary disease) ([place])    Well-controlled with Dulera and albuterol as needed Planned to see Pulmonology      Chronic respiratory failure with hypoxia ([place])  Due to COPD Has home O2 -uses a at nighttime and as needed for dyspnea        Other   Tobacco abuse    Smokes 1 pack/day  Asked about quitting: confirms that she currently smokes cigarettes Advise to quit smoking: Educated about QUITTING to reduce the risk of cancer, cardio and cerebrovascular disease. Assess willingness: Unwilling to quit at this time, but is working on cutting back. Assist with counseling and pharmacotherapy: Counseled for 5 minutes and literature provided. Arrange for follow up: Follow up in 3 months and continue to offer help.      Cough with hemoptysis    Had aspiration pneumonia recently, Hospital chart reviewed Hemoptysis resolved now Chronic cough due to COPD      Blindness of right eye    Had trauma in right eye from needle      Prediabetes    Takes metformin 500 mg twice daily      HLD (hyperlipidemia)    On atorvastatin 20 mg daily      Other Visit Diagnoses     Encounter to establish care    -  Primary Care established Previous chart reviewed History and medications reviewed with the patient    Refused influenza vaccine           Outpatient Encounter Medications as of 11/10/2020  Medication Sig   acetaminophen (TYLENOL) 325 MG tablet Take 2 tablets (650 mg total) by mouth every 6 (six) hours as needed for mild pain (or Fever >/= 101).   albuterol (PROVENTIL) (2.5 MG/3ML) 0.083% nebulizer solution Take 3 mLs (2.5 mg total) by nebulization every 6 (six) hours as needed for wheezing or shortness of breath.   amLODipine (NORVASC) 10 MG tablet Take 1 tablet (10 mg total) by  mouth daily.   aspirin EC 81 MG tablet Take 1 tablet (81 mg total) by mouth daily with breakfast.   atorvastatin (LIPITOR) 20 MG tablet Take 1 tablet (20 mg total) by mouth daily.   cetirizine (ZYRTEC) 10 MG tablet Take 10 mg by mouth daily.   Cholecalciferol (VITAMIN D3) 1.25 MG (50000 UT) TABS Take 1 tablet by mouth daily.   dextromethorphan-guaiFENesin (MUCINEX DM) 30-600 MG 12hr tablet Take 1 tablet by mouth 2 (two) times daily for 10 days.   EPINEPHrine 0.3 mg/0.3 mL IJ SOAJ injection Inject 0.3 mg into the muscle as needed for anaphylaxis.   folic acid (FOLVITE) 1 MG tablet Take 1 tablet (1 mg total) by mouth daily.   metFORMIN (GLUCOPHAGE) 500 MG tablet Take 1 tablet (500 mg total) by mouth 2 (two) times daily.   mometasone-formoterol (DULERA) 200-5 MCG/ACT AERO Inhale 2 puffs into the lungs 2 (two) times daily.   olmesartan (BENICAR) 40 MG tablet Take 1 tablet (40 mg total) by mouth daily.   PROAIR HFA 108 (90 Base) MCG/ACT inhaler Inhale 2 puffs into the lungs every 4 (four) hours as needed for wheezing or shortness of breath.   Zinc 50 MG CAPS Take 1 capsule by mouth daily.   No facility-administered encounter medications on file as of 11/10/2020.    Follow-up: Return in about 4 months (around 03/12/2021) for HTN and COPD.   [name], MD

## 2020-11-10 NOTE — Assessment & Plan Note (Signed)
Had aspiration pneumonia recently, Hospital chart reviewed Hemoptysis resolved now Chronic cough due to COPD

## 2020-11-10 NOTE — Patient Instructions (Signed)
Please continue taking medications as prescribed.  Please continue to follow low-salt diet and ambulate as tolerated.  You are being referred to cardiology.

## 2020-11-10 NOTE — Assessment & Plan Note (Signed)
Well-controlled with Dulera and albuterol as needed Planned to see Pulmonology

## 2020-11-10 NOTE — Assessment & Plan Note (Signed)
Appears euvolemic currently Not on any diuretic

## 2020-11-10 NOTE — Assessment & Plan Note (Signed)
Had trauma in right eye from needle

## 2020-11-10 NOTE — Assessment & Plan Note (Signed)
Smokes 1 pack/day ° °Asked about quitting: confirms that she currently smokes cigarettes °Advise to quit smoking: Educated about QUITTING to reduce the risk of cancer, cardio and cerebrovascular disease. °Assess willingness: Unwilling to quit at this time, but is working on cutting back. °Assist with counseling and pharmacotherapy: Counseled for 5 minutes and literature provided. °Arrange for follow up: Follow up in 3 months and continue to offer help. °

## 2020-11-10 NOTE — Assessment & Plan Note (Signed)
Last echo reviewed No dyspnea or LE swelling currently Had hemoptysis recently, now resolved Referred to cardiology

## 2020-11-10 NOTE — Assessment & Plan Note (Signed)
BP Readings from Last 1 Encounters:  11/10/20 128/65   Well-controlled with amlodipine and olmesartan Counseled for compliance with the medications Advised DASH diet and moderate exercise/walking, at least 150 mins/week

## 2020-11-30 ENCOUNTER — Other Ambulatory Visit: Payer: Self-pay

## 2020-11-30 ENCOUNTER — Telehealth: Payer: Self-pay

## 2020-11-30 ENCOUNTER — Ambulatory Visit (INDEPENDENT_AMBULATORY_CARE_PROVIDER_SITE_OTHER): Payer: Medicare Other | Admitting: *Deleted

## 2020-11-30 DIAGNOSIS — Z780 Asymptomatic menopausal state: Secondary | ICD-10-CM

## 2020-11-30 DIAGNOSIS — Z0000 Encounter for general adult medical examination without abnormal findings: Secondary | ICD-10-CM

## 2020-11-30 NOTE — Telephone Encounter (Signed)
Patient called lvm about bone density. Call back # [phone].

## 2020-11-30 NOTE — Progress Notes (Signed)
Subjective:   Marisa Bailey is a 80 y.o. female who presents for Medicare Annual (Subsequent) preventive examination. I connected with  Marisa Bailey on 11/30/20 by an audio enabled telemedicine application and verified that I am speaking with the correct person using two identifiers.   I discussed the limitations, risks, security and privacy concerns of performing an evaluation and management service by telephone and the availability of in person appointments. I also discussed with the patient that there may be a patient responsible charge related to this service. The patient expressed understanding and verbally consented to this telephonic visit.   Review of Systems           Objective:    There were no vitals filed for this visit. There is no height or weight on file to calculate BMI.  Advanced Directives 11/03/2020 11/02/2020 05/26/2019 01/10/2019 01/10/2019 08/01/2018 01/24/2018  Does Patient Have a Medical Advance Directive? No No Unable to assess, patient is non-responsive or altered mental status No No No No  Would patient like information on creating a medical advance directive? No - Patient declined No - Patient declined - No - Patient declined - No - Patient declined No - Patient declined    Current Medications (verified) Outpatient Encounter Medications as of 11/30/2020  Medication Sig   acetaminophen (TYLENOL) 325 MG tablet Take 2 tablets (650 mg total) by mouth every 6 (six) hours as needed for mild pain (or Fever >/= 101).   albuterol (PROVENTIL) (2.5 MG/3ML) 0.083% nebulizer solution Take 3 mLs (2.5 mg total) by nebulization every 6 (six) hours as needed for wheezing or shortness of breath.   amLODipine (NORVASC) 10 MG tablet Take 1 tablet (10 mg total) by mouth daily.   aspirin EC 81 MG tablet Take 1 tablet (81 mg total) by mouth daily with breakfast.   atorvastatin (LIPITOR) 20 MG tablet Take 1 tablet (20 mg total) by mouth daily.   cetirizine (ZYRTEC) 10 MG tablet  Take 10 mg by mouth daily.   Cholecalciferol (VITAMIN D3) 1.25 MG (50000 UT) TABS Take 1 tablet by mouth daily.   EPINEPHrine 0.3 mg/0.3 mL IJ SOAJ injection Inject 0.3 mg into the muscle as needed for anaphylaxis.   folic acid (FOLVITE) 1 MG tablet Take 1 tablet (1 mg total) by mouth daily.   metFORMIN (GLUCOPHAGE) 500 MG tablet Take 1 tablet (500 mg total) by mouth 2 (two) times daily.   mometasone-formoterol (DULERA) 200-5 MCG/ACT AERO Inhale 2 puffs into the lungs 2 (two) times daily.   olmesartan (BENICAR) 40 MG tablet Take 1 tablet (40 mg total) by mouth daily.   PROAIR HFA 108 (90 Base) MCG/ACT inhaler Inhale 2 puffs into the lungs every 4 (four) hours as needed for wheezing or shortness of breath.   Zinc 50 MG CAPS Take 1 capsule by mouth daily.   No facility-administered encounter medications on file as of 11/30/2020.    Allergies (verified) Penicillins   History: Past Medical History:  Diagnosis Date   Acute exacerbation of CHF (congestive heart failure) ([place]) 06/27/2017   Aspiration pneumonia ([place]) 11/03/2020   Blind right eye    Bronchitis    CHF (congestive heart failure) ([facility])    COPD (chronic obstructive pulmonary disease) ([place])    Fx upper humerus-closed 08/17/2012   Hypertension    Past Surgical History:  Procedure Laterality Date   ENUCLEATION     Family History  Problem Relation Age of Onset   Diabetes Mother    Social  History   Socioeconomic History   Marital status: Single    Spouse name: Not on file   Number of children: Not on file   Years of education: Not on file   Highest education level: Not on file  Occupational History   Not on file  Tobacco Use   Smoking status: Every Day    Packs/day: 1.00    Years: 18.00    Pack years: 18.00    Types: Cigarettes   Smokeless tobacco: Never  Vaping Use   Vaping Use: Never used  Substance and Sexual Activity   Alcohol use: No    Comment: rarely   Drug use: No   Sexual activity: Not on file  Other  Topics Concern   Not on file  Social History Narrative   Retired (from [employer]). No children, never been married. Has 8 siblings (5 are deceased).   Social Determinants of Health   Financial Resource Strain: Not on file  Food Insecurity: Not on file  Transportation Needs: Not on file  Physical Activity: Not on file  Stress: Not on file  Social Connections: Not on file    Tobacco Counseling Ready to quit: Not Answered Counseling given: Not Answered   Clinical Intake:                 Diabetic?No         Activities of Daily Living In your present state of health, do you have any difficulty performing the following activities: 11/03/2020 09/07/2020  Hearing? Y N  Vision? Y Y  Comment - reading only  Difficulty concentrating or making decisions? N Y  Comment - sometimes  Walking or climbing stairs? N N  Dressing or bathing? N N  Doing errands, shopping? N N  Some recent data might be hidden    Patient Care Team: [name], MD as PCP - General (Internal Medicine)  Indicate any recent Medical Services you may have received from other than [facility] providers in the past year (date may be approximate).     Assessment:   This is a routine wellness examination for Marisa Bailey.  Hearing/Vision screen No results found.  Dietary issues and exercise activities discussed:     Goals Addressed   None   Depression Screen PHQ 2/9 Scores 11/10/2020 09/07/2020  PHQ - 2 Score 0 0    Fall Risk Fall Risk  11/10/2020  Falls in the past year? 0  Number falls in past yr: 0  Injury with Fall? 0  Risk for fall due to : No Fall Risks  Follow up Falls evaluation completed    [place]:  Any stairs in or around the home? No  If so, are there any without handrails? No  Home free of loose throw rugs in walkways, pet beds, electrical cords, etc? Yes  Adequate lighting in your home to reduce risk of falls? Yes   ASSISTIVE DEVICES UTILIZED TO  PREVENT FALLS:  Life alert? No  Use of a cane, walker or w/c? No  Grab bars in the bathroom? No  Shower chair or bench in shower? No  Elevated toilet seat or a handicapped toilet? No   TIMED UP AND GO:  Was the test performed? No .  Length of time to ambulate 10 feet: NA sec.    Cognitive Function:        Immunizations Immunization History  Administered Date(s) Administered   [employer] Vaccination 03/21/2019, 04/21/2019    TDAP status:  Due, Education has been provided regarding the importance of this vaccine. Advised may receive this vaccine at local pharmacy or Health Dept. Aware to provide a copy of the vaccination record if obtained from local pharmacy or Health Dept. Verbalized acceptance and understanding.  Flu Vaccine status: Declined, Education has been provided regarding the importance of this vaccine but patient still declined. Advised may receive this vaccine at local pharmacy or Health Dept. Aware to provide a copy of the vaccination record if obtained from local pharmacy or Health Dept. Verbalized acceptance and understanding.  Pneumococcal vaccine status: Due, Education has been provided regarding the importance of this vaccine. Advised may receive this vaccine at local pharmacy or Health Dept. Aware to provide a copy of the vaccination record if obtained from local pharmacy or Health Dept. Verbalized acceptance and understanding.  Covid-19 vaccine status: Completed vaccines  Qualifies for Shingles Vaccine? Yes   Zostavax completed No   Shingrix Completed?: No.    Education has been provided regarding the importance of this vaccine. Patient has been advised to call insurance company to determine out of pocket expense if they have not yet received this vaccine. Advised may also receive vaccine at local pharmacy or Health Dept. Verbalized acceptance and understanding.  Screening Tests Health Maintenance  Topic Date Due   Pneumonia Vaccine 4+ Years old (1  - PCV) Never done   TETANUS/TDAP  Never done   Zoster Vaccines- Shingrix (1 of 2) Never done   DEXA SCAN  Never done   COVID-19 Vaccine (3 - Booster for Moderna series) 06/16/2019   INFLUENZA VACCINE  Never done   HPV VACCINES  Aged Out    Health Maintenance  Health Maintenance Due  Topic Date Due   Pneumonia Vaccine 65+ Years old (1 - PCV) Never done   TETANUS/TDAP  Never done   Zoster Vaccines- Shingrix (1 of 2) Never done   DEXA SCAN  Never done   COVID-19 Vaccine (3 - Booster for Moderna series) 06/16/2019   INFLUENZA VACCINE  Never done    Colorectal cancer screening: No longer required.   Mammogram status: No longer required due to age.  Bone Density status: Ordered 11-30-20. Pt provided with contact info and advised to call to schedule appt.  Lung Cancer Screening: (Low Dose CT Chest recommended if Age 8-80 years, 30 pack-year currently smoking OR have quit w/in 15years.) does qualify.   Lung Cancer Screening Referral: pt declined  Additional Screening:  Hepatitis C Screening: does not qualify; Completed   Vision Screening: Recommended annual ophthalmology exams for early detection of glaucoma and other disorders of the eye. Is the patient up to date with their annual eye exam?  No  Who is the provider or what is the name of the office in which the patient attends annual eye exams? [facility] If pt is not established with a provider, would they like to be referred to a provider to establish care? No .   Dental Screening: Recommended annual dental exams for proper oral hygiene  Community Resource Referral / Chronic Care Management: CRR required this visit?  No   CCM required this visit?  No      Plan:     I have personally reviewed and noted the following in the patient's chart:   Medical and social history Use of alcohol, tobacco or illicit drugs  Current medications and supplements including opioid prescriptions.  Functional ability and  status Nutritional status Physical activity Advanced directives List of other  physicians Hospitalizations, surgeries, and ER visits in previous 12 months Vitals Screenings to include cognitive, depression, and falls Referrals and appointments  In addition, I have reviewed and discussed with patient certain preventive protocols, quality metrics, and best practice recommendations. A written personalized care plan for preventive services as well as general preventive health recommendations were provided to patient.     Shelda Altes, CMA   11/30/2020   Nurse Notes: This was telehealth visit. The patient was at home and the provider was in the office and was Ihor Dow, MD.

## 2020-11-30 NOTE — Telephone Encounter (Signed)
Pt wanted to know what medication to stop taking advised with verbal understanding

## 2020-11-30 NOTE — Patient Instructions (Signed)
Marisa Bailey , Thank you for taking time to come for your Medicare Wellness Visit. I appreciate your ongoing commitment to your health goals. Please review the following plan we discussed and let me know if I can assist you in the future.   Screening recommendations/referrals: Bone Density: ordered Recommended yearly ophthalmology/optometry visit for glaucoma screening and checkup Recommended yearly dental visit for hygiene and checkup  Vaccinations: Influenza vaccine: patient declined  Pneumococcal vaccine: due now  Tdap vaccine: due now  Shingles vaccine: due now    Advanced directives: patient declined information   Conditions/risks identified: hypertension  Next appointment: 1 year    Preventive Care 16 Years and Older, Female Preventive care refers to lifestyle choices and visits with your health care provider that can promote health and wellness. What does preventive care include? A yearly physical exam. This is also called an annual well check. Dental exams once or twice a year. Routine eye exams. Ask your health care provider how often you should have your eyes checked. Personal lifestyle choices, including: Daily care of your teeth and gums. Regular physical activity. Eating a healthy diet. Avoiding tobacco and drug use. Limiting alcohol use. Practicing safe sex. Taking low-dose aspirin every day. Taking vitamin and mineral supplements as recommended by your health care provider. What happens during an annual well check? The services and screenings done by your health care provider during your annual well check will depend on your age, overall health, lifestyle risk factors, and family history of disease. Counseling  Your health care provider may ask you questions about your: Alcohol use. Tobacco use. Drug use. Emotional well-being. Home and relationship well-being. Sexual activity. Eating habits. History of falls. Memory and ability to understand  (cognition). Work and work Statistician. Reproductive health. Screening  You may have the following tests or measurements: Height, weight, and BMI. Blood pressure. Lipid and cholesterol levels. These may be checked every 5 years, or more frequently if you are over 97 years old. Skin check. Lung cancer screening. You may have this screening every year starting at age 27 if you have a 30-pack-year history of smoking and currently smoke or have quit within the past 15 years. Fecal occult blood test (FOBT) of the stool. You may have this test every year starting at age 53. Flexible sigmoidoscopy or colonoscopy. You may have a sigmoidoscopy every 5 years or a colonoscopy every 10 years starting at age 42. Hepatitis C blood test. Hepatitis B blood test. Sexually transmitted disease (STD) testing. Diabetes screening. This is done by checking your blood sugar (glucose) after you have not eaten for a while (fasting). You may have this done every 1-3 years. Bone density scan. This is done to screen for osteoporosis. You may have this done starting at age 77. Mammogram. This may be done every 1-2 years. Talk to your health care provider about how often you should have regular mammograms. Talk with your health care provider about your test results, treatment options, and if necessary, the need for more tests. Vaccines  Your health care provider may recommend certain vaccines, such as: Influenza vaccine. This is recommended every year. Tetanus, diphtheria, and acellular pertussis (Tdap, Td) vaccine. You may need a Td booster every 10 years. Zoster vaccine. You may need this after age 73. Pneumococcal 13-valent conjugate (PCV13) vaccine. One dose is recommended after age 40. Pneumococcal polysaccharide (PPSV23) vaccine. One dose is recommended after age 20. Talk to your health care provider about which screenings and vaccines you need and how  often you need them. This information is not intended to  replace advice given to you by your health care provider. Make sure you discuss any questions you have with your health care provider. Document Released: 02/24/2015 Document Revised: 10/18/2015 Document Reviewed: 11/29/2014 Elsevier Interactive Patient Education  2017 [employer] Prevention in the Home Falls can cause injuries. They can happen to people of all ages. There are many things you can do to make your home safe and to help prevent falls. What can I do on the outside of my home? Regularly fix the edges of walkways and driveways and fix any cracks. Remove anything that might make you trip as you walk through a door, such as a raised step or threshold. Trim any bushes or trees on the path to your home. Use bright outdoor lighting. Clear any walking paths of anything that might make someone trip, such as rocks or tools. Regularly check to see if handrails are loose or broken. Make sure that both sides of any steps have handrails. Any raised decks and porches should have guardrails on the edges. Have any leaves, snow, or ice cleared regularly. Use sand or salt on walking paths during winter. Clean up any spills in your garage right away. This includes oil or grease spills. What can I do in the bathroom? Use night lights. Install grab bars by the toilet and in the tub and shower. Do not use towel bars as grab bars. Use non-skid mats or decals in the tub or shower. If you need to sit down in the shower, use a plastic, non-slip stool. Keep the floor dry. Clean up any water that spills on the floor as soon as it happens. Remove soap buildup in the tub or shower regularly. Attach bath mats securely with double-sided non-slip rug tape. Do not have throw rugs and other things on the floor that can make you trip. What can I do in the bedroom? Use night lights. Make sure that you have a light by your bed that is easy to reach. Do not use any sheets or blankets that are too big for  your bed. They should not hang down onto the floor. Have a firm chair that has side arms. You can use this for support while you get dressed. Do not have throw rugs and other things on the floor that can make you trip. What can I do in the kitchen? Clean up any spills right away. Avoid walking on wet floors. Keep items that you use a lot in easy-to-reach places. If you need to reach something above you, use a strong step stool that has a grab bar. Keep electrical cords out of the way. Do not use floor polish or wax that makes floors slippery. If you must use wax, use non-skid floor wax. Do not have throw rugs and other things on the floor that can make you trip. What can I do with my stairs? Do not leave any items on the stairs. Make sure that there are handrails on both sides of the stairs and use them. Fix handrails that are broken or loose. Make sure that handrails are as long as the stairways. Check any carpeting to make sure that it is firmly attached to the stairs. Fix any carpet that is loose or worn. Avoid having throw rugs at the top or bottom of the stairs. If you do have throw rugs, attach them to the floor with carpet tape. Make sure that you have a  light switch at the top of the stairs and the bottom of the stairs. If you do not have them, ask someone to add them for you. What else can I do to help prevent falls? Wear shoes that: Do not have high heels. Have rubber bottoms. Are comfortable and fit you well. Are closed at the toe. Do not wear sandals. If you use a stepladder: Make sure that it is fully opened. Do not climb a closed stepladder. Make sure that both sides of the stepladder are locked into place. Ask someone to hold it for you, if possible. Clearly mark and make sure that you can see: Any grab bars or handrails. First and last steps. Where the edge of each step is. Use tools that help you move around (mobility aids) if they are needed. These  include: Canes. Walkers. Scooters. Crutches. Turn on the lights when you go into a dark area. Replace any light bulbs as soon as they burn out. Set up your furniture so you have a clear path. Avoid moving your furniture around. If any of your floors are uneven, fix them. If there are any pets around you, be aware of where they are. Review your medicines with your doctor. Some medicines can make you feel dizzy. This can increase your chance of falling. Ask your doctor what other things that you can do to help prevent falls. This information is not intended to replace advice given to you by your health care provider. Make sure you discuss any questions you have with your health care provider. Document Released: 11/24/2008 Document Revised: 07/06/2015 Document Reviewed: 03/04/2014 Elsevier Interactive Patient Education  2017 [employer].

## 2020-12-06 ENCOUNTER — Ambulatory Visit (HOSPITAL_COMMUNITY)
Admission: RE | Admit: 2020-12-06 | Discharge: 2020-12-06 | Disposition: A | Discharge status: Home / Self Care | Payer: Medicare Other | Source: Ambulatory Visit | Attending: Internal Medicine | Admitting: Internal Medicine

## 2020-12-06 ENCOUNTER — Other Ambulatory Visit: Payer: Self-pay

## 2020-12-06 DIAGNOSIS — Z780 Asymptomatic menopausal state: Secondary | ICD-10-CM | POA: Insufficient documentation

## 2020-12-07 ENCOUNTER — Other Ambulatory Visit: Payer: Self-pay | Admitting: Internal Medicine

## 2020-12-07 DIAGNOSIS — M8589 Other specified disorders of bone density and structure, multiple sites: Secondary | ICD-10-CM

## 2020-12-07 MED ORDER — CALTRATE 600+D PLUS MINERALS 600-800 MG-UNIT PO TABS: 1.0000 | ORAL_TABLET | Freq: Two times a day (BID) | ORAL | 1 refills | Status: DC

## 2020-12-28 ENCOUNTER — Other Ambulatory Visit: Payer: Self-pay

## 2020-12-28 ENCOUNTER — Ambulatory Visit (INDEPENDENT_AMBULATORY_CARE_PROVIDER_SITE_OTHER): Payer: Medicare Other | Admitting: Pulmonary Disease

## 2020-12-28 ENCOUNTER — Encounter: Payer: Self-pay | Admitting: Pulmonary Disease

## 2020-12-28 ENCOUNTER — Institutional Professional Consult (permissible substitution): Payer: Medicare Other | Admitting: Pulmonary Disease

## 2020-12-28 VITALS — BP 130/66 | HR 71 | Temp 98.0°F | Ht 64.0 in | Wt 155.1 lb

## 2020-12-28 DIAGNOSIS — J449 Chronic obstructive pulmonary disease, unspecified: Secondary | ICD-10-CM

## 2020-12-28 DIAGNOSIS — J479 Bronchiectasis, uncomplicated: Secondary | ICD-10-CM | POA: Diagnosis not present

## 2020-12-28 DIAGNOSIS — Z8701 Personal history of pneumonia (recurrent): Secondary | ICD-10-CM

## 2020-12-28 NOTE — Patient Instructions (Signed)
Chest xray at [facility]  Follow up in 4 months

## 2020-12-28 NOTE — Progress Notes (Signed)
[facility]  Chief Complaint  Patient presents with   Consult    Patient aspirated in Aug. 2022 while eating and started coughing up blood. No similar events have reoccurred since. States she uses 2LO2 as needed. Unsure of DME     Past Surgical History:  She  has a past surgical history that includes Enucleation.  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 female smoker with hemoptysis.      Subjective:   She was in hospital from 9/22 to 9/24 for aspiration pneumonia.  Treated with levaquin.  She has been on 2 liters oxygen.  During this episode she was noted to have hemoptysis.  She was referred to pulmonary to determine if bronchoscopy with airway inspection was needed.  She has not coughed up any blood since being in hospital.  She does still get intermittent cough, but sputum is clear.  Not having wheeze, chest pain, or chest tightness.  Uses oxygen when she is resting.  Not having fever, sweats, weight loss, or abdominal pain.  She hasn't noticed trouble with her swallowing or with reflux.    She continues to smoke 1 ppd.  She has tried quitting, but feels she is too old and not interested in quitting at present.  No history of tuberculosis.  She was never told she had pneumonia before.  She has been using albuterol a few times per week.  She doesn't have any other inhalers at home.  Physical Exam:   Appearance - well kempt  ENMT - no sinus tenderness, no oral exudate, no LAN, Mallampati 4 airway, no stridor, wears dentures  Respiratory - equal breath sounds bilaterally, no wheezing or rales  CV - s1s2 regular  rate and rhythm, no murmurs  Ext - no clubbing, no edema  Skin - no rashes  Psych - normal mood and affect   Chest Imaging:  CT angio chest 01/10/19 >> b/l lower lobe BTX CT angio chest 11/02/20 >> atherosclerosis, patchy infiltrate and consolidation in bases  Cardiac Tests:  Echo 05/26/19 >> EF 50%, grade 1 DD, mild LVH, mod RV dysfx, RVSP 36.1 mmHg  Social History:  She  reports that she has been smoking cigarettes. She has a 18.00 pack-year smoking history. She has never used smokeless tobacco. She reports that she does not drink alcohol and does not use drugs.  Family History:  Her family history includes Diabetes in her mother.    Discussion:  She was in hospital in September 2022 for aspiration pneumonitis with hemoptysis.  In review of her chest imaging she has regional bronchiectasis involving the lower lobes.  This would suggest episodes of reflux.  She continues to smoke cigarettes and carries a diagnosis of COPD.  She currently has minimal respiratory symptoms, no longer having hemoptysis, denies current symptoms of reflux or dysphagia, and not needing inhaler therapy much.  Assessment/Plan:   Hemoptysis with aspiration pneumonitis in September 2022. - repeat chest xray today - if she has persistent infiltrates, then might need to consider bronchoscopy with airway inspection -  if she has recurrent episode of aspiration pneumonitis, then might need speech therapy and gastroenterology assessments  Tobacco abuse with reported history of COPD. - prn albuterol - reviewed options to help with smoking cessation; she is not interested in quitting at this time - don't think there is any utility in having her do PFT at this time, since results wouldn't change current treatment options  Bronchiectasis. - discussed importance of bronchial hygiene  Time Spent Involved in Patient Care on Day of Examination:  48 minutes  Follow up:   Patient Instructions  Chest xray at [facility]  Follow up in 4 months  Medication List:   Allergies as of 12/28/2020       Reactions   Penicillins Rash   Has patient had a PCN reaction causing immediate rash, facial/tongue/throat swelling, SOB or lightheadedness with hypotension: {Yes/No:[id number] Has patient had a PCN reaction causing severe rash involving mucus membranes or skin necrosis: Yes Has patient had a PCN reaction that required hospitalization No Has patient had a PCN reaction occurring within the last 10 years: No If all of the above answers are "NO", then may proceed with Cephalosporin use. **Has tolerated keflex        Medication List        Accurate as of December 28, 2020  9:40 AM. If you have any questions, ask your nurse or doctor.          STOP taking these medications    acetaminophen 325 MG tablet Commonly known as: TYLENOL Stopped by: Chesley Mires, MD   cetirizine 10 MG tablet Commonly known as: ZYRTEC Stopped by: Chesley Mires, MD   mometasone-formoterol 200-5 MCG/ACT Aero Commonly known as: DULERA Stopped by: Chesley Mires, MD       TAKE these medications    albuterol (2.5 MG/3ML) 0.083% nebulizer solution Commonly known as: PROVENTIL Take 3 mLs (2.5 mg total) by nebulization every 6 (six) hours as needed for wheezing or shortness of breath.   ProAir HFA 108 (90 Base) MCG/ACT inhaler Generic drug: albuterol Inhale 2 puffs into the lungs every 4 (four) hours as needed for wheezing or shortness of breath.   amLODipine 10 MG tablet Commonly known as: NORVASC Take 1 tablet (10 mg total) by mouth daily.   aspirin EC 81 MG tablet Take 1 tablet (81 mg total) by mouth daily with breakfast.   atorvastatin 20 MG tablet Commonly known as: LIPITOR Take 1 tablet (20 mg total) by mouth daily.   Caltrate 600+D Plus Minerals 600-800 MG-UNIT Tabs Take 1 tablet by mouth 2 (two) times daily.   EPINEPHrine 0.3 mg/0.3 mL Soaj injection Commonly known as: EPI-PEN Inject 0.3 mg  into the muscle as needed for anaphylaxis.   folic acid 1 MG tablet Commonly known as: FOLVITE Take 1 tablet (1 mg total) by mouth daily.   metFORMIN 500 MG tablet Commonly known as: GLUCOPHAGE Take 1 tablet (500 mg total) by mouth 2 (two) times daily.   olmesartan 40 MG tablet Commonly known as: BENICAR Take 1 tablet (40 mg total) by mouth daily.   Zinc 50 MG Caps Take 1 capsule by mouth daily.        Signature:  Chesley Mires, MD [place] Pager - [phone] 12/28/2020, 9:40 AM

## 2021-01-14 NOTE — Progress Notes (Signed)
Cardiology Office Note:    Date:  01/18/2021   ID:  Marisa Bailey, DOB [birth year], MRN [id number]  PCP:  [name], MD  Cardiologist:  Donato Heinz, MD  Electrophysiologist:  None   Referring MD: [name], MD   Chief Complaint  Patient presents with   Congestive Heart Failure    History of Present Illness:    Marisa Bailey is a 80 y.o. female with a hx of chronic diastolic heart failure, COPD, hypertension, tobacco use who is referred by Dr. Posey Pronto for evaluation of heart failure.  Echocardiogram 05/26/2019 showed EF 50%, global hypokinesis, mild LVH, grade 1 diastolic dysfunction, interventricular septum flattening in systole/diastole consistent with RV pressure and volume overload, moderate RV systolic dysfunction, moderate RV enlargement, RVSP 36 mmHg, no significant valvular disease.  Denies any chest pain, dyspnea, lightheadedness, syncope, lower extremity edema, or palpitations.  Walks ~3 times per week for 30 minutes.  Smokes 1ppd.  Father died of MI at 33.   Past Medical History:  Diagnosis Date   Aspiration pneumonia ([place]) 11/03/2020   Blind right eye    CHF (congestive heart failure) (HCC)    COPD (chronic obstructive pulmonary disease) ([place])    Fx upper humerus-closed 08/17/2012   Hypertension     Past Surgical History:  Procedure Laterality Date   ENUCLEATION      Current Medications: Current Meds  Medication Sig   albuterol (PROVENTIL) (2.5 MG/3ML) 0.083% nebulizer solution Take 3 mLs (2.5 mg total) by nebulization every 6 (six) hours as needed for wheezing or shortness of breath.   amLODipine (NORVASC) 10 MG tablet Take 1 tablet (10 mg total) by mouth daily.   aspirin EC 81 MG tablet Take 1 tablet (81 mg total) by mouth daily with breakfast.   atorvastatin (LIPITOR) 20 MG tablet Take 1 tablet (20 mg total) by mouth daily.   Calcium Carbonate-Vit D-Min (CALTRATE 600+D PLUS MINERALS) 600-800 MG-UNIT TABS Take 1 tablet by mouth 2 (two)  times daily.   EPINEPHrine 0.3 mg/0.3 mL IJ SOAJ injection Inject 0.3 mg into the muscle as needed for anaphylaxis.   folic acid (FOLVITE) 1 MG tablet Take 1 tablet (1 mg total) by mouth daily.   metFORMIN (GLUCOPHAGE) 500 MG tablet Take 1 tablet (500 mg total) by mouth 2 (two) times daily.   olmesartan (BENICAR) 40 MG tablet Take 1 tablet (40 mg total) by mouth daily.   Zinc 50 MG CAPS Take 1 capsule by mouth daily.     Allergies:   Penicillins   Social History   Socioeconomic History   Marital status: Single    Spouse name: Not on file   Number of children: Not on file   Years of education: Not on file   Highest education level: Not on file  Occupational History   Not on file  Tobacco Use   Smoking status: Every Day    Packs/day: 1.00    Years: 18.00    Pack years: 18.00    Types: Cigarettes   Smokeless tobacco: Never  Vaping Use   Vaping Use: Never used  Substance and Sexual Activity   Alcohol use: No    Comment: rarely   Drug use: No   Sexual activity: Not on file  Other Topics Concern   Not on file  Social History Narrative   Retired (from [employer]). No children, never been married. Has 8 siblings (5 are deceased).   Social Determinants of Health   Financial Resource Strain:  Low Risk    Difficulty of Paying Living Expenses: Not hard at all  Food Insecurity: No Food Insecurity   Worried About Running Out of Food in the Last Year: Never true   Ran Out of Food in the Last Year: Never true  Transportation Needs: No Transportation Needs   Lack of Transportation (Medical): No   Lack of Transportation (Non-Medical): No  Physical Activity: Sufficiently Active   Days of Exercise per Week: 7 days   Minutes of Exercise per Session: 30 min  Stress: No Stress Concern Present   Feeling of Stress : Not at all  Social Connections: Moderately Isolated   Frequency of Communication with Friends and Family: More than three times a week   Frequency of Social Gatherings with  Friends and Family: More than three times a week   Attends Religious Services: 1 to 4 times per year   Active Member of [employer] or Organizations: No   Attends Archivist Meetings: Never   Marital Status: Never married     Family History: The patient's family history includes Diabetes in her mother.  ROS:   Please see the history of present illness.     All other systems reviewed and are negative.  EKGs/Labs/Other Studies Reviewed:    The following studies were reviewed today:   EKG:  EKG is not ordered today.  The ekg ordered 11/02/2020 demonstrates sinus rhythm, rate 80, poor R wave progression  Recent Labs: 09/07/2020: TSH 1.30 11/03/2020: ALT 12 11/04/2020: BUN 13; Creatinine, Ser 0.75; Hemoglobin 10.9; Magnesium 1.9; Platelets 236; Potassium 4.2; Sodium 138  Recent Lipid Panel    Component Value Date/Time   CHOL 126 09/07/2020 1106   TRIG 138 09/07/2020 1106   HDL 46 (L) 09/07/2020 1106   CHOLHDL 2.7 09/07/2020 1106   VLDL 18 05/28/2019 0614   LDLCALC 58 09/07/2020 1106    Physical Exam:    VS:  BP 124/60   Pulse 70   Ht _0  (1.626 m)   Wt 156 lb 6.4 oz (70.9 kg)   SpO2 92%   BMI 26.85 kg/m     Wt Readings from Last 3 Encounters:  01/18/21 156 lb 6.4 oz (70.9 kg)  12/28/20 155 lb 1.3 oz (70.3 kg)  11/10/20 157 lb (71.2 kg)     GEN:  Well nourished, well developed in no acute distress HEENT: Normal NECK: No JVD; No carotid bruits LYMPHATICS: No lymphadenopathy CARDIAC: RRR, no murmurs, rubs, gallops RESPIRATORY:  Clear to auscultation without rales, wheezing or rhonchi  ABDOMEN: Soft, non-tender, non-distended MUSCULOSKELETAL:  No edema; No deformity  SKIN: Warm and dry NEUROLOGIC:  Alert and oriented x 3 PSYCHIATRIC:  Normal affect   ASSESSMENT:    1. Chronic diastolic heart failure (HCC)   2. Pulmonary HTN ([place])   3. Essential hypertension   4. Hyperlipidemia, unspecified hyperlipidemia type   5. Tobacco use    PLAN:    Chronic  diastolic heart failure: Echocardiogram 05/26/2019 showed EF 50%, global hypokinesis, mild LVH, grade 1 diastolic dysfunction, interventricular septum flattening in systole/diastole consistent with RV pressure and volume overload, moderate RV systolic dysfunction, moderate RV enlargement, RVSP 36 mmHg, no significant valvular disease. -Appears euvolemic, not on diuretic  Pulmonary hypertension: Mildly elevated RVSP (36 mmHg) on echo 05/2019.  We will repeat echocardiogram.  Likely class III due to lung disease  COPD: Follows with pulmonology  Hypertension: On amlodipine 10 mg daily, olmesartan 40 mg daily.  Appears controlled  Hyperlipidemia: On atorvastatin 20  mg daily. LDL 58 on 09/07/20.   Tobacco use: Counseled on the risk of tobacco use and cessation strongly encouraged  RTC in 6 months  Medication Adjustments/Labs and Tests Ordered: Current medicines are reviewed at length with the patient today.  Concerns regarding medicines are outlined above.  Orders Placed This Encounter  Procedures   ECHOCARDIOGRAM COMPLETE   No orders of the defined types were placed in this encounter.   Patient Instructions  Medication Instructions:  Your physician recommends that you continue on your current medications as directed. Please refer to the Current Medication list given to you today.   Labwork: None   Testing/Procedures: Your physician has requested that you have an echocardiogram. Echocardiography is a painless test that uses sound waves to create images of your heart. It provides your doctor with information about the size and shape of your heart and how well your heart's chambers and valves are working. This procedure takes approximately one hour. There are no restrictions for this procedure.   Follow-Up: 6 months  Any Other Special Instructions Will Be Listed Below (If Applicable).  If you need a refill on your cardiac medications before your next appointment, please call your  pharmacy.   Signed, Donato Heinz, MD  01/18/2021 11:10 PM    [facility]

## 2021-01-18 ENCOUNTER — Encounter: Payer: Self-pay | Admitting: Cardiology

## 2021-01-18 ENCOUNTER — Ambulatory Visit (INDEPENDENT_AMBULATORY_CARE_PROVIDER_SITE_OTHER): Payer: Medicare Other | Admitting: Cardiology

## 2021-01-18 ENCOUNTER — Ambulatory Visit (HOSPITAL_COMMUNITY)
Admission: RE | Admit: 2021-01-18 | Discharge: 2021-01-18 | Disposition: A | Discharge status: Home / Self Care | Payer: Medicare Other | Source: Ambulatory Visit | Attending: Pulmonary Disease | Admitting: Pulmonary Disease

## 2021-01-18 ENCOUNTER — Other Ambulatory Visit: Payer: Self-pay

## 2021-01-18 VITALS — BP 124/60 | HR 70 | Ht 64.0 in | Wt 156.4 lb

## 2021-01-18 DIAGNOSIS — J449 Chronic obstructive pulmonary disease, unspecified: Secondary | ICD-10-CM | POA: Diagnosis present

## 2021-01-18 DIAGNOSIS — J479 Bronchiectasis, uncomplicated: Secondary | ICD-10-CM | POA: Insufficient documentation

## 2021-01-18 DIAGNOSIS — I10 Essential (primary) hypertension: Secondary | ICD-10-CM

## 2021-01-18 DIAGNOSIS — I5032 Chronic diastolic (congestive) heart failure: Secondary | ICD-10-CM | POA: Diagnosis not present

## 2021-01-18 DIAGNOSIS — Z8701 Personal history of pneumonia (recurrent): Secondary | ICD-10-CM | POA: Diagnosis present

## 2021-01-18 DIAGNOSIS — I272 Pulmonary hypertension, unspecified: Secondary | ICD-10-CM

## 2021-01-18 DIAGNOSIS — E785 Hyperlipidemia, unspecified: Secondary | ICD-10-CM | POA: Diagnosis not present

## 2021-01-18 DIAGNOSIS — Z720 Tobacco use: Secondary | ICD-10-CM

## 2021-01-18 NOTE — Patient Instructions (Signed)
Medication Instructions:  Your physician recommends that you continue on your current medications as directed. Please refer to the Current Medication list given to you today.   Labwork: None   Testing/Procedures: Your physician has requested that you have an echocardiogram. Echocardiography is a painless test that uses sound waves to create images of your heart. It provides your doctor with information about the size and shape of your heart and how well your heart's chambers and valves are working. This procedure takes approximately one hour. There are no restrictions for this procedure.   Follow-Up: 6 months  Any Other Special Instructions Will Be Listed Below (If Applicable).  If you need a refill on your cardiac medications before your next appointment, please call your pharmacy.

## 2021-01-22 ENCOUNTER — Telehealth: Payer: Self-pay

## 2021-01-22 ENCOUNTER — Other Ambulatory Visit: Payer: Self-pay

## 2021-01-22 DIAGNOSIS — E119 Type 2 diabetes mellitus without complications: Secondary | ICD-10-CM

## 2021-01-22 DIAGNOSIS — E785 Hyperlipidemia, unspecified: Secondary | ICD-10-CM

## 2021-01-22 MED ORDER — ATORVASTATIN CALCIUM 20 MG PO TABS: 20.0000 mg | ORAL_TABLET | Freq: Every day | ORAL | 0 refills | Status: DC

## 2021-01-22 MED ORDER — METFORMIN HCL 500 MG PO TABS: 500.0000 mg | ORAL_TABLET | Freq: Two times a day (BID) | ORAL | 0 refills | Status: DC

## 2021-01-22 NOTE — Telephone Encounter (Signed)
Refills sent to pharmacy

## 2021-01-22 NOTE — Telephone Encounter (Signed)
Needs refill  metformin 500 mg and atorbastatin 20 mg [facility] [place]

## 2021-02-23 ENCOUNTER — Other Ambulatory Visit (INDEPENDENT_AMBULATORY_CARE_PROVIDER_SITE_OTHER): Payer: Self-pay | Admitting: Nurse Practitioner

## 2021-02-23 DIAGNOSIS — I10 Essential (primary) hypertension: Secondary | ICD-10-CM

## 2021-02-26 ENCOUNTER — Other Ambulatory Visit: Payer: Self-pay | Admitting: *Deleted

## 2021-02-26 DIAGNOSIS — I10 Essential (primary) hypertension: Secondary | ICD-10-CM

## 2021-02-26 MED ORDER — OLMESARTAN MEDOXOMIL 40 MG PO TABS: 40.0000 mg | ORAL_TABLET | Freq: Every day | ORAL | 0 refills | Status: DC

## 2021-02-28 ENCOUNTER — Ambulatory Visit (HOSPITAL_COMMUNITY)
Admission: RE | Admit: 2021-02-28 | Discharge: 2021-02-28 | Disposition: A | Discharge status: Home / Self Care | Payer: Medicare Other | Source: Ambulatory Visit | Attending: Cardiology | Admitting: Cardiology

## 2021-02-28 DIAGNOSIS — I272 Pulmonary hypertension, unspecified: Secondary | ICD-10-CM | POA: Insufficient documentation

## 2021-02-28 DIAGNOSIS — I10 Essential (primary) hypertension: Secondary | ICD-10-CM | POA: Diagnosis not present

## 2021-02-28 LAB — ECHOCARDIOGRAM COMPLETE
Area-P 1/2: 3.21 cm2
S' Lateral: 3.2 cm

## 2021-02-28 NOTE — Progress Notes (Signed)
*  PRELIMINARY RESULTS* Echocardiogram 2D Echocardiogram has been performed.  Marisa Bailey 02/28/2021, 11:55 AM

## 2021-03-01 ENCOUNTER — Encounter: Payer: Self-pay | Admitting: *Deleted

## 2021-03-09 ENCOUNTER — Other Ambulatory Visit: Payer: Self-pay | Admitting: *Deleted

## 2021-03-09 DIAGNOSIS — E785 Hyperlipidemia, unspecified: Secondary | ICD-10-CM

## 2021-03-09 MED ORDER — ATORVASTATIN CALCIUM 20 MG PO TABS: 20.0000 mg | ORAL_TABLET | Freq: Every day | ORAL | 0 refills | Status: DC

## 2021-03-12 ENCOUNTER — Ambulatory Visit (INDEPENDENT_AMBULATORY_CARE_PROVIDER_SITE_OTHER): Payer: Medicare Other | Admitting: Internal Medicine

## 2021-03-12 ENCOUNTER — Other Ambulatory Visit: Payer: Self-pay

## 2021-03-12 ENCOUNTER — Encounter: Payer: Self-pay | Admitting: Internal Medicine

## 2021-03-12 VITALS — BP 118/62 | HR 73 | Resp 18 | Ht 64.0 in | Wt 156.1 lb

## 2021-03-12 DIAGNOSIS — I2723 Pulmonary hypertension due to lung diseases and hypoxia: Secondary | ICD-10-CM

## 2021-03-12 DIAGNOSIS — J439 Emphysema, unspecified: Secondary | ICD-10-CM

## 2021-03-12 DIAGNOSIS — J9611 Chronic respiratory failure with hypoxia: Secondary | ICD-10-CM

## 2021-03-12 DIAGNOSIS — F17210 Nicotine dependence, cigarettes, uncomplicated: Secondary | ICD-10-CM

## 2021-03-12 DIAGNOSIS — Z720 Tobacco use: Secondary | ICD-10-CM

## 2021-03-12 DIAGNOSIS — I10 Essential (primary) hypertension: Secondary | ICD-10-CM | POA: Diagnosis not present

## 2021-03-12 DIAGNOSIS — J0110 Acute frontal sinusitis, unspecified: Secondary | ICD-10-CM

## 2021-03-12 DIAGNOSIS — R7303 Prediabetes: Secondary | ICD-10-CM

## 2021-03-12 DIAGNOSIS — E782 Mixed hyperlipidemia: Secondary | ICD-10-CM

## 2021-03-12 DIAGNOSIS — I5032 Chronic diastolic (congestive) heart failure: Secondary | ICD-10-CM | POA: Diagnosis not present

## 2021-03-12 DIAGNOSIS — J449 Chronic obstructive pulmonary disease, unspecified: Secondary | ICD-10-CM

## 2021-03-12 DIAGNOSIS — Z2821 Immunization not carried out because of patient refusal: Secondary | ICD-10-CM

## 2021-03-12 MED ORDER — NOREL AD 4-10-325 MG PO TABS: 1.0000 | ORAL_TABLET | Freq: Three times a day (TID) | ORAL | 0 refills | Status: DC | PRN

## 2021-03-12 NOTE — Assessment & Plan Note (Signed)
Well-controlled with Dulera and albuterol as needed Followed by pulmonology

## 2021-03-12 NOTE — Patient Instructions (Signed)
Please continue taking medications as prescribed.  Please continue to follow low salt diet and ambulate as tolerated.  Please contact us if your symptoms don't improve in next 3-4 days.

## 2021-03-12 NOTE — Assessment & Plan Note (Signed)
Smokes 1 pack/day  Asked about quitting: confirms that she currently smokes cigarettes Advise to quit smoking: Educated about QUITTING to reduce the risk of cancer, cardio and cerebrovascular disease. Assess willingness: Unwilling to quit at this time, but is working on cutting back. Assist with counseling and pharmacotherapy: Counseled for 5 minutes and literature provided. Arrange for follow up: Follow up in 3 months and continue to offer help.

## 2021-03-12 NOTE — Assessment & Plan Note (Signed)
Appears euvolemic currently Not on any diuretic

## 2021-03-12 NOTE — Progress Notes (Signed)
Established Patient Office Visit  Subjective:  Patient ID: Marisa Bailey, female    DOB: Jun 13, 1940  Age: 81 y.o. MRN: [id number]  CC:  Chief Complaint  Patient presents with   Follow-up    4 month follow up HTN and COPD pt has been having trouble out of sinus has pressure and cough since 03-09-21    HPI Marisa Bailey is a 81 y.o. female with past medical history of HTN, HFpEF, COPD with chronic hypoxic respiratory failure and tobacco abuse who presents for f/u of her chronic medical conditions.  HTN: She takes Amlodipine and olmesartan.  She denies any chest pain or palpitations currently.  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  She denies any LE swelling currently.  COPD with chronic hypoxic respiratory failure: She has seen Dr. Halford Chessman for COPD.  She currently uses albuterol nebulizer as needed for dyspnea or wheezing.  She still smokes about a pack per day and does not want to quit anytime soon.  She has home O2 for chronic hypoxic respiratory failure, which she uses at nighttime and as needed for dyspnea.  She denies any dyspnea or wheezing currently.   She complains of nasal congestion and sinus pressure related headache for last 3 to 4 days.  She denies any fever, chills or recent worsening of cough.    Past Medical History:  Diagnosis Date   Aspiration pneumonia ([place]) 11/03/2020   Blind right eye    CHF (congestive heart failure) (HCC)    COPD (chronic obstructive pulmonary disease) (HCC)    Fx upper humerus-closed 08/17/2012   Hypertension     Past Surgical History:  Procedure Laterality Date   ENUCLEATION      Family History  Problem Relation Age of Onset   Diabetes Mother     Social History   Socioeconomic History   Marital status: Single    Spouse name: Not on file   Number of children: Not on file   Years of education: Not on file   Highest education level: Not on file  Occupational History   Not on file  Tobacco Use    Smoking status: Every Day    Packs/day: 1.00    Years: 18.00    Pack years: 18.00    Types: Cigarettes   Smokeless tobacco: Never  Vaping Use   Vaping Use: Never used  Substance and Sexual Activity   Alcohol use: No    Comment: rarely   Drug use: No   Sexual activity: Not on file  Other Topics Concern   Not on file  Social History Narrative   Retired (from [employer]). No children, never been married. Has 8 siblings (5 are deceased).   Social Determinants of Health   Financial Resource Strain: Low Risk    Difficulty of Paying Living Expenses: Not hard at all  Food Insecurity: No Food Insecurity   Worried About Charity fundraiser in the Last Year: Never true   [place] in the Last Year: Never true  Transportation Needs: No Transportation Needs   Lack of Transportation (Medical): No   Lack of Transportation (Non-Medical): No  Physical Activity: Sufficiently Active   Days of Exercise per Week: 7 days   Minutes of Exercise per Session: 30 min  Stress: No Stress Concern Present   Feeling of Stress : Not at all  Social Connections: Moderately Isolated   Frequency of Communication with Friends and Family: More than three times  a week   Frequency of Social Gatherings with Friends and Family: More than three times a week   Attends Religious Services: 1 to 4 times per year   Active Member of [employer] or Organizations: No   Attends Archivist Meetings: Never   Marital Status: Never married  Human resources officer Violence: Not At Risk   Fear of Current or Ex-Partner: No   Emotionally Abused: No   Physically Abused: No   Sexually Abused: No    Outpatient Medications Prior to Visit  Medication Sig Dispense Refill   albuterol (PROVENTIL) (2.5 MG/3ML) 0.083% nebulizer solution Take 3 mLs (2.5 mg total) by nebulization every 6 (six) hours as needed for wheezing or shortness of breath. 75 mL 12   amLODipine (NORVASC) 10 MG tablet Take 1 tablet (10 mg total) by mouth daily. 90  tablet 0   aspirin EC 81 MG tablet Take 1 tablet (81 mg total) by mouth daily with breakfast. 30 tablet 11   atorvastatin (LIPITOR) 20 MG tablet Take 1 tablet (20 mg total) by mouth daily. 90 tablet 0   Calcium Carbonate-Vit D-Min (CALTRATE 600+D PLUS MINERALS) 600-800 MG-UNIT TABS Take 1 tablet by mouth 2 (two) times daily. 180 tablet 1   EPINEPHrine 0.3 mg/0.3 mL IJ SOAJ injection Inject 0.3 mg into the muscle as needed for anaphylaxis. 1 each 0   folic acid (FOLVITE) 1 MG tablet Take 1 tablet (1 mg total) by mouth daily. 90 tablet 0   metFORMIN (GLUCOPHAGE) 500 MG tablet Take 1 tablet (500 mg total) by mouth 2 (two) times daily. 180 tablet 0   olmesartan (BENICAR) 40 MG tablet Take 1 tablet (40 mg total) by mouth daily. 90 tablet 0   Zinc 50 MG CAPS Take 1 capsule by mouth daily.     No facility-administered medications prior to visit.    Allergies  Allergen Reactions   Penicillins Rash    Has patient had a PCN reaction causing immediate rash, facial/tongue/throat swelling, SOB or lightheadedness with hypotension: {Yes/No:[id number] Has patient had a PCN reaction causing severe rash involving mucus membranes or skin necrosis: Yes Has patient had a PCN reaction that required hospitalization No Has patient had a PCN reaction occurring within the last 10 years: No If all of the above answers are "NO", then may proceed with Cephalosporin use.  **Has tolerated keflex     ROS Review of Systems  Constitutional:  Negative for chills and fever.  HENT:  Positive for congestion, postnasal drip, sinus pressure and sore throat.   Eyes:  Negative for pain and discharge.       Right eye blindness  Respiratory:  Positive for cough. Negative for shortness of breath.   Cardiovascular:  Negative for chest pain and palpitations.  Gastrointestinal:  Negative for abdominal pain, diarrhea, nausea and vomiting.  Endocrine: Negative for polydipsia and polyuria.  Genitourinary:  Negative for dysuria and  hematuria.  Musculoskeletal:  Negative for neck pain and neck stiffness.  Skin:  Negative for rash.  Neurological:  Negative for dizziness and weakness.  Psychiatric/Behavioral:  Negative for agitation and behavioral problems.      Objective:    Physical Exam Vitals reviewed.  Constitutional:      General: She is not in acute distress.    Appearance: She is not diaphoretic.  HENT:     Head: Normocephalic and atraumatic.     Nose:     Right Sinus: Frontal sinus tenderness present.     Left Sinus: Frontal sinus  tenderness present.     Mouth/Throat:     Mouth: Mucous membranes are moist.  Eyes:     General: No scleral icterus.    Comments: Right eye blindness  Cardiovascular:     Rate and Rhythm: Normal rate and regular rhythm.     Pulses: Normal pulses.     Heart sounds: Normal heart sounds. No murmur heard. Pulmonary:     Breath sounds: Normal breath sounds. No wheezing or rales.  Abdominal:     Palpations: Abdomen is soft.     Tenderness: There is no abdominal tenderness.  Musculoskeletal:     Cervical back: Neck supple. No tenderness.     Right lower leg: No edema.     Left lower leg: No edema.  Skin:    General: Skin is warm.     Findings: No rash.  Neurological:     General: No focal deficit present.     Mental Status: She is alert and oriented to person, place, and time.  Psychiatric:        Mood and Affect: Mood normal.        Behavior: Behavior normal.    BP 118/62 (BP Location: Left Arm, Patient Position: Sitting, Cuff Size: Normal)    Pulse 73    Resp 18    Ht _0  (1.626 m)    Wt 156 lb 1.9 oz (70.8 kg)    SpO2 92%    BMI 26.80 kg/m  Wt Readings from Last 3 Encounters:  03/12/21 156 lb 1.9 oz (70.8 kg)  01/18/21 156 lb 6.4 oz (70.9 kg)  12/28/20 155 lb 1.3 oz (70.3 kg)    Lab Results  Component Value Date   TSH 1.30 09/07/2020   Lab Results  Component Value Date   WBC 5.4 11/04/2020   HGB 10.9 (L) 11/04/2020   HCT 35.2 (L) 11/04/2020   MCV  92.9 11/04/2020   PLT 236 11/04/2020   Lab Results  Component Value Date   NA 138 11/04/2020   K 4.2 11/04/2020   CO2 28 11/04/2020   GLUCOSE 95 11/04/2020   BUN 13 11/04/2020   CREATININE 0.75 11/04/2020   BILITOT 0.4 11/03/2020   ALKPHOS 109 11/03/2020   AST 13 (L) 11/03/2020   ALT 12 11/03/2020   PROT 7.0 11/03/2020   ALBUMIN 3.5 11/03/2020   CALCIUM 8.6 (L) 11/04/2020   ANIONGAP 6 11/04/2020   EGFR 69 09/07/2020   Lab Results  Component Value Date   CHOL 126 09/07/2020   Lab Results  Component Value Date   HDL 46 (L) 09/07/2020   Lab Results  Component Value Date   LDLCALC 58 09/07/2020   Lab Results  Component Value Date   TRIG 138 09/07/2020   Lab Results  Component Value Date   CHOLHDL 2.7 09/07/2020   Lab Results  Component Value Date   HGBA1C 5.5 09/07/2020      Assessment & Plan:   Problem Bailey Items Addressed This Visit       Cardiovascular and Mediastinum   Essential hypertension    BP Readings from Last 1 Encounters:  03/12/21 118/62  Well-controlled with amlodipine and olmesartan Counseled for compliance with the medications Advised DASH diet and moderate exercise/walking as tolerated      (HFpEF) heart failure with preserved ejection fraction ([place]) - Primary    Appears euvolemic currently Not on any diuretic      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[place])    Last echo reviewed No  dyspnea or LE swelling currently Had hemoptysis recently, now resolved Referred to cardiology - had repeat echo        Respiratory   COPD (chronic obstructive pulmonary disease) ([place])    Well-controlled with Dulera and albuterol as needed Followed by pulmonology      Chronic respiratory failure with hypoxia ([place])    Due to COPD Has home O2 -uses a at nighttime and as needed for dyspnea        Other   Tobacco abuse    Smokes 1 pack/day  Asked about quitting: confirms that she currently smokes cigarettes Advise to quit smoking: Educated  about QUITTING to reduce the risk of cancer, cardio and cerebrovascular disease. Assess willingness: Unwilling to quit at this time, but is working on cutting back. Assist with counseling and pharmacotherapy: Counseled for 5 minutes and literature provided. Arrange for follow up: Follow up in 3 months and continue to offer help.      Prediabetes    Lab Results  Component Value Date   HGBA1C 5.5 09/07/2020  Takes metformin 500 mg twice daily      HLD (hyperlipidemia)    On atorvastatin 20 mg daily      Other Visit Diagnoses     Acute non-recurrent frontal sinusitis     [place] as needed for now If persistent symptoms despite symptomatic treatment, will start antibiotic    Refused influenza vaccine           No orders of the defined types were placed in this encounter.   Follow-up: Return in about 6 months (around 09/09/2021) for Annual physical.    [name], MD

## 2021-03-12 NOTE — Assessment & Plan Note (Addendum)
Last echo reviewed No dyspnea or LE swelling currently Had hemoptysis recently, now resolved Referred to cardiology - had repeat echo

## 2021-03-12 NOTE — Assessment & Plan Note (Signed)
Lab Results  Component Value Date   HGBA1C 5.5 09/07/2020   Takes metformin 500 mg twice daily

## 2021-03-12 NOTE — Assessment & Plan Note (Signed)
On atorvastatin 20 mg daily 

## 2021-03-12 NOTE — Assessment & Plan Note (Signed)
Due to COPD Has home O2 -uses a at nighttime and as needed for dyspnea

## 2021-03-12 NOTE — Assessment & Plan Note (Addendum)
BP Readings from Last 1 Encounters:  03/12/21 118/62   Well-controlled with amlodipine and olmesartan Counseled for compliance with the medications Advised DASH diet and moderate exercise/walking as tolerated

## 2021-03-13 ENCOUNTER — Inpatient Hospital Stay (HOSPITAL_COMMUNITY)
Admission: EM | Admit: 2021-03-13 | Discharge: 2021-03-15 | DRG: 190 | Disposition: A | Discharge status: Home / Self Care | Payer: Medicare Other | Attending: Student | Admitting: Student

## 2021-03-13 ENCOUNTER — Encounter (HOSPITAL_COMMUNITY): Payer: Self-pay | Admitting: Emergency Medicine

## 2021-03-13 ENCOUNTER — Emergency Department (HOSPITAL_COMMUNITY): Payer: Medicare Other

## 2021-03-13 ENCOUNTER — Other Ambulatory Visit: Payer: Self-pay

## 2021-03-13 DIAGNOSIS — Z20822 Contact with and (suspected) exposure to covid-19: Secondary | ICD-10-CM | POA: Diagnosis present

## 2021-03-13 DIAGNOSIS — J849 Interstitial pulmonary disease, unspecified: Secondary | ICD-10-CM | POA: Diagnosis present

## 2021-03-13 DIAGNOSIS — J9622 Acute and chronic respiratory failure with hypercapnia: Secondary | ICD-10-CM | POA: Diagnosis present

## 2021-03-13 DIAGNOSIS — Z79899 Other long term (current) drug therapy: Secondary | ICD-10-CM

## 2021-03-13 DIAGNOSIS — I10 Essential (primary) hypertension: Secondary | ICD-10-CM | POA: Diagnosis present

## 2021-03-13 DIAGNOSIS — J441 Chronic obstructive pulmonary disease with (acute) exacerbation: Principal | ICD-10-CM | POA: Diagnosis present

## 2021-03-13 DIAGNOSIS — Z833 Family history of diabetes mellitus: Secondary | ICD-10-CM | POA: Diagnosis not present

## 2021-03-13 DIAGNOSIS — I5032 Chronic diastolic (congestive) heart failure: Secondary | ICD-10-CM | POA: Diagnosis present

## 2021-03-13 DIAGNOSIS — J9621 Acute and chronic respiratory failure with hypoxia: Secondary | ICD-10-CM | POA: Diagnosis present

## 2021-03-13 DIAGNOSIS — I110 Hypertensive heart disease with heart failure: Secondary | ICD-10-CM | POA: Diagnosis present

## 2021-03-13 DIAGNOSIS — J449 Chronic obstructive pulmonary disease, unspecified: Secondary | ICD-10-CM | POA: Diagnosis not present

## 2021-03-13 DIAGNOSIS — Z880 Allergy status to penicillin: Secondary | ICD-10-CM | POA: Diagnosis not present

## 2021-03-13 DIAGNOSIS — H5440 Blindness, one eye, unspecified eye: Secondary | ICD-10-CM | POA: Diagnosis present

## 2021-03-13 DIAGNOSIS — R4182 Altered mental status, unspecified: Secondary | ICD-10-CM | POA: Diagnosis present

## 2021-03-13 DIAGNOSIS — J9602 Acute respiratory failure with hypercapnia: Secondary | ICD-10-CM | POA: Diagnosis present

## 2021-03-13 DIAGNOSIS — Z7982 Long term (current) use of aspirin: Secondary | ICD-10-CM

## 2021-03-13 DIAGNOSIS — Z791 Long term (current) use of non-steroidal anti-inflammatories (NSAID): Secondary | ICD-10-CM | POA: Diagnosis not present

## 2021-03-13 DIAGNOSIS — I2723 Pulmonary hypertension due to lung diseases and hypoxia: Secondary | ICD-10-CM | POA: Diagnosis present

## 2021-03-13 DIAGNOSIS — H5461 Unqualified visual loss, right eye, normal vision left eye: Secondary | ICD-10-CM | POA: Diagnosis present

## 2021-03-13 DIAGNOSIS — Z720 Tobacco use: Secondary | ICD-10-CM

## 2021-03-13 DIAGNOSIS — Z66 Do not resuscitate: Secondary | ICD-10-CM | POA: Diagnosis present

## 2021-03-13 DIAGNOSIS — J9601 Acute respiratory failure with hypoxia: Secondary | ICD-10-CM | POA: Diagnosis present

## 2021-03-13 DIAGNOSIS — J9611 Chronic respiratory failure with hypoxia: Secondary | ICD-10-CM | POA: Diagnosis present

## 2021-03-13 DIAGNOSIS — Z7984 Long term (current) use of oral hypoglycemic drugs: Secondary | ICD-10-CM

## 2021-03-13 DIAGNOSIS — G9341 Metabolic encephalopathy: Secondary | ICD-10-CM | POA: Diagnosis present

## 2021-03-13 DIAGNOSIS — W19XXXA Unspecified fall, initial encounter: Secondary | ICD-10-CM | POA: Diagnosis not present

## 2021-03-13 LAB — BLOOD GAS, VENOUS
Acid-Base Excess: 3.2 mmol/L — ABNORMAL HIGH (ref 0.0–2.0)
Acid-base deficit: 0.2 mmol/L (ref 0.0–2.0)
Bicarbonate: 25.6 mmol/L (ref 20.0–28.0)
Bicarbonate: 22.1 mmol/L (ref 20.0–28.0)
FIO2: 44
FIO2: 44
O2 Saturation: 88.9 %
O2 Saturation: 76.5 %
Patient temperature: 37.3
Patient temperature: 37.1
pCO2, Ven: 67.6 mmHg — ABNORMAL HIGH (ref 44.0–60.0)
pCO2, Ven: 70.7 mmHg (ref 44.0–60.0)
pH, Ven: 7.266 (ref 7.250–7.430)
pH, Ven: 7.206 — ABNORMAL LOW (ref 7.250–7.430)
pO2, Ven: 63.4 mmHg — ABNORMAL HIGH (ref 32.0–45.0)
pO2, Ven: 50.4 mmHg — ABNORMAL HIGH (ref 32.0–45.0)

## 2021-03-13 LAB — COMPREHENSIVE METABOLIC PANEL
ALT: 12 U/L (ref 0–44)
AST: 17 U/L (ref 15–41)
Albumin: 4 g/dL (ref 3.5–5.0)
Alkaline Phosphatase: 76 U/L (ref 38–126)
Anion gap: 6 (ref 5–15)
BUN: 12 mg/dL (ref 8–23)
CO2: 27 mmol/L (ref 22–32)
Calcium: 8.5 mg/dL — ABNORMAL LOW (ref 8.9–10.3)
Chloride: 104 mmol/L (ref 98–111)
Creatinine, Ser: 0.69 mg/dL (ref 0.44–1.00)
GFR, Estimated: >60 mL/min (ref >60)
Glucose, Bld: 105 mg/dL — ABNORMAL HIGH (ref 70–99)
Potassium: 4.7 mmol/L (ref 3.5–5.1)
Sodium: 137 mmol/L (ref 135–145)
Total Bilirubin: 0.6 mg/dL (ref 0.3–1.2)
Total Protein: 8.1 g/dL (ref 6.5–8.1)

## 2021-03-13 LAB — CBC WITH DIFFERENTIAL/PLATELET
Abs Immature Granulocytes: 0.03 10*3/uL (ref 0.00–0.07)
Basophils Absolute: 0.1 10*3/uL (ref 0.0–0.1)
Basophils Relative: 1 %
Eosinophils Absolute: 0.7 10*3/uL — ABNORMAL HIGH (ref 0.0–0.5)
Eosinophils Relative: 11 %
HCT: 42.2 % (ref 36.0–46.0)
Hemoglobin: 12.6 g/dL (ref 12.0–15.0)
Immature Granulocytes: 0 %
Lymphocytes Relative: 15 %
Lymphs Abs: 1 10*3/uL (ref 0.7–4.0)
MCH: 26.5 pg (ref 26.0–34.0)
MCHC: 29.9 g/dL — ABNORMAL LOW (ref 30.0–36.0)
MCV: 88.7 fL (ref 80.0–100.0)
Monocytes Absolute: 0.5 10*3/uL (ref 0.1–1.0)
Monocytes Relative: 7 %
Neutro Abs: 4.5 10*3/uL (ref 1.7–7.7)
Neutrophils Relative %: 66 %
Platelets: 278 10*3/uL (ref 150–400)
RBC: 4.76 MIL/uL (ref 3.87–5.11)
RDW: 15.6 % — ABNORMAL HIGH (ref 11.5–15.5)
WBC: 6.8 10*3/uL (ref 4.0–10.5)
nRBC: 0 % (ref 0.0–0.2)

## 2021-03-13 LAB — BLOOD GAS, ARTERIAL
Acid-Base Excess: 1.3 mmol/L (ref 0.0–2.0)
Bicarbonate: 24.4 mmol/L (ref 20.0–28.0)
Drawn by: 21310
FIO2: 40
O2 Saturation: 93.4 %
Patient temperature: 36.8
pCO2 arterial: 61.3 mmHg — ABNORMAL HIGH (ref 32.0–48.0)
pH, Arterial: 7.273 — ABNORMAL LOW (ref 7.350–7.450)
pO2, Arterial: 78.4 mmHg — ABNORMAL LOW (ref 83.0–108.0)

## 2021-03-13 LAB — EXPECTORATED SPUTUM ASSESSMENT W GRAM STAIN, RFLX TO RESP C

## 2021-03-13 LAB — CBG MONITORING, ED: Glucose-Capillary: 101 mg/dL — ABNORMAL HIGH (ref 70–99)

## 2021-03-13 LAB — RESP PANEL BY RT-PCR (FLU A&B, COVID) ARPGX2
Influenza A by PCR: NEGATIVE
Influenza B by PCR: NEGATIVE
SARS Coronavirus 2 by RT PCR: NEGATIVE

## 2021-03-13 LAB — BRAIN NATRIURETIC PEPTIDE: B Natriuretic Peptide: 287 pg/mL — ABNORMAL HIGH (ref 0.0–100.0)

## 2021-03-13 MED ORDER — METHYLPREDNISOLONE SODIUM SUCC 125 MG IJ SOLR
60.0000 mg | Freq: Two times a day (BID) | INTRAMUSCULAR | Status: AC
  Administered 2021-03-14 (×2): 60 mg via INTRAVENOUS
  Filled 2021-03-13 (×2): qty 2

## 2021-03-13 MED ORDER — PREDNISONE 20 MG PO TABS
40.0000 mg | ORAL_TABLET | Freq: Every day | ORAL | Status: DC
  Administered 2021-03-15: 40 mg via ORAL
  Filled 2021-03-13: qty 2

## 2021-03-13 MED ORDER — ATORVASTATIN CALCIUM 20 MG PO TABS
20.0000 mg | ORAL_TABLET | Freq: Every day | ORAL | Status: DC
  Administered 2021-03-14 – 2021-03-15 (×2): 20 mg via ORAL
  Filled 2021-03-13: qty 1
  Filled 2021-03-13: qty 2

## 2021-03-13 MED ORDER — SODIUM CHLORIDE 0.9 % IV SOLN
1.0000 g | Freq: Once | INTRAVENOUS | Status: AC
  Administered 2021-03-13: 1 g via INTRAVENOUS
  Filled 2021-03-13: qty 10

## 2021-03-13 MED ORDER — IRBESARTAN 150 MG PO TABS
300.0000 mg | ORAL_TABLET | Freq: Every day | ORAL | Status: DC
  Administered 2021-03-14 – 2021-03-15 (×2): 300 mg via ORAL
  Filled 2021-03-13 (×2): qty 2

## 2021-03-13 MED ORDER — ACETAMINOPHEN 325 MG PO TABS: 650.0000 mg | ORAL_TABLET | Freq: Four times a day (QID) | ORAL | Status: DC | PRN

## 2021-03-13 MED ORDER — AMLODIPINE BESYLATE 5 MG PO TABS
10.0000 mg | ORAL_TABLET | Freq: Every day | ORAL | Status: DC
  Administered 2021-03-14 – 2021-03-15 (×2): 10 mg via ORAL
  Filled 2021-03-13 (×2): qty 2

## 2021-03-13 MED ORDER — ONDANSETRON HCL 4 MG/2ML IJ SOLN: 4.0000 mg | Freq: Four times a day (QID) | INTRAMUSCULAR | Status: DC | PRN

## 2021-03-13 MED ORDER — IPRATROPIUM-ALBUTEROL 0.5-2.5 (3) MG/3ML IN SOLN
3.0000 mL | Freq: Four times a day (QID) | RESPIRATORY_TRACT | Status: DC
  Administered 2021-03-14 (×3): 3 mL via RESPIRATORY_TRACT
  Filled 2021-03-13 (×3): qty 3

## 2021-03-13 MED ORDER — METHYLPREDNISOLONE SODIUM SUCC 125 MG IJ SOLR
125.0000 mg | Freq: Once | INTRAMUSCULAR | Status: AC
  Administered 2021-03-13: 125 mg via INTRAVENOUS
  Filled 2021-03-13: qty 2

## 2021-03-13 MED ORDER — ASPIRIN EC 81 MG PO TBEC
81.0000 mg | DELAYED_RELEASE_TABLET | Freq: Every day | ORAL | Status: DC
  Administered 2021-03-14 – 2021-03-15 (×2): 81 mg via ORAL
  Filled 2021-03-13 (×2): qty 1

## 2021-03-13 MED ORDER — SODIUM CHLORIDE 0.9 % IV SOLN
1.0000 g | INTRAVENOUS | Status: DC
  Administered 2021-03-14: 1 g via INTRAVENOUS
  Filled 2021-03-13: qty 10

## 2021-03-13 MED ORDER — ACETAMINOPHEN 650 MG RE SUPP: 650.0000 mg | Freq: Four times a day (QID) | RECTAL | Status: DC | PRN

## 2021-03-13 MED ORDER — ALBUTEROL SULFATE (2.5 MG/3ML) 0.083% IN NEBU
INHALATION_SOLUTION | RESPIRATORY_TRACT | Status: AC
  Filled 2021-03-13: qty 12

## 2021-03-13 MED ORDER — ENOXAPARIN SODIUM 40 MG/0.4ML IJ SOSY
40.0000 mg | PREFILLED_SYRINGE | INTRAMUSCULAR | Status: DC
  Administered 2021-03-13 – 2021-03-14 (×2): 40 mg via SUBCUTANEOUS
  Filled 2021-03-13 (×2): qty 0.4

## 2021-03-13 MED ORDER — IPRATROPIUM BROMIDE 0.02 % IN SOLN: 0.5000 mg | Freq: Four times a day (QID) | RESPIRATORY_TRACT | Status: DC

## 2021-03-13 MED ORDER — GUAIFENESIN-DM 100-10 MG/5ML PO SYRP
5.0000 mL | ORAL_SOLUTION | Freq: Three times a day (TID) | ORAL | Status: AC
  Administered 2021-03-14 (×3): 5 mL via ORAL
  Filled 2021-03-13 (×4): qty 5

## 2021-03-13 MED ORDER — IPRATROPIUM-ALBUTEROL 0.5-2.5 (3) MG/3ML IN SOLN
3.0000 mL | RESPIRATORY_TRACT | Status: DC | PRN
  Administered 2021-03-13: 3 mL via RESPIRATORY_TRACT
  Filled 2021-03-13: qty 3

## 2021-03-13 MED ORDER — ONDANSETRON HCL 4 MG PO TABS: 4.0000 mg | ORAL_TABLET | Freq: Four times a day (QID) | ORAL | Status: DC | PRN

## 2021-03-13 MED ORDER — IPRATROPIUM-ALBUTEROL 0.5-2.5 (3) MG/3ML IN SOLN
3.0000 mL | Freq: Once | RESPIRATORY_TRACT | Status: AC
  Administered 2021-03-13: 3 mL via RESPIRATORY_TRACT
  Filled 2021-03-13: qty 3

## 2021-03-13 MED ORDER — NICOTINE 14 MG/24HR TD PT24
14.0000 mg | MEDICATED_PATCH | Freq: Every day | TRANSDERMAL | Status: DC
  Administered 2021-03-14 – 2021-03-15 (×2): 14 mg via TRANSDERMAL
  Filled 2021-03-13 (×2): qty 1

## 2021-03-13 MED ORDER — POTASSIUM CHLORIDE CRYS ER 20 MEQ PO TBCR: 40.0000 meq | EXTENDED_RELEASE_TABLET | Freq: Once | ORAL | Status: DC

## 2021-03-13 MED ORDER — AZITHROMYCIN 250 MG PO TABS
500.0000 mg | ORAL_TABLET | Freq: Once | ORAL | Status: AC
  Administered 2021-03-13: 500 mg via ORAL
  Filled 2021-03-13: qty 2

## 2021-03-13 MED ORDER — ALBUTEROL (5 MG/ML) CONTINUOUS INHALATION SOLN
10.0000 mg/h | INHALATION_SOLUTION | Freq: Once | RESPIRATORY_TRACT | Status: AC
  Administered 2021-03-13: 10 mg/h via RESPIRATORY_TRACT

## 2021-03-13 MED ORDER — POLYETHYLENE GLYCOL 3350 17 G PO PACK: 17.0000 g | PACK | Freq: Every day | ORAL | Status: DC | PRN

## 2021-03-13 NOTE — ED Triage Notes (Signed)
Pt last seen well was 11am yesterday, niece with pt today and stated talked with her at 50 today and pt was confused. Pt has very congested cough, niece states always has cough due to smoking but is a little worse. Gen weakness noted.

## 2021-03-13 NOTE — ED Provider Notes (Signed)
[facility] EMERGENCY DEPARTMENT Provider Note   CSN: [id number] Arrival date & time: 03/13/21  1416     History  Chief Complaint  Patient presents with   Altered Mental Status    Marisa Bailey is a 81 y.o. female.   Altered Mental Status Presenting symptoms: confusion   Patient resents for altered mental status.  She has had a worsening cough over the past several days.  She does have a history of COPD.  She is reportedly supposed to be on home oxygen but does not use it.  Her niece believes that this is because she still smokes and does not want to cause a burn injury.  Today, she had a fall and landed on her bottom.  She was speaking nonsensically.  For this reason, patient presents to the ED. on arrival, patient denies any symptoms.  She does agree that she has had a worsening cough that has been productive of yellow sputum.  Her niece accompanies her in the ED.  Per niece, patient's mentation has improved but is not back to baseline.  Patient does live alone.    Home Medications Prior to Admission medications   Medication Sig Start Date End Date Taking? Authorizing Provider  amLODipine (NORVASC) 10 MG tablet Take 1 tablet (10 mg total) by mouth daily. 10/02/20  Yes Ailene Ards, NP  aspirin EC 81 MG tablet Take 1 tablet (81 mg total) by mouth daily with breakfast. 05/28/19  Yes Emokpae, Courage, MD  atorvastatin (LIPITOR) 20 MG tablet Take 1 tablet (20 mg total) by mouth daily. 03/09/21  Yes [name], MD  azithromycin (ZITHROMAX) 500 MG tablet Take 1 tablet (500 mg total) by mouth daily for 2 days. Take 1 tablet daily for 3 days. 03/16/21 03/18/21 Yes Mercy Riding, MD  Cholecalciferol (VITAMIN D3) 25 MCG (1000 UT) CAPS Take 2 capsules by mouth daily.   Yes [provider]  EPINEPHrine 0.3 mg/0.3 mL IJ SOAJ injection Inject 0.3 mg into the muscle as needed for anaphylaxis. 11/23/19  Yes Couture, Cortni S, PA-C  meloxicam (MOBIC) 7.5 MG tablet Take 7.5 mg by mouth  daily.   Yes [provider]  metFORMIN (GLUCOPHAGE) 500 MG tablet Take 1 tablet (500 mg total) by mouth 2 (two) times daily. 01/22/21  Yes [name], MD  olmesartan (BENICAR) 40 MG tablet Take 1 tablet (40 mg total) by mouth daily. 02/26/21  Yes Patel, Colin Broach, MD  umeclidinium-vilanterol [facility] ELLIPTA) 62.5-25 MCG/ACT AEPB Inhale 1 puff into the lungs daily. 03/15/21  Yes Mercy Riding, MD  Zinc 50 MG CAPS Take 1 capsule by mouth daily.   Yes [provider]  albuterol (PROVENTIL) (2.5 MG/3ML) 0.083% nebulizer solution Take 3 mLs (2.5 mg total) by nebulization every 6 (six) hours as needed for wheezing or shortness of breath. 03/15/21   Mercy Riding, MD  albuterol (VENTOLIN HFA) 108 (90 Base) MCG/ACT inhaler Inhale 2 puffs into the lungs every 6 (six) hours as needed for wheezing or shortness of breath. 03/15/21   Mercy Riding, MD  Calcium Carbonate-Vit D-Min (CALTRATE 600+D PLUS MINERALS) 600-800 MG-UNIT TABS Take 1 tablet by mouth 2 (two) times daily. Patient not taking: Reported on 03/13/2021 12/07/20   [name], MD  folic acid (FOLVITE) 1 MG tablet Take 1 tablet (1 mg total) by mouth daily. Patient not taking: Reported on 03/13/2021 10/02/20   Ailene Ards, NP  nicotine (NICODERM CQ - DOSED IN MG/24 HOURS)  21 mg/24hr patch Place 1 patch (21 mg total) onto the skin daily. 03/16/21   Mercy Riding, MD  predniSONE (DELTASONE) 20 MG tablet Take 2 tablets (40 mg total) by mouth daily with breakfast for 3 days. 03/16/21 03/19/21  Mercy Riding, MD      Allergies    Penicillins    Review of Systems   Review of Systems  Respiratory:  Positive for cough, chest tightness and shortness of breath.   Psychiatric/Behavioral:  Positive for confusion.   All other systems reviewed and are negative.  Physical Exam Updated Vital Signs BP (!) 141/55 (BP Location: Left Arm)    Pulse 60    Temp 98.3 F (36.8 C) (Oral)    Resp 19    Ht _0  (1.626 m)    Wt 70.8 kg    SpO2 100%    BMI  26.79 kg/m  Physical Exam Vitals and nursing note reviewed.  Constitutional:      General: She is not in acute distress.    Appearance: She is well-developed and normal weight. She is not toxic-appearing or diaphoretic.  HENT:     Head: Normocephalic and atraumatic.     Right Ear: External ear normal.     Left Ear: External ear normal.     Nose: Nose normal.     Mouth/Throat:     Mouth: Mucous membranes are moist.     Pharynx: Oropharynx is clear.  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  Left eye is normal in appearance.  Cardiovascular:     Rate and Rhythm: Normal rate and regular rhythm.     Heart sounds: No murmur heard. Pulmonary:     Effort: Pulmonary effort is normal. Prolonged expiration present. No respiratory distress.     Breath sounds: Decreased air movement present. Wheezing present.  Abdominal:     Palpations: Abdomen is soft.     Tenderness: There is no abdominal tenderness.  Musculoskeletal:        General: No swelling.     Cervical back: Normal range of motion. No rigidity.     Right lower leg: No edema.     Left lower leg: No edema.  Skin:    General: Skin is warm and dry.     Capillary Refill: Capillary refill takes less than 2 seconds.     Coloration: Skin is not jaundiced or pale.  Neurological:     General: No focal deficit present.     Mental Status: She is alert and oriented to person, place, and time.     Cranial Nerves: No cranial nerve deficit.     Sensory: No sensory deficit.     Motor: No weakness.     Coordination: Coordination normal.  Psychiatric:        Mood and Affect: Mood normal.        Behavior: Behavior normal.        Thought Content: Thought content normal.        Judgment: Judgment normal.    ED Results / Procedures / Treatments   Labs (all labs ordered are listed, but only abnormal results are displayed) Labs Reviewed  COMPREHENSIVE METABOLIC PANEL - Abnormal; Notable for the following components:       Result Value   Glucose, Bld 105 (*)    Calcium 8.5 (*)    All other components within normal limits  CBC WITH DIFFERENTIAL/PLATELET - Abnormal; Notable for the following components:  MCHC 29.9 (*)    RDW 15.6 (*)    Eosinophils Absolute 0.7 (*)    All other components within normal limits  BRAIN NATRIURETIC PEPTIDE - Abnormal; Notable for the following components:   B Natriuretic Peptide 287.0 (*)    All other components within normal limits  BLOOD GAS, VENOUS - Abnormal; Notable for the following components:   pCO2, Ven 67.6 (*)    pO2, Ven 63.4 (*)    Acid-Base Excess 3.2 (*)    All other components within normal limits  BLOOD GAS, VENOUS - Abnormal; Notable for the following components:   pH, Ven 7.206 (*)    pCO2, Ven 70.7 (*)    pO2, Ven 50.4 (*)    All other components within normal limits  BLOOD GAS, ARTERIAL - Abnormal; Notable for the following components:   pH, Arterial 7.273 (*)    pCO2 arterial 61.3 (*)    pO2, Arterial 78.4 (*)    All other components within normal limits  RENAL FUNCTION PANEL - Abnormal; Notable for the following components:   Glucose, Bld 116 (*)    Calcium 8.3 (*)    All other components within normal limits  CBC - Abnormal; Notable for the following components:   Hemoglobin 11.1 (*)    MCH 24.9 (*)    MCHC 28.2 (*)    RDW 15.7 (*)    All other components within normal limits  TSH - Abnormal; Notable for the following components:   TSH 0.344 (*)    All other components within normal limits  GLUCOSE, CAPILLARY - Abnormal; Notable for the following components:   Glucose-Capillary 165 (*)    All other components within normal limits  GLUCOSE, CAPILLARY - Abnormal; Notable for the following components:   Glucose-Capillary 127 (*)    All other components within normal limits  CBG MONITORING, ED - Abnormal; Notable for the following components:   Glucose-Capillary 101 (*)    All other components within normal limits  CBG MONITORING, ED -  Abnormal; Notable for the following components:   Glucose-Capillary 118 (*)    All other components within normal limits  RESP PANEL BY RT-PCR (FLU A&B, COVID) ARPGX2  EXPECTORATED SPUTUM ASSESSMENT W GRAM STAIN, RFLX TO RESP C  CULTURE, RESPIRATORY W GRAM STAIN  MAGNESIUM  CK  AMMONIA  PROCALCITONIN  PROCALCITONIN    EKG EKG Interpretation  Date/Time:  Tuesday March 13 2021 14:38:14 EST Ventricular Rate:  89 PR Interval:  207 QRS Duration: 84 QT Interval:  349 QTC Calculation: 425 R Axis:   -89 Text Interpretation: Sinus rhythm Left anterior fascicular block Anterior infarct, old Baseline wander in lead(s) I V3 Confirmed by Godfrey Pick [phone]) on 03/13/2021 3:04:55 PM  Radiology No results found.  Procedures Procedures    Medications Ordered in ED Medications  albuterol (PROVENTIL) (2.5 MG/3ML) 0.083% nebulizer solution (  Not Given 03/13/21 1506)  guaiFENesin-dextromethorphan (ROBITUSSIN DM) 100-10 MG/5ML syrup 5 mL (5 mLs Oral Given 03/14/21 2101)  albuterol (PROVENTIL,VENTOLIN) solution continuous neb (10 mg/hr Nebulization Given 03/13/21 1501)  ipratropium-albuterol (DUONEB) 0.5-2.5 (3) MG/3ML nebulizer solution 3 mL (3 mLs Nebulization Given 03/13/21 1502)  methylPREDNISolone sodium succinate (SOLU-MEDROL) 125 mg/2 mL injection 125 mg (125 mg Intravenous Given 03/13/21 1457)  cefTRIAXone (ROCEPHIN) 1 g in sodium chloride 0.9 % 100 mL IVPB (0 g Intravenous Stopped 03/13/21 1536)  azithromycin (ZITHROMAX) tablet 500 mg (500 mg Oral Given 03/13/21 1456)  methylPREDNISolone sodium succinate (SOLU-MEDROL) 125 mg/2 mL injection 60 mg (60  mg Intravenous Given 03/14/21 2100)    ED Course/ Medical Decision Making/ A&P                           Medical Decision Making Amount and/or Complexity of Data Reviewed Labs: ordered. Radiology: ordered.  Risk Prescription drug management. Decision regarding hospitalization.   This patient presents to the ED for concern of altered  mental status, this involves an extensive number of treatment options, and is a complaint that carries with it a high risk of complications and morbidity.  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  The pertinent results include: Respiratory acidosis   Imaging Studies ordered:  I ordered imaging studies including chest x-ray I independently visualized and interpreted imaging which showed chronic interstitial lung disease I agree with the radiologist interpretation   Cardiac Monitoring:  The patient was maintained on a cardiac monitor.  I personally viewed and interpreted the cardiac monitored which showed an underlying rhythm of: Sinus rhythm   Medicines ordered and prescription drug management:  I ordered medication including Solu-Medrol, nebulized breathing treatments, and antibiotics for COPD exacerbation Reevaluation of the patient after these medicines showed that the patient improved I have reviewed the patients home medicines and have made adjustments as needed  Problem List / ED Course:  81 year old female with history of COPD and CHF presenting with to the ED for what family describes as altered mental status.  This occurred earlier today.  Patient is alert and oriented upon arrival.  She does endorse a recent cough with increased sputum production and worsening shortness of breath over the past several days.  Vital signs on arrival are notable for hypoxia.  She is reportedly supposed to be on oxygen at home but does not use it.  Patient was placed on 6 L of oxygen by nasal cannula.  On lung auscultation, patient  has severe wheezing and diminished air movement.  History and exam are consistent with COPD exacerbation.  IV antibiotics, steroids, and continuous nebulized breathing treatment were initiated.  Patient seemed to respond very well to this.  Her breathing is not significantly labored and she is able to speak in complete sentences.  She actually states that she wants to go home.  It was explained to the patient that she would be best served coming to the hospital to improve her breathing so that she is able to properly ventilate and not retaining carbon dioxide, which is what I suspect was the reason of her altered mental status prior to arrival.  Patient was admitted to hospitalist for further management.   Reevaluation:  After the interventions noted above, I reevaluated the patient and found that they have :improved   Social Determinants of Health:  Elderly, chronically ill, lives alone   Dispostion:  After consideration of the diagnostic results and the patients response to treatment, I feel that the patent would benefit from admission to hospital.   CRITICAL CARE Performed by: Godfrey Pick   Total critical care time: 35 minutes  Critical care time was exclusive of separately billable procedures and treating other patients.  Critical care was necessary to treat or prevent imminent  or life-threatening deterioration.  Critical care was time spent personally by me on the following activities: development of treatment plan with patient and/or surrogate as well as nursing, discussions with consultants, evaluation of patient's response to treatment, examination of patient, obtaining history from patient or surrogate, ordering and performing treatments and interventions, ordering and review of laboratory studies, ordering and review of radiographic studies, pulse oximetry and re-evaluation of patient's condition.         Final Clinical Impression(s) / ED Diagnoses Final diagnoses:   Chronic obstructive pulmonary disease, unspecified COPD type ([place])  COPD exacerbation ([place])  Acute respiratory failure with hypoxia and hypercapnia ([place])    Rx / DC Orders ED Discharge Orders          Ordered    predniSONE (DELTASONE) 20 MG tablet  Daily with breakfast        03/15/21 0905    nicotine (NICODERM CQ - DOSED IN MG/24 HOURS) 21 mg/24hr patch  Daily        03/15/21 0905    azithromycin (ZITHROMAX) 500 MG tablet  Daily        03/15/21 0905    albuterol (PROVENTIL) (2.5 MG/3ML) 0.083% nebulizer solution  Every 6 hours PRN        03/15/21 0905    albuterol (VENTOLIN HFA) 108 (90 Base) MCG/ACT inhaler  Every 6 hours PRN        03/15/21 0905    umeclidinium-vilanterol (ANORO ELLIPTA) 62.5-25 MCG/ACT AEPB  Daily        03/15/21 0905    Increase activity slowly        03/15/21 0905    Diet - low sodium heart healthy        03/15/21 0905    Discharge instructions       Comments: It has been a pleasure taking care of you!  You were hospitalized due to COPD exacerbation likely from smoking cigarettes.  Your symptoms improved with treatment.  We are discharging you more steroid to complete treatment course.  We have started you on new inhalers as well.  Please review your new medication list and the directions on your medications before you take them.  We strongly recommend you quit smoking cigarettes.  Follow-up with your primary care doctor in 1 to 2 weeks or sooner if needed.  It is important that you quit smoking cigarettes.  You may use nicotine patch to help you quit smoking.  Nicotine patch is available over-the-counter.  You may also discuss other options to help you quit smoking with your primary care doctor. You can also talk to professional counselors at 1-800-QUIT-NOW [phone]) for free smoking cessation counseling.     Take care,   03/15/21 0905    Call MD for:  difficulty breathing, headache or visual disturbances        03/15/21 0905    Call MD for:   extreme fatigue        03/15/21 0905              Godfrey Pick, MD 03/15/21 [phone]

## 2021-03-13 NOTE — ED Notes (Signed)
Dr. Doren Custard notified of hypoxia, ams, tachypnea and possible Code Sepsis.

## 2021-03-13 NOTE — H&P (Signed)
History and Physical    Marisa Bailey JZP:[id number] DOB: March 24, 1940 DOA: 03/13/2021  PCP: [name], MD   Patient coming from: Home  I have personally briefly reviewed patient's old medical records in [place]  Chief Complaint: Difficulty breathing  HPI: Marisa Bailey is a 81 y.o. female with medical history significant for COPD, chronic respiratory failure, hypertension, current tobacco use. Patient presented to the ED with complaints of cough, congestion and confusion.  The time of my evaluation patient is awake alert oriented and able to tell me her name and answer a few questions, but she is still slightly confused.  Patient's niece is at [place], works in Retail buyer and processing limits here at [employer]. She confirms above history.  Patient is supposed to be on home O2, she does not use it.  She reports that patient speech was not making sense, it also appeared she did not understand when family was talking to her.  Yesterday patient was fine, but, confusion was noted today. Per niece at bedside, patient also fell today, fall was witnessed, patient fell on her buttock.  She did not hit her head.  ED Course: Temperature 99.1.  Heart rate 80s to 90s.  Tachypneic, respiratory rate mostly 20- 30.  O2 sats down to 59% on room air, she was placed on 6 L.  Repeat venous blood gas showed pH of 7.2, PCO2 of 70. Steroids, ceftriaxone and azithromycin started.  Hospitalist admit for COPD exacerbation.  Review of Systems: As per HPI all other systems reviewed and negative.  Past Medical History:  Diagnosis Date   Aspiration pneumonia ([place]) 11/03/2020   Blind right eye    CHF (congestive heart failure) (HCC)    COPD (chronic obstructive pulmonary disease) ([place])    Fx upper humerus-closed 08/17/2012   Hypertension     Past Surgical History:  Procedure Laterality Date   ENUCLEATION       reports that she has been smoking cigarettes. She has a 18.00 pack-year smoking  history. She has never used smokeless tobacco. She reports that she does not drink alcohol and does not use drugs.  Allergies  Allergen Reactions   Penicillins Rash    Has patient had a PCN reaction causing immediate rash, facial/tongue/throat swelling, SOB or lightheadedness with hypotension: {Yes/No:[id number] Has patient had a PCN reaction causing severe rash involving mucus membranes or skin necrosis: Yes Has patient had a PCN reaction that required hospitalization No Has patient had a PCN reaction occurring within the last 10 years: No If all of the above answers are "NO", then may proceed with Cephalosporin use.  **Has tolerated keflex     Family History  Problem Relation Age of Onset   Diabetes Mother     Prior to Admission medications   Medication Sig Start Date End Date Taking? Authorizing Provider  albuterol (PROVENTIL) (2.5 MG/3ML) 0.083% nebulizer solution Take 3 mLs (2.5 mg total) by nebulization every 6 (six) hours as needed for wheezing or shortness of breath. 05/28/19  Yes Edy Mcbane, Courage, MD  albuterol (VENTOLIN HFA) 108 (90 Base) MCG/ACT inhaler Inhale 2 puffs into the lungs every 6 (six) hours as needed for wheezing or shortness of breath.   Yes [provider]  amLODipine (NORVASC) 10 MG tablet Take 1 tablet (10 mg total) by mouth daily. 10/02/20  Yes Ailene Ards, NP  aspirin EC 81 MG tablet Take 1 tablet (81 mg total) by mouth daily with breakfast. 05/28/19  Yes Eisa Conaway, Courage,  MD  atorvastatin (LIPITOR) 20 MG tablet Take 1 tablet (20 mg total) by mouth daily. 03/09/21  Yes [name], MD  Cholecalciferol (VITAMIN D3) 25 MCG (1000 UT) CAPS Take 2 capsules by mouth daily.   Yes [provider]  EPINEPHrine 0.3 mg/0.3 mL IJ SOAJ injection Inject 0.3 mg into the muscle as needed for anaphylaxis. 11/23/19  Yes Couture, Cortni S, PA-C  meloxicam (MOBIC) 7.5 MG tablet Take 7.5 mg by mouth daily.   Yes [provider]  metFORMIN (GLUCOPHAGE)  500 MG tablet Take 1 tablet (500 mg total) by mouth 2 (two) times daily. 01/22/21  Yes [name], MD  olmesartan (BENICAR) 40 MG tablet Take 1 tablet (40 mg total) by mouth daily. 02/26/21  Yes [name], MD  Zinc 50 MG CAPS Take 1 capsule by mouth daily.   Yes [provider]  Calcium Carbonate-Vit D-Min (CALTRATE 600+D PLUS MINERALS) 600-800 MG-UNIT TABS Take 1 tablet by mouth 2 (two) times daily. Patient not taking: Reported on 03/13/2021 12/07/20   [name], MD  Chlorphen-PE-Acetaminophen (NOREL AD) 4-10-325 MG TABS Take 1 tablet by mouth 3 (three) times daily as needed (Nasal congestion). Patient not taking: Reported on 03/13/2021 03/12/21   [name], MD  folic acid (FOLVITE) 1 MG tablet Take 1 tablet (1 mg total) by mouth daily. Patient not taking: Reported on 03/13/2021 10/02/20   Ailene Ards, NP    Physical Exam: Vitals:   03/13/21 1615 03/13/21 1630 03/13/21 1645 03/13/21 1700  BP: (!) 155/51 (!) 157/73 (!) 155/55 (!) 135/54  Pulse: 93 97 96 93  Resp: (!) 24 (!) 29 (!) 27 (!) 23  Temp:      TempSrc:      SpO2: 100% 98% 97% 100%  Weight:      Height:        Constitutional: NAD, calm, comfortable Vitals:   03/13/21 1615 03/13/21 1630 03/13/21 1645 03/13/21 1700  BP: (!) 155/51 (!) 157/73 (!) 155/55 (!) 135/54  Pulse: 93 97 96 93  Resp: (!) 24 (!) 29 (!) 27 (!) 23  Temp:      TempSrc:      SpO2: 100% 98% 97% 100%  Weight:      Height:       Eyes: Right eyelid closed, from prior eye surgery, ? Enucleation per niece.   ENMT: Mucous membranes are moist. .  Neck: normal, supple, no masses, no thyromegaly Respiratory: Diffuse expiratory rhonchi, mild increased work of breathing, tachypneic, Cardiovascular: Regular rate and rhythm, no murmurs / rubs / gallops. No extremity edema.  Lower extremities warm and well-perfused. Abdomen: no tenderness, no masses palpated. No hepatosplenomegaly. Bowel sounds positive.  Musculoskeletal: no clubbing /  cyanosis. No joint deformity upper and lower extremities. Good ROM, no contractures. Normal muscle tone.  Skin: no rashes, lesions, ulcers. No induration Neurologic: No apparent abnormality, moving all extremities spontaneously. Psychiatric: Normal judgment and insight. Alert and oriented x 3.  But slightly confused when answering questions.   Labs on Admission: I have personally reviewed following labs and imaging studies  CBC: Recent Labs  Lab 03/13/21 1445  WBC 6.8  NEUTROABS 4.5  HGB 12.6  HCT 42.2  MCV 88.7  PLT 549   Basic Metabolic Panel: Recent Labs  Lab 03/13/21 1445  NA 137  K 4.7  CL 104  CO2 27  GLUCOSE 105*  BUN 12  CREATININE 0.69  CALCIUM 8.5*   GFR: Estimated Creatinine Clearance: 54.1  mL/min (by C-G formula based on SCr of 0.69 mg/dL). Liver Function Tests: Recent Labs  Lab 03/13/21 1445  AST 17  ALT 12  ALKPHOS 76  BILITOT 0.6  PROT 8.1  ALBUMIN 4.0   No results for input(s): LIPASE, AMYLASE in the last 168 hours. No results for input(s): AMMONIA in the last 168 hours. Coagulation Profile: No results for input(s): INR, PROTIME in the last 168 hours. Cardiac Enzymes: No results for input(s): CKTOTAL, CKMB, CKMBINDEX, TROPONINI in the last 168 hours. BNP (last 3 results) No results for input(s): PROBNP in the last 8760 hours. HbA1C: No results for input(s): HGBA1C in the last 72 hours. CBG: Recent Labs  Lab 03/13/21 1434  GLUCAP 101*   Lipid Profile: No results for input(s): CHOL, HDL, LDLCALC, TRIG, CHOLHDL, LDLDIRECT in the last 72 hours. Thyroid Function Tests: No results for input(s): TSH, T4TOTAL, FREET4, T3FREE, THYROIDAB in the last 72 hours. Anemia Panel: No results for input(s): VITAMINB12, FOLATE, FERRITIN, TIBC, IRON, RETICCTPCT in the last 72 hours. Urine analysis:    Component Value Date/Time   COLORURINE YELLOW 11/23/2019 1240   APPEARANCEUR HAZY (A) 11/23/2019 1240   LABSPEC 1.019 11/23/2019 1240   PHURINE 5.0  11/23/2019 1240   GLUCOSEU NEGATIVE 11/23/2019 1240   HGBUR MODERATE (A) 11/23/2019 1240   BILIRUBINUR NEGATIVE 11/23/2019 1240   KETONESUR NEGATIVE 11/23/2019 1240   PROTEINUR NEGATIVE 11/23/2019 1240   NITRITE NEGATIVE 11/23/2019 1240   LEUKOCYTESUR NEGATIVE 11/23/2019 1240    Radiological Exams on Admission: DG Chest Port 1 View  Result Date: 03/13/2021 CLINICAL DATA:  Cough, congestion EXAM: PORTABLE CHEST 1 VIEW COMPARISON:  01/18/2021 FINDINGS: Bilateral lower lobe interstitial thickening. No focal consolidation. No pleural effusion or pneumothorax. Stable cardiomegaly. No acute osseous abnormality. 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 Superimposed infection cannot be excluded. Electronically Signed   By: Kathreen Devoid M.D.   On: 03/13/2021 15:01    EKG: Independently reviewed.  Sinus rhythm rate 89, LAFB.  QTc 425.  No significant change from prior.  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  Currently on 6 L.  VBG showed pH of 7.2, PCO2 70.  Patient was placed on BiPAP.  Chest X-ray showing chronic interstitial lung disease.  BNP about baseline at 287. - Continue BiPAP - Obtain ABG - IV ceftriaxone -DuoNebs as needed and scheduled -Mucolytic -IV Solu-Medrol  Acute metabolic encephalopathy-on my evaluation, mild confusion.  Likely due to hypoxia and hypercapnia.  Witnessed fall, did not hit head.  No history of head trauma not on anticoagulation. -Diffuse head CT for now  Ongoing tobacco  use -Nicotine partner  Hypertension-stable. -Resume home olmesartan, Norvasc 10   DVT prophylaxis: Lovenox Code Status: DNR.  Confirmed with patient and niece at bedside. Family Communication: Patient's niece [name] at bedside.  She works here at [employer] and processing unit Disposition Plan: ~ 2 days Consults called: None Admission status: inpt, step down I certify that at the point of admission it is my clinical judgment that the patient will require inpatient hospital care  spanning beyond 2 midnights from the point of admission due to high intensity of service, high risk for further deterioration and high frequency of surveillance required.  Bethena Roys MD [facility]  03/13/2021, 10:33 PM

## 2021-03-13 NOTE — Progress Notes (Signed)
Patient placed on BIPAP and is tolerating well at this time. Will continue to monitor as needed.

## 2021-03-14 ENCOUNTER — Encounter (HOSPITAL_COMMUNITY): Payer: Self-pay | Admitting: Student

## 2021-03-14 DIAGNOSIS — J9602 Acute respiratory failure with hypercapnia: Secondary | ICD-10-CM | POA: Diagnosis present

## 2021-03-14 DIAGNOSIS — J9601 Acute respiratory failure with hypoxia: Secondary | ICD-10-CM | POA: Diagnosis present

## 2021-03-14 DIAGNOSIS — G9341 Metabolic encephalopathy: Secondary | ICD-10-CM

## 2021-03-14 DIAGNOSIS — H5440 Blindness, one eye, unspecified eye: Secondary | ICD-10-CM

## 2021-03-14 DIAGNOSIS — J9622 Acute and chronic respiratory failure with hypercapnia: Secondary | ICD-10-CM

## 2021-03-14 DIAGNOSIS — W19XXXA Unspecified fall, initial encounter: Secondary | ICD-10-CM

## 2021-03-14 DIAGNOSIS — I10 Essential (primary) hypertension: Secondary | ICD-10-CM

## 2021-03-14 LAB — RENAL FUNCTION PANEL
Albumin: 3.6 g/dL (ref 3.5–5.0)
Anion gap: 8 (ref 5–15)
BUN: 21 mg/dL (ref 8–23)
CO2: 26 mmol/L (ref 22–32)
Calcium: 8.3 mg/dL — ABNORMAL LOW (ref 8.9–10.3)
Chloride: 103 mmol/L (ref 98–111)
Creatinine, Ser: 0.81 mg/dL (ref 0.44–1.00)
GFR, Estimated: >60 mL/min (ref >60)
Glucose, Bld: 116 mg/dL — ABNORMAL HIGH (ref 70–99)
Phosphorus: 3.6 mg/dL (ref 2.5–4.6)
Potassium: 4.7 mmol/L (ref 3.5–5.1)
Sodium: 137 mmol/L (ref 135–145)

## 2021-03-14 LAB — CBC
HCT: 39.4 % (ref 36.0–46.0)
Hemoglobin: 11.1 g/dL — ABNORMAL LOW (ref 12.0–15.0)
MCH: 24.9 pg — ABNORMAL LOW (ref 26.0–34.0)
MCHC: 28.2 g/dL — ABNORMAL LOW (ref 30.0–36.0)
MCV: 88.5 fL (ref 80.0–100.0)
Platelets: 241 10*3/uL (ref 150–400)
RBC: 4.45 MIL/uL (ref 3.87–5.11)
RDW: 15.7 % — ABNORMAL HIGH (ref 11.5–15.5)
WBC: 5.7 10*3/uL (ref 4.0–10.5)
nRBC: 0 % (ref 0.0–0.2)

## 2021-03-14 LAB — CBG MONITORING, ED: Glucose-Capillary: 118 mg/dL — ABNORMAL HIGH (ref 70–99)

## 2021-03-14 LAB — TSH: TSH: 0.344 u[IU]/mL — ABNORMAL LOW (ref 0.350–4.500)

## 2021-03-14 LAB — AMMONIA: Ammonia: 21 umol/L (ref 9–35)

## 2021-03-14 LAB — PROCALCITONIN: Procalcitonin: <0.1 ng/mL

## 2021-03-14 LAB — CK: Total CK: 44 U/L (ref 38–234)

## 2021-03-14 LAB — GLUCOSE, CAPILLARY: Glucose-Capillary: 165 mg/dL — ABNORMAL HIGH (ref 70–99)

## 2021-03-14 LAB — MAGNESIUM: Magnesium: 2.1 mg/dL (ref 1.7–2.4)

## 2021-03-14 MED ORDER — IPRATROPIUM-ALBUTEROL 0.5-2.5 (3) MG/3ML IN SOLN
3.0000 mL | Freq: Three times a day (TID) | RESPIRATORY_TRACT | Status: DC
  Administered 2021-03-15: 3 mL via RESPIRATORY_TRACT

## 2021-03-14 MED ORDER — AZITHROMYCIN 250 MG PO TABS
500.0000 mg | ORAL_TABLET | Freq: Every day | ORAL | Status: DC
  Administered 2021-03-14 – 2021-03-15 (×2): 500 mg via ORAL
  Filled 2021-03-14 (×2): qty 2

## 2021-03-14 NOTE — Assessment & Plan Note (Signed)
RVSP of 20.8 mmHg on TTE on 02/28/2021.

## 2021-03-14 NOTE — Assessment & Plan Note (Signed)
Likely due to respiratory failure/COPD exacerbation.  No obvious injury.  No focal neurodeficits. -PT/OT eval -Fall precaution

## 2021-03-14 NOTE — Progress Notes (Signed)
Patient taken off of BIPAP and placed on 10L nasal cannula. Patient is tolerating well at this time. RN aware. Will continue to monitor as needed.

## 2021-03-14 NOTE — ED Provider Notes (Signed)
Physical Exam  BP 132/83    Pulse 67    Temp 99.1 F (37.3 C) (Oral)    Resp 17    Ht _0  (1.626 m)    Wt 70.8 kg    SpO2 98%    BMI 26.79 kg/m   Physical Exam  Procedures  Procedures  ED Course / MDM    Medical Decision Making Amount and/or Complexity of Data Reviewed Labs: ordered. Radiology: ordered.  Risk Prescription drug management. Decision regarding hospitalization.  Received patient in signout pending call from hospitalist.  COPD exacerbation.  Improved respiratory status clinically but repeat blood gas pending.  Discussed with hospitalist will admit patient.       Davonna Belling, MD 03/14/21 1029

## 2021-03-14 NOTE — Assessment & Plan Note (Signed)
Normotensive for most part. -Continue home meds

## 2021-03-14 NOTE — Assessment & Plan Note (Signed)
Likely due to hypercarbia.  Resolved.

## 2021-03-14 NOTE — ED Notes (Signed)
Attending messaged re: questions about plan to remain on BIPAP, pt A&O x4 at this time

## 2021-03-14 NOTE — Progress Notes (Signed)
Patient is currently on [place] HFNC at 3L with sat of 91%.  Bipap is not needed at this time, will continue to monitor.

## 2021-03-14 NOTE — Assessment & Plan Note (Addendum)
Likely due to COPD exacerbation with ongoing tobacco use.  Reportedly desaturated to 59% on RA.  VBG7.2/70/50/22 suggesting acute respiratory acidosis.  Required BiPAP.  Now on 4 L.  She is on 2 L at home but does not use consistently.  -COPD pathway with systemic steroid, scheduled and as needed DuoNebs -Change ceftriaxone to azithromycin. -Encourage tobacco cessation. -Nicotine patch -Minimal oxygen to keep saturation above 88% -Incentive spirometry/OOB/PT/OT

## 2021-03-14 NOTE — Progress Notes (Signed)
Walked in to give patient breathing treatment, patient had O2 off and sat was at 75%.  Placed back on patient and sat came up to 91%.

## 2021-03-14 NOTE — Hospital Course (Signed)
81 year old F with PMH of COPD/chronic hypoxic RF on 2 L, HTN, ongoing tobacco use and HFpEF presenting with altered mental status, fall at home, cough and congestion, and admitted for acute respiratory failure with hypoxia and hypercapnia in the setting of COPD exacerbation and ongoing tobacco use.  Desaturated to 59% on RA requiring 6 L.  VBG with acute respiratory acidosis.  Initially started on BiPAP, CAP coverage, systemic steroid and nebulizers and admitted.  The next day, encephalopathy resolved.  Respiratory status improved.  She was weaned to 4 L by [place].

## 2021-03-14 NOTE — ED Notes (Signed)
Received message from attending. Will attempt to wean from BIPAP. Respiratory notified and to come to bedside

## 2021-03-14 NOTE — Assessment & Plan Note (Deleted)
See above

## 2021-03-14 NOTE — Progress Notes (Signed)
PROGRESS NOTE  Marisa Bailey ION:[id number] DOB: [birth year]   PCP: [name], MD  Patient is from: Home.  Independently ambulates at baseline.  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  Desaturated to 59% on RA requiring 6 L.  VBG with acute respiratory acidosis.  Initially started on BiPAP, CAP coverage, systemic steroid and nebulizers and admitted.  The next day, encephalopathy resolved.  Respiratory status improved.  She was weaned to 4 L by [place].   Subjective: Seen and examined earlier this morning.  No major events overnight of this morning.  Weaned to 4 L by nasal cannula.  Reports improvement in her breathing.  Continues to endorse productive cough with small whitish phlegm.  She denies hemoptysis.  She denies chest pain, GI or UTI symptoms.  Objective: Vitals:   03/14/21 1113 03/14/21 1115 03/14/21 1143 03/14/21 1339  BP:  128/61 (!) 130/54   Pulse: 74 72 77   Resp: (!) 25 (!) 25 (!) 21   Temp:   98.6 F (37 C)   TempSrc:   Oral   SpO2: 94% 98% 97% 91%  Weight:      Height:        Examination:  GENERAL: No apparent distress.  Nontoxic. HEENT: MMM.  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 NECK: Supple.  No apparent JVD.  RESP: 90% on 4 L at rest.  No IWOB.  Fair aeration bilaterally.  Rhonchi bilaterally. CVS:  RRR. Heart sounds normal.  ABD/GI/GU: BS+. Abd soft, NTND.  MSK/EXT:  Moves extremities. No apparent deformity. No edema.  SKIN: no apparent skin lesion or wound NEURO: Awake, alert and oriented appropriately.  No apparent focal neuro deficit. PSYCH: Calm. Normal affect.    Procedures:  None  Microbiology summarized: [id number] and influenza PCR nonreactive.  Assessment and Plan: * Acute on chronic respiratory failure with hypoxia and hypercapnia ([place])- (present on admission) Likely due to COPD exacerbation with ongoing tobacco use.  Reportedly desaturated to 59% on RA.  VBG7.2/70/50/22 suggesting acute respiratory acidosis.  Required BiPAP.  Now on 4 L.  She is on 2 L at home but does not use consistently.  -COPD pathway with systemic steroid, scheduled and as needed DuoNebs -Change ceftriaxone to azithromycin. -Encourage tobacco cessation. -Nicotine patch -Minimal oxygen to keep saturation above 88% -Incentive spirometry/OOB/PT/OT  Acute metabolic encephalopathy- (present on admission) Likely due to hypercarbia.  Resolved.   Accidental fall- (present on admission) Likely due to respiratory failure/COPD exacerbation.  No obvious injury.  No focal neurodeficits. -PT/OT eval -Fall precaution  Essential hypertension- (present on admission) Normotensive for most part. 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         Body mass index is 26.79 kg/m.         DVT prophylaxis:  enoxaparin (LOVENOX) injection 40 mg Start: 03/13/21 2245  Code Status: Full code Family Communication: Patient and/or RN. Available if any question.  Level of care: Med-Surg Status is: Inpatient Remains inpatient appropriate because: Acute respiratory failure with hypoxia and hypercapnia due  to COPD exacerbation requiring IV steroid and nebulizers.  Planned Discharge Destination: Home         Consultants:  None   Sch Meds:  Scheduled Meds:  amLODipine  10 mg Oral Daily   aspirin EC  81 mg Oral Q breakfast   atorvastatin  20 mg Oral Daily   azithromycin  500 mg Oral Daily   enoxaparin (LOVENOX) injection  40 mg Subcutaneous Q24H   guaiFENesin-dextromethorphan  5 mL  Oral Q8H   ipratropium-albuterol  3 mL Nebulization Q6H   irbesartan  300 mg Oral Daily   methylPREDNISolone (SOLU-MEDROL) injection  60 mg Intravenous Q12H   Followed by   Derrill Memo ON 03/15/2021] predniSONE  40 mg Oral Q breakfast   nicotine  14 mg Transdermal Daily   potassium chloride  40 mEq Oral Once   Continuous Infusions: PRN Meds:.acetaminophen **OR** acetaminophen, ipratropium-albuterol, ondansetron **OR** ondansetron (ZOFRAN) IV, polyethylene glycol  Antimicrobials: Anti-infectives (From admission, onward)    Start     Dose/Rate Route Frequency Ordered Stop   03/14/21 1500  cefTRIAXone (ROCEPHIN) 1 g in sodium chloride 0.9 % 100 mL IVPB  Status:  Discontinued        1 g 200 mL/hr over 30 Minutes Intravenous Every 24 hours 03/13/21 2233 03/14/21 1410   03/14/21 1500  azithromycin (ZITHROMAX) tablet 500 mg        500 mg Oral Daily 03/14/21 1412 03/18/21 0959   03/13/21 1500  cefTRIAXone (ROCEPHIN) 1 g in sodium chloride 0.9 % 100 mL IVPB        1 g 200 mL/hr over 30 Minutes Intravenous  Once 03/13/21 1445 03/13/21 1536   03/13/21 1500  azithromycin (ZITHROMAX) tablet 500 mg        500 mg Oral  Once 03/13/21 1445 03/13/21 1456        I have personally reviewed the following labs and images: CBC: Recent Labs  Lab 03/13/21 1445 03/14/21 0854  WBC 6.8 5.7  NEUTROABS 4.5  --   HGB 12.6 11.1*  HCT 42.2 39.4  MCV 88.7 88.5  PLT 278 241   BMP &GFR Recent Labs  Lab 03/13/21 1445 03/14/21 0854  NA 137 137  K 4.7 4.7  CL 104 103  CO2 27 26  GLUCOSE 105* 116*  BUN 12 21  CREATININE 0.69 0.81  CALCIUM 8.5* 8.3*  MG  --  2.1  PHOS  --  3.6   Estimated Creatinine Clearance: 53.4 mL/min (by C-G formula based on SCr of 0.81 mg/dL). Liver & Pancreas: Recent Labs  Lab 03/13/21 1445 03/14/21 0854  AST 17  --   ALT 12  --   ALKPHOS 76  --   BILITOT 0.6  --   PROT 8.1  --   ALBUMIN 4.0 3.6   No results for input(s): LIPASE, AMYLASE in the last 168 hours. Recent  Labs  Lab 03/14/21 0854  AMMONIA 21   Diabetic: No results for input(s): HGBA1C in the last 72 hours. Recent Labs  Lab 03/13/21 1434 03/14/21 0726 03/14/21 1209  GLUCAP 101* 118* 165*   Cardiac Enzymes: Recent Labs  Lab 03/14/21 0854  CKTOTAL 44   No results for input(s): PROBNP in the last 8760 hours. Coagulation Profile: No results for input(s): INR, PROTIME in the last 168 hours. Thyroid Function Tests: Recent Labs    03/14/21 0854  TSH 0.344*   Lipid Profile: No results for input(s): CHOL, HDL, LDLCALC, TRIG, CHOLHDL, LDLDIRECT in the last 72 hours. Anemia Panel:  No results for input(s): VITAMINB12, FOLATE, FERRITIN, TIBC, IRON, RETICCTPCT in the last 72 hours. Urine analysis:    Component Value Date/Time   COLORURINE YELLOW 11/23/2019 1240   APPEARANCEUR HAZY (A) 11/23/2019 1240   LABSPEC 1.019 11/23/2019 1240   PHURINE 5.0 11/23/2019 1240   GLUCOSEU NEGATIVE 11/23/2019 1240   HGBUR MODERATE (A) 11/23/2019 1240   BILIRUBINUR NEGATIVE 11/23/2019 [place] 11/23/2019 1240   PROTEINUR NEGATIVE 11/23/2019 1240   NITRITE NEGATIVE 11/23/2019 1240   LEUKOCYTESUR NEGATIVE 11/23/2019 1240   Sepsis Labs: Invalid input(s): PROCALCITONIN, [place]  Microbiology: Recent Results (from the past 240 hour(s))  Resp Panel by RT-PCR (Flu A&B, Covid) Nasopharyngeal Swab     Status: None   Collection Time: 03/13/21  2:45 PM   Specimen: Nasopharyngeal Swab; Nasopharyngeal(NP) swabs in vial transport medium  Result Value Ref Range Status   SARS Coronavirus 2 by RT PCR NEGATIVE NEGATIVE Final    Comment: (NOTE) SARS-CoV-2 target nucleic acids are NOT DETECTED.  The SARS-CoV-2 RNA is generally detectable in upper respiratory specimens during the acute phase of infection. The lowest concentration of SARS-CoV-2 viral copies this assay can detect is 138 copies/mL. A negative result does not preclude SARS-Cov-2 infection and should not be used as the sole  basis for treatment or other patient management decisions. A negative result may occur with  improper specimen collection/handling, submission of specimen other than nasopharyngeal swab, presence of viral mutation(s) within the areas targeted by this assay, and inadequate number of viral copies(<138 copies/mL). A negative result must be combined with clinical observations, patient history, and epidemiological information. The expected result is Negative.  Fact Sheet for Patients:  EntrepreneurPulse.com.au  Fact Sheet for Healthcare Providers:  IncredibleEmployment.be  This test is no t yet approved or cleared by the [place] FDA and  has been authorized for detection and/or diagnosis of SARS-CoV-2 by FDA under an Emergency Use Authorization (EUA). This EUA will remain  in effect (meaning this test can be used) for the duration of the COVID-19 declaration under Section 564(b)(1) of the Act, 21 U.S.C.section 360bbb-3(b)(1), unless the authorization is terminated  or revoked sooner.       Influenza A by PCR NEGATIVE NEGATIVE Final   Influenza B by PCR NEGATIVE NEGATIVE Final    Comment: (NOTE) The Xpert Xpress SARS-CoV-2/FLU/RSV plus assay is intended as an aid in the diagnosis of influenza from Nasopharyngeal swab specimens and should not be used as a sole basis for treatment. Nasal washings and aspirates are unacceptable for Xpert Xpress SARS-CoV-2/FLU/RSV testing.  Fact Sheet for Patients: EntrepreneurPulse.com.au  Fact Sheet for Healthcare Providers: IncredibleEmployment.be  This test is not yet approved or cleared by the [place] FDA and has been authorized for detection and/or diagnosis of SARS-CoV-2 by FDA under an Emergency Use Authorization (EUA). This EUA will remain in effect (meaning this test can be used) for the duration of the COVID-19 declaration under Section 564(b)(1) of the Act,  21 U.S.C. section 360bbb-3(b)(1), unless the authorization is terminated or revoked.  Performed at [facility], [address], [place], [place] [address]   Expectorated Sputum Assessment w Gram Stain, Rflx to Resp Cult     Status: None   Collection Time: 03/13/21  2:45 PM   Specimen: Sputum  Result Value Ref Range Status   Specimen Description SPUTUM  Final   Special Requests NONE  Final   Sputum evaluation   Final    THIS SPECIMEN IS ACCEPTABLE FOR SPUTUM CULTURE Performed  at [facility], [address], [place], [place] [address]    Report Status 03/13/2021 FINAL  Final  Culture, Respiratory w Gram Stain     Status: None (Preliminary result)   Collection Time: 03/13/21  2:45 PM   Specimen: SPU  Result Value Ref Range Status   Specimen Description   Final    SPUTUM Performed at [facility], [address], [place], [place] [address]    Special Requests   Final    NONE Reflexed from [id number] Performed at [facility], [address] [place], [place] [address]    Gram Stain   Final    NO SQUAMOUS EPITHELIAL CELLS SEEN FEW WBC SEEN MODERATE GRAM POSITIVE COCCI    Culture   Final    TOO YOUNG TO READ Performed at [facility], [place] [address] [place], [place] [address]    Report Status PENDING  Incomplete    Radiology Studies: DG Chest Port 1 View  Result Date: 03/13/2021 CLINICAL DATA:  Cough, congestion EXAM: PORTABLE CHEST 1 VIEW COMPARISON:  01/18/2021 FINDINGS: Bilateral lower lobe interstitial thickening. No focal consolidation. No pleural effusion or pneumothorax. Stable cardiomegaly. No acute osseous abnormality. 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 Superimposed infection cannot be excluded. Electronically Signed   By: Kathreen Devoid M.D.   On: 03/13/2021 15:01      Cherity Blickenstaff T[address] [place]  If 7PM-7AM, please contact night-coverage [email or web address] 03/14/2021, 2:35 PM

## 2021-03-15 DIAGNOSIS — I2723 Pulmonary hypertension due to lung diseases and hypoxia: Secondary | ICD-10-CM

## 2021-03-15 DIAGNOSIS — J449 Chronic obstructive pulmonary disease, unspecified: Secondary | ICD-10-CM

## 2021-03-15 LAB — GLUCOSE, CAPILLARY: Glucose-Capillary: 127 mg/dL — ABNORMAL HIGH (ref 70–99)

## 2021-03-15 LAB — PROCALCITONIN: Procalcitonin: <0.1 ng/mL

## 2021-03-15 MED ORDER — ALBUTEROL SULFATE HFA 108 (90 BASE) MCG/ACT IN AERS: 2.0000 | INHALATION_SPRAY | Freq: Four times a day (QID) | RESPIRATORY_TRACT | 2 refills | Status: DC | PRN

## 2021-03-15 MED ORDER — UMECLIDINIUM-VILANTEROL 62.5-25 MCG/ACT IN AEPB
1.0000 | INHALATION_SPRAY | Freq: Every day | RESPIRATORY_TRACT | 1 refills | Status: DC
Start: 2021-03-15 — End: 2021-08-15

## 2021-03-15 MED ORDER — NICOTINE 21 MG/24HR TD PT24: 21.0000 mg | MEDICATED_PATCH | Freq: Every day | TRANSDERMAL | 1 refills | Status: DC

## 2021-03-15 MED ORDER — AZITHROMYCIN 500 MG PO TABS: 500.0000 mg | ORAL_TABLET | Freq: Every day | ORAL | 0 refills | Status: AC

## 2021-03-15 MED ORDER — ALBUTEROL SULFATE (2.5 MG/3ML) 0.083% IN NEBU
2.5000 mg | INHALATION_SOLUTION | Freq: Four times a day (QID) | RESPIRATORY_TRACT | 1 refills | Status: DC | PRN
Start: 2021-03-15 — End: 2021-10-27

## 2021-03-15 MED ORDER — PREDNISONE 20 MG PO TABS: 40.0000 mg | ORAL_TABLET | Freq: Every day | ORAL | 0 refills | Status: AC

## 2021-03-15 MED ORDER — ORAL CARE MOUTH RINSE
15.0000 mL | Freq: Two times a day (BID) | OROMUCOSAL | Status: DC
  Administered 2021-03-15: 15 mL via OROMUCOSAL

## 2021-03-15 NOTE — Discharge Summary (Signed)
Physician Discharge Summary  Marisa Bailey BMW:[id number] DOB: 1940-06-14 DOA: 03/13/2021  PCP: [name], MD  Admit date: 03/13/2021 Discharge date: 03/15/2021 Admitted From: Home. Disposition: Home Recommendations for Outpatient Follow-up:  Follow ups as below. Please obtain CBC/BMP/Mag at follow up Please follow up on the following pending results: None Home Health: Not indicated Equipment/Devices: Patient has home oxygen Discharge Condition: Stable CODE STATUS: Full code  Follow-up Information     [name], MD. Schedule an appointment as soon as possible for a visit in 1 week(s).   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  Desaturated to 59% on RA requiring 6 L.  VBG with acute respiratory acidosis.  Initially started on BiPAP, CAP coverage, systemic steroid and nebulizers and admitted.   The next day, encephalopathy resolved.  Respiratory status improved.  Continued on systemic steroid, Zithromax and breathing treatments.  On the day of discharge, patient felt well and ready to go home.  Required 3 L by [place] with ambulation.  She is discharged on p.o. prednisone, and Zithromax for 3 more days.  Started on [employer].  Strongly encouraged to quit smoking cigarettes.   Outpatient follow-up as above.   See individual problem list below for more on hospital course.  Discharge Diagnoses:  Acute on chronic respiratory failure with hypoxia and hypercapnia due to COPD exacerbation with ongoing cigarette smoking:  Reportedly desaturated to 59% on RA.  VBG7.2/70/50/22  suggesting acute respiratory acidosis.  Required BiPAP on admission but weaned to nasal cannula.  Required up to 3 L with ambulation on the day of discharge. -See above for plan.   Acute metabolic encephalopathy: POA.  Likely due to hypercarbia.  Resolved.   Accidental fall: POA.  Likely due to the above.  No injury or focal neurodeficit.  No need identified by PT/OT   Essential hypertension: Normotensive for most part. 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 RVSP of 20.8 mmHg on TTE on 02/28/2021.  Tobacco use disorder -Encouraged tobacco cessation -Encouraged to use nicotine patch as needed  Body mass index is 26.79 kg/m.           Discharge Exam: Vitals:   03/14/21 2039 03/14/21 2108 03/15/21 0532 03/15/21 0945  BP:  (!) 131/58 (!) 141/55   Pulse:  81 60   Temp:  98 F (36.7 C) 98.3 F (36.8 C)   Resp:  20 19   Height:      Weight:      SpO2: 91% 95% 95% 100%  TempSrc:  Oral Oral   BMI (Calculated):         GENERAL: No apparent distress.  Nontoxic. HEENT: MMM.  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 NECK: Supple.  No apparent JVD.  RESP: 95% on 2 L at rest.  No IWOB.  Fair aeration bilaterally. CVS:  RRR. Heart sounds normal.  ABD/GI/GU: Bowel sounds present. Soft. Non tender.  MSK/EXT:  Moves extremities. No apparent deformity. No edema.  SKIN: no apparent skin lesion or wound NEURO: Awake and alert.  Oriented appropriately.  No apparent  focal neuro deficit. PSYCH: Calm. Normal affect.   Discharge Instructions  Discharge Instructions     Call MD for:  difficulty breathing, headache or visual disturbances   Complete by: As directed    Call MD for:  extreme fatigue   Complete by: As directed    Diet - low sodium heart healthy   Complete by: As directed    Discharge instructions   Complete by: As directed    It has been a pleasure taking care of you!  You were hospitalized due to COPD exacerbation likely from smoking cigarettes.  Your symptoms  improved with treatment.  We are discharging you more steroid to complete treatment course.  We have started you on new inhalers as well.  Please review your new medication list and the directions on your medications before you take them.  We strongly recommend you quit smoking cigarettes.  Follow-up with your primary care doctor in 1 to 2 weeks or sooner if needed.  It is important that you quit smoking cigarettes.  You may use nicotine patch to help you quit smoking.  Nicotine patch is available over-the-counter.  You may also discuss other options to help you quit smoking with your primary care doctor. You can also talk to professional counselors at 1-800-QUIT-NOW [phone]) for free smoking cessation counseling.     Take care,   Increase activity slowly   Complete by: As directed       Allergies as of 03/15/2021       Reactions   Penicillins Rash   Has patient had a PCN reaction causing immediate rash, facial/tongue/throat swelling, SOB or lightheadedness with hypotension: {Yes/No:[id number] Has patient had a PCN reaction causing severe rash involving mucus membranes or skin necrosis: Yes Has patient had a PCN reaction that required hospitalization No Has patient had a PCN reaction occurring within the last 10 years: No If all of the above answers are "NO", then may proceed with Cephalosporin use. **Has tolerated keflex        Medication List     STOP taking these medications    Norel AD 4-10-325 MG Tabs Generic drug: Chlorphen-PE-Acetaminophen       TAKE these medications    albuterol (2.5 MG/3ML) 0.083% nebulizer solution Commonly known as: PROVENTIL Take 3 mLs (2.5 mg total) by nebulization every 6 (six) hours as needed for wheezing or shortness of breath.   albuterol 108 (90 Base) MCG/ACT inhaler Commonly known as: VENTOLIN HFA Inhale 2 puffs into the lungs every 6 (six) hours as needed for wheezing or shortness of breath.   amLODipine 10 MG tablet Commonly  known as: NORVASC Take 1 tablet (10 mg total) by mouth daily.   aspirin EC 81 MG tablet Take 1 tablet (81 mg total) by mouth daily with breakfast.   atorvastatin 20 MG tablet Commonly known as: LIPITOR Take 1 tablet (20 mg total) by mouth daily.   azithromycin 500 MG tablet Commonly known as: Zithromax Take 1 tablet (500 mg total) by mouth daily for 2 days. Take 1 tablet daily for 3 days. Start taking on: March 16, 2021   Caltrate 600+D Plus Minerals 600-800 MG-UNIT Tabs Take 1 tablet by mouth 2 (two) times daily.   EPINEPHrine 0.3 mg/0.3 mL Soaj injection Commonly known as: EPI-PEN Inject 0.3 mg into the muscle as needed for anaphylaxis.   folic acid 1 MG tablet Commonly known as: FOLVITE Take 1 tablet (1 mg total) by mouth daily.   meloxicam 7.5 MG tablet Commonly known  as: MOBIC Take 7.5 mg by mouth daily.   metFORMIN 500 MG tablet Commonly known as: GLUCOPHAGE Take 1 tablet (500 mg total) by mouth 2 (two) times daily.   nicotine 21 mg/24hr patch Commonly known as: NICODERM CQ - dosed in mg/24 hours Place 1 patch (21 mg total) onto the skin daily. Start taking on: March 16, 2021   olmesartan 40 MG tablet Commonly known as: BENICAR Take 1 tablet (40 mg total) by mouth daily.   predniSONE 20 MG tablet Commonly known as: DELTASONE Take 2 tablets (40 mg total) by mouth daily with breakfast for 3 days. Start taking on: March 16, 2021   umeclidinium-vilanterol 62.5-25 MCG/ACT Aepb Commonly known as: ANORO ELLIPTA Inhale 1 puff into the lungs daily.   Vitamin D3 25 MCG (1000 UT) Caps Take 2 capsules by mouth daily.   Zinc 50 MG Caps Take 1 capsule by mouth daily.        Consultations: None  Procedures/Studies:   DG Chest Port 1 View  Result Date: 03/13/2021 CLINICAL DATA:  Cough, congestion EXAM: PORTABLE CHEST 1 VIEW COMPARISON:  01/18/2021 FINDINGS: Bilateral lower lobe interstitial thickening. No focal consolidation. No pleural effusion or  pneumothorax. Stable cardiomegaly. No acute osseous abnormality. 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 Superimposed infection cannot be excluded. Electronically Signed   By: Kathreen Devoid M.D.   On: 03/13/2021 15:01   ECHOCARDIOGRAM COMPLETE  Result Date: 02/28/2021    ECHOCARDIOGRAM REPORT   Patient Name:   Marisa Bailey Date of Exam: 02/28/2021 Medical Rec #:  [id number]      Height:       64.0 in Accession #:    [id number]     Weight:       156.4 lb Date of Birth:  15-Jun-1940       BSA:          1.762 m Patient Age:    81 years       BP:           136/68 mmHg Patient Gender: F              HR:           79 bpm. Exam Location:  [facility] Procedure: 2D Echo, Cardiac Doppler and Color Doppler Indications:    I27.20 (ICD-10-CM) - Pulmonary HTN  History:        Patient has prior history of Echocardiogram examinations, most                 recent 05/26/2019. CHF, COPD; Risk Factors:Hypertension, Current                 Smoker and Dyslipidemia.  Sonographer:    Alvino Chapel RCS Referring Phys: [id number] [place]  1. Left ventricular ejection fraction, by estimation, is 60 to 65%. The left ventricle has normal function. The left ventricle has no regional wall motion abnormalities. There is mild left ventricular hypertrophy. Left ventricular diastolic parameters are indeterminate.  2. Right ventricular systolic function is normal. The right ventricular size is normal. There is normal pulmonary artery systolic pressure. The estimated right ventricular systolic pressure is 70.9 mmHg.  3. Left atrial size was mildly dilated.  4. The mitral valve is normal in structure. Mild mitral valve regurgitation. No evidence of mitral stenosis.  5. The aortic valve is tricuspid. Aortic valve regurgitation is not visualized. Aortic valve sclerosis is present, with no evidence of aortic valve  stenosis.  6. The inferior vena cava is normal  in size with greater than 50% respiratory variability, suggesting right atrial pressure of 3 mmHg. FINDINGS  Left Ventricle: Left ventricular ejection fraction, by estimation, is 60 to 65%. The left ventricle has normal function. The left ventricle has no regional wall motion abnormalities. The left ventricular internal cavity size was normal in size. There is  mild left ventricular hypertrophy. Left ventricular diastolic parameters are indeterminate. Right Ventricle: The right ventricular size is normal. No increase in right ventricular wall thickness. Right ventricular systolic function is normal. There is normal pulmonary artery systolic pressure. The tricuspid regurgitant velocity is 2.11 m/s, and  with an assumed right atrial pressure of 3 mmHg, the estimated right ventricular systolic pressure is 82.9 mmHg. Left Atrium: Left atrial size was mildly dilated. Right Atrium: Right atrial size was normal in size. Pericardium: Trivial pericardial effusion is present. Mitral Valve: The mitral valve is normal in structure. Mild mitral valve regurgitation. No evidence of mitral valve stenosis. Tricuspid Valve: The tricuspid valve is normal in structure. Tricuspid valve regurgitation is trivial. Aortic Valve: The aortic valve is tricuspid. Aortic valve regurgitation is not visualized. Aortic valve sclerosis is present, with no evidence of aortic valve stenosis. Pulmonic Valve: The pulmonic valve was not well visualized. Pulmonic valve regurgitation is not visualized. Aorta: The aortic root is normal in size and structure. Venous: The inferior vena cava is normal in size with greater than 50% respiratory variability, suggesting right atrial pressure of 3 mmHg. IAS/Shunts: The interatrial septum was not well visualized.  LEFT VENTRICLE PLAX 2D LVIDd:         4.90 cm   Diastology LVIDs:         3.20 cm   LV e' medial:    5.77 cm/s LV PW:         1.10 cm   LV E/e' medial:  14.2 LV IVS:        0.90 cm   LV e' lateral:   8.16  cm/s LVOT diam:     1.80 cm   LV E/e' lateral: 10.1 LV SV:         70 LV SV Index:   40 LVOT Area:     2.54 cm  RIGHT VENTRICLE RV S prime:     12.80 cm/s TAPSE (M-mode): 2.0 cm LEFT ATRIUM             Index        RIGHT ATRIUM           Index LA diam:        4.20 cm 2.38 cm/m   RA Area:     14.90 cm LA Vol (A2C):   79.8 ml 45.28 ml/m  RA Volume:   32.40 ml  18.39 ml/m LA Vol (A4C):   54.4 ml 30.87 ml/m LA Biplane Vol: 66.5 ml 37.74 ml/m  AORTIC VALVE LVOT Vmax:   118.00 cm/s LVOT Vmean:  79.700 cm/s LVOT VTI:    0.277 m  AORTA Ao Root diam: 3.50 cm MITRAL VALVE                TRICUSPID VALVE MV Area (PHT): 3.21 cm     TR Peak grad:   17.8 mmHg MV Decel Time: 236 msec     TR Vmax:        211.00 cm/s MV E velocity: 82.10 cm/s MV A velocity: 102.00 cm/s  SHUNTS MV E/A ratio:  0.80  Systemic VTI:  0.28 m                             Systemic Diam: 1.80 cm Oswaldo Milian MD Electronically signed by Oswaldo Milian MD Signature Date/Time: 02/28/2021/2:43:15 PM    Final        The results of significant diagnostics from this hospitalization (including imaging, microbiology, ancillary and laboratory) are listed below for reference.     Microbiology: Recent Results (from the past 240 hour(s))  Resp Panel by RT-PCR (Flu A&B, Covid) Nasopharyngeal Swab     Status: None   Collection Time: 03/13/21  2:45 PM   Specimen: Nasopharyngeal Swab; Nasopharyngeal(NP) swabs in vial transport medium  Result Value Ref Range Status   SARS Coronavirus 2 by RT PCR NEGATIVE NEGATIVE Final    Comment: (NOTE) SARS-CoV-2 target nucleic acids are NOT DETECTED.  The SARS-CoV-2 RNA is generally detectable in upper respiratory specimens during the acute phase of infection. The lowest concentration of SARS-CoV-2 viral copies this assay can detect is 138 copies/mL. A negative result does not preclude SARS-Cov-2 infection and should not be used as the sole basis for treatment or other patient management  decisions. A negative result may occur with  improper specimen collection/handling, submission of specimen other than nasopharyngeal swab, presence of viral mutation(s) within the areas targeted by this assay, and inadequate number of viral copies(<138 copies/mL). A negative result must be combined with clinical observations, patient history, and epidemiological information. The expected result is Negative.  Fact Sheet for Patients:  EntrepreneurPulse.com.au  Fact Sheet for Healthcare Providers:  IncredibleEmployment.be  This test is no t yet approved or cleared by the [place] FDA and  has been authorized for detection and/or diagnosis of SARS-CoV-2 by FDA under an Emergency Use Authorization (EUA). This EUA will remain  in effect (meaning this test can be used) for the duration of the COVID-19 declaration under Section 564(b)(1) of the Act, 21 U.S.C.section 360bbb-3(b)(1), unless the authorization is terminated  or revoked sooner.       Influenza A by PCR NEGATIVE NEGATIVE Final   Influenza B by PCR NEGATIVE NEGATIVE Final    Comment: (NOTE) The Xpert Xpress SARS-CoV-2/FLU/RSV plus assay is intended as an aid in the diagnosis of influenza from Nasopharyngeal swab specimens and should not be used as a sole basis for treatment. Nasal washings and aspirates are unacceptable for Xpert Xpress SARS-CoV-2/FLU/RSV testing.  Fact Sheet for Patients: EntrepreneurPulse.com.au  Fact Sheet for Healthcare Providers: IncredibleEmployment.be  This test is not yet approved or cleared by the [place] FDA and has been authorized for detection and/or diagnosis of SARS-CoV-2 by FDA under an Emergency Use Authorization (EUA). This EUA will remain in effect (meaning this test can be used) for the duration of the COVID-19 declaration under Section 564(b)(1) of the Act, 21 U.S.C. section 360bbb-3(b)(1), unless the  authorization is terminated or revoked.  Performed at [facility], [address], [place], [place] [address]   Expectorated Sputum Assessment w Gram Stain, Rflx to Resp Cult     Status: None   Collection Time: 03/13/21  2:45 PM   Specimen: Sputum  Result Value Ref Range Status   Specimen Description SPUTUM  Final   Special Requests NONE  Final   Sputum evaluation   Final    THIS SPECIMEN IS ACCEPTABLE FOR SPUTUM CULTURE Performed at [facility], [address] [place],  [address]    Report Status 03/13/2021  FINAL  Final  Culture, Respiratory w Gram Stain     Status: None (Preliminary result)   Collection Time: 03/13/21  2:45 PM   Specimen: SPU  Result Value Ref Range Status   Specimen Description   Final    SPUTUM Performed at [facility], [address], [place], [place] [address]    Special Requests   Final    NONE Reflexed from [id number] Performed at [facility], [address] [place], [place] [address]    Gram Stain   Final    NO SQUAMOUS EPITHELIAL CELLS SEEN FEW WBC SEEN MODERATE GRAM POSITIVE COCCI    Culture   Final    MODERATE STAPHYLOCOCCUS AUREUS SUSCEPTIBILITIES TO FOLLOW Performed at [facility], [place] [address] [place], [place] [address]    Report Status PENDING  Incomplete     Labs:  CBC: Recent Labs  Lab 03/13/21 1445 03/14/21 0854  WBC 6.8 5.7  NEUTROABS 4.5  --   HGB 12.6 11.1*  HCT 42.2 39.4  MCV 88.7 88.5  PLT 278 241   BMP &GFR Recent Labs  Lab 03/13/21 1445 03/14/21 0854  NA 137 137  K 4.7 4.7  CL 104 103  CO2 27 26  GLUCOSE 105* 116*  BUN 12 21  CREATININE 0.69 0.81  CALCIUM 8.5* 8.3*  MG  --  2.1  PHOS  --  3.6   Estimated Creatinine Clearance: 53.4 mL/min (by C-G formula based on SCr of 0.81 mg/dL). Liver & Pancreas: Recent Labs  Lab 03/13/21 1445 03/14/21 0854  AST 17  --   ALT 12  --   ALKPHOS 76  --   BILITOT 0.6  --   PROT 8.1  --   ALBUMIN 4.0 3.6   No results for input(s): LIPASE, AMYLASE  in the last 168 hours. Recent Labs  Lab 03/14/21 0854  AMMONIA 21   Diabetic: No results for input(s): HGBA1C in the last 72 hours. Recent Labs  Lab 03/13/21 1434 03/14/21 0726 03/14/21 1209 03/15/21 0623  GLUCAP 101* 118* 165* 127*   Cardiac Enzymes: Recent Labs  Lab 03/14/21 0854  CKTOTAL 44   No results for input(s): PROBNP in the last 8760 hours. Coagulation Profile: No results for input(s): INR, PROTIME in the last 168 hours. Thyroid Function Tests: Recent Labs    03/14/21 0854  TSH 0.344*   Lipid Profile: No results for input(s): CHOL, HDL, LDLCALC, TRIG, CHOLHDL, LDLDIRECT in the last 72 hours. Anemia Panel: No results for input(s): VITAMINB12, FOLATE, FERRITIN, TIBC, IRON, RETICCTPCT in the last 72 hours. Urine analysis:    Component Value Date/Time   COLORURINE YELLOW 11/23/2019 1240   APPEARANCEUR HAZY (A) 11/23/2019 1240   LABSPEC 1.019 11/23/2019 1240   PHURINE 5.0 11/23/2019 1240   GLUCOSEU NEGATIVE 11/23/2019 1240   HGBUR MODERATE (A) 11/23/2019 1240   BILIRUBINUR NEGATIVE 11/23/2019 [place] 11/23/2019 1240   PROTEINUR NEGATIVE 11/23/2019 1240   NITRITE NEGATIVE 11/23/2019 1240   LEUKOCYTESUR NEGATIVE 11/23/2019 1240   Sepsis Labs: Invalid input(s): PROCALCITONIN, LACTICIDVEN   Time coordinating discharge: 45 minutes  SIGNED:  Mercy Riding, MD  [facility] 03/15/2021, 4:22 PM

## 2021-03-15 NOTE — Evaluation (Signed)
Physical Therapy Evaluation Patient Details Name: Marisa Bailey MRN: [id number] DOB: [birth year] Today's Date: 03/15/2021  History of Present Illness  Marisa Bailey is a 81 y.o. female with medical history significant for COPD, chronic respiratory failure, hypertension, current tobacco use.  Patient presented to the ED with complaints of cough, congestion and confusion.  The time of my evaluation patient is awake alert oriented and able to tell me her name and answer a few questions, but she is still slightly confused.  Patient's niece is at [place], works in Retail buyer and processing limits here at [employer].  She confirms above history.  Patient is supposed to be on home O2, she does not use it.  She reports that patient speech was not making sense, it also appeared she did not understand when family was talking to her.  Yesterday patient was fine, but, confusion was noted today.  Per niece at bedside, patient also fell today, fall was witnessed, patient fell on her buttock.  She did not hit her head.   Clinical Impression  Patient functioning at baseline for functional mobility and gait demonstrating good return for ambulating in room and hallways without AD and no loss of balance.  Patient ambulated on 2 LPM with SpO2 dropping from 95% to 85% and put back on 3 LPM with SpO2 increasing to 93% while resting at bedside.  Plan:  Patient discharged from physical therapy to care of nursing for ambulation daily as tolerated for length of stay.         Recommendations for follow up therapy are one component of a multi-disciplinary discharge planning process, led by the attending physician.  Recommendations may be updated based on patient status, additional functional criteria and insurance authorization.  Follow Up Recommendations No PT follow up    Assistance Recommended at Discharge PRN  Patient can return home with the following  Other (comment) (at baseline)    Equipment Recommendations  None recommended by PT  Recommendations for Other Services       Functional Status Assessment       Precautions / Restrictions Precautions Precautions: None Restrictions Weight Bearing Restrictions: No      Mobility  Bed Mobility Overal bed mobility: Modified Independent                  Transfers Overall transfer level: Modified independent                      Ambulation/Gait Ambulation/Gait assistance: Modified independent (Device/Increase time) Gait Distance (Feet): 200 Feet Assistive device: None Gait Pattern/deviations: WFL(Within Functional Limits) Gait velocity: normal     General Gait Details: demonstrates good return for ambulation in room and hallways without loss of balance, on 2 LPM with SpO2 dropping from 95% to 85%, increased to 3 LPM to return to room  Stairs            Wheelchair Mobility    Modified Rankin (Stroke Patients Only)       Balance Overall balance assessment: Modified Independent                                           Pertinent Vitals/Pain Pain Assessment Pain Assessment: No/denies pain    Home Living Family/patient expects to be discharged to:: Private residence Living Arrangements: Alone Available Help at Discharge: Family;Available PRN/intermittently Type of Home: Mobile home  Home Access: Stairs to enter Entrance Stairs-Rails: Right;Left;Can reach both Entrance Stairs-Number of Steps: 2   Home Layout: One level Home Equipment: Cane - single point      Prior Function Prior Level of Function : Independent/Modified Independent             Mobility Comments: Hydrographic surveyor, drives ADLs Comments: Independent     Hand Dominance   Dominant Hand: Right    Extremity/Trunk Assessment   Upper Extremity Assessment Upper Extremity Assessment: Overall WFL for tasks assessed    Lower Extremity Assessment Lower Extremity Assessment: Overall WFL for tasks assessed     Cervical / Trunk Assessment Cervical / Trunk Assessment: Normal  Communication   Communication: No difficulties  Cognition Arousal/Alertness: Awake/alert Behavior During Therapy: WFL for tasks assessed/performed Overall Cognitive Status: Within Functional Limits for tasks assessed                                          General Comments      Exercises     Assessment/Plan    PT Assessment Patient does not need any further PT services  PT Problem List         PT Treatment Interventions      PT Goals (Current goals can be found in the Care Plan section)  Acute Rehab PT Goals Patient Stated Goal: return home PT Goal Formulation: With patient/family Time For Goal Achievement: 03/15/21 Potential to Achieve Goals: Good    Frequency       Co-evaluation               AM-PAC PT "6 Clicks" Mobility  Outcome Measure Help needed turning from your back to your side while in a flat bed without using bedrails?: None Help needed moving from lying on your back to sitting on the side of a flat bed without using bedrails?: None Help needed moving to and from a bed to a chair (including a wheelchair)?: None Help needed standing up from a chair using your arms (e.g., wheelchair or bedside chair)?: None Help needed to walk in hospital room?: None Help needed climbing 3-5 steps with a railing? : None 6 Click Score: 24    End of Session Equipment Utilized During Treatment: Oxygen Activity Tolerance: Patient tolerated treatment well Patient left: in bed;with call bell/phone within reach;with family/visitor present Nurse Communication: Mobility status PT Visit Diagnosis: Unsteadiness on feet (R26.81);Other abnormalities of gait and mobility (R26.89);Muscle weakness (generalized) (M62.81)    Time: 4628-6381 PT Time Calculation (min) (ACUTE ONLY): 19 min   Charges:   PT Evaluation $PT Eval Low Complexity: 1 Low PT Treatments $Gait Training: 8-22 mins         2:44 PM, 03/15/21 Lonell Grandchild, MPT Physical Therapist with [facility] 336 [phone] office [phone] mobile phone

## 2021-03-15 NOTE — Progress Notes (Signed)
OT Cancellation Note  Patient Details Name: Marisa Bailey MRN: [id number] DOB: Aug 31, 1940   Cancelled Treatment:    Reason Eval/Treat Not Completed: OT screened, no needs identified, will sign off. Patient reports no issues. She states that she lives alone and is independent with all ADL and I/ADL tasks including driving. BUE strength is functional. No DME at home. Thank you for the referral.   Ailene Ravel, OTR/L,CBIS  [phone]  03/15/2021, 9:04 AM

## 2021-03-15 NOTE — TOC Progression Note (Signed)
Transition of Care [facility]) Screening Note   Patient Details  Name: Marisa Bailey Date of Birth: [birth year]   Transition of Care [facility]) CM/SW Contact:    Shade Flood, LCSW Phone Number: 03/15/2021, 9:37 AM    Transition of Care Department [facility]) has reviewed patient and no TOC needs have been identified at this time. We will continue to monitor patient advancement through interdisciplinary progression rounds. If new patient transition needs arise, please place a TOC consult.

## 2021-03-15 NOTE — Progress Notes (Signed)
Pt ambulated by mobility tech and PT. Without O2 pt was at 81% room air, with 3L of O2 pt came back up to 91%, and at rest with O2 at 3L pt came back up to 93%. Pt discharging at this time on 3 L of O2 and uses oxygen therapy at home chronic 3 L.

## 2021-03-16 LAB — CULTURE, RESPIRATORY W GRAM STAIN: Gram Stain: NONE SEEN

## 2021-03-30 ENCOUNTER — Other Ambulatory Visit (INDEPENDENT_AMBULATORY_CARE_PROVIDER_SITE_OTHER): Payer: Self-pay | Admitting: Nurse Practitioner

## 2021-04-04 ENCOUNTER — Other Ambulatory Visit: Payer: Self-pay | Admitting: *Deleted

## 2021-04-04 DIAGNOSIS — I10 Essential (primary) hypertension: Secondary | ICD-10-CM

## 2021-04-04 MED ORDER — OLMESARTAN MEDOXOMIL 40 MG PO TABS: 40.0000 mg | ORAL_TABLET | Freq: Every day | ORAL | 0 refills | Status: DC

## 2021-04-17 ENCOUNTER — Other Ambulatory Visit: Payer: Self-pay | Admitting: Internal Medicine

## 2021-04-17 DIAGNOSIS — E119 Type 2 diabetes mellitus without complications: Secondary | ICD-10-CM

## 2021-04-25 ENCOUNTER — Telehealth: Payer: Self-pay | Admitting: Internal Medicine

## 2021-04-25 NOTE — Telephone Encounter (Signed)
error ?

## 2021-05-03 ENCOUNTER — Other Ambulatory Visit: Payer: Self-pay | Admitting: *Deleted

## 2021-05-03 DIAGNOSIS — I10 Essential (primary) hypertension: Secondary | ICD-10-CM

## 2021-05-03 MED ORDER — AMLODIPINE BESYLATE 10 MG PO TABS: 10.0000 mg | ORAL_TABLET | Freq: Every day | ORAL | 0 refills | Status: DC

## 2021-05-30 ENCOUNTER — Other Ambulatory Visit: Payer: Self-pay | Admitting: Internal Medicine

## 2021-05-30 DIAGNOSIS — E785 Hyperlipidemia, unspecified: Secondary | ICD-10-CM

## 2021-06-06 ENCOUNTER — Encounter: Payer: Self-pay | Admitting: Pulmonary Disease

## 2021-06-06 ENCOUNTER — Ambulatory Visit (INDEPENDENT_AMBULATORY_CARE_PROVIDER_SITE_OTHER): Payer: Medicare Other | Admitting: Pulmonary Disease

## 2021-06-06 VITALS — BP 134/88 | HR 68 | Temp 98.6°F | Ht 64.0 in | Wt 156.0 lb

## 2021-06-06 DIAGNOSIS — J449 Chronic obstructive pulmonary disease, unspecified: Secondary | ICD-10-CM | POA: Diagnosis not present

## 2021-06-06 MED ORDER — NICOTINE 7 MG/24HR TD PT24: 7.0000 mg | MEDICATED_PATCH | Freq: Every day | TRANSDERMAL | 1 refills | Status: DC

## 2021-06-06 NOTE — Progress Notes (Signed)
? ?[facility] ? ?Chief Complaint  ?Patient presents with  ? Follow-up  ?  Breathing is about the same.  ?Patient wants to discuss coming off on oxygen   ? ? ?Past Surgical History:  ?She  has a past surgical history that includes Enucleation. ? 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 ?Constitutional:  ?BP 134/88 (BP Location: Left Arm, Patient Position: Sitting)   Pulse 68   Temp 98.6 ?F (37 ?C) (Temporal)   Ht _0  (1.626 m)   Wt 156 lb (70.8 kg)   SpO2 96% Comment: ra  BMI 26.78 kg/m?  ? ?Brief Summary:  ?Marisa Bailey is a 81 y.o. female smoker with COPD and bronchiectasis. ?  ? ? ? ?Subjective:  ? ?She is still smoking. ? ?She feels her breathing is better.  She isn't having as much cough and hasn't coughed up blood for a while.  She denies fever, sweats, or weight loss.  She maintained SpO2 > 91% on room air while walking 3 laps in office today. ? ?Physical Exam:  ? ?Appearance - well kempt ? ?ENMT - no sinus tenderness, no oral exudate, no LAN, Mallampati 4 airway, no stridor, wears dentures ? ?Respiratory - equal breath sounds bilaterally, no wheezing or rales ? ?CV - s1s2 regular rate and rhythm, no murmurs ? ?Ext - no clubbing, no edema ? ?Skin - no rashes ? ?Psych - normal mood and affect ?  ?Chest Imaging:  ?CT angio chest 01/10/19 >> b/l lower lobe BTX ?CT angio chest 11/02/20 >> atherosclerosis, patchy infiltrate and consolidation in bases ? ?Cardiac Tests:  ?Echo 05/26/19 >> EF 50%, grade 1 DD, mild LVH, mod RV dysfx, RVSP 36.1 mmHg ? ?Social History:  ?She  reports that she has been smoking cigarettes. She has a 18.00 pack-year smoking history. She has never used smokeless tobacco. She reports that she does not drink alcohol and does not use drugs. ? ?Family History:  ?Her family history includes Diabetes in her mother. ?  ? ? ? ?Assessment/Plan:  ? ?Tobacco abuse with reported history of COPD. ?- continue  anoro ?- prn albuterol ?- don't think there is any utility in having her do PFT at this time, since results wouldn't change current treatment options ?- will have her try using nicotine patch 7 mg/24 hr ?- will arrange for overnight oximetry on room air and then determine if she can have home oxygen set up removed ? ?Bronchiectasis. ?- prn mucinex ? ?Time Spent Involved in Patient Care on Day of Examination:  ?28 minutes ? ?Follow up:  ? ?Patient Instructions  ?Will arrange for oxygen test at night on room air ? ?Nicotine patch daily as needed to help with quitting cigarettes ? ?Follow up in 6 months ? ?Medication List:  ? ?Allergies as of 06/06/2021   ? ?   Reactions  ? Penicillins Rash  ? Has patient had a PCN reaction causing immediate rash, facial/tongue/throat swelling, SOB or lightheadedness with hypotension: {Yes/No:[id number] ?Has patient had a PCN reaction causing severe rash involving mucus membranes or skin necrosis: Yes ?Has patient had a PCN reaction that required hospitalization No ?Has patient had a PCN reaction occurring within the last 10 years: No ?If all of the above answers are "NO", then may proceed with Cephalosporin use. ?**Has tolerated keflex  ? ?  ? ?  ?Medication List  ?  ? ?  ? Accurate as of  June 06, 2021  2:40 PM. If you have any questions, ask your nurse or doctor.  ?  ?  ? ?  ? ?STOP taking these medications   ? ?Caltrate 600+D Plus Minerals 600-800 MG-UNIT Tabs ?Stopped by: Chesley Mires, MD ?  ?nicotine 21 mg/24hr patch ?Commonly known as: NICODERM CQ - dosed in mg/24 hours ?Replaced by: nicotine 7 mg/24hr patch ?Stopped by: Chesley Mires, MD ?  ? ?  ? ?TAKE these medications   ? ?albuterol (2.5 MG/3ML) 0.083% nebulizer solution ?Commonly known as: PROVENTIL ?Take 3 mLs (2.5 mg total) by nebulization every 6 (six) hours as needed for wheezing or shortness of breath. ?  ?albuterol 108 (90 Base) MCG/ACT inhaler ?Commonly known as: VENTOLIN HFA ?Inhale 2 puffs into the lungs every 6 (six)  hours as needed for wheezing or shortness of breath. ?  ?amLODipine 10 MG tablet ?Commonly known as: NORVASC ?Take 1 tablet (10 mg total) by mouth daily. ?  ?aspirin EC 81 MG tablet ?Take 1 tablet (81 mg total) by mouth daily with breakfast. ?  ?atorvastatin 20 MG tablet ?Commonly known as: LIPITOR ?TAKE 1 TABLET BY MOUTH DAILY ?  ?EPINEPHrine 0.3 mg/0.3 mL Soaj injection ?Commonly known as: EPI-PEN ?Inject 0.3 mg into the muscle as needed for anaphylaxis. ?  ?folic acid 1 MG tablet ?Commonly known as: FOLVITE ?Take 1 tablet (1 mg total) by mouth daily. ?  ?meloxicam 7.5 MG tablet ?Commonly known as: MOBIC ?Take 7.5 mg by mouth daily. ?  ?metFORMIN 500 MG tablet ?Commonly known as: GLUCOPHAGE ?Take 1 tablet by mouth twice daily ?  ?nicotine 7 mg/24hr patch ?Commonly known as: NICODERM CQ - dosed in mg/24 hr ?Place 1 patch (7 mg total) onto the skin daily. ?Replaces: nicotine 21 mg/24hr patch ?Started by: Chesley Mires, MD ?  ?olmesartan 40 MG tablet ?Commonly known as: BENICAR ?Take 1 tablet (40 mg total) by mouth daily. ?  ?umeclidinium-vilanterol 62.5-25 MCG/ACT Aepb ?Commonly known as: ANORO ELLIPTA ?Inhale 1 puff into the lungs daily. ?  ?Vitamin D3 25 MCG (1000 UT) Caps ?Take 2 capsules by mouth daily. ?  ?Zinc 50 MG Caps ?Take 1 capsule by mouth daily. ?  ? ?  ? ? ?Signature:  ?Chesley Mires, MD ?[place] ?Pager - [phone] ?06/06/2021, 2:40 PM ?  ? ? ? ? ? ? ? ? ?

## 2021-06-06 NOTE — Patient Instructions (Signed)
Will arrange for oxygen test at night on room air ? ?Nicotine patch daily as needed to help with quitting cigarettes ? ?Follow up in 6 months ?

## 2021-06-11 ENCOUNTER — Other Ambulatory Visit: Payer: Self-pay | Admitting: *Deleted

## 2021-06-11 MED ORDER — MELOXICAM 7.5 MG PO TABS: 7.5000 mg | ORAL_TABLET | Freq: Every day | ORAL | 0 refills | Status: DC

## 2021-06-12 ENCOUNTER — Telehealth: Payer: Self-pay | Admitting: Pulmonary Disease

## 2021-06-12 NOTE — Telephone Encounter (Signed)
ATC wanda from [place]. LMTCB  ?

## 2021-06-12 NOTE — Telephone Encounter (Signed)
Called and spoke to [place] from [place]. She states since patient does not have O2 or supplies with them that there would be a charge for the ONO. She states patient told her she has oxygen at home and is unsure where she got it from but thinks it was a DME company in [place](?) Mariann Laster suggests patient getting ONO done from there since patient doesn't want to pay $35.  ? ?PCCs can we try to send this to [facility]? Not sure if this is patients DME or not but speculating she may be an [facility] customer since she said DME is in [place].  ?ATC patient to verify. LMTCB  ? ?

## 2021-06-13 NOTE — Telephone Encounter (Signed)
I sent it to [facility]. nothing further needed at this time ?

## 2021-06-14 ENCOUNTER — Other Ambulatory Visit: Payer: Self-pay | Admitting: *Deleted

## 2021-06-14 DIAGNOSIS — E119 Type 2 diabetes mellitus without complications: Secondary | ICD-10-CM

## 2021-06-14 MED ORDER — METFORMIN HCL 500 MG PO TABS: 500.0000 mg | ORAL_TABLET | Freq: Two times a day (BID) | ORAL | 0 refills | Status: DC

## 2021-06-14 MED ORDER — FOLIC ACID 1 MG PO TABS: 1.0000 mg | ORAL_TABLET | Freq: Every day | ORAL | 0 refills | Status: DC

## 2021-06-15 ENCOUNTER — Other Ambulatory Visit: Payer: Self-pay | Admitting: Internal Medicine

## 2021-06-15 DIAGNOSIS — I10 Essential (primary) hypertension: Secondary | ICD-10-CM

## 2021-06-18 NOTE — Telephone Encounter (Signed)
Let pt know that order was sent to [facility] for ONO.  Let her know if they haven't contacted her, to call us back.   ?

## 2021-06-18 NOTE — Telephone Encounter (Signed)
Noted. Thanks.

## 2021-07-03 DIAGNOSIS — R0683 Snoring: Secondary | ICD-10-CM | POA: Diagnosis not present

## 2021-07-03 DIAGNOSIS — G4730 Sleep apnea, unspecified: Secondary | ICD-10-CM | POA: Diagnosis not present

## 2021-07-12 DIAGNOSIS — J449 Chronic obstructive pulmonary disease, unspecified: Secondary | ICD-10-CM | POA: Diagnosis not present

## 2021-07-30 ENCOUNTER — Other Ambulatory Visit: Payer: Self-pay | Admitting: Internal Medicine

## 2021-07-30 NOTE — Progress Notes (Signed)
Cardiology Office Note:    Date:  08/02/2021   ID:  Marisa Bailey, DOB 02/03/1941, MRN [id number]  PCP:  [name], MD  Cardiologist:  Donato Heinz, MD  Electrophysiologist:  None   Referring MD: [name], MD   Chief Complaint  Patient presents with   Congestive Heart Failure    History of Present Illness:    Marisa Bailey is a 81 y.o. female with a hx of chronic diastolic heart failure, COPD, hypertension, tobacco use who presents for follow-up.  She was referred by Dr. Posey Pronto for evaluation of heart failure, initially seen on 01/18/2021.  Echocardiogram 05/26/2019 showed EF 50%, global hypokinesis, mild LVH, grade 1 diastolic dysfunction, interventricular septum flattening in systole/diastole consistent with RV pressure and volume overload, moderate RV systolic dysfunction, moderate RV enlargement, RVSP 36 mmHg, no significant valvular disease.  Echocardiogram 02/28/2021 shows normal biventricular function, normal RVSP, no significant valvular disease.  Since last clinic visit, she reports that she is doing well.  Denies any chest pain, dyspnea, lightheadedness, syncope, lower extremity edema, or palpitations.  Reports she has not been exercising.  Continues to smoke 1 pack/day.  Wt Readings from Last 3 Encounters:  08/02/21 158 lb 6.4 oz (71.8 kg)  06/06/21 156 lb (70.8 kg)  03/13/21 156 lb 1.4 oz (70.8 kg)      Past Medical History:  Diagnosis Date   Aspiration pneumonia ([place]) 11/03/2020   Blind right eye    CHF (congestive heart failure) (HCC)    COPD (chronic obstructive pulmonary disease) ([place])    Fx upper humerus-closed 08/17/2012   Hypertension     Past Surgical History:  Procedure Laterality Date   ENUCLEATION      Current Medications: Current Meds  Medication Sig   albuterol (VENTOLIN HFA) 108 (90 Base) MCG/ACT inhaler Inhale 2 puffs into the lungs every 6 (six) hours as needed for wheezing or shortness of breath.   amLODipine  (NORVASC) 10 MG tablet Take 1 tablet (10 mg total) by mouth daily.   aspirin EC 81 MG tablet Take 1 tablet (81 mg total) by mouth daily with breakfast.   atorvastatin (LIPITOR) 20 MG tablet TAKE 1 TABLET BY MOUTH DAILY   Cholecalciferol (VITAMIN D3) 25 MCG (1000 UT) CAPS Take 2 capsules by mouth daily.   EPINEPHrine 0.3 mg/0.3 mL IJ SOAJ injection Inject 0.3 mg into the muscle as needed for anaphylaxis.   folic acid (FOLVITE) 1 MG tablet Take 1 tablet (1 mg total) by mouth daily.   meloxicam (MOBIC) 7.5 MG tablet Take 1 tablet by mouth once daily   metFORMIN (GLUCOPHAGE) 500 MG tablet Take 1 tablet (500 mg total) by mouth 2 (two) times daily.   nicotine (NICODERM CQ - DOSED IN MG/24 HR) 7 mg/24hr patch Place 1 patch (7 mg total) onto the skin daily.   olmesartan (BENICAR) 40 MG tablet TAKE 1 TABLET BY MOUTH DAILY   umeclidinium-vilanterol (ANORO ELLIPTA) 62.5-25 MCG/ACT AEPB Inhale 1 puff into the lungs daily.   Zinc 50 MG CAPS Take 1 capsule by mouth daily.     Allergies:   Penicillins   Social History   Socioeconomic History   Marital status: Single    Spouse name: Not on file   Number of children: Not on file   Years of education: Not on file   Highest education level: Not on file  Occupational History   Not on file  Tobacco Use   Smoking status: Every Day  Packs/day: 1.00    Years: 18.00    Total pack years: 18.00    Types: Cigarettes   Smokeless tobacco: Never  Vaping Use   Vaping Use: Never used  Substance and Sexual Activity   Alcohol use: No    Comment: rarely   Drug use: No   Sexual activity: Not on file  Other Topics Concern   Not on file  Social History Narrative   Retired (from [employer]). No children, never been married. Has 8 siblings (5 are deceased).   Social Determinants of Health   Financial Resource Strain: Low Risk  (11/30/2020)   Overall Financial Resource Strain (CARDIA)    Difficulty of Paying Living Expenses: Not hard at all  Food Insecurity: No  Food Insecurity (11/30/2020)   Hunger Vital Sign    Worried About Running Out of Food in the Last Year: Never true    Ran Out of Food in the Last Year: Never true  Transportation Needs: No Transportation Needs (11/30/2020)   PRAPARE - Hydrologist (Medical): No    Lack of Transportation (Non-Medical): No  Physical Activity: Sufficiently Active (11/30/2020)   Exercise Vital Sign    Days of Exercise per Week: 7 days    Minutes of Exercise per Session: 30 min  Stress: No Stress Concern Present (11/30/2020)   [place]    Feeling of Stress : Not at all  Social Connections: Moderately Isolated (11/30/2020)   Social Connection and Isolation Panel [NHANES]    Frequency of Communication with Friends and Family: More than three times a week    Frequency of Social Gatherings with Friends and Family: More than three times a week    Attends Religious Services: 1 to 4 times per year    Active Member of [employer] or Organizations: No    Attends Archivist Meetings: Never    Marital Status: Never married     Family History: The patient's family history includes Diabetes in her mother.  ROS:   Please see the history of present illness.     All other systems reviewed and are negative.  EKGs/Labs/Other Studies Reviewed:    The following studies were reviewed today:   EKG:  EKG is not ordered today.  The ekg ordered 11/02/2020 demonstrates sinus rhythm, rate 80, poor R wave progression  Recent Labs: 03/13/2021: ALT 12; B Natriuretic Peptide 287.0 03/14/2021: BUN 21; Creatinine, Ser 0.81; Hemoglobin 11.1; Magnesium 2.1; Platelets 241; Potassium 4.7; Sodium 137; TSH 0.344  Recent Lipid Panel    Component Value Date/Time   CHOL 126 09/07/2020 1106   TRIG 138 09/07/2020 1106   HDL 46 (L) 09/07/2020 1106   CHOLHDL 2.7 09/07/2020 1106   VLDL 18 05/28/2019 0614   LDLCALC 58 09/07/2020 1106     Physical Exam:    VS:  BP 130/62   Pulse 66   Ht _0  (1.626 m)   Wt 158 lb 6.4 oz (71.8 kg)   BMI 27.19 kg/m     Wt Readings from Last 3 Encounters:  08/02/21 158 lb 6.4 oz (71.8 kg)  06/06/21 156 lb (70.8 kg)  03/13/21 156 lb 1.4 oz (70.8 kg)     GEN:  Well nourished, well developed in no acute distress HEENT: Normal NECK: No JVD; No carotid bruits CARDIAC: RRR, no murmurs, rubs, gallops RESPIRATORY:  Clear to auscultation without rales, wheezing or rhonchi  ABDOMEN: Soft, non-tender, non-distended MUSCULOSKELETAL:  Trace  edema; No deformity  SKIN: Warm and dry NEUROLOGIC:  Alert and oriented x 3 PSYCHIATRIC:  Normal affect   ASSESSMENT:    1. Chronic diastolic heart failure (HCC)   2. Pulmonary HTN ([place])   3. Essential hypertension   4. Hyperlipidemia, unspecified hyperlipidemia type   5. Tobacco use     PLAN:    Chronic diastolic heart failure: Echocardiogram 05/26/2019 showed EF 50%, global hypokinesis, mild LVH, grade 1 diastolic dysfunction, interventricular septum flattening in systole/diastole consistent with RV pressure and volume overload, moderate RV systolic dysfunction, moderate RV enlargement, RVSP 36 mmHg, no significant valvular disease.  Echocardiogram 02/28/2021 shows normal biventricular function, normal RVSP, no significant valvular disease. -Appears euvolemic, not on diuretic  Pulmonary hypertension: Mildly elevated RVSP (36 mmHg) on echo 05/2019.  Likely class III due to lung disease.  Improved on repeat echocardiogram 02/2021  COPD: Follows with pulmonology  Hypertension: On amlodipine 10 mg daily, olmesartan 40 mg daily.  Appears controlled  Hyperlipidemia: On atorvastatin 20 mg daily. LDL 58 on 09/07/20.   Tobacco use: Counseled on the risk of tobacco use and cessation strongly encouraged  RTC in 1 year  Medication Adjustments/Labs and Tests Ordered: Current medicines are reviewed at length with the patient today.  Concerns regarding  medicines are outlined above.  No orders of the defined types were placed in this encounter.  No orders of the defined types were placed in this encounter.    Patient Instructions  Medication Instructions:  Your physician recommends that you continue on your current medications as directed. Please refer to the Current Medication list given to you today.  *If you need a refill on your cardiac medications before your next appointment, please call your pharmacy*   Lab Work: NONE   If you have labs (blood work) drawn today and your tests are completely normal, you will receive your results only by: [place] (if you have MyChart) OR A paper copy in the mail If you have any lab test that is abnormal or we need to change your treatment, we will call you to review the results.   Testing/Procedures: NONE    Follow-Up: At [facility], you and your health needs are our priority.  As part of our continuing mission to provide you with exceptional heart care, we have created designated Provider Care Teams.  These Care Teams include your primary Cardiologist (physician) and Advanced Practice Providers (APPs -  Physician Assistants and Nurse Practitioners) who all work together to provide you with the care you need, when you need it.  We recommend signing up for the patient portal called "MyChart".  Sign up information is provided on this After Visit Summary.  MyChart is used to connect with patients for Virtual Visits (Telemedicine).  Patients are able to view lab/test results, encounter notes, upcoming appointments, etc.  Non-urgent messages can be sent to your provider as well.   To learn more about what you can do with MyChart, go to [email or web address].    Your next appointment:   1 year(s)  The format for your next appointment:   In Person  Provider:   Dr. Gardiner Rhyme      Other Instructions Thank you for choosing [facility]!    Important Information About  [place]         Signed, Donato Heinz, MD  08/02/2021 11:08 PM    [place]

## 2021-08-02 ENCOUNTER — Ambulatory Visit (INDEPENDENT_AMBULATORY_CARE_PROVIDER_SITE_OTHER): Payer: Medicare Other | Admitting: Cardiology

## 2021-08-02 ENCOUNTER — Encounter: Payer: Self-pay | Admitting: Cardiology

## 2021-08-02 VITALS — BP 130/62 | HR 66 | Ht 64.0 in | Wt 158.4 lb

## 2021-08-02 DIAGNOSIS — Z720 Tobacco use: Secondary | ICD-10-CM

## 2021-08-02 DIAGNOSIS — H01002 Unspecified blepharitis right lower eyelid: Secondary | ICD-10-CM | POA: Diagnosis not present

## 2021-08-02 DIAGNOSIS — I5032 Chronic diastolic (congestive) heart failure: Secondary | ICD-10-CM

## 2021-08-02 DIAGNOSIS — E785 Hyperlipidemia, unspecified: Secondary | ICD-10-CM

## 2021-08-02 DIAGNOSIS — I272 Pulmonary hypertension, unspecified: Secondary | ICD-10-CM | POA: Diagnosis not present

## 2021-08-02 DIAGNOSIS — I10 Essential (primary) hypertension: Secondary | ICD-10-CM | POA: Diagnosis not present

## 2021-08-02 DIAGNOSIS — H25812 Combined forms of age-related cataract, left eye: Secondary | ICD-10-CM | POA: Diagnosis not present

## 2021-08-02 DIAGNOSIS — H01001 Unspecified blepharitis right upper eyelid: Secondary | ICD-10-CM | POA: Diagnosis not present

## 2021-08-02 DIAGNOSIS — H01004 Unspecified blepharitis left upper eyelid: Secondary | ICD-10-CM | POA: Diagnosis not present

## 2021-08-02 NOTE — Patient Instructions (Signed)
Medication Instructions:  Your physician recommends that you continue on your current medications as directed. Please refer to the Current Medication list given to you today.  *If you need a refill on your cardiac medications before your next appointment, please call your pharmacy*   Lab Work: NONE   If you have labs (blood work) drawn today and your tests are completely normal, you will receive your results only by: [place] (if you have MyChart) OR A paper copy in the mail If you have any lab test that is abnormal or we need to change your treatment, we will call you to review the results.   Testing/Procedures: NONE    Follow-Up: At [facility], you and your health needs are our priority.  As part of our continuing mission to provide you with exceptional heart care, we have created designated Provider Care Teams.  These Care Teams include your primary Cardiologist (physician) and Advanced Practice Providers (APPs -  Physician Assistants and Nurse Practitioners) who all work together to provide you with the care you need, when you need it.  We recommend signing up for the patient portal called "MyChart".  Sign up information is provided on this After Visit Summary.  MyChart is used to connect with patients for Virtual Visits (Telemedicine).  Patients are able to view lab/test results, encounter notes, upcoming appointments, etc.  Non-urgent messages can be sent to your provider as well.   To learn more about what you can do with MyChart, go to [email or web address].    Your next appointment:   1 year(s)  The format for your next appointment:   In Person  Provider:   Dr. Gardiner Rhyme      Other Instructions Thank you for choosing [facility]!    Important Information About [facility]

## 2021-08-15 ENCOUNTER — Observation Stay (HOSPITAL_COMMUNITY)
Admission: EM | Admit: 2021-08-15 | Discharge: 2021-08-16 | Disposition: A | Discharge status: Home / Self Care | Payer: Medicare Other | Attending: Internal Medicine | Admitting: Internal Medicine

## 2021-08-15 ENCOUNTER — Emergency Department (HOSPITAL_COMMUNITY): Payer: Medicare Other

## 2021-08-15 ENCOUNTER — Encounter (HOSPITAL_COMMUNITY): Payer: Self-pay

## 2021-08-15 ENCOUNTER — Other Ambulatory Visit: Payer: Self-pay

## 2021-08-15 DIAGNOSIS — F17210 Nicotine dependence, cigarettes, uncomplicated: Secondary | ICD-10-CM | POA: Insufficient documentation

## 2021-08-15 DIAGNOSIS — J449 Chronic obstructive pulmonary disease, unspecified: Secondary | ICD-10-CM | POA: Diagnosis not present

## 2021-08-15 DIAGNOSIS — D509 Iron deficiency anemia, unspecified: Secondary | ICD-10-CM | POA: Diagnosis not present

## 2021-08-15 DIAGNOSIS — J9611 Chronic respiratory failure with hypoxia: Secondary | ICD-10-CM | POA: Diagnosis present

## 2021-08-15 DIAGNOSIS — Z79899 Other long term (current) drug therapy: Secondary | ICD-10-CM | POA: Insufficient documentation

## 2021-08-15 DIAGNOSIS — I10 Essential (primary) hypertension: Secondary | ICD-10-CM | POA: Diagnosis not present

## 2021-08-15 DIAGNOSIS — J441 Chronic obstructive pulmonary disease with (acute) exacerbation: Secondary | ICD-10-CM | POA: Diagnosis not present

## 2021-08-15 DIAGNOSIS — Z9981 Dependence on supplemental oxygen: Secondary | ICD-10-CM | POA: Insufficient documentation

## 2021-08-15 DIAGNOSIS — H5440 Blindness, one eye, unspecified eye: Secondary | ICD-10-CM | POA: Diagnosis present

## 2021-08-15 DIAGNOSIS — I2609 Other pulmonary embolism with acute cor pulmonale: Principal | ICD-10-CM | POA: Insufficient documentation

## 2021-08-15 DIAGNOSIS — J439 Emphysema, unspecified: Secondary | ICD-10-CM | POA: Diagnosis not present

## 2021-08-15 DIAGNOSIS — R079 Chest pain, unspecified: Secondary | ICD-10-CM | POA: Diagnosis not present

## 2021-08-15 DIAGNOSIS — I509 Heart failure, unspecified: Secondary | ICD-10-CM | POA: Diagnosis not present

## 2021-08-15 DIAGNOSIS — Z20822 Contact with and (suspected) exposure to covid-19: Secondary | ICD-10-CM | POA: Diagnosis not present

## 2021-08-15 DIAGNOSIS — I2723 Pulmonary hypertension due to lung diseases and hypoxia: Secondary | ICD-10-CM | POA: Diagnosis present

## 2021-08-15 DIAGNOSIS — I2699 Other pulmonary embolism without acute cor pulmonale: Secondary | ICD-10-CM | POA: Diagnosis present

## 2021-08-15 DIAGNOSIS — I110 Hypertensive heart disease with heart failure: Secondary | ICD-10-CM | POA: Insufficient documentation

## 2021-08-15 DIAGNOSIS — Z7982 Long term (current) use of aspirin: Secondary | ICD-10-CM | POA: Insufficient documentation

## 2021-08-15 DIAGNOSIS — J690 Pneumonitis due to inhalation of food and vomit: Secondary | ICD-10-CM | POA: Insufficient documentation

## 2021-08-15 DIAGNOSIS — D649 Anemia, unspecified: Secondary | ICD-10-CM | POA: Diagnosis present

## 2021-08-15 DIAGNOSIS — Z7984 Long term (current) use of oral hypoglycemic drugs: Secondary | ICD-10-CM | POA: Insufficient documentation

## 2021-08-15 DIAGNOSIS — M549 Dorsalgia, unspecified: Secondary | ICD-10-CM | POA: Insufficient documentation

## 2021-08-15 DIAGNOSIS — R7303 Prediabetes: Secondary | ICD-10-CM | POA: Diagnosis not present

## 2021-08-15 DIAGNOSIS — Z720 Tobacco use: Secondary | ICD-10-CM | POA: Diagnosis present

## 2021-08-15 DIAGNOSIS — I2694 Multiple subsegmental pulmonary emboli without acute cor pulmonale: Secondary | ICD-10-CM | POA: Diagnosis not present

## 2021-08-15 DIAGNOSIS — R0602 Shortness of breath: Secondary | ICD-10-CM | POA: Diagnosis not present

## 2021-08-15 HISTORY — DX: Other pulmonary embolism without acute cor pulmonale: I26.99

## 2021-08-15 LAB — BLOOD GAS, VENOUS
Acid-Base Excess: 4.7 mmol/L — ABNORMAL HIGH (ref 0.0–2.0)
Bicarbonate: 32.2 mmol/L — ABNORMAL HIGH (ref 20.0–28.0)
Drawn by: 62097
O2 Saturation: 54.4 %
Patient temperature: 36.8
pCO2, Ven: 60 mmHg (ref 44–60)
pH, Ven: 7.33 (ref 7.25–7.43)
pO2, Ven: 31 mmHg — CL (ref 32–45)

## 2021-08-15 LAB — COMPREHENSIVE METABOLIC PANEL
ALT: 10 U/L (ref 0–44)
AST: 13 U/L — ABNORMAL LOW (ref 15–41)
Albumin: 3.7 g/dL (ref 3.5–5.0)
Alkaline Phosphatase: 73 U/L (ref 38–126)
Anion gap: 7 (ref 5–15)
BUN: 19 mg/dL (ref 8–23)
CO2: 26 mmol/L (ref 22–32)
Calcium: 8.5 mg/dL — ABNORMAL LOW (ref 8.9–10.3)
Chloride: 109 mmol/L (ref 98–111)
Creatinine, Ser: 0.94 mg/dL (ref 0.44–1.00)
GFR, Estimated: >60 mL/min (ref >60)
Glucose, Bld: 92 mg/dL (ref 70–99)
Potassium: 4.2 mmol/L (ref 3.5–5.1)
Sodium: 142 mmol/L (ref 135–145)
Total Bilirubin: 0.6 mg/dL (ref 0.3–1.2)
Total Protein: 7.3 g/dL (ref 6.5–8.1)

## 2021-08-15 LAB — CBC
HCT: 39.6 % (ref 36.0–46.0)
Hemoglobin: 11.4 g/dL — ABNORMAL LOW (ref 12.0–15.0)
MCH: 24.3 pg — ABNORMAL LOW (ref 26.0–34.0)
MCHC: 28.8 g/dL — ABNORMAL LOW (ref 30.0–36.0)
MCV: 84.3 fL (ref 80.0–100.0)
Platelets: 224 10*3/uL (ref 150–400)
RBC: 4.7 MIL/uL (ref 3.87–5.11)
RDW: 17.2 % — ABNORMAL HIGH (ref 11.5–15.5)
WBC: 5.5 10*3/uL (ref 4.0–10.5)
nRBC: 0 % (ref 0.0–0.2)

## 2021-08-15 LAB — URINALYSIS, ROUTINE W REFLEX MICROSCOPIC
Bilirubin Urine: NEGATIVE
Glucose, UA: NEGATIVE mg/dL
Hgb urine dipstick: NEGATIVE
Ketones, ur: NEGATIVE mg/dL
Leukocytes,Ua: NEGATIVE
Nitrite: NEGATIVE
Protein, ur: 30 mg/dL — AB
Specific Gravity, Urine: 1.045 — ABNORMAL HIGH (ref 1.005–1.030)
pH: 5 (ref 5.0–8.0)

## 2021-08-15 LAB — RESP PANEL BY RT-PCR (FLU A&B, COVID) ARPGX2
Influenza A by PCR: NEGATIVE
Influenza B by PCR: NEGATIVE
SARS Coronavirus 2 by RT PCR: NEGATIVE

## 2021-08-15 LAB — PROCALCITONIN: Procalcitonin: <0.1 ng/mL

## 2021-08-15 LAB — D-DIMER, QUANTITATIVE: D-Dimer, Quant: 0.86 ug/mL-FEU — ABNORMAL HIGH (ref 0.00–0.50)

## 2021-08-15 MED ORDER — ACETAMINOPHEN 325 MG PO TABS: 650.0000 mg | ORAL_TABLET | Freq: Four times a day (QID) | ORAL | Status: DC | PRN

## 2021-08-15 MED ORDER — ATORVASTATIN CALCIUM 10 MG PO TABS
20.0000 mg | ORAL_TABLET | Freq: Every day | ORAL | Status: DC
  Administered 2021-08-16: 20 mg via ORAL
  Filled 2021-08-15: qty 2

## 2021-08-15 MED ORDER — HYDROCODONE-ACETAMINOPHEN 5-325 MG PO TABS: 1.0000 | ORAL_TABLET | ORAL | Status: DC | PRN

## 2021-08-15 MED ORDER — IOHEXOL 350 MG/ML SOLN
100.0000 mL | Freq: Once | INTRAVENOUS | Status: AC | PRN
  Administered 2021-08-15: 75 mL via INTRAVENOUS

## 2021-08-15 MED ORDER — HEPARIN BOLUS VIA INFUSION
4200.0000 [IU] | Freq: Once | INTRAVENOUS | Status: AC
  Administered 2021-08-15: 4200 [IU] via INTRAVENOUS

## 2021-08-15 MED ORDER — NICOTINE 14 MG/24HR TD PT24
14.0000 mg | MEDICATED_PATCH | Freq: Every day | TRANSDERMAL | Status: DC
  Administered 2021-08-16: 14 mg via TRANSDERMAL
  Filled 2021-08-15: qty 1

## 2021-08-15 MED ORDER — ORAL CARE MOUTH RINSE: 15.0000 mL | OROMUCOSAL | Status: DC | PRN

## 2021-08-15 MED ORDER — AMLODIPINE BESYLATE 5 MG PO TABS
10.0000 mg | ORAL_TABLET | Freq: Every day | ORAL | Status: DC
  Administered 2021-08-16: 10 mg via ORAL
  Filled 2021-08-15: qty 2

## 2021-08-15 MED ORDER — IOHEXOL 350 MG/ML SOLN: 100.0000 mL | Freq: Once | INTRAVENOUS | Status: DC | PRN

## 2021-08-15 MED ORDER — ACETAMINOPHEN 650 MG RE SUPP: 650.0000 mg | Freq: Four times a day (QID) | RECTAL | Status: DC | PRN

## 2021-08-15 MED ORDER — ALBUTEROL SULFATE (2.5 MG/3ML) 0.083% IN NEBU: 2.5000 mg | INHALATION_SOLUTION | Freq: Four times a day (QID) | RESPIRATORY_TRACT | Status: DC | PRN

## 2021-08-15 MED ORDER — SODIUM CHLORIDE 0.9% FLUSH
3.0000 mL | Freq: Two times a day (BID) | INTRAVENOUS | Status: DC
  Administered 2021-08-15 – 2021-08-16 (×2): 3 mL via INTRAVENOUS

## 2021-08-15 MED ORDER — HEPARIN (PORCINE) 25000 UT/250ML-% IV SOLN
1200.0000 [IU]/h | INTRAVENOUS | Status: AC
  Administered 2021-08-15: 1150 [IU]/h via INTRAVENOUS
  Filled 2021-08-15 (×2): qty 250

## 2021-08-15 MED ORDER — IRBESARTAN 150 MG PO TABS
300.0000 mg | ORAL_TABLET | Freq: Every day | ORAL | Status: DC
  Administered 2021-08-16: 300 mg via ORAL
  Filled 2021-08-15: qty 2

## 2021-08-15 MED ORDER — CALCIUM GLUCONATE-NACL 1-0.675 GM/50ML-% IV SOLN
1.0000 g | Freq: Once | INTRAVENOUS | Status: AC
  Administered 2021-08-15: 1000 mg via INTRAVENOUS
  Filled 2021-08-15: qty 50

## 2021-08-15 MED ORDER — FOLIC ACID 1 MG PO TABS
1.0000 mg | ORAL_TABLET | Freq: Every day | ORAL | Status: DC
  Administered 2021-08-16: 1 mg via ORAL
  Filled 2021-08-15: qty 1

## 2021-08-15 NOTE — ED Triage Notes (Signed)
Pt c/o back pain x 2 days. Pt states no injury noted.

## 2021-08-15 NOTE — Progress Notes (Signed)
ANTICOAGULATION CONSULT NOTE - Initial Consult  Pharmacy Consult for heparin Indication: pulmonary embolus  Allergies  Allergen Reactions   Penicillins Rash    Has patient had a PCN reaction causing immediate rash, facial/tongue/throat swelling, SOB or lightheadedness with hypotension: {Yes/No:[id number] Has patient had a PCN reaction causing severe rash involving mucus membranes or skin necrosis: Yes Has patient had a PCN reaction that required hospitalization No Has patient had a PCN reaction occurring within the last 10 years: No If all of the above answers are "NO", then may proceed with Cephalosporin use.  **Has tolerated keflex     Patient Measurements: Height: _0  (162.6 cm) Weight: 70.5 kg (155 lb 8 oz) IBW/kg (Calculated) : 54.7 Heparin Dosing Weight: 70kg  Vital Signs: Temp: 98.2 F (36.8 C) (07/05 1436) Temp Source: Oral (07/05 1436) BP: 129/76 (07/05 1700) Pulse Rate: 65 (07/05 1700)  Labs: Recent Labs    08/15/21 1438  HGB 11.4*  HCT 39.6  PLT 224  CREATININE 0.94    Estimated Creatinine Clearance: 46 mL/min (by C-G formula based on SCr of 0.94 mg/dL).   Medical History: Past Medical History:  Diagnosis Date   Aspiration pneumonia ([place]) 11/03/2020   Blind right eye    CHF (congestive heart failure) (H[facility])    COPD (chronic obstructive pulmonary disease) ([place])    Fx upper humerus-closed 08/17/2012   Hypertension     Medications:  (Not in a hospital admission)   Assessment: Pharmacy consulted to dose heparin in patient with pulmonary embolism confirmed by CT angio.  Patient is not on anticoagulation prior to admission.  CBC WNL D-Dimer 0.86  Goal of Therapy:  Heparin level 0.3-0.7 units/ml Monitor platelets by anticoagulation protocol: Yes   Plan:  Give 4200 units bolus x 1 Start heparin infusion at 1150 units/hr Check anti-Xa level in 8 hours and daily while on heparin Continue to monitor H&H and platelets  Ramond Craver 08/15/2021,5:43 PM

## 2021-08-15 NOTE — ED Provider Notes (Signed)
[facility] EMERGENCY DEPARTMENT Provider Note   CSN: [id number] Arrival date & time: 08/15/21  1423     History  Chief Complaint  Patient presents with   Back Pain   Shortness of Breath    Marisa Bailey is a 81 y.o. female.   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  Patient states she does not want to be on oxygen so she has not been using it.  Patient presents to the ED with complaints of pain in her left flank back region.  She denies any recent falls or injuries.  She denies feeling short of breath.  She does cough continuously but is not sure if that is just related to her smoking.  She also has noticed leg swelling.  Home Medications Prior to Admission medications   Medication Sig Start Date End Date Taking? Authorizing Provider  albuterol (VENTOLIN HFA) 108 (90 Base) MCG/ACT inhaler Inhale 2 puffs into the lungs every 6 (six) hours as needed for wheezing or shortness of breath. 03/15/21  Yes Mercy Riding, MD  amLODipine (NORVASC) 10 MG tablet Take 1 tablet (10 mg total) by mouth daily. 05/03/21  Yes [name], MD  aspirin EC 81 MG tablet Take 1 tablet (81 mg total) by mouth daily with breakfast. 05/28/19  Yes Emokpae, Courage, MD  atorvastatin (LIPITOR) 20 MG tablet TAKE 1 TABLET BY MOUTH DAILY 05/30/21  Yes [name], MD  Cholecalciferol (VITAMIN D3) 25 MCG (1000 UT) CAPS Take 2 capsules by mouth daily.   Yes [provider]  folic acid (FOLVITE) 1 MG tablet Take 1 tablet (1 mg total) by mouth daily. 06/14/21  Yes [name], MD  meloxicam (MOBIC) 7.5 MG tablet Take 1 tablet by mouth once daily 07/30/21  Yes [name], MD  metFORMIN (GLUCOPHAGE) 500 MG tablet Take 1 tablet (500 mg total) by mouth 2 (two) times daily. 06/14/21  Yes [name], MD  olmesartan (BENICAR)  40 MG tablet TAKE 1 TABLET BY MOUTH DAILY 06/18/21  Yes [name], MD  albuterol (PROVENTIL) (2.5 MG/3ML) 0.083% nebulizer solution Take 3 mLs (2.5 mg total) by nebulization every 6 (six) hours as needed for wheezing or shortness of breath. Patient not taking: Reported on 08/15/2021 03/15/21   Mercy Riding, MD  ILEVRO 0.3 % ophthalmic suspension 1 drop daily. 08/06/21   [provider]  moxifloxacin (VIGAMOX) 0.5 % ophthalmic solution Apply 1 drop to eye 3 (three) times daily. 08/06/21   [provider]  nicotine (NICODERM CQ - DOSED IN MG/24 HR) 7 mg/24hr patch Place 1 patch (7 mg total) onto the skin daily. Patient not taking: Reported on 08/15/2021 06/06/21   Chesley Mires, MD  prednisoLONE acetate (PRED FORTE) 1 % ophthalmic suspension 1 drop 3 (three) times daily. 08/06/21   [provider]      Allergies    Penicillins    Review of Systems   Review of Systems  Respiratory:  Positive for shortness of breath.   Musculoskeletal:  Positive for back pain.    Physical Exam Updated Vital Signs BP 129/76   Pulse 65   Temp 98.2 F (36.8 C) (Oral)   Resp 18   Ht 1.626 m (_0 )   Wt 70.5 kg   SpO2 96%  BMI 26.69 kg/m  Physical Exam Vitals and nursing note reviewed.  Constitutional:      Appearance: She is well-developed. She is not diaphoretic.  HENT:     Head: Normocephalic and atraumatic.     Right Ear: External ear normal.     Left Ear: External ear normal.  Eyes:     General: No scleral icterus.       Right eye: No discharge.        Left eye: No discharge.     Conjunctiva/sclera: Conjunctivae normal.  Neck:     Trachea: No tracheal deviation.  Cardiovascular:     Rate and Rhythm: Normal rate and regular rhythm.  Pulmonary:     Effort: Pulmonary effort is normal. No respiratory distress.     Breath sounds: No stridor. Examination of the right-lower field reveals rales. Examination of the left-lower field reveals rales. Rales present. No wheezing.   Abdominal:     General: Bowel sounds are normal. There is no distension.     Palpations: Abdomen is soft.     Tenderness: There is no abdominal tenderness. There is left CVA tenderness. There is no guarding or rebound.     Comments: Mild tenderness palpation left costal vertebral angle  Musculoskeletal:        General: No tenderness or deformity.     Cervical back: Neck supple.     Right lower leg: Edema present.     Left lower leg: Edema present.  Skin:    General: Skin is warm and dry.     Findings: No rash.  Neurological:     General: No focal deficit present.     Mental Status: She is alert.     Cranial Nerves: No cranial nerve deficit (no facial droop, extraocular movements intact, no slurred speech).     Sensory: No sensory deficit.     Motor: No abnormal muscle tone or seizure activity.     Coordination: Coordination normal.  Psychiatric:        Mood and Affect: Mood normal.     ED Results / Procedures / Treatments   Labs (all labs ordered are listed, but only abnormal results are displayed) Labs Reviewed  COMPREHENSIVE METABOLIC PANEL - Abnormal; Notable for the following components:      Result Value   Calcium 8.5 (*)    AST 13 (*)    All other components within normal limits  CBC - Abnormal; Notable for the following components:   Hemoglobin 11.4 (*)    MCH 24.3 (*)    MCHC 28.8 (*)    RDW 17.2 (*)    All other components within normal limits  BLOOD GAS, VENOUS - Abnormal; Notable for the following components:   pO2, Ven 31 (*)    Bicarbonate 32.2 (*)    Acid-Base Excess 4.7 (*)    All other components within normal limits  D-DIMER, QUANTITATIVE - Abnormal; Notable for the following components:   D-Dimer, Quant 0.86 (*)    All other components within normal limits  RESP PANEL BY RT-PCR (FLU A&B, COVID) ARPGX2  URINALYSIS, ROUTINE W REFLEX MICROSCOPIC    EKG None  Radiology CT Angio Chest PE W and/or Wo Contrast  Result Date: 08/15/2021 CLINICAL  DATA:  Back pain for 2 days, concern for pulmonary embolism. EXAM: CT ANGIOGRAPHY CHEST WITH CONTRAST TECHNIQUE: Multidetector CT imaging of the chest was performed using the standard protocol during bolus administration of intravenous contrast. Multiplanar CT image reconstructions and MIPs were obtained  to evaluate the vascular anatomy. RADIATION DOSE REDUCTION: This exam was performed according to the departmental dose-optimization program which includes automated exposure control, adjustment of the mA and/or kV according to patient size and/or use of iterative reconstruction technique. CONTRAST:  101m OMNIPAQUE IOHEXOL 350 MG/ML SOLN COMPARISON:  CT chest dated 11/02/2020. FINDINGS: Cardiovascular: Satisfactory opacification of the pulmonary arteries to the segmental level. Filling defects are noted in segmental and subsegmental pulmonary arteries supplying the right middle lobe (series 5, image [id number]) and left lower lobe (series 5, image [id number]). The right and left pulmonary arteries are enlarged, each measuring 2.4 cm in size. Vascular calcifications are seen in the coronary arteries and aortic arch. Heart is enlarged. No pericardial effusion. Mediastinum/Nodes: No enlarged mediastinal, hilar, or axillary lymph nodes. Thyroid gland and esophagus demonstrate no significant findings. Lungs/Pleura: Aspirated debris is seen in the trachea. Bilateral lower lung predominant bronchiectasis with associated mild ground-glass opacities likely reflect chronic aspiration. There is mild bilateral lower lobe atelectasis. No pleural effusion or pneumothorax. Upper Abdomen: No acute abnormality. Musculoskeletal: Degenerative changes are seen in the spine. Review of the MIP images confirms the above findings. IMPRESSION: 1. Filling defects in segmental and subsegmental pulmonary artery supplying the right middle lobe and left lower lobe are most consistent with acute pulmonary emboli. 2. Aspirated debris in the trachea as  well as bilateral lower lung predominant bronchiectasis and mild ground-glass opacities likely reflect chronic aspiration possible superimposed pneumonia. 3. Enlarged right and left pulmonary arteries, suggestive of pulmonary hypertension. Aortic Atherosclerosis (ICD10-I70.0). Electronically Signed   By: TZerita BoersM.D.   On: 08/15/2021 17:25   DG Chest Port 1 View  Result Date: 08/15/2021 CLINICAL DATA:  Shortness of breath EXAM: PORTABLE CHEST 1 VIEW COMPARISON:  03/13/2021 FINDINGS: Gross cardiomegaly. Mild, diffuse bilateral interstitial pulmonary opacity. The visualized skeletal structures are unremarkable. IMPRESSION: Gross cardiomegaly with mild, diffuse bilateral interstitial pulmonary opacity, likely edema. No focal airspace opacity. Electronically Signed   By: ADelanna AhmadiM.D.   On: 08/15/2021 15:16    Procedures Procedures    Medications Ordered in ED Medications  iohexol (OMNIPAQUE) 350 MG/ML injection 100 mL (has no administration in time range)  iohexol (OMNIPAQUE) 350 MG/ML injection 100 mL (75 mLs Intravenous Contrast Given 08/15/21 1658)    ED Course/ Medical Decision Making/ A&P Clinical Course as of 08/15/21 1745  Wed Aug 15, 2021  1631 [facility] Normal venous pH [JK]  1631 Venous PCO2 upper limits of normal [JK]  1632 Comprehensive metabolic panel(!) Normal [JK]  1632 CBC(!) Normal [JK]  1632 Chest x-ray images and radiology report reviewed, cardiomegaly mild interstitial opacity noted [JK]  1730 CT Angio Chest PE W and/or Wo Contrast CT scan suggests pulmonary embolism.  Possible possible component of chronic aspiration and pneumonia.  No fever at this time.  Patient not complaining of cough will hold off on antibiotics [JK]  1744 Case discussed with Dr. STamala Julian[JK]    Clinical Course User Index [JK] KDorie Rank MD                           Medical Decision Making Differential diagnosis includes but not limited to  pneumonia or pneumothorax, UTI, pulm embolism  Problems Addressed: Acute pulmonary embolism with acute cor pulmonale, unspecified pulmonary embolism type ([facility]: acute illness or injury that poses a threat to life or bodily functions Chronic obstructive pulmonary disease, unspecified COPD type ([place]:  chronic illness or injury Oxygen dependent: chronic illness or injury  Amount and/or Complexity of Data Reviewed External Data Reviewed: notes.    Details: Reviewed discharge summary from earlier this year.  Patient is supposed to be on 3 L nasal cannula oxygen at home Labs: ordered. Decision-making details documented in ED Course. Radiology: ordered and independent interpretation performed. Decision-making details documented in ED Course.    Details: Chest x-ray without pneumonia or pneumothorax  Risk Prescription drug management. Drug therapy requiring intensive monitoring for toxicity. Decision regarding hospitalization.   Patient presented to the ED for evaluation of thoracic back pain.  No evidence of pneumonia on x-ray.  Patient does have an oxygen requirement.  Old records reviewed and she is supposed to be on 3 L of nasal cannula oxygen.  Patient does not compliant with that regimen at home consistently.  No fevers or chills.  ED work-up did show an elevated D-dimer.  CT angiogram was performed and it does show evidence of a pulmonary embolism.  There is also question of aspiration and pneumonia although patient is afebrile has not been coughing we will hold off on antibiotics at this time and continue to monitor.  Patient has started on IV heparin.  Will admit for further treatment       Final Clinical Impression(s) / ED Diagnoses Final diagnoses:  Acute pulmonary embolism with acute cor pulmonale, unspecified pulmonary embolism type ([place])  Chronic obstructive pulmonary disease, unspecified COPD type ([place])  Oxygen dependent    Rx / DC Orders ED Discharge Orders     None          Dorie Rank, MD 08/15/21 1749

## 2021-08-15 NOTE — ED Notes (Signed)
Nasal cannula applied at 2 lpm for hypoxia. Pt was 82%on RA and now 92% on 2 LPm. Pt does not use oxygen at home.

## 2021-08-15 NOTE — H&P (Signed)
History and Physical    Patient: Marisa Bailey WNI:[id number] DOB: May 12, 1940 DOA: 08/15/2021 DOS: the patient was seen and examined on 08/15/2021 PCP: [name], MD  Patient coming from: Home  Chief Complaint:  Chief Complaint  Patient presents with   Back Pain   Shortness of Breath   HPI: Marisa Bailey is a 81 y.o. female with medical history significant of hypertension, CHF, COPD, chronic respiratory failure,  aspiration pneumonia, tobacco abuse, and blindness out of the right eye presents with complaints of left-sided flank pain starting yesterday.  She states that the pain seemed to go away, but when she woke up this morning it was present again.  She has had some lower extremity swelling that she feels is worse on her right leg.  Patient denies having any significant fever, abdominal pain, nausea, vomiting, diarrhea, lightheadedness, travel, or prolonged immobilization.  Normally she gets around without use of any assistance.  She did not have on her oxygen today as she was told that she did not have to wear it 24/7.  On admission into the emergency department patient was noted to have O2 saturations as low as 82% on room air with improvement on 2 L of nasal cannula oxygen to 96%, and all other vital signs maintained.  ABG noted pH 7.33 with PCO2 60, PO2 31.  Labs noted hemoglobin 11.4, MCH 24.3, calcium 8.5, and D-dimer 0.86.  Urinalysis noted to have elevated specific gravity with significant amount of squamous epithelial cells.  CTA of the chest noted filling defects within the segmental and subsegmental pulmonary arteries supplying the right middle lobe and left lower lobe consistent with acute pulmonary emboli.  There is also note of aspirated free in the trachea and bilateral lower lobes reflecting possible chronic aspiration possible superimposed pneumonia.   Review of Systems: As mentioned in the history of present illness. All other systems reviewed and are negative. Past  Medical History:  Diagnosis Date   Aspiration pneumonia ([place]) 11/03/2020   Blind right eye    CHF (congestive heart failure) (HCC)    COPD (chronic obstructive pulmonary disease) ([place])    Fx upper humerus-closed 08/17/2012   Hypertension    Past Surgical History:  Procedure Laterality Date   ENUCLEATION     Social History:  reports that she has been smoking cigarettes. She has a 18.00 pack-year smoking history. She has never used smokeless tobacco. She reports that she does not currently use alcohol. She reports that she does not use drugs.  Allergies  Allergen Reactions   Penicillins Rash    Has patient had a PCN reaction causing immediate rash, facial/tongue/throat swelling, SOB or lightheadedness with hypotension: {Yes/No:[id number] Has patient had a PCN reaction causing severe rash involving mucus membranes or skin necrosis: Yes Has patient had a PCN reaction that required hospitalization No Has patient had a PCN reaction occurring within the last 10 years: No If all of the above answers are "NO", then may proceed with Cephalosporin use.  **Has tolerated keflex     Family History  Problem Relation Age of Onset   Diabetes Mother     Prior to Admission medications   Medication Sig Start Date End Date Taking? Authorizing Provider  albuterol (VENTOLIN HFA) 108 (90 Base) MCG/ACT inhaler Inhale 2 puffs into the lungs every 6 (six) hours as needed for wheezing or shortness of breath. 03/15/21  Yes Mercy Riding, MD  amLODipine (NORVASC) 10 MG tablet Take 1 tablet (10 mg total) by mouth  daily. 05/03/21  Yes [name], MD  aspirin EC 81 MG tablet Take 1 tablet (81 mg total) by mouth daily with breakfast. 05/28/19  Yes Emokpae, Courage, MD  atorvastatin (LIPITOR) 20 MG tablet TAKE 1 TABLET BY MOUTH DAILY 05/30/21  Yes [name], MD  Cholecalciferol (VITAMIN D3) 25 MCG (1000 UT) CAPS Take 2 capsules by mouth daily.   Yes [provider]  folic acid (FOLVITE) 1 MG tablet  Take 1 tablet (1 mg total) by mouth daily. 06/14/21  Yes [name], MD  meloxicam (MOBIC) 7.5 MG tablet Take 1 tablet by mouth once daily 07/30/21  Yes [name], MD  metFORMIN (GLUCOPHAGE) 500 MG tablet Take 1 tablet (500 mg total) by mouth 2 (two) times daily. 06/14/21  Yes [name], MD  olmesartan (BENICAR) 40 MG tablet TAKE 1 TABLET BY MOUTH DAILY 06/18/21  Yes [name], MD  albuterol (PROVENTIL) (2.5 MG/3ML) 0.083% nebulizer solution Take 3 mLs (2.5 mg total) by nebulization every 6 (six) hours as needed for wheezing or shortness of breath. Patient not taking: Reported on 08/15/2021 03/15/21   Mercy Riding, MD  ILEVRO 0.3 % ophthalmic suspension 1 drop daily. 08/06/21   [provider]  moxifloxacin (VIGAMOX) 0.5 % ophthalmic solution Apply 1 drop to eye 3 (three) times daily. 08/06/21   [provider]  nicotine (NICODERM CQ - DOSED IN MG/24 HR) 7 mg/24hr patch Place 1 patch (7 mg total) onto the skin daily. Patient not taking: Reported on 08/15/2021 06/06/21   Chesley Mires, MD  prednisoLONE acetate (PRED FORTE) 1 % ophthalmic suspension 1 drop 3 (three) times daily. 08/06/21   [provider]    Physical Exam: Vitals:   08/15/21 1443 08/15/21 1600 08/15/21 1700 08/15/21 1800  BP:  (!) 133/55 129/76 (!) 133/54  Pulse:  90 65 71  Resp:  _0 Temp:      TempSrc:      SpO2: 92% 92% 96% 95%  Weight:      Height:        Constitutional: Elderly female currently in no acute distress. Eyes: Right eyelid closed.  Left eye extraocular movements intact. ENMT: Mucous membranes are moist.   Mildly hard of hearing. Neck: normal, supple, no JVD. Respiratory: Normal respiratory effort currently on 2 L of nasal cannula oxygen with Rales heard in the bases. Cardiovascular: Regular rate and rhythm, no murmurs / rubs / gallops +1 pitting edema of the bilateral lower extremities.  Abdomen: Mild left CVA tenderness appreciated.  Bowel sounds present all 4  quadrants Musculoskeletal: no clubbing / cyanosis. No joint deformity upper and lower extremities. Good ROM, no contractures. Normal muscle tone.  Skin: no rashes, lesions, ulcers. No induration Neurologic: CN 2-12 grossly intact. Strength 5/5 in all 4.  Psychiatric: Normal judgment and insight. Alert and oriented x 3. Normal mood.   Data Reviewed:  Reviewed labs, imaging, and pertinent records as noted above in the HPI  Assessment and Plan: Pulmonary embolus Acute.  Patient presented with complaints of left-sided flank pain.  D-dimer was just barely elevated 0.86.  CT angiogram revealed segmental and subsegmental pulmonary artery supplying the right middle lobe and left lower lobe consistent with pulmonary emboli.  Patient had been started on heparin drip.  No clear cause appreciated at this time for embolus. 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  O2 saturations initially noted to be as low as 82% with improvement on 2 L nasal cannula oxygen.  No significant wheezing or rhonchi appreciated on physical exam.  Previously noted RVSP of 20.8 mmHg on TTE on 02/28/2021.  CT imaging also gave concern for pulmonary artery hypertension -Continue nasal cannula oxygen 24/7 advised patient to do the same -Breathing treatments as needed  Possible aspiration pneumonia CT imaging noted possible concern for aspiration pneumonia.  Patient without fever or elevation white blood cell count at this time. -Aspiration precautions with elevation of the head of  the bed -Check procalcitonin  Hypochromic anemia Chronic.  Hemoglobin 11.4 g/dL with low MCH which appears near patient's baseline.  Patient denied any reports of bleeding -Check CBC tomorrow morning  Prediabetes On admission glucose 92.  Patient appears to be well controlled.  Home medication regimen includes metformin 500 mg twice daily.  Last available hemoglobin A1c 5.5 in 2022. -Continue heart healthy carb modified diet  Essential hypertension Home blood pressure regimen includes amlodipine 10 mg daily and olmesartan 40 mg daily. -Continue home regimen with pharmacy substitution.  Hypocalcemia Acute.  Calcium 8.5 on admission with albumin within normal limits. -Give 1 g of calcium gluconate IV -Continue to monitor and replace as needed  Blindness in right eye  Tobacco abuse -Continue to counsel patient on need for cessation of tobacco use  DVT prophylaxis: Heparin Advance Care Planning:   Code Status: Full Code  Consults: None  Family Communication: Called patient's niece to give her an update left voicemail.  Severity of Illness: The appropriate patient status for this patient is OBSERVATION. Observation status is judged to be reasonable and necessary in order to provide the required intensity of service to ensure the patient's safety. The patient's presenting symptoms, physical exam findings, and initial radiographic and laboratory data in the context of their medical condition is felt to place them at decreased risk for further clinical deterioration. Furthermore, it is anticipated that the patient will be medically stable for discharge from the hospital within 2 midnights of admission.   Author: Norval Morton, MD 08/15/2021 6:45 PM  For on call review [email or web address].

## 2021-08-16 ENCOUNTER — Observation Stay (HOSPITAL_COMMUNITY): Payer: Medicare Other

## 2021-08-16 ENCOUNTER — Observation Stay (HOSPITAL_BASED_OUTPATIENT_CLINIC_OR_DEPARTMENT_OTHER): Payer: Medicare Other

## 2021-08-16 DIAGNOSIS — H5440 Blindness, one eye, unspecified eye: Secondary | ICD-10-CM | POA: Diagnosis not present

## 2021-08-16 DIAGNOSIS — I2723 Pulmonary hypertension due to lung diseases and hypoxia: Secondary | ICD-10-CM | POA: Diagnosis not present

## 2021-08-16 DIAGNOSIS — D509 Iron deficiency anemia, unspecified: Secondary | ICD-10-CM | POA: Diagnosis not present

## 2021-08-16 DIAGNOSIS — J9611 Chronic respiratory failure with hypoxia: Secondary | ICD-10-CM | POA: Diagnosis not present

## 2021-08-16 DIAGNOSIS — I10 Essential (primary) hypertension: Secondary | ICD-10-CM | POA: Diagnosis not present

## 2021-08-16 DIAGNOSIS — I2699 Other pulmonary embolism without acute cor pulmonale: Secondary | ICD-10-CM | POA: Diagnosis not present

## 2021-08-16 DIAGNOSIS — I2693 Single subsegmental pulmonary embolism without acute cor pulmonale: Secondary | ICD-10-CM | POA: Diagnosis not present

## 2021-08-16 DIAGNOSIS — I2609 Other pulmonary embolism with acute cor pulmonale: Secondary | ICD-10-CM

## 2021-08-16 DIAGNOSIS — J449 Chronic obstructive pulmonary disease, unspecified: Secondary | ICD-10-CM | POA: Diagnosis not present

## 2021-08-16 DIAGNOSIS — J439 Emphysema, unspecified: Secondary | ICD-10-CM | POA: Diagnosis not present

## 2021-08-16 DIAGNOSIS — Z720 Tobacco use: Secondary | ICD-10-CM | POA: Diagnosis not present

## 2021-08-16 LAB — ECHOCARDIOGRAM COMPLETE
AR max vel: 2.85 cm2
AV Area VTI: 3.12 cm2
AV Area mean vel: 2.83 cm2
AV Mean grad: 3 mmHg
AV Peak grad: 6.3 mmHg
Ao pk vel: 1.26 m/s
Area-P 1/2: 4.31 cm2
Height: 64 in
MV VTI: 2.73 cm2
S' Lateral: 3.2 cm
Weight: 2488 oz

## 2021-08-16 LAB — CBC
HCT: 36.7 % (ref 36.0–46.0)
Hemoglobin: 10.6 g/dL — ABNORMAL LOW (ref 12.0–15.0)
MCH: 24.6 pg — ABNORMAL LOW (ref 26.0–34.0)
MCHC: 28.9 g/dL — ABNORMAL LOW (ref 30.0–36.0)
MCV: 85.2 fL (ref 80.0–100.0)
Platelets: 213 10*3/uL (ref 150–400)
RBC: 4.31 MIL/uL (ref 3.87–5.11)
RDW: 17.6 % — ABNORMAL HIGH (ref 11.5–15.5)
WBC: 5.4 10*3/uL (ref 4.0–10.5)
nRBC: 0.4 % — ABNORMAL HIGH (ref 0.0–0.2)

## 2021-08-16 LAB — BASIC METABOLIC PANEL
Anion gap: 6 (ref 5–15)
BUN: 18 mg/dL (ref 8–23)
CO2: 25 mmol/L (ref 22–32)
Calcium: 8.3 mg/dL — ABNORMAL LOW (ref 8.9–10.3)
Chloride: 111 mmol/L (ref 98–111)
Creatinine, Ser: 0.81 mg/dL (ref 0.44–1.00)
GFR, Estimated: >60 mL/min (ref >60)
Glucose, Bld: 88 mg/dL (ref 70–99)
Potassium: 4 mmol/L (ref 3.5–5.1)
Sodium: 142 mmol/L (ref 135–145)

## 2021-08-16 LAB — HEPARIN LEVEL (UNFRACTIONATED)
Heparin Unfractionated: 0.39 IU/mL (ref 0.30–0.70)
Heparin Unfractionated: 0.67 IU/mL (ref 0.30–0.70)

## 2021-08-16 MED ORDER — APIXABAN 5 MG PO TABS: 10.0000 mg | ORAL_TABLET | Freq: Two times a day (BID) | ORAL | Status: DC

## 2021-08-16 MED ORDER — PANTOPRAZOLE SODIUM 40 MG PO TBEC: 40.0000 mg | DELAYED_RELEASE_TABLET | Freq: Every day | ORAL | 1 refills | Status: DC

## 2021-08-16 MED ORDER — APIXABAN 5 MG PO TABS: 5.0000 mg | ORAL_TABLET | Freq: Two times a day (BID) | ORAL | Status: DC

## 2021-08-16 MED ORDER — HYDROCODONE-ACETAMINOPHEN 5-325 MG PO TABS: 1.0000 | ORAL_TABLET | Freq: Four times a day (QID) | ORAL | 0 refills | Status: DC | PRN

## 2021-08-16 MED ORDER — APIXABAN 5 MG PO TABS: ORAL_TABLET | ORAL | 4 refills | Status: DC

## 2021-08-16 NOTE — Progress Notes (Signed)
ANTICOAGULATION CONSULT NOTE - Follow Up Consult  Pharmacy Consult for heparin Indication: pulmonary embolus  Labs: Recent Labs    08/15/21 1438 08/16/21 0103  HGB 11.4* 10.6*  HCT 39.6 36.7  PLT 224 213  HEPARINUNFRC  --  0.39  CREATININE 0.94 0.81    Assessment: 81yo female therapeutic on heparin with initial dosing for PE but at lower end of goal; no infusion issues or signs of bleeding per RN.  Goal of Therapy:  Heparin level 0.3-0.7 units/ml   Plan:  Will increase heparin infusion slightly to 1200 units/hr and check level in 8 hours.    Wynona Neat, PharmD, BCPS  08/16/2021,2:27 AM

## 2021-08-16 NOTE — Assessment & Plan Note (Signed)
-  Pneumonia has been ruled out -No difficulty swallowing appreciated throughout hospitalization. -No antibiotics given.

## 2021-08-16 NOTE — Assessment & Plan Note (Signed)
-  Cessation counseling provided.

## 2021-08-16 NOTE — Progress Notes (Signed)
  Transition of Care [facility]) Screening Note   Patient Details  Name: Marisa Bailey Date of Birth: Sep 28, 1940   Transition of Care [facility] ) CM/SW Contact:    Ihor Gully, LCSW Phone Number: 08/16/2021, 9:39 AM    Transition of Care Department [facility]) has reviewed patient and no TOC needs have been identified at this time. We will continue to monitor patient advancement through interdisciplinary progression rounds. If new patient transition needs arise, please place a TOC consult.

## 2021-08-16 NOTE — Assessment & Plan Note (Signed)
-  Overall stable and well-controlled -Continue home antihypertensive agent -Patient educated about following low-sodium diet and maintaining adequate hydration.

## 2021-08-16 NOTE — Plan of Care (Signed)
Pt alert and oriented x4. Currently on bedrest due to pending [place] BLE. Pt informed upon admission. Purewick placed. [employer] notified as well. Vitals stable. Pt denies pain. Heparin drip infusing and increased to 12 ml this shift per Pharmacy recommendations.  Problem: Education: Goal: Knowledge of General Education information will improve Description: Including pain rating scale, medication(s)/side effects and non-pharmacologic comfort measures Outcome: Progressing   Problem: Health Behavior/Discharge Planning: Goal: Ability to manage health-related needs will improve Outcome: Progressing   Problem: Clinical Measurements: Goal: Ability to maintain clinical measurements within normal limits will improve Outcome: Progressing Goal: Will remain free from infection Outcome: Progressing Goal: Diagnostic test results will improve Outcome: Progressing Goal: Respiratory complications will improve Outcome: Progressing Goal: Cardiovascular complication will be avoided Outcome: Progressing   Problem: Activity: Goal: Risk for activity intolerance will decrease Outcome: Progressing   Problem: Nutrition: Goal: Adequate nutrition will be maintained Outcome: Progressing   Problem: Coping: Goal: Level of anxiety will decrease Outcome: Progressing   Problem: Elimination: Goal: Will not experience complications related to bowel motility Outcome: Progressing Goal: Will not experience complications related to urinary retention Outcome: Progressing   Problem: Pain Managment: Goal: General experience of comfort will improve Outcome: Progressing   Problem: Safety: Goal: Ability to remain free from injury will improve Outcome: Progressing   Problem: Skin Integrity: Goal: Risk for impaired skin integrity will decrease Outcome: Progressing   Problem: Education: Goal: Knowledge of General Education information will improve Description: Including pain rating scale, medication(s)/side  effects and non-pharmacologic comfort measures Outcome: Progressing   Problem: Health Behavior/Discharge Planning: Goal: Ability to manage health-related needs will improve Outcome: Progressing   Problem: Clinical Measurements: Goal: Ability to maintain clinical measurements within normal limits will improve Outcome: Progressing Goal: Will remain free from infection Outcome: Progressing Goal: Diagnostic test results will improve Outcome: Progressing Goal: Respiratory complications will improve Outcome: Progressing Goal: Cardiovascular complication will be avoided Outcome: Progressing   Problem: Activity: Goal: Risk for activity intolerance will decrease Outcome: Progressing   Problem: Nutrition: Goal: Adequate nutrition will be maintained Outcome: Progressing   Problem: Coping: Goal: Level of anxiety will decrease Outcome: Progressing   Problem: Elimination: Goal: Will not experience complications related to bowel motility Outcome: Progressing Goal: Will not experience complications related to urinary retention Outcome: Progressing   Problem: Pain Managment: Goal: General experience of comfort will improve Outcome: Progressing   Problem: Safety: Goal: Ability to remain free from injury will improve Outcome: Progressing   Problem: Skin Integrity: Goal: Risk for impaired skin integrity will decrease Outcome: Progressing

## 2021-08-16 NOTE — Assessment & Plan Note (Signed)
-  Overall stable hemoglobin levels appreciated -Continue avoiding the use of NSAIDs and closely follow hemoglobin trend/stability. -No bleeding appreciated.

## 2021-08-16 NOTE — Assessment & Plan Note (Signed)
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 -She received 24 hours of IV heparin and will be discharged on Eliquis. -Close monitoring of basic metabolic panel and CBC as an outpatient.

## 2021-08-16 NOTE — Plan of Care (Signed)
Goals met 

## 2021-08-16 NOTE — Assessment & Plan Note (Signed)
-  Overall stable and not demonstrating exacerbation -Resume home bronchodilator management.

## 2021-08-16 NOTE — Progress Notes (Signed)
ANTICOAGULATION CONSULT NOTE - Follow Up Consult  Pharmacy Consult for heparin Indication: pulmonary embolus  Labs: Recent Labs    08/15/21 1438 08/16/21 0103 08/16/21 0951  HGB 11.4* 10.6*  --   HCT 39.6 36.7  --   PLT 224 213  --   HEPARINUNFRC  --  0.39 0.67  CREATININE 0.94 0.81  --      Assessment: 81yo female therapeutic on heparin with initial dosing for PE at goal; no infusion issues or signs of bleeding per RN.  Goal of Therapy:  Heparin level 0.3-0.7 units/ml   Plan:  Continue heparin infusion at 1200 units/hr and check level in AM   Marisa Bailey, PharmD, [facility] Clinical Pharmacist  08/16/2021,10:44 AM

## 2021-08-16 NOTE — Assessment & Plan Note (Signed)
-  Continue patient follow-up with ophthalmology. -Resume home eyedrops regimen.

## 2021-08-16 NOTE — Progress Notes (Signed)
ANTICOAGULATION CONSULT NOTE - Follow Up Consult  Pharmacy Consult for heparin >> APIXABAN Indication: pulmonary embolus  Labs: Recent Labs    08/15/21 1438 08/16/21 0103 08/16/21 0951  HGB 11.4* 10.6*  --   HCT 39.6 36.7  --   PLT 224 213  --   HEPARINUNFRC  --  0.39 0.67  CREATININE 0.94 0.81  --     Assessment: 81yo female therapeutic on heparin with initial dosing for PE at goal; no infusion issues or signs of bleeding per RN.  Asked to transition to Apixaban  Goal of Therapy:  Heparin level 0.3-0.7 units/ml   Plan:  Stop IV Heparin at 1759 and begin Apixaban 71m po BID x 7 days then 524mPO bid thereafter. F/U apixaban edu Monitor for s/sx bleeding complications   ScHart RobinsonsPharmD Clinical Pharmacist  08/16/2021,2:08 PM

## 2021-08-16 NOTE — Assessment & Plan Note (Signed)
-  In the setting of COPD and very likely component from blood clots. -Patient will benefit of follow-up with pulmonologist as an outpatient.

## 2021-08-16 NOTE — Progress Notes (Signed)
*  PRELIMINARY RESULTS* Echocardiogram 2D Echocardiogram has been performed.  Marisa Bailey 08/16/2021, 12:12 PM

## 2021-08-16 NOTE — Discharge Summary (Signed)
Physician Discharge Summary   Patient: Marisa Bailey MRN: [id number] DOB: [birth year]  Admit date:     08/15/2021  Discharge date: 08/16/21  Discharge Physician: Barton Dubois   PCP: [name], MD   Recommendations at discharge:  Repeat CBC to follow hemoglobin and stability/trend Repeat basic metabolic panel to follow electrolytes and renal function. Close monitoring of patient volume status to determine the need of diuretic therapy initiation. Outpatient follow-up with pulmonologist recommended.  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 Tamala Julian on 08/15/2021 Marisa Bailey is a 81 y.o. female with medical history significant of hypertension, CHF, COPD, chronic respiratory failure,  aspiration pneumonia, tobacco abuse, and blindness out of the right eye presents with complaints of left-sided flank pain starting yesterday.  She states that the pain seemed to go away, but when she woke up this morning it was present again.  She has had some lower extremity swelling that she feels is worse on her right leg.  Patient denies having any significant fever, abdominal pain, nausea, vomiting, diarrhea, lightheadedness, travel, or prolonged immobilization.  Normally she gets around without use of any assistance.  She did not have on her oxygen today as she was told that she did not have to wear it 24/7.   On admission into the emergency department patient was noted to have O2 saturations as low as 82% on room air with improvement on 2 L of nasal cannula oxygen to 96%, and all other vital signs maintained.  ABG noted pH 7.33  with PCO2 60, PO2 31.  Labs noted hemoglobin 11.4, MCH 24.3, calcium 8.5, and D-dimer 0.86.  Urinalysis noted to have elevated specific gravity with significant amount of squamous epithelial cells.  CTA of the chest noted filling defects within the segmental and subsegmental pulmonary arteries supplying the right middle lobe and left lower lobe consistent with acute pulmonary emboli.  There is also note of aspirated free in the trachea and bilateral lower lobes reflecting possible chronic aspiration possible superimposed pneumonia.  Assessment and Plan: * Pulmonary emboli ([place]) - With mild to moderate cor pulmonale -Hemodynamically stable -Normal ejection fraction, blood pressure, saturation and not burden clots in her legs after Dopplers examination. -She received 24 hours of IV heparin and will be discharged on Eliquis. -Close monitoring of basic metabolic panel and CBC as an outpatient.  Aspiration pneumonia ([place]) - Pneumonia has been ruled out -No difficulty swallowing appreciated throughout hospitalization. -No antibiotics given.  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 -Patient will benefit of follow-up with pulmonologist as an outpatient.  Chronic respiratory failure with hypoxia (HCC) -Continue chronic oxygen supplementation -Education and instructions to be compliant with oxygen supplementation provided.  COPD (chronic obstructive pulmonary disease) (HCC) - Overall stable and not demonstrating exacerbation -Resume home bronchodilator management.  Hypochromic anemia - Overall stable hemoglobin levels appreciated -Continue avoiding the use of NSAIDs and closely follow hemoglobin trend/stability. -No bleeding appreciated.  Prediabetes - Continue modified carbohydrate diet -Close monitoring of patient's CBGs recommended.  Essential hypertension - Overall stable and  well-controlled -Continue home  antihypertensive agent -Patient educated about following low-sodium diet and maintaining adequate hydration.  Hypocalcemia - Continue calcium supplementation.  Blindness of right eye - Continue patient follow-up with ophthalmology. -Resume home eyedrops regimen.  Tobacco abuse - Cessation counseling provided.  Chronic diastolic heart failure -PET echo results grade 2 dysfunction -Currently compensated -Continue low-sodium diet -Follow volume stability.  Consultants: None Procedures performed: See below for x-ray reports. Disposition: Home Diet recommendation: Heart healthy/low-sodium diet.  DISCHARGE MEDICATION: Allergies as of 08/16/2021       Reactions   Penicillins Rash   Has patient had a PCN reaction causing immediate rash, facial/tongue/throat swelling, SOB or lightheadedness with hypotension: {Yes/No:[id number] Has patient had a PCN reaction causing severe rash involving mucus membranes or skin necrosis: Yes Has patient had a PCN reaction that required hospitalization No Has patient had a PCN reaction occurring within the last 10 years: No If all of the above answers are "NO", then may proceed with Cephalosporin use. **Has tolerated keflex        Medication List     STOP taking these medications    aspirin EC 81 MG tablet   meloxicam 7.5 MG tablet Commonly known as: MOBIC       TAKE these medications    albuterol (2.5 MG/3ML) 0.083% nebulizer solution Commonly known as: PROVENTIL Take 3 mLs (2.5 mg total) by nebulization every 6 (six) hours as needed for wheezing or shortness of breath.   albuterol 108 (90 Base) MCG/ACT inhaler Commonly known as: VENTOLIN HFA Inhale 2 puffs into the lungs every 6 (six) hours as needed for wheezing or shortness of breath.   amLODipine 10 MG tablet Commonly known as: NORVASC Take 1 tablet (10 mg total) by mouth daily.   apixaban 5 MG Tabs tablet Commonly known as: ELIQUIS Take 2 tablets by mouth twice a day 7 days;  then 1 tablet by mouth twice a day.   atorvastatin 20 MG tablet Commonly known as: LIPITOR TAKE 1 TABLET BY MOUTH DAILY   folic acid 1 MG tablet Commonly known as: FOLVITE Take 1 tablet (1 mg total) by mouth daily.   HYDROcodone-acetaminophen 5-325 MG tablet Commonly known as: NORCO/VICODIN Take 1 tablet by mouth every 6 (six) hours as needed for severe pain.   Ilevro 0.3 % ophthalmic suspension Generic drug: nepafenac 1 drop daily.   metFORMIN 500 MG tablet Commonly known as: GLUCOPHAGE Take 1 tablet (500 mg total) by mouth 2 (two) times daily.   moxifloxacin 0.5 % ophthalmic solution Commonly known as: VIGAMOX Apply 1 drop to eye 3 (three) times daily.   nicotine 7 mg/24hr patch Commonly known as: NICODERM CQ - dosed in mg/24 hr Place 1 patch (7 mg total) onto the skin daily.   olmesartan 40 MG tablet Commonly known as: BENICAR TAKE 1 TABLET BY MOUTH DAILY   pantoprazole 40 MG tablet Commonly known as: Protonix Take 1 tablet (40 mg total) by mouth daily.   prednisoLONE acetate 1 % ophthalmic suspension Commonly known as: PRED FORTE 1 drop 3 (three) times daily.   Vitamin D3 25 MCG (1000 UT) Caps Take 2 capsules by mouth daily.        Follow-up Information     [name], MD. Schedule an appointment as soon as possible for a visit in 2 week(s).   Specialty: Internal Medicine Contact information: [address] [place] [address] [phone]         Donato Heinz, MD .  Specialties: Cardiology, Radiology Contact information: [address] [place] [address] [phone]                Discharge Exam: Danley Danker Weights   08/15/21 1434  Weight: 70.5 kg   General exam: Alert, awake, oriented x 3; no chest pain, no nausea, no vomiting.  Reports breathing essentially at baseline and demonstrating good saturation with oxygen supplementation.  Feeling ready to go home. Respiratory system: Positive scattered  rhonchi; no wheezing, no using accessory muscles, no tachypnea. Cardiovascular system:Rate controlled, no rubs, no gallops, no JVD on exam. Gastrointestinal system: Abdomen is nondistended, soft and nontender. No organomegaly or masses felt. Normal bowel sounds heard. Central nervous system: Alert and oriented. No focal neurological deficits. Extremities: No cyanosis or clubbing. Skin: No petechiae. Psychiatry: Judgement and insight appear normal. Mood & affect appropriate.    Condition at discharge: Stable and improved.  The results of significant diagnostics from this hospitalization (including imaging, microbiology, ancillary and laboratory) are listed below for reference.   Imaging Studies: ECHOCARDIOGRAM COMPLETE  Result Date: 08/16/2021    ECHOCARDIOGRAM REPORT   Patient Name:   Addilyne E Carrigg Date of Exam: 08/16/2021 Medical Rec #:  [id number]      Height:       64.0 in Accession #:    [id number]     Weight:       155.5 lb Date of Birth:  1940/08/10       BSA:          1.758 m Patient Age:    18 years       BP:           130/53 mmHg Patient Gender: F              HR:           61 bpm. Exam Location:  [facility] Procedure: 2D Echo, Cardiac Doppler and Color Doppler Indications:    Pulmonary embolus  History:        Patient has prior history of Echocardiogram examinations, most                 recent 02/28/2021. CHF, Pulmonary HTN and COPD; Risk                 Factors:Hypertension, Current Smoker and Dyslipidemia.  Sonographer:    Wenda Low Referring Phys: [id number] RONDELL A SMITH IMPRESSIONS  1. Left ventricular ejection fraction, by estimation, is 55 to 60%. The left ventricle has normal function. The left ventricle has no regional wall motion abnormalities. Left ventricular diastolic parameters are consistent with Grade II diastolic dysfunction (pseudonormalization).  2. Right ventricular systolic function is mildly reduced. The right ventricular size is mildly enlarged. There is moderately  elevated pulmonary artery systolic pressure. The estimated right ventricular systolic pressure is 09.3 mmHg.  3. Left atrial size was moderately dilated.  4. Right atrial size was moderately dilated.  5. The mitral valve is grossly normal. Trivial mitral valve regurgitation.  6. The aortic valve is tricuspid. Aortic valve regurgitation is not visualized. Aortic valve sclerosis is present, with no evidence of aortic valve stenosis.  7. The inferior vena cava is dilated in size with <50% respiratory variability, suggesting right atrial pressure of 15 mmHg. Comparison(s): Compared to prior TTE on 02/2021, the RV is now mildly dilated and hypokinetic with moderate pulmonary HTN (previously normal) in the setting of acute PE. FINDINGS  Left Ventricle: Left ventricular ejection fraction, by estimation, is 55  to 60%. The left ventricle has normal function. The left ventricle has no regional wall motion abnormalities. The left ventricular internal cavity size was normal in size. There is  no left ventricular hypertrophy. Left ventricular diastolic parameters are consistent with Grade II diastolic dysfunction (pseudonormalization). Right Ventricle: The right ventricular size is mildly enlarged. No increase in right ventricular wall thickness. Right ventricular systolic function is mildly reduced. There is moderately elevated pulmonary artery systolic pressure. The tricuspid regurgitant velocity is 3.02 m/s, and with an assumed right atrial pressure of 15 mmHg, the estimated right ventricular systolic pressure is 81.2 mmHg. Left Atrium: Left atrial size was moderately dilated. Right Atrium: Right atrial size was moderately dilated. Pericardium: Trivial pericardial effusion is present. The pericardial effusion is posterior to the left ventricle. Mitral Valve: The mitral valve is grossly normal. There is mild thickening of the mitral valve leaflet(s). There is mild calcification of the mitral valve leaflet(s). Trivial mitral  valve regurgitation. MV peak gradient, 4.3 mmHg. The mean mitral valve gradient is 2.0 mmHg. Tricuspid Valve: The tricuspid valve is normal in structure. Tricuspid valve regurgitation is mild. Aortic Valve: The aortic valve is tricuspid. Aortic valve regurgitation is not visualized. Aortic valve sclerosis is present, with no evidence of aortic valve stenosis. Aortic valve mean gradient measures 3.0 mmHg. Aortic valve peak gradient measures 6.3  mmHg. Aortic valve area, by VTI measures 3.12 cm. Pulmonic Valve: The pulmonic valve was normal in structure. Pulmonic valve regurgitation is trivial. Aorta: The aortic root is normal in size and structure. Venous: The inferior vena cava is dilated in size with less than 50% respiratory variability, suggesting right atrial pressure of 15 mmHg. IAS/Shunts: There is left bowing of the interatrial septum, suggestive of elevated right atrial pressure. The atrial septum is grossly normal.  LEFT VENTRICLE PLAX 2D LVIDd:         4.50 cm   Diastology LVIDs:         3.20 cm   LV e' medial:    6.22 cm/s LV PW:         1.10 cm   LV E/e' medial:  14.3 LV IVS:        0.90 cm   LV e' lateral:   8.32 cm/s LVOT diam:     2.00 cm   LV E/e' lateral: 10.7 LV SV:         87 LV SV Index:   49 LVOT Area:     3.14 cm  RIGHT VENTRICLE RV Basal diam:  3.95 cm RV Mid diam:    3.60 cm RV S prime:     12.40 cm/s TAPSE (M-mode): 2.7 cm LEFT ATRIUM             Index        RIGHT ATRIUM           Index LA diam:        4.30 cm 2.45 cm/m   RA Area:     24.00 cm LA Vol (A2C):   76.9 ml 43.74 ml/m  RA Volume:   79.90 ml  45.45 ml/m LA Vol (A4C):   78.8 ml 44.83 ml/m LA Biplane Vol: 80.1 ml 45.57 ml/m  AORTIC VALVE                    PULMONIC VALVE AV Area (Vmax):    2.85 cm     PV Vmax:       0.86 m/s AV Area (Vmean):   2.83  cm     PV Peak grad:  3.0 mmHg AV Area (VTI):     3.12 cm AV Vmax:           125.50 cm/s AV Vmean:          83.950 cm/s AV VTI:            0.278 m AV Peak Grad:      6.3 mmHg AV  Mean Grad:      3.0 mmHg LVOT Vmax:         114.00 cm/s LVOT Vmean:        75.700 cm/s LVOT VTI:          0.276 m LVOT/AV VTI ratio: 0.99  AORTA Ao Root diam: 3.60 cm MITRAL VALVE               TRICUSPID VALVE MV Area (PHT): 4.31 cm    TR Peak grad:   36.5 mmHg MV Area VTI:   2.73 cm    TR Vmax:        302.00 cm/s MV Peak grad:  4.3 mmHg MV Mean grad:  2.0 mmHg    SHUNTS MV Vmax:       1.04 m/s    Systemic VTI:  0.28 m MV Vmean:      59.3 cm/s   Systemic Diam: 2.00 cm MV Decel Time: 176 msec MV E velocity: 89.10 cm/s MV A velocity: 81.80 cm/s MV E/A ratio:  1.09 Gwyndolyn Kaufman MD Electronically signed by Gwyndolyn Kaufman MD Signature Date/Time: 08/16/2021/12:25:32 PM    Final    US Venous Img Lower Bilateral (DVT)  Result Date: 08/16/2021 CLINICAL DATA:  Admitted with pulmonary embolism.  Evaluate for DVT. EXAM: BILATERAL LOWER EXTREMITY VENOUS DOPPLER ULTRASOUND TECHNIQUE: Gray-scale sonography with graded compression, as well as color Doppler and duplex ultrasound were performed to evaluate the lower extremity deep venous systems from the level of the common femoral vein and including the common femoral, femoral, profunda femoral, popliteal and calf veins including the posterior tibial, peroneal and gastrocnemius veins when visible. The superficial great saphenous vein was also interrogated. Spectral Doppler was utilized to evaluate flow at rest and with distal augmentation maneuvers in the common femoral, femoral and popliteal veins. COMPARISON:  None Available. FINDINGS: RIGHT LOWER EXTREMITY Common Femoral Vein: No evidence of thrombus. Normal compressibility, respiratory phasicity and response to augmentation. Saphenofemoral Junction: No evidence of thrombus. Normal compressibility and flow on color Doppler imaging. Profunda Femoral Vein: No evidence of thrombus. Normal compressibility and flow on color Doppler imaging. Femoral Vein: No evidence of thrombus. Normal compressibility, respiratory phasicity  and response to augmentation. Popliteal Vein: No evidence of thrombus. Normal compressibility, respiratory phasicity and response to augmentation. Calf Veins: No evidence of thrombus. Normal compressibility and flow on color Doppler imaging. Superficial Great Saphenous Vein: No evidence of thrombus. Normal compressibility. Other Findings:  None. LEFT LOWER EXTREMITY Common Femoral Vein: No evidence of thrombus. Normal compressibility, respiratory phasicity and response to augmentation. Saphenofemoral Junction: No evidence of thrombus. Normal compressibility and flow on color Doppler imaging. Profunda Femoral Vein: No evidence of thrombus. Normal compressibility and flow on color Doppler imaging. Femoral Vein: No evidence of thrombus. Normal compressibility, respiratory phasicity and response to augmentation. Popliteal Vein: No evidence of thrombus. Normal compressibility, respiratory phasicity and response to augmentation. Calf Veins: No evidence of thrombus. Normal compressibility and flow on color Doppler imaging. Superficial Great Saphenous Vein: No evidence of thrombus. Normal compressibility. Other Findings:  None. IMPRESSION: No evidence  of DVT within either lower extremity. Electronically Signed   By: Sandi Mariscal M.D.   On: 08/16/2021 11:02   CT Angio Chest PE W and/or Wo Contrast  Result Date: 08/15/2021 CLINICAL DATA:  Back pain for 2 days, concern for pulmonary embolism. EXAM: CT ANGIOGRAPHY CHEST WITH CONTRAST TECHNIQUE: Multidetector CT imaging of the chest was performed using the standard protocol during bolus administration of intravenous contrast. Multiplanar CT image reconstructions and MIPs were obtained to evaluate the vascular anatomy. RADIATION DOSE REDUCTION: This exam was performed according to the departmental dose-optimization program which includes automated exposure control, adjustment of the mA and/or kV according to patient size and/or use of iterative reconstruction technique.  CONTRAST:  38m OMNIPAQUE IOHEXOL 350 MG/ML SOLN COMPARISON:  CT chest dated 11/02/2020. FINDINGS: Cardiovascular: Satisfactory opacification of the pulmonary arteries to the segmental level. Filling defects are noted in segmental and subsegmental pulmonary arteries supplying the right middle lobe (series 5, image [id number]) and left lower lobe (series 5, image [id number]). The right and left pulmonary arteries are enlarged, each measuring 2.4 cm in size. Vascular calcifications are seen in the coronary arteries and aortic arch. Heart is enlarged. No pericardial effusion. Mediastinum/Nodes: No enlarged mediastinal, hilar, or axillary lymph nodes. Thyroid gland and esophagus demonstrate no significant findings. Lungs/Pleura: Aspirated debris is seen in the trachea. Bilateral lower lung predominant bronchiectasis with associated mild ground-glass opacities likely reflect chronic aspiration. There is mild bilateral lower lobe atelectasis. No pleural effusion or pneumothorax. Upper Abdomen: No acute abnormality. Musculoskeletal: Degenerative changes are seen in the spine. Review of the MIP images confirms the above findings. IMPRESSION: 1. Filling defects in segmental and subsegmental pulmonary artery supplying the right middle lobe and left lower lobe are most consistent with acute pulmonary emboli. 2. Aspirated debris in the trachea as well as bilateral lower lung predominant bronchiectasis and mild ground-glass opacities likely reflect chronic aspiration possible superimposed pneumonia. 3. Enlarged right and left pulmonary arteries, suggestive of pulmonary hypertension. Aortic Atherosclerosis (ICD10-I70.0). Electronically Signed   By: TZerita BoersM.D.   On: 08/15/2021 17:25   DG Chest Port 1 View  Result Date: 08/15/2021 CLINICAL DATA:  Shortness of breath EXAM: PORTABLE CHEST 1 VIEW COMPARISON:  03/13/2021 FINDINGS: Gross cardiomegaly. Mild, diffuse bilateral interstitial pulmonary opacity. The visualized skeletal  structures are unremarkable. IMPRESSION: Gross cardiomegaly with mild, diffuse bilateral interstitial pulmonary opacity, likely edema. No focal airspace opacity. Electronically Signed   By: ADelanna AhmadiM.D.   On: 08/15/2021 15:16    Microbiology: Results for orders placed or performed during the hospital encounter of 08/15/21  Resp Panel by RT-PCR (Flu A&B, Covid) Anterior Nasal Swab     Status: None   Collection Time: 08/15/21  2:38 PM   Specimen: Anterior Nasal Swab  Result Value Ref Range Status   SARS Coronavirus 2 by RT PCR NEGATIVE NEGATIVE Final    Comment: (NOTE) SARS-CoV-2 target nucleic acids are NOT DETECTED.  The SARS-CoV-2 RNA is generally detectable in upper respiratory specimens during the acute phase of infection. The lowest concentration of SARS-CoV-2 viral copies this assay can detect is 138 copies/mL. A negative result does not preclude SARS-Cov-2 infection and should not be used as the sole basis for treatment or other patient management decisions. A negative result may occur with  improper specimen collection/handling, submission of specimen other than nasopharyngeal swab, presence of viral mutation(s) within the areas targeted by this assay, and inadequate number of viral copies(<138 copies/mL). A negative result must be combined with clinical  observations, patient history, and epidemiological information. The expected result is Negative.  Fact Sheet for Patients:  EntrepreneurPulse.com.au  Fact Sheet for Healthcare Providers:  IncredibleEmployment.be  This test is no t yet approved or cleared by the [place] FDA and  has been authorized for detection and/or diagnosis of SARS-CoV-2 by FDA under an Emergency Use Authorization (EUA). This EUA will remain  in effect (meaning this test can be used) for the duration of the COVID-19 declaration under Section 564(b)(1) of the Act, 21 U.S.C.section 360bbb-3(b)(1), unless the  authorization is terminated  or revoked sooner.       Influenza A by PCR NEGATIVE NEGATIVE Final   Influenza B by PCR NEGATIVE NEGATIVE Final    Comment: (NOTE) The Xpert Xpress SARS-CoV-2/FLU/RSV plus assay is intended as an aid in the diagnosis of influenza from Nasopharyngeal swab specimens and should not be used as a sole basis for treatment. Nasal washings and aspirates are unacceptable for Xpert Xpress SARS-CoV-2/FLU/RSV testing.  Fact Sheet for Patients: EntrepreneurPulse.com.au  Fact Sheet for Healthcare Providers: IncredibleEmployment.be  This test is not yet approved or cleared by the [place] FDA and has been authorized for detection and/or diagnosis of SARS-CoV-2 by FDA under an Emergency Use Authorization (EUA). This EUA will remain in effect (meaning this test can be used) for the duration of the COVID-19 declaration under Section 564(b)(1) of the Act, 21 U.S.C. section 360bbb-3(b)(1), unless the authorization is terminated or revoked.  Performed at [facility], [address] [place], [place] [address]     Labs: CBC: Recent Labs  Lab 08/15/21 1438 08/16/21 0103  WBC 5.5 5.4  HGB 11.4* 10.6*  HCT 39.6 36.7  MCV 84.3 85.2  PLT 224 835   Basic Metabolic Panel: Recent Labs  Lab 08/15/21 1438 08/16/21 0103  NA 142 142  K 4.2 4.0  CL 109 111  CO2 26 25  GLUCOSE 92 88  BUN 19 18  CREATININE 0.94 0.81  CALCIUM 8.5* 8.3*   Liver Function Tests: Recent Labs  Lab 08/15/21 1438  AST 13*  ALT 10  ALKPHOS 73  BILITOT 0.6  PROT 7.3  ALBUMIN 3.7   CBG: No results for input(s): "GLUCAP" in the last 168 hours.  Discharge time spent: greater than 30 minutes.  Signed: Barton Dubois, MD [facility] 08/16/2021

## 2021-08-16 NOTE — Assessment & Plan Note (Signed)
-  Continue modified carbohydrate diet -Close monitoring of patient's CBGs recommended.

## 2021-08-16 NOTE — Assessment & Plan Note (Signed)
-  Continue calcium supplementation.

## 2021-08-16 NOTE — Assessment & Plan Note (Signed)
-  Continue chronic oxygen supplementation -Education and instructions to be compliant with oxygen supplementation provided.

## 2021-08-17 ENCOUNTER — Telehealth: Payer: Self-pay

## 2021-08-17 NOTE — Telephone Encounter (Signed)
Transition Care Management Unsuccessful Follow-up Telephone Call  Date of discharge and from where:  [facility] 08/16/2021  Attempts:  1st Attempt  Reason for unsuccessful TCM follow-up call:  Left voice message

## 2021-08-21 ENCOUNTER — Emergency Department (HOSPITAL_COMMUNITY)
Admission: EM | Admit: 2021-08-21 | Discharge: 2021-08-21 | Disposition: A | Discharge status: Home / Self Care | Payer: Medicare Other | Attending: Emergency Medicine | Admitting: Emergency Medicine

## 2021-08-21 ENCOUNTER — Other Ambulatory Visit: Payer: Self-pay

## 2021-08-21 ENCOUNTER — Emergency Department (HOSPITAL_COMMUNITY): Payer: Medicare Other

## 2021-08-21 ENCOUNTER — Encounter (HOSPITAL_COMMUNITY): Payer: Self-pay | Admitting: Emergency Medicine

## 2021-08-21 DIAGNOSIS — I110 Hypertensive heart disease with heart failure: Secondary | ICD-10-CM | POA: Diagnosis not present

## 2021-08-21 DIAGNOSIS — M79672 Pain in left foot: Secondary | ICD-10-CM | POA: Diagnosis not present

## 2021-08-21 DIAGNOSIS — F17210 Nicotine dependence, cigarettes, uncomplicated: Secondary | ICD-10-CM | POA: Diagnosis not present

## 2021-08-21 DIAGNOSIS — W010XXA Fall on same level from slipping, tripping and stumbling without subsequent striking against object, initial encounter: Secondary | ICD-10-CM | POA: Diagnosis not present

## 2021-08-21 DIAGNOSIS — I509 Heart failure, unspecified: Secondary | ICD-10-CM | POA: Diagnosis not present

## 2021-08-21 DIAGNOSIS — Z7901 Long term (current) use of anticoagulants: Secondary | ICD-10-CM | POA: Diagnosis not present

## 2021-08-21 DIAGNOSIS — Z043 Encounter for examination and observation following other accident: Secondary | ICD-10-CM | POA: Diagnosis not present

## 2021-08-21 DIAGNOSIS — M79671 Pain in right foot: Secondary | ICD-10-CM | POA: Diagnosis not present

## 2021-08-21 DIAGNOSIS — R519 Headache, unspecified: Secondary | ICD-10-CM | POA: Diagnosis not present

## 2021-08-21 DIAGNOSIS — M7989 Other specified soft tissue disorders: Secondary | ICD-10-CM | POA: Diagnosis not present

## 2021-08-21 DIAGNOSIS — J320 Chronic maxillary sinusitis: Secondary | ICD-10-CM | POA: Diagnosis not present

## 2021-08-21 DIAGNOSIS — J449 Chronic obstructive pulmonary disease, unspecified: Secondary | ICD-10-CM | POA: Diagnosis not present

## 2021-08-21 DIAGNOSIS — W19XXXA Unspecified fall, initial encounter: Secondary | ICD-10-CM

## 2021-08-21 MED ORDER — ACETAMINOPHEN 500 MG PO TABS
1000.0000 mg | ORAL_TABLET | Freq: Once | ORAL | Status: AC
  Administered 2021-08-21: 1000 mg via ORAL
  Filled 2021-08-21: qty 2

## 2021-08-21 NOTE — ED Triage Notes (Signed)
Pt states she fell at home. Pt states she is not sure if she hit her head, but c/o bilateral foot pain.

## 2021-08-21 NOTE — Discharge Instructions (Signed)
You were evaluated in the Emergency Department and after careful evaluation, we did not find any emergent condition requiring admission or further testing in the hospital.  Your exam/testing today was overall reassuring.  X-rays did not show any broken bones.  Suspect feet are bruised or sprained.  Recommend Tylenol at home for discomfort.  Please return to the Emergency Department if you experience any worsening of your condition.  Thank you for allowing [place] to be a part of your care.

## 2021-08-21 NOTE — ED Provider Notes (Signed)
[facility] Emergency Department Provider Note MRN:  [id number]  Arrival date & time: 08/21/21     Chief Complaint   Fall   History of Present Illness   Marisa Bailey is a 81 y.o. year-old female with a history of CHF, COPD, hypertension presenting to the ED with chief complaint of fall.  Patient tripped and fell.  Endorsing bilateral foot pain.  Unsure if she hit her head.  Recent blood clot, recently started on Eliquis.  Denies chest pain or shortness of breath, no neck or back pain, no abdominal pain.  Review of Systems  A thorough review of systems was obtained and all systems are negative except as noted in the HPI and PMH.   Patient's Health History    Past Medical History:  Diagnosis Date   Aspiration pneumonia ([place]) 11/03/2020   Blind right eye    CHF (congestive heart failure) (HCC)    COPD (chronic obstructive pulmonary disease) (HCC)    Fx upper humerus-closed 08/17/2012   Hypertension     Past Surgical History:  Procedure Laterality Date   ENUCLEATION      Family History  Problem Relation Age of Onset   Diabetes Mother     Social History   Socioeconomic History   Marital status: Single    Spouse name: Not on file   Number of children: Not on file   Years of education: Not on file   Highest education level: Not on file  Occupational History   Not on file  Tobacco Use   Smoking status: Every Day    Packs/day: 1.00    Years: 18.00    Total pack years: 18.00    Types: Cigarettes   Smokeless tobacco: Never  Vaping Use   Vaping Use: Never used  Substance and Sexual Activity   Alcohol use: Not Currently    Comment: rarely   Drug use: No   Sexual activity: Not on file  Other Topics Concern   Not on file  Social History Narrative   Retired (from [employer]). No children, never been married. Has 8 siblings (5 are deceased).   Social Determinants of Health   Financial Resource Strain: Low Risk  (11/30/2020)   Overall Financial  Resource Strain (CARDIA)    Difficulty of Paying Living Expenses: Not hard at all  Food Insecurity: No Food Insecurity (11/30/2020)   Hunger Vital Sign    Worried About Running Out of Food in the Last Year: Never true    Ran Out of Food in the Last Year: Never true  Transportation Needs: No Transportation Needs (11/30/2020)   PRAPARE - Hydrologist (Medical): No    Lack of Transportation (Non-Medical): No  Physical Activity: Sufficiently Active (11/30/2020)   Exercise Vital Sign    Days of Exercise per Week: 7 days    Minutes of Exercise per Session: 30 min  Stress: No Stress Concern Present (11/30/2020)   [place]    Feeling of Stress : Not at all  Social Connections: Moderately Isolated (11/30/2020)   Social Connection and Isolation Panel [NHANES]    Frequency of Communication with Friends and Family: More than three times a week    Frequency of Social Gatherings with Friends and Family: More than three times a week    Attends Religious Services: 1 to 4 times per year    Active Member of Clubs or Organizations: No    Attends  Club or Organization Meetings: Never    Marital Status: Never married  Intimate Partner Violence: Not At Risk (11/30/2020)   Humiliation, Afraid, Rape, and Kick questionnaire    Fear of Current or Ex-Partner: No    Emotionally Abused: No    Physically Abused: No    Sexually Abused: No     Physical Exam   Vitals:   08/21/21 0645 08/21/21 0649  BP: (!) 162/127 (!) 162/99  Pulse: 76 76  Resp:  19  Temp:  97.7 F (36.5 C)  SpO2:  96%    CONSTITUTIONAL: Well-appearing, NAD NEURO/PSYCH:  Alert and oriented x 3, no focal deficits EYES:  eyes equal and reactive ENT/NECK:  no LAD, no JVD CARDIO: Regular rate, well-perfused, normal S1 and S2 PULM:  CTAB no wheezing or rhonchi GI/GU:  non-distended, non-tender MSK/SPINE:  No gross deformities, no edema,  tenderness palpation to bilateral feet SKIN:  no rash, atraumatic   *Additional and/or pertinent findings included in MDM below  Diagnostic and Interventional Summary    EKG Interpretation  Date/Time:    Ventricular Rate:    PR Interval:    QRS Duration:   QT Interval:    QTC Calculation:   R Axis:     Text Interpretation:         Labs Reviewed - No data to display  CT HEAD WO CONTRAST (5MM)  Final Result    DG Foot Complete Right  Final Result    DG Foot Complete Left  Final Result      Medications  acetaminophen (TYLENOL) tablet 1,000 mg (1,000 mg Oral Given 08/21/21 0320)     Procedures  /  Critical Care Procedures  ED Course and Medical Decision Making  Initial Impression and Ddx X-rays to evaluate for foot fracture, CT head to exclude bleeding given the anticoagulation.  Past medical/surgical history that increases complexity of ED encounter: None  Interpretation of Diagnostics I personally reviewed the foot x-ray and my interpretation is as follows: No obvious fractures  The CT is without intracranial bleeding.  Patient Reassessment and Ultimate Disposition/Management     Patient feeling well on reassessment, appropriate for discharge.  Patient management required discussion with the following services or consulting groups:  None  Complexity of Problems Addressed Acute illness or injury that poses threat of life of bodily function  Additional Data Reviewed and Analyzed Further history obtained from: Further history from spouse/family member  Additional Factors Impacting ED Encounter Risk None  Barth Kirks. [place] Small, [place] [email or web address]  Final Clinical Impressions(s) / ED Diagnoses     ICD-10-CM   1. Fall, initial encounter  W19.[facility]       ED Discharge Orders     None        Discharge Instructions Discussed with and Provided to Patient:     Discharge Instructions       You were evaluated in the Emergency Department and after careful evaluation, we did not find any emergent condition requiring admission or further testing in the hospital.  Your exam/testing today was overall reassuring.  X-rays did not show any broken bones.  Suspect feet are bruised or sprained.  Recommend Tylenol at home for discomfort.  Please return to the Emergency Department if you experience any worsening of your condition.  Thank you for allowing [place] to be a part of your care.        Maudie Flakes, MD 08/21/21 [phone]

## 2021-08-26 DIAGNOSIS — J449 Chronic obstructive pulmonary disease, unspecified: Secondary | ICD-10-CM | POA: Diagnosis not present

## 2021-08-30 ENCOUNTER — Telehealth: Payer: Self-pay | Admitting: Internal Medicine

## 2021-08-30 NOTE — Telephone Encounter (Signed)
PCS forms  Noted  Copied  Sleeved

## 2021-09-06 ENCOUNTER — Other Ambulatory Visit: Payer: Self-pay | Admitting: Internal Medicine

## 2021-09-06 DIAGNOSIS — I10 Essential (primary) hypertension: Secondary | ICD-10-CM

## 2021-09-11 ENCOUNTER — Encounter: Payer: Self-pay | Admitting: Internal Medicine

## 2021-09-11 ENCOUNTER — Ambulatory Visit (INDEPENDENT_AMBULATORY_CARE_PROVIDER_SITE_OTHER): Payer: Medicare Other | Admitting: Internal Medicine

## 2021-09-11 VITALS — BP 118/82 | HR 57 | Resp 18 | Ht 64.0 in | Wt 146.8 lb

## 2021-09-11 DIAGNOSIS — J9611 Chronic respiratory failure with hypoxia: Secondary | ICD-10-CM

## 2021-09-11 DIAGNOSIS — E559 Vitamin D deficiency, unspecified: Secondary | ICD-10-CM

## 2021-09-11 DIAGNOSIS — R7303 Prediabetes: Secondary | ICD-10-CM | POA: Diagnosis not present

## 2021-09-11 DIAGNOSIS — I5032 Chronic diastolic (congestive) heart failure: Secondary | ICD-10-CM

## 2021-09-11 DIAGNOSIS — Z23 Encounter for immunization: Secondary | ICD-10-CM | POA: Diagnosis not present

## 2021-09-11 DIAGNOSIS — Z0001 Encounter for general adult medical examination with abnormal findings: Secondary | ICD-10-CM | POA: Diagnosis not present

## 2021-09-11 DIAGNOSIS — E782 Mixed hyperlipidemia: Secondary | ICD-10-CM | POA: Diagnosis not present

## 2021-09-11 DIAGNOSIS — J439 Emphysema, unspecified: Secondary | ICD-10-CM | POA: Diagnosis not present

## 2021-09-11 DIAGNOSIS — I2694 Multiple subsegmental pulmonary emboli without acute cor pulmonale: Secondary | ICD-10-CM

## 2021-09-11 MED ORDER — RIVAROXABAN 20 MG PO TABS: 20.0000 mg | ORAL_TABLET | Freq: Every day | ORAL | 5 refills | Status: DC

## 2021-09-11 MED ORDER — METFORMIN HCL 500 MG PO TABS: 500.0000 mg | ORAL_TABLET | Freq: Two times a day (BID) | ORAL | 1 refills | Status: DC

## 2021-09-11 NOTE — Assessment & Plan Note (Signed)
Takes metformin 500 mg twice daily

## 2021-09-11 NOTE — Assessment & Plan Note (Signed)
On atorvastatin 20 mg daily 

## 2021-09-11 NOTE — Assessment & Plan Note (Signed)
Well-controlled with Dulera and albuterol as needed °Followed by pulmonology °

## 2021-09-11 NOTE — Assessment & Plan Note (Addendum)
Appears euvolemic currently Not on any diuretic On olmesartan

## 2021-09-11 NOTE — Assessment & Plan Note (Signed)

## 2021-09-11 NOTE — Progress Notes (Signed)
Established Patient Office Visit  Subjective:  Patient ID: Marisa Bailey, female    DOB: 1941-01-19  Age: 81 y.o. MRN: [id number]  CC:  Chief Complaint  Patient presents with   Annual Exam    Annual exam     HPI Marisa Bailey is a 81 y.o. female with past medical history of HTN, HFpEF, COPD with chronic hypoxic respiratory failure and tobacco abuse who presents for annual physical.  She was admitted in the hospital for PE about 3 weeks ago, and was discharged on Eliquis.  She has stopped taking it as it was causing widespread rash and bruising.  She denies any acute worsening of dyspnea today.  Of note, she has history of chronic hypoxic respiratory failure due to COPD and has home O2 at 2 lpm.  She requests a portable oxygen machine for easy use while ambulating and going out.  She denies any chest pain or palpitations.  Resting O2 sat: 92% On ambulation: 86% On ambulation with O2: 91%   Past Medical History:  Diagnosis Date   Aspiration pneumonia ([place]) 11/03/2020   Blind right eye    CHF (congestive heart failure) (HCC)    COPD (chronic obstructive pulmonary disease) (HCC)    Fx upper humerus-closed 08/17/2012   Hypertension     Past Surgical History:  Procedure Laterality Date   ENUCLEATION      Family History  Problem Relation Age of Onset   Diabetes Mother     Social History   Socioeconomic History   Marital status: Single    Spouse name: Not on file   Number of children: Not on file   Years of education: Not on file   Highest education level: Not on file  Occupational History   Not on file  Tobacco Use   Smoking status: Every Day    Packs/day: 1.00    Years: 18.00    Total pack years: 18.00    Types: Cigarettes   Smokeless tobacco: Never  Vaping Use   Vaping Use: Never used  Substance and Sexual Activity   Alcohol use: Not Currently    Comment: rarely   Drug use: No   Sexual activity: Not on file  Other Topics Concern   Not on file  Social  History Narrative   Retired (from [employer]). No children, never been married. Has 8 siblings (5 are deceased).   Social Determinants of Health   Financial Resource Strain: Low Risk  (11/30/2020)   Overall Financial Resource Strain (CARDIA)    Difficulty of Paying Living Expenses: Not hard at all  Food Insecurity: No Food Insecurity (11/30/2020)   Hunger Vital Sign    Worried About Running Out of Food in the Last Year: Never true    Ran Out of Food in the Last Year: Never true  Transportation Needs: No Transportation Needs (11/30/2020)   PRAPARE - Hydrologist (Medical): No    Lack of Transportation (Non-Medical): No  Physical Activity: Sufficiently Active (11/30/2020)   Exercise Vital Sign    Days of Exercise per Week: 7 days    Minutes of Exercise per Session: 30 min  Stress: No Stress Concern Present (11/30/2020)   [place]    Feeling of Stress : Not at all  Social Connections: Moderately Isolated (11/30/2020)   Social Connection and Isolation Panel [NHANES]    Frequency of Communication with Friends and Family: More than three times a  week    Frequency of Social Gatherings with Friends and Family: More than three times a week    Attends Religious Services: 1 to 4 times per year    Active Member of [employer] or Organizations: No    Attends Archivist Meetings: Never    Marital Status: Never married  Intimate Partner Violence: Not At Risk (11/30/2020)   Humiliation, Afraid, Rape, and Kick questionnaire    Fear of Current or Ex-Partner: No    Emotionally Abused: No    Physically Abused: No    Sexually Abused: No    Outpatient Medications Prior to Visit  Medication Sig Dispense Refill   albuterol (PROVENTIL) (2.5 MG/3ML) 0.083% nebulizer solution Take 3 mLs (2.5 mg total) by nebulization every 6 (six) hours as needed for wheezing or shortness of breath. 300 mL 1   albuterol  (VENTOLIN HFA) 108 (90 Base) MCG/ACT inhaler Inhale 2 puffs into the lungs every 6 (six) hours as needed for wheezing or shortness of breath. 8 g 2   amLODipine (NORVASC) 10 MG tablet Take 1 tablet by mouth once daily 90 tablet 0   atorvastatin (LIPITOR) 20 MG tablet TAKE 1 TABLET BY MOUTH DAILY 90 tablet 3   Cholecalciferol (VITAMIN D3) 25 MCG (1000 UT) CAPS Take 2 capsules by mouth daily.     folic acid (FOLVITE) 1 MG tablet Take 1 tablet (1 mg total) by mouth daily. 90 tablet 0   HYDROcodone-acetaminophen (NORCO/VICODIN) 5-325 MG tablet Take 1 tablet by mouth every 6 (six) hours as needed for severe pain. 30 tablet 0   ILEVRO 0.3 % ophthalmic suspension 1 drop daily.     moxifloxacin (VIGAMOX) 0.5 % ophthalmic solution Apply 1 drop to eye 3 (three) times daily.     nicotine (NICODERM CQ - DOSED IN MG/24 HR) 7 mg/24hr patch Place 1 patch (7 mg total) onto the skin daily. 28 patch 1   olmesartan (BENICAR) 40 MG tablet TAKE 1 TABLET BY MOUTH DAILY 90 tablet 3   pantoprazole (PROTONIX) 40 MG tablet Take 1 tablet (40 mg total) by mouth daily. 30 tablet 1   prednisoLONE acetate (PRED FORTE) 1 % ophthalmic suspension 1 drop 3 (three) times daily.     apixaban (ELIQUIS) 5 MG TABS tablet Take 2 tablets by mouth twice a day 7 days; then 1 tablet by mouth twice a day. 60 tablet 4   meloxicam (MOBIC) 7.5 MG tablet Take 1 tablet by mouth once daily 30 tablet 0   metFORMIN (GLUCOPHAGE) 500 MG tablet Take 1 tablet (500 mg total) by mouth 2 (two) times daily. 180 tablet 0   No facility-administered medications prior to visit.    Allergies  Allergen Reactions   Penicillins Rash    Has patient had a PCN reaction causing immediate rash, facial/tongue/throat swelling, SOB or lightheadedness with hypotension: {Yes/No:[id number] Has patient had a PCN reaction causing severe rash involving mucus membranes or skin necrosis: Yes Has patient had a PCN reaction that required hospitalization No Has patient had a PCN  reaction occurring within the last 10 years: No If all of the above answers are "NO", then may proceed with Cephalosporin use.  **Has tolerated keflex     ROS Review of Systems  Constitutional:  Negative for chills and fever.  HENT:  Negative for congestion, sinus pressure and sore throat.   Eyes:  Negative for pain and discharge.       Right eye blindness  Respiratory:  Positive for cough and shortness of  breath.   Cardiovascular:  Negative for chest pain and palpitations.  Gastrointestinal:  Negative for abdominal pain, diarrhea, nausea and vomiting.  Endocrine: Negative for polydipsia and polyuria.  Genitourinary:  Negative for dysuria and hematuria.  Musculoskeletal:  Negative for neck pain and neck stiffness.  Skin:  Negative for rash.  Neurological:  Negative for dizziness and weakness.  Psychiatric/Behavioral:  Negative for agitation and behavioral problems.       Objective:    Physical Exam Vitals reviewed.  Constitutional:      General: She is not in acute distress.    Appearance: She is not diaphoretic.  HENT:     Head: Normocephalic and atraumatic.     Mouth/Throat:     Mouth: Mucous membranes are moist.  Eyes:     General: No scleral icterus.    Comments: Right eye blindness  Cardiovascular:     Rate and Rhythm: Normal rate and regular rhythm.     Pulses: Normal pulses.     Heart sounds: Normal heart sounds. No murmur heard. Pulmonary:     Breath sounds: Normal breath sounds. No wheezing or rales.  Abdominal:     Palpations: Abdomen is soft.     Tenderness: There is no abdominal tenderness.  Musculoskeletal:     Cervical back: Neck supple. No tenderness.     Right lower leg: No edema.     Left lower leg: No edema.  Skin:    General: Skin is warm.     Findings: No rash.  Neurological:     General: No focal deficit present.     Mental Status: She is alert and oriented to person, place, and time.     Sensory: No sensory deficit.     Motor: No  weakness.  Psychiatric:        Mood and Affect: Mood normal.        Behavior: Behavior normal.     BP 118/82 (BP Location: Right Arm, Patient Position: Sitting, Cuff Size: Normal)   Pulse (!) 57   Resp 18   Ht _0  (1.626 m)   Wt 146 lb 12.8 oz (66.6 kg)   SpO2 90%   BMI 25.20 kg/m  Wt Readings from Last 3 Encounters:  09/11/21 146 lb 12.8 oz (66.6 kg)  08/21/21 156 lb (70.8 kg)  08/15/21 155 lb 8 oz (70.5 kg)    Lab Results  Component Value Date   TSH 0.344 (L) 03/14/2021   Lab Results  Component Value Date   WBC 5.4 08/16/2021   HGB 10.6 (L) 08/16/2021   HCT 36.7 08/16/2021   MCV 85.2 08/16/2021   PLT 213 08/16/2021   Lab Results  Component Value Date   NA 142 08/16/2021   K 4.0 08/16/2021   CO2 25 08/16/2021   GLUCOSE 88 08/16/2021   BUN 18 08/16/2021   CREATININE 0.81 08/16/2021   BILITOT 0.6 08/15/2021   ALKPHOS 73 08/15/2021   AST 13 (L) 08/15/2021   ALT 10 08/15/2021   PROT 7.3 08/15/2021   ALBUMIN 3.7 08/15/2021   CALCIUM 8.3 (L) 08/16/2021   ANIONGAP 6 08/16/2021   EGFR 69 09/07/2020   Lab Results  Component Value Date   CHOL 126 09/07/2020   Lab Results  Component Value Date   HDL 46 (L) 09/07/2020   Lab Results  Component Value Date   LDLCALC 58 09/07/2020   Lab Results  Component Value Date   TRIG 138 09/07/2020   Lab Results  Component Value  Date   CHOLHDL 2.7 09/07/2020   Lab Results  Component Value Date   HGBA1C 5.5 09/07/2020      Assessment & Plan:   Problem List Items Addressed This Visit       Cardiovascular and Mediastinum   (HFpEF) heart failure with preserved ejection fraction ([place])    Appears euvolemic currently Not on any diuretic On olmesartan      Relevant Medications   rivaroxaban (XARELTO) 20 MG TABS tablet   Other Relevant Orders   TSH   CMP14+EGFR   CBC with Differential/Platelet   Pulmonary emboli ([place])    Was recently admitted for PE Stopped taking Eliquis due to rash Switched to  Xarelto 20 mg QD Emphasized importance of AC      Relevant Medications   rivaroxaban (XARELTO) 20 MG TABS tablet   Other Relevant Orders   CMP14+EGFR   CBC with Differential/Platelet     Respiratory   COPD (chronic obstructive pulmonary disease) ([place])    Well-controlled with Dulera and albuterol as needed Followed by pulmonology      Relevant Orders   For home use only DME oxygen   CMP14+EGFR   CBC with Differential/Platelet   Chronic respiratory failure with hypoxia ([place])    Due to COPD Has home O2 -uses a at nighttime and as needed for dyspnea Prescribed portable O2 for easier use      Relevant Orders   For home use only DME oxygen     Other   Prediabetes    Takes metformin 500 mg twice daily      Relevant Orders   Hemoglobin A1c   HLD (hyperlipidemia)    On atorvastatin 20 mg daily      Relevant Medications   rivaroxaban (XARELTO) 20 MG TABS tablet   Other Relevant Orders   Lipid panel   Encounter for general adult medical examination with abnormal findings - Primary    Physical exam as documented. Counseling done  re healthy lifestyle involving commitment to 150 minutes exercise per week, heart healthy diet, and attaining healthy weight.The importance of adequate sleep also discussed. Changes in health habits are decided on by the patient with goals and time frames  set for achieving them. Immunization and cancer screening needs are specifically addressed at this visit.      Relevant Orders   TSH   CMP14+EGFR   CBC with Differential/Platelet   Other Visit Diagnoses     Vitamin D deficiency       Relevant Orders   VITAMIN D 25 Hydroxy (Vit-D Deficiency, Fractures)   Type 2 diabetes mellitus without complication, without long-term current use of insulin (HCC)       Relevant Medications   metFORMIN (GLUCOPHAGE) 500 MG tablet   Need for pneumococcal 20-valent conjugate vaccination       Relevant Orders   Pneumococcal conjugate vaccine 20-valent  (Prevnar 20) (Completed)       Meds ordered this encounter  Medications   rivaroxaban (XARELTO) 20 MG TABS tablet    Sig: Take 1 tablet (20 mg total) by mouth daily with supper.    Dispense:  30 tablet    Refill:  5   metFORMIN (GLUCOPHAGE) 500 MG tablet    Sig: Take 1 tablet (500 mg total) by mouth 2 (two) times daily.    Dispense:  180 tablet    Refill:  1    Follow-up: Return in about 3 months (around 12/12/2021) for PE and COPD.    Rally Ouch K  Posey Pronto, MD

## 2021-09-11 NOTE — Patient Instructions (Signed)
Please start taking Xarelto instead of Eliquis.  Please stop taking Meloxicam for now.  Please continue to take other medications as prescribed.

## 2021-09-11 NOTE — Assessment & Plan Note (Signed)
Due to COPD Has home O2 -uses a at nighttime and as needed for dyspnea Prescribed portable O2 for easier use

## 2021-09-11 NOTE — Assessment & Plan Note (Signed)
Was recently admitted for PE Stopped taking Eliquis due to rash Switched to Xarelto 20 mg QD Emphasized importance of AC

## 2021-09-12 LAB — HEMOGLOBIN A1C
Est. average glucose Bld gHb Est-mCnc: 123 mg/dL
Hgb A1c MFr Bld: 5.9 % — ABNORMAL HIGH (ref 4.8–5.6)

## 2021-09-12 LAB — CBC WITH DIFFERENTIAL/PLATELET
Basophils Absolute: 0 10*3/uL (ref 0.0–0.2)
Basos: 0 %
EOS (ABSOLUTE): 0.5 10*3/uL — ABNORMAL HIGH (ref 0.0–0.4)
Eos: 11 %
Hematocrit: 40.5 % (ref 34.0–46.6)
Hemoglobin: 12 g/dL (ref 11.1–15.9)
Immature Grans (Abs): 0 10*3/uL (ref 0.0–0.1)
Immature Granulocytes: 0 %
Lymphocytes Absolute: 0.7 10*3/uL (ref 0.7–3.1)
Lymphs: 14 %
MCH: 23.8 pg — ABNORMAL LOW (ref 26.6–33.0)
MCHC: 29.6 g/dL — ABNORMAL LOW (ref 31.5–35.7)
MCV: 80 fL (ref 79–97)
Monocytes Absolute: 0.4 10*3/uL (ref 0.1–0.9)
Monocytes: 9 %
Neutrophils Absolute: 3.3 10*3/uL (ref 1.4–7.0)
Neutrophils: 66 %
Platelets: 266 10*3/uL (ref 150–450)
RBC: 5.05 x10E6/uL (ref 3.77–5.28)
RDW: 17.2 % — ABNORMAL HIGH (ref 11.7–15.4)
WBC: 4.9 10*3/uL (ref 3.4–10.8)

## 2021-09-12 LAB — CMP14+EGFR
ALT: 8 IU/L (ref 0–32)
AST: 14 IU/L (ref 0–40)
Albumin/Globulin Ratio: 1.5 (ref 1.2–2.2)
Albumin: 4.2 g/dL (ref 3.8–4.8)
Alkaline Phosphatase: 80 IU/L (ref 44–121)
BUN/Creatinine Ratio: 6 — ABNORMAL LOW (ref 12–28)
BUN: 5 mg/dL — ABNORMAL LOW (ref 8–27)
Bilirubin Total: 0.7 mg/dL (ref 0.0–1.2)
CO2: 30 mmol/L — ABNORMAL HIGH (ref 20–29)
Calcium: 9.2 mg/dL (ref 8.7–10.3)
Chloride: 99 mmol/L (ref 96–106)
Creatinine, Ser: 0.8 mg/dL (ref 0.57–1.00)
Globulin, Total: 2.8 g/dL (ref 1.5–4.5)
Glucose: 93 mg/dL (ref 70–99)
Potassium: 3.8 mmol/L (ref 3.5–5.2)
Sodium: 143 mmol/L (ref 134–144)
Total Protein: 7 g/dL (ref 6.0–8.5)
eGFR: 74 mL/min/{1.73_m2} (ref >59)

## 2021-09-12 LAB — LIPID PANEL
Chol/HDL Ratio: 2.5 ratio (ref 0.0–4.4)
Cholesterol, Total: 133 mg/dL (ref 100–199)
HDL: 54 mg/dL (ref >39)
LDL Chol Calc (NIH): 62 mg/dL (ref 0–99)
Triglycerides: 88 mg/dL (ref 0–149)
VLDL Cholesterol Cal: 17 mg/dL (ref 5–40)

## 2021-09-12 LAB — VITAMIN D 25 HYDROXY (VIT D DEFICIENCY, FRACTURES): Vit D, 25-Hydroxy: 50.3 ng/mL (ref 30.0–100.0)

## 2021-09-12 LAB — TSH: TSH: 0.608 u[IU]/mL (ref 0.450–4.500)

## 2021-09-17 ENCOUNTER — Telehealth: Payer: Self-pay | Admitting: Internal Medicine

## 2021-09-17 DIAGNOSIS — H25812 Combined forms of age-related cataract, left eye: Secondary | ICD-10-CM | POA: Diagnosis not present

## 2021-09-17 DIAGNOSIS — J449 Chronic obstructive pulmonary disease, unspecified: Secondary | ICD-10-CM | POA: Diagnosis not present

## 2021-09-17 NOTE — Telephone Encounter (Signed)
Pt called stating she is needing consent from her Dr. In order for the oxygen tank to be removed from her house? Can you please contact pt for details?

## 2021-09-17 NOTE — Telephone Encounter (Signed)
Patient will just need to have oxygen supplier fax the office a consent to sign and we will fax back please let the patient know

## 2021-09-18 ENCOUNTER — Encounter (HOSPITAL_COMMUNITY)
Admission: RE | Admit: 2021-09-18 | Discharge: 2021-09-18 | Disposition: A | Discharge status: Home / Self Care | Payer: Medicare Other | Source: Ambulatory Visit | Attending: Ophthalmology | Admitting: Ophthalmology

## 2021-09-18 ENCOUNTER — Encounter (HOSPITAL_COMMUNITY): Payer: Self-pay

## 2021-09-18 HISTORY — DX: Type 2 diabetes mellitus without complications: E11.9

## 2021-09-18 HISTORY — DX: Personal history of other venous thrombosis and embolism: Z86.718

## 2021-09-18 NOTE — H&P (Signed)
Surgical History & Physical  Patient Name: Marisa Bailey DOB: 1940/11/22  Surgery: Cataract extraction with intraocular lens implant phacoemulsification; Left Eye  Surgeon: Baruch Goldmann MD Surgery Date:  09-24-21 Pre-Op Date:  09-17-21  HPI: A 64 Yr. old female patient The patient is here for a cataract evaluation of the left eye, referred from Dr. Jorja Loa. The patient's vision is blurry. The complaint is associated with light sensitivity. This is negatively affecting the patient's quality of life and the patient is unable to function adequately in life with the current level of vision. HPI was performed by Baruch Goldmann .  Medical History: Sewing machine needle eye injury, OD Prosthetic High Blood Pressure  Review of Systems Negative Allergic/Immunologic Negative Cardiovascular Negative Constitutional Negative Ear, Nose, Mouth & Throat Negative Endocrine Negative Eyes Negative Gastrointestinal Negative Genitourinary Negative Hemotologic/Lymphatic Negative Integumentary Negative Musculoskeletal Negative Neurological Negative Psychiatry Negative Respiratory  Social   Current every day smoker   Medication  Amlodipine, Benicar, Olmesartan, Atorvastatin, Folic acid, Meloxicam, Metformin, Albuterol, Vitamin D3, Aspirin,   Sx/Procedures  None  Drug Allergies  PCN,   History & Physical: Heent: Cataract, left eye NECK: supple without bruits LUNGS: lungs clear to auscultation CV: regular rate and rhythm Abdomen: soft and non-tender Impression & Plan: Assessment: 1.  COMBINED FORMS AGE RELATED CATARACT; Left Eye (H25.812) 2.  BLEPHARITIS; Left Upper Lid, Left Lower Lid, Right Lower Lid, Right Upper Lid (H01.001, H01.002,H01.004,H01.005) 3.  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  OAG BORDERLINE FINDINGS LOW RISK; Left Eye (H40.012) 6.  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  Cataract accounts for the patient's  decreased vision. This visual impairment is not correctable with a tolerable change in glasses or contact lenses. Cataract surgery with an implantation of a new lens should significantly improve the visual and functional status of the patient. Discussed all risks, benefits, alternatives, and potential complications. Discussed the procedures and recovery. Patient desires to have surgery. A-scan ordered and performed today for intra-ocular lens calculations. The surgery will be performed in order to improve vision for driving, reading, and for eye examinations. Recommend phacoemulsification with intra-ocular lens. Recommend Dextenza for post-operative pain and inflammation. Left Eye only.. Dilates well - shugarcaine by protocol.  2.  Recommend regular lid cleaning.  3.  Asymptomatic.  4.  Observe; Artificial tears as needed for irritation.  5.  Based on CD ratio. Normal IOP. Will monitor.  6.  Vessels are tortuous. Continue good blood pressure control.

## 2021-09-21 ENCOUNTER — Ambulatory Visit: Payer: Medicare Other | Admitting: Internal Medicine

## 2021-09-21 ENCOUNTER — Telehealth: Payer: Self-pay | Admitting: Internal Medicine

## 2021-09-21 NOTE — Telephone Encounter (Signed)
Patient was advised to call [employer] to send back oxygen or whatever company to send it back to where she received it from

## 2021-09-21 NOTE — Telephone Encounter (Signed)
Pt called back stating she is still having issues with her oxygen. She asked for nurse to give her a call.

## 2021-09-24 ENCOUNTER — Ambulatory Visit (HOSPITAL_COMMUNITY): Payer: Medicare Other | Admitting: Anesthesiology

## 2021-09-24 ENCOUNTER — Encounter (HOSPITAL_COMMUNITY)
Admission: RE | Disposition: A | Discharge status: Home / Self Care | Payer: Self-pay | Source: Ambulatory Visit | Attending: Ophthalmology

## 2021-09-24 ENCOUNTER — Encounter (HOSPITAL_COMMUNITY): Payer: Self-pay | Admitting: Ophthalmology

## 2021-09-24 ENCOUNTER — Ambulatory Visit (HOSPITAL_BASED_OUTPATIENT_CLINIC_OR_DEPARTMENT_OTHER): Payer: Medicare Other | Admitting: Anesthesiology

## 2021-09-24 ENCOUNTER — Ambulatory Visit (HOSPITAL_COMMUNITY)
Admission: RE | Admit: 2021-09-24 | Discharge: 2021-09-24 | Disposition: A | Discharge status: Home / Self Care | Payer: Medicare Other | Source: Ambulatory Visit | Attending: Ophthalmology | Admitting: Ophthalmology

## 2021-09-24 DIAGNOSIS — H0100B Unspecified blepharitis left eye, upper and lower eyelids: Secondary | ICD-10-CM | POA: Insufficient documentation

## 2021-09-24 DIAGNOSIS — E1136 Type 2 diabetes mellitus with diabetic cataract: Secondary | ICD-10-CM | POA: Insufficient documentation

## 2021-09-24 DIAGNOSIS — I110 Hypertensive heart disease with heart failure: Secondary | ICD-10-CM | POA: Diagnosis not present

## 2021-09-24 DIAGNOSIS — H11152 Pinguecula, left eye: Secondary | ICD-10-CM | POA: Insufficient documentation

## 2021-09-24 DIAGNOSIS — Z7984 Long term (current) use of oral hypoglycemic drugs: Secondary | ICD-10-CM | POA: Diagnosis not present

## 2021-09-24 DIAGNOSIS — I509 Heart failure, unspecified: Secondary | ICD-10-CM | POA: Diagnosis not present

## 2021-09-24 DIAGNOSIS — H269 Unspecified cataract: Secondary | ICD-10-CM

## 2021-09-24 DIAGNOSIS — H11822 Conjunctivochalasis, left eye: Secondary | ICD-10-CM | POA: Insufficient documentation

## 2021-09-24 DIAGNOSIS — J449 Chronic obstructive pulmonary disease, unspecified: Secondary | ICD-10-CM

## 2021-09-24 DIAGNOSIS — I5032 Chronic diastolic (congestive) heart failure: Secondary | ICD-10-CM

## 2021-09-24 DIAGNOSIS — K219 Gastro-esophageal reflux disease without esophagitis: Secondary | ICD-10-CM | POA: Insufficient documentation

## 2021-09-24 DIAGNOSIS — H25812 Combined forms of age-related cataract, left eye: Secondary | ICD-10-CM | POA: Insufficient documentation

## 2021-09-24 DIAGNOSIS — Z9981 Dependence on supplemental oxygen: Secondary | ICD-10-CM | POA: Diagnosis not present

## 2021-09-24 DIAGNOSIS — F17200 Nicotine dependence, unspecified, uncomplicated: Secondary | ICD-10-CM | POA: Diagnosis not present

## 2021-09-24 DIAGNOSIS — E119 Type 2 diabetes mellitus without complications: Secondary | ICD-10-CM | POA: Diagnosis not present

## 2021-09-24 DIAGNOSIS — H0100A Unspecified blepharitis right eye, upper and lower eyelids: Secondary | ICD-10-CM | POA: Insufficient documentation

## 2021-09-24 DIAGNOSIS — H25092 Other age-related incipient cataract, left eye: Secondary | ICD-10-CM

## 2021-09-24 DIAGNOSIS — H35032 Hypertensive retinopathy, left eye: Secondary | ICD-10-CM | POA: Insufficient documentation

## 2021-09-24 DIAGNOSIS — F17210 Nicotine dependence, cigarettes, uncomplicated: Secondary | ICD-10-CM | POA: Diagnosis not present

## 2021-09-24 HISTORY — PX: CATARACT EXTRACTION W/PHACO: SHX586

## 2021-09-24 LAB — GLUCOSE, CAPILLARY: Glucose-Capillary: 98 mg/dL (ref 70–99)

## 2021-09-24 SURGERY — PHACOEMULSIFICATION, CATARACT, WITH IOL INSERTION
Anesthesia: Monitor Anesthesia Care | Site: Eye | Laterality: Left

## 2021-09-24 MED ORDER — EPINEPHRINE PF 1 MG/ML IJ SOLN
INTRAOCULAR | Status: DC | PRN
  Administered 2021-09-24: 500 mL

## 2021-09-24 MED ORDER — PHENYLEPHRINE HCL 2.5 % OP SOLN
1.0000 [drp] | OPHTHALMIC | Status: AC | PRN
  Administered 2021-09-24 (×3): 1 [drp] via OPHTHALMIC

## 2021-09-24 MED ORDER — TROPICAMIDE 1 % OP SOLN
1.0000 [drp] | OPHTHALMIC | Status: AC | PRN
  Administered 2021-09-24 (×3): 1 [drp] via OPHTHALMIC

## 2021-09-24 MED ORDER — EPINEPHRINE PF 1 MG/ML IJ SOLN
INTRAMUSCULAR | Status: AC
  Filled 2021-09-24: qty 2

## 2021-09-24 MED ORDER — TETRACAINE HCL 0.5 % OP SOLN
1.0000 [drp] | OPHTHALMIC | Status: AC | PRN
  Administered 2021-09-24 (×3): 1 [drp] via OPHTHALMIC

## 2021-09-24 MED ORDER — SODIUM HYALURONATE 10 MG/ML IO SOLUTION
PREFILLED_SYRINGE | INTRAOCULAR | Status: DC | PRN
  Administered 2021-09-24: 0.85 mL via INTRAOCULAR

## 2021-09-24 MED ORDER — LIDOCAINE HCL 3.5 % OP GEL
1.0000 | Freq: Once | OPHTHALMIC | Status: AC
  Administered 2021-09-24: 1 via OPHTHALMIC

## 2021-09-24 MED ORDER — BSS IO SOLN
INTRAOCULAR | Status: DC | PRN
  Administered 2021-09-24: 15 mL via INTRAOCULAR

## 2021-09-24 MED ORDER — LIDOCAINE HCL (PF) 1 % IJ SOLN
INTRAOCULAR | Status: DC | PRN
  Administered 2021-09-24: 1 mL via OPHTHALMIC

## 2021-09-24 MED ORDER — STERILE WATER FOR IRRIGATION IR SOLN
Status: DC | PRN
  Administered 2021-09-24: 250 mL

## 2021-09-24 MED ORDER — POVIDONE-IODINE 5 % OP SOLN
OPHTHALMIC | Status: DC | PRN
  Administered 2021-09-24: 1 via OPHTHALMIC

## 2021-09-24 MED ORDER — NEOMYCIN-POLYMYXIN-DEXAMETH 3.5-10000-0.1 OP SUSP
OPHTHALMIC | Status: DC | PRN
  Administered 2021-09-24: 1 [drp] via OPHTHALMIC

## 2021-09-24 MED ORDER — SODIUM HYALURONATE 23MG/ML IO SOSY
PREFILLED_SYRINGE | INTRAOCULAR | Status: DC | PRN
  Administered 2021-09-24: 0.6 mL via INTRAOCULAR

## 2021-09-24 SURGICAL SUPPLY — 13 items
CATARACT SUITE SIGHTPATH (MISCELLANEOUS) ×2 IMPLANT
CLOTH BEACON ORANGE TIMEOUT ST (SAFETY) ×3 IMPLANT
EYE SHIELD UNIVERSAL CLEAR (GAUZE/BANDAGES/DRESSINGS) ×1 IMPLANT
FEE CATARACT SUITE SIGHTPATH (MISCELLANEOUS) ×2 IMPLANT
GLOVE BIOGEL PI IND STRL 7.0 (GLOVE) ×4 IMPLANT
GLOVE BIOGEL PI INDICATOR 7.0 (GLOVE) ×2
LENS IOL RAYNER 21.0 (Intraocular Lens) ×2 IMPLANT
LENS IOL RAYONE EMV 21.0 (Intraocular Lens) IMPLANT
PAD ARMBOARD 7.5X6 YLW CONV (MISCELLANEOUS) ×3 IMPLANT
SYR TB 1ML LL NO SAFETY (SYRINGE) ×3 IMPLANT
TAPE SURG TRANSPORE 1 IN (GAUZE/BANDAGES/DRESSINGS) IMPLANT
TAPE SURGICAL TRANSPORE 1 IN (GAUZE/BANDAGES/DRESSINGS) ×2
WATER STERILE IRR 250ML POUR (IV SOLUTION) ×3 IMPLANT

## 2021-09-24 NOTE — Op Note (Signed)
Date of procedure: 09/24/21  Pre-operative diagnosis: Visually significant age-related combined cataract, Left Eye (H25.812)  Post-operative diagnosis: Visually significant age-related combined cataract, Left Eye (H25.812)  Procedure: Removal of cataract via phacoemulsification and insertion of intra-ocular lens Rayner [id number] +21.0D into the capsular bag of the Left Eye  Attending surgeon: Gerda Diss. Angelic Schnelle, MD, MA  Anesthesia: MAC, Topical Akten  Complications: None  Estimated Blood Loss: <60m (minimal)  Specimens: None  Implants: As above  Indications:  Visually significant age-related cataract, Left Eye  Procedure:  The patient was seen and identified in the pre-operative area. The operative eye was identified and dilated.  The operative eye was marked.  Topical anesthesia was administered to the operative eye.     The patient was then to the operative suite and placed in the supine position.  A timeout was performed confirming the patient, procedure to be performed, and all other relevant information.   The patient's face was prepped and draped in the usual fashion for intra-ocular surgery.  A lid speculum was placed into the operative eye and the surgical microscope moved into place and focused.  An inferotemporal paracentesis was created using a 20 gauge paracentesis blade.  Shugarcaine was injected into the anterior chamber.  Viscoelastic was injected into the anterior chamber.  A temporal clear-corneal main wound incision was created using a 2.463mmicrokeratome.  A continuous curvilinear capsulorrhexis was initiated using an irrigating cystitome and completed using capsulorrhexis forceps.  Hydrodissection and hydrodeliniation were performed.  Viscoelastic was injected into the anterior chamber.  A phacoemulsification handpiece and a chopper as a second instrument were used to remove the nucleus and epinucleus. The irrigation/aspiration handpiece was used to remove any remaining  cortical material.   The capsular bag was reinflated with viscoelastic, checked, and found to be intact.  The intraocular lens was inserted into the capsular bag.  The irrigation/aspiration handpiece was used to remove any remaining viscoelastic.  The clear corneal wound and paracentesis wounds were then hydrated and checked with Weck-Cels to be watertight.  Maxitrol was instilled in the eye. The lid-speculum was removed.  The drape was removed.  The patient's face was cleaned with a wet and dry 4x4.    A clear shield was taped over the eye. The patient was taken to the post-operative care unit in good condition, having tolerated the procedure well.  Post-Op Instructions: [facility] and will receive all other orders and instructions.

## 2021-09-24 NOTE — Anesthesia Postprocedure Evaluation (Signed)
Anesthesia Post Note  Patient: SUSETTE SEMINARA  Procedure(s) Performed: CATARACT EXTRACTION PHACO AND INTRAOCULAR LENS PLACEMENT (IOC) (Left: Eye)  Patient location during evaluation: Short Stay Anesthesia Type: MAC Level of consciousness: awake and alert Pain management: pain level controlled Vital Signs Assessment: post-procedure vital signs reviewed and stable Respiratory status: spontaneous breathing Cardiovascular status: blood pressure returned to baseline and stable Postop Assessment: no apparent nausea or vomiting Anesthetic complications: no   No notable events documented.   Last Vitals:  Vitals:   09/24/21 1231  BP: (!) 141/54  Pulse: 70  Resp: 20  Temp: 36.8 C  SpO2: 97%    Last Pain:  Vitals:   09/24/21 1231  TempSrc: Oral  PainSc: 0-No pain                 Meela Wareing

## 2021-09-24 NOTE — Anesthesia Preprocedure Evaluation (Addendum)
Anesthesia Evaluation  Patient identified by MRN, date of birth, ID band Patient awake    Reviewed: Allergy & Precautions, NPO status , Patient's Chart, lab work & pertinent test results  History of Anesthesia Complications Negative for: history of anesthetic complications  Airway Mallampati: II  TM Distance: >3 FB Neck ROM: Full    Dental  (+) Dental Advisory Given, Missing   Pulmonary pneumonia (aspiration), COPD,  COPD inhaler and oxygen dependent, Current Smoker and Patient abstained from smoking., PE   Pulmonary exam normal breath sounds clear to auscultation       Cardiovascular hypertension, Pt. on medications +CHF  Normal cardiovascular exam Rhythm:Regular Rate:Normal  15-Aug-2021 14:43:47 [facility]-AP-ED ROUTINE RECORD 1940-10-26 (40 yr) Female Black Vent. rate 63 BPM PR interval 224 ms QRS duration 82 ms QT/QTcB 360/368 ms P-R-T axes 35 175 23 Sinus rhythm with sinus arrhythmia with 1st degree A-V block Right axis deviation , new since last tracing Possible Right ventricular hypertrophy Cannot rule out Anterior infarct , age undetermined Abnormal ECG When compared with ECG of 13-Mar-2021 14:38, PREVIOUS ECG IS PRESENT Confirmed by Dorie Rank [phone]) on 08/15/2021 5:46:16 PM   Neuro/Psych negative neurological ROS  negative psych ROS   GI/Hepatic Neg liver ROS, GERD  Medicated and Controlled,  Endo/Other  diabetes, Well Controlled, Type 2, Oral Hypoglycemic Agents  Renal/GU negative Renal ROS  negative genitourinary   Musculoskeletal negative musculoskeletal ROS (+)   Abdominal   Peds negative pediatric ROS (+)  Hematology  (+) Blood dyscrasia, anemia ,   Anesthesia Other Findings 1. Left ventricular ejection fraction, by estimation, is 55 to 60%. The  left ventricle has normal function. The left ventricle has no regional  wall motion abnormalities. Left ventricular diastolic  parameters are  consistent with Grade II diastolic  dysfunction (pseudonormalization).  2. Right ventricular systolic function is mildly reduced. The right  ventricular size is mildly enlarged. There is moderately elevated  pulmonary artery systolic pressure. The estimated right ventricular  systolic pressure is 16.0 mmHg.  3. Left atrial size was moderately dilated.  4. Right atrial size was moderately dilated.  5. The mitral valve is grossly normal. Trivial mitral valve  regurgitation.  6. The aortic valve is tricuspid. Aortic valve regurgitation is not  visualized. Aortic valve sclerosis is present, with no evidence of aortic  valve stenosis.  7. The inferior vena cava is dilated in size with <50% respiratory  variability, suggesting right atrial pressure of 15 mmHg.   Reproductive/Obstetrics negative OB ROS                           Anesthesia Physical Anesthesia Plan  ASA: 4  Anesthesia Plan: MAC   Post-op Pain Management: Minimal or no pain anticipated   Induction:   PONV Risk Score and Plan:   Airway Management Planned:   Additional Equipment:   Intra-op Plan:   Post-operative Plan:   Informed Consent: I have reviewed the patients History and Physical, chart, labs and discussed the procedure including the risks, benefits and alternatives for the proposed anesthesia with the patient or authorized representative who has indicated his/her understanding and acceptance.     Dental advisory given  Plan Discussed with: CRNA and Surgeon  Anesthesia Plan Comments:         Anesthesia Quick Evaluation

## 2021-09-24 NOTE — Discharge Instructions (Signed)
Please discharge patient when stable, will follow up today with Dr. Elienai Gailey at the [facility] [place] office immediately following discharge.  Leave shield in place until visit.  All paperwork with discharge instructions will be given at the office.  [facility] [place] Address:  [address]  [place], [place] [address]  

## 2021-09-24 NOTE — Transfer of Care (Addendum)
36.8Immediate Anesthesia Transfer of Care Note  Patient: Marisa Bailey  Procedure(s) Performed: CATARACT EXTRACTION PHACO AND INTRAOCULAR LENS PLACEMENT (IOC) (Left: Eye)  Patient Location: Short Stay  Anesthesia Type:MAC  Level of Consciousness: awake  Airway & Oxygen Therapy: Patient Spontanous Breathing  Post-op Assessment: Report given to RN  Post vital signs: Reviewed and stable  Last Vitals:  Vitals Value Taken Time  BP 143/59   Temp 36.8   Pulse 69   Resp 18   SpO2 92     Last Pain:  Vitals:   09/24/21 1231  TempSrc: Oral  PainSc: 0-No pain      Patients Stated Pain Goal: 8 (16/55/37 4827)  Complications: No notable events documented.

## 2021-09-24 NOTE — Interval H&P Note (Signed)
History and Physical Interval Note:  09/24/2021 1:06 PM  Marisa Bailey  has presented today for surgery, with the diagnosis of combined forms age related cataract; left.  The various methods of treatment have been discussed with the patient and family. After consideration of risks, benefits and other options for treatment, the patient has consented to  Procedure(s) with comments: CATARACT EXTRACTION PHACO AND INTRAOCULAR LENS PLACEMENT ([place]) (Left) - left as a surgical intervention.  The patient's history has been reviewed, patient examined, no change in status, stable for surgery.  I have reviewed the patient's chart and labs.  Questions were answered to the patient's satisfaction.     Baruch Goldmann

## 2021-09-25 ENCOUNTER — Encounter (HOSPITAL_COMMUNITY): Payer: Self-pay | Admitting: Ophthalmology

## 2021-09-26 DIAGNOSIS — J449 Chronic obstructive pulmonary disease, unspecified: Secondary | ICD-10-CM | POA: Diagnosis not present

## 2021-09-26 NOTE — Telephone Encounter (Signed)
Patient called in regard to oxygen machine movement.  Patient states that she spoke with [employer] and they cannot move oxygen machine  with out provider release.

## 2021-09-26 NOTE — Telephone Encounter (Signed)
We have asked that she call [employer] and have [employer] fax [place] the paperwork we have not received any paperwork. Oxygen generally doesn't come from [employer] so she will need to oxygen supplier not insurance please let her know

## 2021-09-26 NOTE — Telephone Encounter (Signed)
LVM for pt to let her know [employer] rep will be out and assist her with calling the oxygen company to pick up old tanks

## 2021-10-09 ENCOUNTER — Other Ambulatory Visit: Payer: Self-pay | Admitting: Internal Medicine

## 2021-10-18 DIAGNOSIS — J449 Chronic obstructive pulmonary disease, unspecified: Secondary | ICD-10-CM | POA: Diagnosis not present

## 2021-10-24 ENCOUNTER — Other Ambulatory Visit: Payer: Self-pay

## 2021-10-24 ENCOUNTER — Encounter (HOSPITAL_COMMUNITY): Payer: Self-pay | Admitting: Emergency Medicine

## 2021-10-24 ENCOUNTER — Inpatient Hospital Stay (HOSPITAL_COMMUNITY)
Admission: EM | Admit: 2021-10-24 | Discharge: 2021-10-27 | DRG: 378 | Disposition: A | Discharge status: Home / Self Care | Payer: Medicare Other | Attending: Family Medicine | Admitting: Family Medicine

## 2021-10-24 DIAGNOSIS — J9611 Chronic respiratory failure with hypoxia: Secondary | ICD-10-CM | POA: Diagnosis not present

## 2021-10-24 DIAGNOSIS — K3182 Dieulafoy lesion (hemorrhagic) of stomach and duodenum: Secondary | ICD-10-CM | POA: Diagnosis not present

## 2021-10-24 DIAGNOSIS — Z7901 Long term (current) use of anticoagulants: Secondary | ICD-10-CM | POA: Diagnosis not present

## 2021-10-24 DIAGNOSIS — K922 Gastrointestinal hemorrhage, unspecified: Secondary | ICD-10-CM | POA: Diagnosis not present

## 2021-10-24 DIAGNOSIS — E785 Hyperlipidemia, unspecified: Secondary | ICD-10-CM | POA: Diagnosis present

## 2021-10-24 DIAGNOSIS — Z833 Family history of diabetes mellitus: Secondary | ICD-10-CM | POA: Diagnosis not present

## 2021-10-24 DIAGNOSIS — I2723 Pulmonary hypertension due to lung diseases and hypoxia: Secondary | ICD-10-CM | POA: Diagnosis not present

## 2021-10-24 DIAGNOSIS — H5461 Unqualified visual loss, right eye, normal vision left eye: Secondary | ICD-10-CM | POA: Diagnosis not present

## 2021-10-24 DIAGNOSIS — Z9981 Dependence on supplemental oxygen: Secondary | ICD-10-CM

## 2021-10-24 DIAGNOSIS — J439 Emphysema, unspecified: Secondary | ICD-10-CM | POA: Diagnosis not present

## 2021-10-24 DIAGNOSIS — Z880 Allergy status to penicillin: Secondary | ICD-10-CM

## 2021-10-24 DIAGNOSIS — F17210 Nicotine dependence, cigarettes, uncomplicated: Secondary | ICD-10-CM | POA: Diagnosis present

## 2021-10-24 DIAGNOSIS — N179 Acute kidney failure, unspecified: Secondary | ICD-10-CM | POA: Diagnosis not present

## 2021-10-24 DIAGNOSIS — D509 Iron deficiency anemia, unspecified: Secondary | ICD-10-CM | POA: Diagnosis not present

## 2021-10-24 DIAGNOSIS — E119 Type 2 diabetes mellitus without complications: Secondary | ICD-10-CM | POA: Diagnosis present

## 2021-10-24 DIAGNOSIS — I2699 Other pulmonary embolism without acute cor pulmonale: Secondary | ICD-10-CM | POA: Diagnosis present

## 2021-10-24 DIAGNOSIS — I110 Hypertensive heart disease with heart failure: Secondary | ICD-10-CM | POA: Diagnosis present

## 2021-10-24 DIAGNOSIS — D62 Acute posthemorrhagic anemia: Secondary | ICD-10-CM | POA: Diagnosis present

## 2021-10-24 DIAGNOSIS — I5030 Unspecified diastolic (congestive) heart failure: Secondary | ICD-10-CM | POA: Diagnosis present

## 2021-10-24 DIAGNOSIS — J449 Chronic obstructive pulmonary disease, unspecified: Secondary | ICD-10-CM | POA: Diagnosis not present

## 2021-10-24 DIAGNOSIS — Z79899 Other long term (current) drug therapy: Secondary | ICD-10-CM

## 2021-10-24 DIAGNOSIS — K921 Melena: Secondary | ICD-10-CM

## 2021-10-24 DIAGNOSIS — Z7984 Long term (current) use of oral hypoglycemic drugs: Secondary | ICD-10-CM

## 2021-10-24 DIAGNOSIS — I10 Essential (primary) hypertension: Secondary | ICD-10-CM | POA: Diagnosis not present

## 2021-10-24 DIAGNOSIS — I5032 Chronic diastolic (congestive) heart failure: Secondary | ICD-10-CM | POA: Diagnosis present

## 2021-10-24 DIAGNOSIS — D649 Anemia, unspecified: Secondary | ICD-10-CM | POA: Diagnosis not present

## 2021-10-24 DIAGNOSIS — R54 Age-related physical debility: Secondary | ICD-10-CM | POA: Diagnosis not present

## 2021-10-24 DIAGNOSIS — H5440 Blindness, one eye, unspecified eye: Secondary | ICD-10-CM | POA: Diagnosis not present

## 2021-10-24 DIAGNOSIS — I2694 Multiple subsegmental pulmonary emboli without acute cor pulmonale: Secondary | ICD-10-CM

## 2021-10-24 DIAGNOSIS — Z86711 Personal history of pulmonary embolism: Secondary | ICD-10-CM

## 2021-10-24 DIAGNOSIS — K449 Diaphragmatic hernia without obstruction or gangrene: Secondary | ICD-10-CM | POA: Diagnosis present

## 2021-10-24 HISTORY — DX: Gastrointestinal hemorrhage, unspecified: K92.2

## 2021-10-24 LAB — URINALYSIS, ROUTINE W REFLEX MICROSCOPIC
Bilirubin Urine: NEGATIVE
Glucose, UA: NEGATIVE mg/dL
Hgb urine dipstick: NEGATIVE
Ketones, ur: NEGATIVE mg/dL
Leukocytes,Ua: NEGATIVE
Nitrite: NEGATIVE
Protein, ur: NEGATIVE mg/dL
Specific Gravity, Urine: 1.012 (ref 1.005–1.030)
pH: 5 (ref 5.0–8.0)

## 2021-10-24 LAB — POC OCCULT BLOOD, ED: Fecal Occult Bld: POSITIVE — AB

## 2021-10-24 LAB — BASIC METABOLIC PANEL
Anion gap: 9 (ref 5–15)
BUN: 59 mg/dL — ABNORMAL HIGH (ref 8–23)
CO2: 25 mmol/L (ref 22–32)
Calcium: 8.9 mg/dL (ref 8.9–10.3)
Chloride: 104 mmol/L (ref 98–111)
Creatinine, Ser: 1.23 mg/dL — ABNORMAL HIGH (ref 0.44–1.00)
GFR, Estimated: 44 mL/min — ABNORMAL LOW (ref >60)
Glucose, Bld: 87 mg/dL (ref 70–99)
Potassium: 4.7 mmol/L (ref 3.5–5.1)
Sodium: 138 mmol/L (ref 135–145)

## 2021-10-24 LAB — CBC
HCT: 24.3 % — ABNORMAL LOW (ref 36.0–46.0)
Hemoglobin: 7.1 g/dL — ABNORMAL LOW (ref 12.0–15.0)
MCH: 25.4 pg — ABNORMAL LOW (ref 26.0–34.0)
MCHC: 29.2 g/dL — ABNORMAL LOW (ref 30.0–36.0)
MCV: 87.1 fL (ref 80.0–100.0)
Platelets: 261 10*3/uL (ref 150–400)
RBC: 2.79 MIL/uL — ABNORMAL LOW (ref 3.87–5.11)
RDW: 20.9 % — ABNORMAL HIGH (ref 11.5–15.5)
WBC: 7.8 10*3/uL (ref 4.0–10.5)
nRBC: 0 % (ref 0.0–0.2)

## 2021-10-24 LAB — ABO/RH: ABO/RH(D): B POS

## 2021-10-24 LAB — VITAMIN B12: Vitamin B-12: 247 pg/mL (ref 180–914)

## 2021-10-24 LAB — PREPARE RBC (CROSSMATCH)

## 2021-10-24 MED ORDER — HYDRALAZINE HCL 20 MG/ML IJ SOLN: 5.0000 mg | Freq: Four times a day (QID) | INTRAMUSCULAR | Status: DC | PRN

## 2021-10-24 MED ORDER — LACTATED RINGERS IV BOLUS
500.0000 mL | Freq: Once | INTRAVENOUS | Status: AC
  Administered 2021-10-24: 500 mL via INTRAVENOUS

## 2021-10-24 MED ORDER — FOLIC ACID 1 MG PO TABS
1.0000 mg | ORAL_TABLET | Freq: Every day | ORAL | Status: DC
  Administered 2021-10-24 – 2021-10-27 (×4): 1 mg via ORAL
  Filled 2021-10-24 (×4): qty 1

## 2021-10-24 MED ORDER — ACETAMINOPHEN 650 MG RE SUPP: 650.0000 mg | Freq: Four times a day (QID) | RECTAL | Status: DC | PRN

## 2021-10-24 MED ORDER — PANTOPRAZOLE INFUSION (NEW) - SIMPLE MED
8.0000 mg/h | INTRAVENOUS | Status: DC
  Administered 2021-10-24: 8 mg/h via INTRAVENOUS
  Filled 2021-10-24 (×5): qty 100
  Filled 2021-10-24: qty 80
  Filled 2021-10-24 (×2): qty 100

## 2021-10-24 MED ORDER — ALBUTEROL SULFATE (2.5 MG/3ML) 0.083% IN NEBU: 2.5000 mg | INHALATION_SOLUTION | Freq: Four times a day (QID) | RESPIRATORY_TRACT | Status: DC | PRN

## 2021-10-24 MED ORDER — ATORVASTATIN CALCIUM 10 MG PO TABS
20.0000 mg | ORAL_TABLET | Freq: Every day | ORAL | Status: DC
  Administered 2021-10-24 – 2021-10-27 (×4): 20 mg via ORAL
  Filled 2021-10-24 (×4): qty 2

## 2021-10-24 MED ORDER — PANTOPRAZOLE SODIUM 40 MG IV SOLR: 40.0000 mg | Freq: Two times a day (BID) | INTRAVENOUS | Status: DC

## 2021-10-24 MED ORDER — ACETAMINOPHEN 325 MG PO TABS
650.0000 mg | ORAL_TABLET | Freq: Four times a day (QID) | ORAL | Status: DC | PRN
  Administered 2021-10-25 – 2021-10-26 (×2): 650 mg via ORAL
  Filled 2021-10-24 (×2): qty 2

## 2021-10-24 MED ORDER — SODIUM CHLORIDE 0.9 % IV SOLN
10.0000 mL/h | Freq: Once | INTRAVENOUS | Status: AC
  Administered 2021-10-24: 10 mL/h via INTRAVENOUS

## 2021-10-24 MED ORDER — LACTATED RINGERS IV SOLN: INTRAVENOUS | Status: DC

## 2021-10-24 MED ORDER — PANTOPRAZOLE SODIUM 40 MG IV SOLR
40.0000 mg | Freq: Two times a day (BID) | INTRAVENOUS | Status: DC
  Administered 2021-10-24: 40 mg via INTRAVENOUS
  Filled 2021-10-24: qty 10

## 2021-10-24 MED ORDER — PANTOPRAZOLE 80MG IVPB - SIMPLE MED
80.0000 mg | Freq: Once | INTRAVENOUS | Status: AC
  Administered 2021-10-24: 80 mg via INTRAVENOUS
  Filled 2021-10-24: qty 100

## 2021-10-24 NOTE — ED Provider Notes (Signed)
[facility] Provider Note   CSN: [id number] Arrival date & time: 10/24/21  1342     History  Chief Complaint  Patient presents with   Weakness    Marisa Bailey is a 81 y.o. female.   Weakness Patient presents for generalized weakness.  Onset was today.  Medical history includes COPD, HTN, CHF, DM, HLD, anemia.  Onset was this morning.  She states that she woke up in her normal state of health.  Patient lives alone independently.  She denies any discomfort, shortness of breath, dizziness.  She simply states that she felt unwell and weak.  When she began to feel the symptoms, she went to the kitchen and his oatmeal and drink some coffee.  She continued to feel generalized weakness and had her landlord bring her to the ED.  She currently endorses improved symptoms.  History per niece: Patient was endorsing dizziness and lightheadedness yesterday.  She has not had any other recent complaints.  Per chart review, home medications include amlodipine, Norco, olmesartan, Xarelto, metformin.     Home Medications Prior to Admission medications   Medication Sig Start Date End Date Taking? Authorizing Provider  albuterol (VENTOLIN HFA) 108 (90 Base) MCG/ACT inhaler Inhale 2 puffs into the lungs every 6 (six) hours as needed for wheezing or shortness of breath. 03/15/21  Yes Mercy Riding, MD  amLODipine (NORVASC) 10 MG tablet Take 1 tablet by mouth once daily 09/06/21  Yes Patel, Colin Broach, MD  atorvastatin (LIPITOR) 20 MG tablet TAKE 1 TABLET BY MOUTH DAILY 05/30/21  Yes [name], MD  Cholecalciferol (VITAMIN D3) 25 MCG (1000 UT) CAPS Take 1 capsule by mouth daily.   Yes [provider]  folic acid (FOLVITE) 1 MG tablet Take 1 tablet by mouth once daily 10/10/21  Yes Patel, Colin Broach, MD  ILEVRO 0.3 % ophthalmic suspension 1 drop daily. 08/06/21  Yes [provider]  metFORMIN (GLUCOPHAGE) 500 MG tablet Take 1 tablet (500 mg total) by mouth 2 (two) times  daily. 09/11/21  Yes [name], MD  olmesartan (BENICAR) 40 MG tablet TAKE 1 TABLET BY MOUTH DAILY 06/18/21  Yes [name], MD  pantoprazole (PROTONIX) 40 MG tablet Take 1 tablet (40 mg total) by mouth daily. 08/16/21 08/16/22 Yes Barton Dubois, MD  prednisoLONE acetate (PRED FORTE) 1 % ophthalmic suspension 1 drop 2 (two) times daily. 08/06/21  Yes [provider]  rivaroxaban (XARELTO) 20 MG TABS tablet Take 1 tablet (20 mg total) by mouth daily with supper. 09/11/21  Yes [name], MD  albuterol (PROVENTIL) (2.5 MG/3ML) 0.083% nebulizer solution Take 3 mLs (2.5 mg total) by nebulization every 6 (six) hours as needed for wheezing or shortness of breath. Patient not taking: Reported on 10/24/2021 03/15/21   Mercy Riding, MD  HYDROcodone-acetaminophen (NORCO/VICODIN) 5-325 MG tablet Take 1 tablet by mouth every 6 (six) hours as needed for severe pain. Patient not taking: Reported on 10/24/2021 08/16/21   Barton Dubois, MD  moxifloxacin (VIGAMOX) 0.5 % ophthalmic solution Apply 1 drop to eye 3 (three) times daily. Patient not taking: Reported on 10/24/2021 08/06/21   [provider]      Allergies    Penicillins    Review of Systems   Review of Systems  Neurological:  Positive for weakness (Generalized).  All other systems reviewed and are negative.   Physical Exam Updated Vital Signs BP (!) 124/55 (BP Location: Left Arm)   Pulse 74  Temp 98.4 F (36.9 C) (Oral)   Resp 18   Ht _0  (1.626 m)   Wt 63.1 kg   SpO2 100%   BMI 23.88 kg/m  Physical Exam Vitals and nursing note reviewed.  Constitutional:      General: She is not in acute distress.    Appearance: Normal appearance. She is well-developed. She is not ill-appearing, toxic-appearing or diaphoretic.  HENT:     Head: Normocephalic and atraumatic.     Right Ear: External ear normal.     Left Ear: External ear normal.     Nose: Nose normal.     Mouth/Throat:     Mouth: Mucous membranes are moist.      Pharynx: Oropharynx is clear.  Eyes:     Conjunctiva/sclera: Conjunctivae normal.     Comments: Right prosthetic eye, chronic right ptosis.  Cardiovascular:     Rate and Rhythm: Normal rate and regular rhythm.     Heart sounds: No murmur heard. Pulmonary:     Effort: Pulmonary effort is normal. No respiratory distress.     Breath sounds: Normal breath sounds. No wheezing or rales.  Chest:     Chest wall: No tenderness.  Abdominal:     General: There is no distension.     Palpations: Abdomen is soft.     Tenderness: There is no abdominal tenderness.  Musculoskeletal:        General: No swelling. Normal range of motion.     Cervical back: Normal range of motion and neck supple.     Right lower leg: No edema.     Left lower leg: No edema.  Skin:    General: Skin is warm and dry.     Coloration: Skin is not jaundiced or pale.  Neurological:     General: No focal deficit present.     Mental Status: She is alert and oriented to person, place, and time.     Cranial Nerves: No cranial nerve deficit.     Sensory: No sensory deficit.     Motor: No weakness.     Coordination: Coordination normal.  Psychiatric:        Mood and Affect: Mood normal.        Behavior: Behavior normal.        Thought Content: Thought content normal.        Judgment: Judgment normal.     ED Results / Procedures / Treatments   Labs (all labs ordered are listed, but only abnormal results are displayed) Labs Reviewed  BASIC METABOLIC PANEL - Abnormal; Notable for the following components:      Result Value   BUN 59 (*)    Creatinine, Ser 1.23 (*)    GFR, Estimated 44 (*)    All other components within normal limits  CBC - Abnormal; Notable for the following components:   RBC 2.79 (*)    Hemoglobin 7.1 (*)    HCT 24.3 (*)    MCH 25.4 (*)    MCHC 29.2 (*)    RDW 20.9 (*)    All other components within normal limits  URINALYSIS, ROUTINE W REFLEX MICROSCOPIC - Abnormal; Notable for the following  components:   Color, Urine STRAW (*)    All other components within normal limits  POC OCCULT BLOOD, ED - Abnormal; Notable for the following components:   Fecal Occult Bld POSITIVE (*)    All other components within normal limits  VITAMIN B12  CBC  BASIC METABOLIC PANEL  PREPARE RBC (CROSSMATCH)  TYPE AND SCREEN  ABO/RH    EKG EKG Interpretation  Date/Time:  Wednesday October 24 2021 17:19:02 EDT Ventricular Rate:  72 PR Interval:  208 QRS Duration: 87 QT Interval:  370 QTC Calculation: 405 R Axis:   -41 Text Interpretation: Sinus rhythm Confirmed by Godfrey Pick [id number]) on 10/24/2021 5:50:32 PM  Radiology No results found.  Procedures Procedures    Medications Ordered in ED Medications  pantoprozole (PROTONIX) 80 mg /NS 100 mL infusion (8 mg/hr Intravenous New Bag/Given 10/24/21 2223)  pantoprazole (PROTONIX) injection 40 mg (has no administration in time range)  atorvastatin (LIPITOR) tablet 20 mg (20 mg Oral Given 06/20/30 6712)  folic acid (FOLVITE) tablet 1 mg (1 mg Oral Given 10/24/21 2218)  albuterol (PROVENTIL) (2.5 MG/3ML) 0.083% nebulizer solution 2.5 mg (has no administration in time range)  acetaminophen (TYLENOL) tablet 650 mg (has no administration in time range)    Or  acetaminophen (TYLENOL) suppository 650 mg (has no administration in time range)  hydrALAZINE (APRESOLINE) injection 5 mg (has no administration in time range)  lactated ringers infusion ( Intravenous New Bag/Given 10/24/21 2221)  Oral care mouth rinse (has no administration in time range)  0.9 %  sodium chloride infusion (10 mL/hr Intravenous New Bag/Given 10/24/21 2037)  lactated ringers bolus 500 mL (500 mLs Intravenous New Bag/Given 10/24/21 1834)  pantoprazole (PROTONIX) 80 mg /NS 100 mL IVPB (80 mg Intravenous New Bag/Given 10/24/21 2223)    ED Course/ Medical Decision Making/ A&P                           Medical Decision Making Amount and/or Complexity of Data Reviewed Labs:  ordered.  Risk Prescription drug management. Decision regarding hospitalization.   This patient presents to the ED for concern of generalized weakness, this involves an extensive number of treatment options, and is a complaint that carries with it a high risk of complications and morbidity.  The differential diagnosis includes infection, dehydration, anemia, polypharmacy, metabolic derangements, depression   Co morbidities that complicate the patient evaluation  COPD, HTN, CHF, DM, HLD, anemia   Additional history obtained:  Additional history obtained from patient's niece External records from outside source obtained and reviewed including EMR   Lab Tests:  I Ordered, and personally interpreted labs.  The pertinent results include: 5 g/dL drop in hemoglobin compared to 1 month ago.  AKI is present.  Electrolytes are normal.  Cardiac Monitoring: / EKG:  The patient was maintained on a cardiac monitor.  I personally viewed and interpreted the cardiac monitored which showed an underlying rhythm of: Sinus rhythm   Consultations Obtained:  I requested consultation with the gastroenterologist, Dr. Abbey Chatters,  and discussed lab and imaging findings as well as pertinent plan - they recommend: Clear liquid diet today, n.p.o. at midnight for likely endoscopic tomorrow   Problem List / ED Course / Critical interventions / Medication management  Patient presents for a transient episode of generalized weakness this morning.  Patient's niece also states that patient was endorsing some noticeable symptoms yesterday.  Patient really lives alone.  Prior to being bedded in the ED, diagnostic work-up was initiated.  Initial vital signs are notable for soft blood pressures with widened pulse pressure.  Notable results of initial lab work include an acute drop in hemoglobin.  1 month ago, hemoglobin was in the range of 12.  Today it is 7.1.  She is on Xarelto.  Patient has  no leukocytosis.  She does  have a slight increase in creatinine and a significant increase in BUN.  This raises concern for prerenal AKI as well as upper GI bleed.  Rectal exam showed gross melena.  Patient was consented for PRBC transfusion for symptomatic anemia.  1 unit was ordered.  I spoke with gastroenterologist on-call, Dr. Abbey Chatters, who recommends clear liquid diet today and n.p.o. at midnight for likely endoscopic tomorrow.  Twice daily Protonix was ordered.  Patient was admitted to medicine for further management. I ordered medication including PRBCs for symptomatic anemia; IV fluid for hydration and AKI; Protonix for empiric treatment of upper GI bleed Reevaluation of the patient after these medicines showed that the patient improved I have reviewed the patients home medicines and have made adjustments as needed   Social Determinants of Health:  Lives independently   CRITICAL CARE Performed by: Godfrey Pick   Total critical care time: 35 minutes  Critical care time was exclusive of separately billable procedures and treating other patients.  Critical care was necessary to treat or prevent imminent or life-threatening deterioration.  Critical care was time spent personally by me on the following activities: development of treatment plan with patient and/or surrogate as well as nursing, discussions with consultants, evaluation of patient's response to treatment, examination of patient, obtaining history from patient or surrogate, ordering and performing treatments and interventions, ordering and review of laboratory studies, ordering and review of radiographic studies, pulse oximetry and re-evaluation of patient's condition.         Final Clinical Impression(s) / ED Diagnoses Final diagnoses:  Symptomatic anemia  Melena  AKI (acute kidney injury) [facility])    Rx / DC Orders ED Discharge Orders     None         Godfrey Pick, MD 10/25/21 0050

## 2021-10-24 NOTE — H&P (Signed)
TRH H&P   Patient Demographics:    Marisa Bailey, is a 81 y.o. female  MRN: [id number]   DOB - [birth year]  Admit Date - 10/24/2021  Outpatient Primary MD for the patient is [name], MD  Referring MD/NP/PA: Dr Doren Custard  Patient coming from: home  Chief Complaint  Patient presents with   Weakness      HPI:    Marisa Bailey  is a 81 y.o. female, with past medical history significant for hypertension, chronic diastolic CHF, COPD, chronic respiratory failure, aspiration pneumonia, tobacco abuse, blindness in the right eye, recent diagnosis of PE, started on Eliquis, (July of this year). -Patient presents to ED secondary to generalized weakness, fatigue,  and lack of energy, denies any chest pain, fever, chills, nausea or vomiting, she continued to feel weak, so her landlord brought her to ED, he does endorse dizziness and lightheadedness as well, hemoglobin came back low at 7.1, upon further questioning she does endorse melena, dark-colored stool for the last week or so, but thought it was secondary to her eating chicken livers, denies any NSAIDs use, she denies any history of endoscopy or colonoscopy in the past. -In ED work-up significant for hemoglobin 7.1, dropped from 12 a month ago, initially blood pressure in the 90s, this has improved with IV fluid, she was Hemoccult positive with melena noted, creatinine up to 1.23, from 0.8, patient was ordered 1 unit PRBC and [facility] hospitalist consulted to admit.    Review of systems:     A full 10 point Review of Systems was done, except as stated above, all other Review of Systems were negative.   With Past History of the following :    Past Medical History:  Diagnosis Date   Aspiration pneumonia ([place]) 11/03/2020   Blind right eye    CHF (congestive heart failure) (HCC)    COPD (chronic obstructive pulmonary disease) ([place])     Diabetes mellitus without complication ([place])    Fx upper humerus-closed 08/17/2012   H/O blood clots    lungs   Hypertension       Past Surgical History:  Procedure Laterality Date   CATARACT EXTRACTION W/PHACO Left 09/24/2021   Procedure: CATARACT EXTRACTION PHACO AND INTRAOCULAR LENS PLACEMENT ([place]);  Surgeon: Baruch Goldmann, MD;  Location: [facility];  Service: Ophthalmology;  Laterality: Left;  CDE: 8.08   ENUCLEATION        Social History:     Social History   Tobacco Use   Smoking status: Every Day    Packs/day: 1.00    Years: 18.00    Total pack years: 18.00    Types: Cigarettes   Smokeless tobacco: Never  Substance Use Topics   Alcohol use: Not Currently    Comment: rarely       Family History :     Family History  Problem Relation Age of Onset  Diabetes Mother       Home Medications:   Prior to Admission medications   Medication Sig Start Date End Date Taking? Authorizing Provider  albuterol (PROVENTIL) (2.5 MG/3ML) 0.083% nebulizer solution Take 3 mLs (2.5 mg total) by nebulization every 6 (six) hours as needed for wheezing or shortness of breath. 03/15/21   Mercy Riding, MD  albuterol (VENTOLIN HFA) 108 (90 Base) MCG/ACT inhaler Inhale 2 puffs into the lungs every 6 (six) hours as needed for wheezing or shortness of breath. 03/15/21   Mercy Riding, MD  amLODipine (NORVASC) 10 MG tablet Take 1 tablet by mouth once daily 09/06/21   [name], MD  atorvastatin (LIPITOR) 20 MG tablet TAKE 1 TABLET BY MOUTH DAILY 05/30/21   [name], MD  Cholecalciferol (VITAMIN D3) 25 MCG (1000 UT) CAPS Take 2 capsules by mouth daily.    [provider]  folic acid (FOLVITE) 1 MG tablet Take 1 tablet by mouth once daily 10/10/21   [name], MD  HYDROcodone-acetaminophen (NORCO/VICODIN) 5-325 MG tablet Take 1 tablet by mouth every 6 (six) hours as needed for severe pain. 08/16/21   Barton Dubois, MD  ILEVRO 0.3 % ophthalmic suspension 1 drop daily. 08/06/21    [provider]  metFORMIN (GLUCOPHAGE) 500 MG tablet Take 1 tablet (500 mg total) by mouth 2 (two) times daily. 09/11/21   [name], MD  moxifloxacin (VIGAMOX) 0.5 % ophthalmic solution Apply 1 drop to eye 3 (three) times daily. 08/06/21   [provider]  nicotine (NICODERM CQ - DOSED IN MG/24 HR) 7 mg/24hr patch Place 1 patch (7 mg total) onto the skin daily. 06/06/21   Chesley Mires, MD  olmesartan (BENICAR) 40 MG tablet TAKE 1 TABLET BY MOUTH DAILY 06/18/21   [name], MD  pantoprazole (PROTONIX) 40 MG tablet Take 1 tablet (40 mg total) by mouth daily. 08/16/21 08/16/22  Barton Dubois, MD  prednisoLONE acetate (PRED FORTE) 1 % ophthalmic suspension 1 drop 3 (three) times daily. 08/06/21   [provider]  rivaroxaban (XARELTO) 20 MG TABS tablet Take 1 tablet (20 mg total) by mouth daily with supper. 09/11/21   [name], MD     Allergies:     Allergies  Allergen Reactions   Penicillins Rash    Has patient had a PCN reaction causing immediate rash, facial/tongue/throat swelling, SOB or lightheadedness with hypotension: {Yes/No:[id number] Has patient had a PCN reaction causing severe rash involving mucus membranes or skin necrosis: Yes Has patient had a PCN reaction that required hospitalization No Has patient had a PCN reaction occurring within the last 10 years: No If all of the above answers are "NO", then may proceed with Cephalosporin use.  **Has tolerated keflex      Physical Exam:   Vitals  Blood pressure (!) 115/36, pulse 71, temperature (!) 97.4 F (36.3 C), temperature source Oral, resp. rate 16, height _0  (1.626 m), weight 66.2 kg, SpO2 100 %.   1. General frail elderly female, laying in bed, no apparent distress  2. Normal affect and insight, Not Suicidal or Homicidal, Awake Alert, Oriented X 3.  3. No F.N deficits, ALL C.Nerves Intact, Strength 5/5 all 4 extremities, Sensation intact all 4 extremities, Plantars down  going.  4.  Right eye blindness, Conjunctivae clear, PERRLA. Moist Oral Mucosa.  5. Supple Neck, No JVD, No cervical lymphadenopathy appriciated, No Carotid Bruits.  6. Symmetrical Chest wall movement, Good air movement bilaterally, CTAB.  7. RRR, No Gallops, Rubs or Murmurs, No Parasternal Heave.  8. Positive Bowel Sounds, Abdomen Soft, No tenderness, No organomegaly appriciated,No rebound -guarding or rigidity.  9.  No Cyanosis, Normal Skin Turgor, No Skin Rash or Bruise.  10. Good muscle tone,  joints appear normal , no effusions, Normal ROM.  11. No Palpable Lymph Nodes in Neck or Axillae    Data Review:    CBC Recent Labs  Lab 10/24/21 1453  WBC 7.8  HGB 7.1*  HCT 24.3*  PLT 261  MCV 87.1  MCH 25.4*  MCHC 29.2*  RDW 20.9*   ------------------------------------------------------------------------------------------------------------------  Chemistries  Recent Labs  Lab 10/24/21 1453  NA 138  K 4.7  CL 104  CO2 25  GLUCOSE 87  BUN 59*  CREATININE 1.23*  CALCIUM 8.9   ------------------------------------------------------------------------------------------------------------------ estimated creatinine clearance is 33.6 mL/min (A) (by C-G formula based on SCr of 1.23 mg/dL (H)). ------------------------------------------------------------------------------------------------------------------ No results for input(s): "TSH", "T4TOTAL", "T3FREE", "THYROIDAB" in the last 72 hours.  Invalid input(s): "FREET3"  Coagulation profile No results for input(s): "INR", "PROTIME" in the last 168 hours. ------------------------------------------------------------------------------------------------------------------- No results for input(s): "DDIMER" in the last 72 hours. -------------------------------------------------------------------------------------------------------------------  Cardiac Enzymes No results for input(s): "CKMB", "TROPONINI", "MYOGLOBIN" in the  last 168 hours.  Invalid input(s): "CK" ------------------------------------------------------------------------------------------------------------------    Component Value Date/Time   BNP 287.0 (H) 03/13/2021 1445     ---------------------------------------------------------------------------------------------------------------  Urinalysis    Component Value Date/Time   COLORURINE YELLOW 08/15/2021 1523   APPEARANCEUR HAZY (A) 08/15/2021 1523   LABSPEC 1.045 (H) 08/15/2021 1523   PHURINE 5.0 08/15/2021 1523   GLUCOSEU NEGATIVE 08/15/2021 1523   HGBUR NEGATIVE 08/15/2021 1523   BILIRUBINUR NEGATIVE 08/15/2021 1523   KETONESUR NEGATIVE 08/15/2021 1523   PROTEINUR 30 (A) 08/15/2021 1523   NITRITE NEGATIVE 08/15/2021 1523   LEUKOCYTESUR NEGATIVE 08/15/2021 1523    ----------------------------------------------------------------------------------------------------------------   Imaging Results:    No results found.  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 after midnight, as likely she will have endoscopy in a.m. as ED physician discussed  with GI on-call -We will transfuse 1 unit PRBC -She had low B12 level in 2019, will recheck level before transfusion  Pulmonary embolism -Recent diagnosis, she is on Eliquis, will hold for now, will resume Eliquis once she clears by GI  Hypertension -Blood pressure initially on the lower side, currently acceptable, will hold home medications including amlodipine, olmesartan, and will resume as blood pressure is elevated, meanwhile we will keep on as needed hydralazine  Chronic hypoxic respiratory failure COPD Pulmonary hypertension -Continue with home oxygen -Continue with as needed albuterol  Prediabetes -She  is on metformin, will hold for now as she will be n.p.o. after midnight  Right eye blindness  - resume home eyedrops regimen after medication reconciliation  Tobacco abuse -She was counseled, currently not interested to discontinue walking  Chronic diastolic heart failure -Most recent echo with grade 2 dysfunction-she is euvolemic at this point  AKI -Baseline creatinine 0.8, it is 1.23 on admission, continue with IV fluids, as well she will receive IV resuscitation with PRBC transfusion. -Losartan remains on hold, please see above discussion   DVT Prophylaxis [facility] remains on hold  AM Labs Ordered, also please review Full Orders  Family Communication: Admission, patients condition and plan of care including tests being ordered have been discussed with the patient  who indicate understanding and agree with the plan and Code Status.  Code Status full  Likely DC to home  Condition GUARDED  Consults called: GI by ED physician  Admission status: Inpatient  Time spent in minutes : 75 minutes   Phillips Climes M.D on 10/24/2021 at 7:15 PM   [facility] - Office  [phone]

## 2021-10-24 NOTE — ED Triage Notes (Signed)
Patient c/o generalized weakness and not feeling well starting today. Denies n/v/d/respiratory sx. States "I just feel sick". Denies pain.

## 2021-10-25 ENCOUNTER — Inpatient Hospital Stay (HOSPITAL_COMMUNITY): Payer: Medicare Other | Admitting: Anesthesiology

## 2021-10-25 ENCOUNTER — Encounter (HOSPITAL_COMMUNITY): Payer: Self-pay | Admitting: Internal Medicine

## 2021-10-25 ENCOUNTER — Encounter (HOSPITAL_COMMUNITY)
Admission: EM | Disposition: A | Discharge status: Home / Self Care | Payer: Self-pay | Source: Home / Self Care | Attending: Family Medicine

## 2021-10-25 DIAGNOSIS — I110 Hypertensive heart disease with heart failure: Secondary | ICD-10-CM

## 2021-10-25 DIAGNOSIS — D509 Iron deficiency anemia, unspecified: Secondary | ICD-10-CM

## 2021-10-25 DIAGNOSIS — I5032 Chronic diastolic (congestive) heart failure: Secondary | ICD-10-CM

## 2021-10-25 DIAGNOSIS — H5440 Blindness, one eye, unspecified eye: Secondary | ICD-10-CM

## 2021-10-25 DIAGNOSIS — K921 Melena: Secondary | ICD-10-CM

## 2021-10-25 DIAGNOSIS — F17210 Nicotine dependence, cigarettes, uncomplicated: Secondary | ICD-10-CM

## 2021-10-25 HISTORY — PX: HOT HEMOSTASIS: SHX5433

## 2021-10-25 HISTORY — PX: HEMOSTASIS CLIP PLACEMENT: SHX6857

## 2021-10-25 HISTORY — PX: SUBMUCOSAL INJECTION: SHX5543

## 2021-10-25 HISTORY — PX: ESOPHAGOGASTRODUODENOSCOPY (EGD) WITH PROPOFOL: SHX5813

## 2021-10-25 LAB — CBC
HCT: 21.2 % — ABNORMAL LOW (ref 36.0–46.0)
Hemoglobin: 6.3 g/dL — CL (ref 12.0–15.0)
MCH: 25.6 pg — ABNORMAL LOW (ref 26.0–34.0)
MCHC: 29.7 g/dL — ABNORMAL LOW (ref 30.0–36.0)
MCV: 86.2 fL (ref 80.0–100.0)
Platelets: 218 10*3/uL (ref 150–400)
RBC: 2.46 MIL/uL — ABNORMAL LOW (ref 3.87–5.11)
RDW: 19.7 % — ABNORMAL HIGH (ref 11.5–15.5)
WBC: 8 10*3/uL (ref 4.0–10.5)
nRBC: 0 % (ref 0.0–0.2)

## 2021-10-25 LAB — BASIC METABOLIC PANEL
Anion gap: 1 — ABNORMAL LOW (ref 5–15)
BUN: 46 mg/dL — ABNORMAL HIGH (ref 8–23)
CO2: 28 mmol/L (ref 22–32)
Calcium: 8.2 mg/dL — ABNORMAL LOW (ref 8.9–10.3)
Chloride: 111 mmol/L (ref 98–111)
Creatinine, Ser: 1.15 mg/dL — ABNORMAL HIGH (ref 0.44–1.00)
GFR, Estimated: 48 mL/min — ABNORMAL LOW (ref >60)
Glucose, Bld: 99 mg/dL (ref 70–99)
Potassium: 4.9 mmol/L (ref 3.5–5.1)
Sodium: 140 mmol/L (ref 135–145)

## 2021-10-25 LAB — PREPARE RBC (CROSSMATCH)

## 2021-10-25 SURGERY — ESOPHAGOGASTRODUODENOSCOPY (EGD) WITH PROPOFOL
Anesthesia: General

## 2021-10-25 MED ORDER — PROPOFOL 500 MG/50ML IV EMUL
INTRAVENOUS | Status: DC | PRN
  Administered 2021-10-25: 150 ug/kg/min via INTRAVENOUS

## 2021-10-25 MED ORDER — SODIUM CHLORIDE 0.9% IV SOLUTION: Freq: Once | INTRAVENOUS | Status: AC

## 2021-10-25 MED ORDER — ORAL CARE MOUTH RINSE: 15.0000 mL | OROMUCOSAL | Status: DC | PRN

## 2021-10-25 MED ORDER — LACTATED RINGERS IV SOLN: INTRAVENOUS | Status: DC

## 2021-10-25 MED ORDER — AMLODIPINE BESYLATE 5 MG PO TABS
10.0000 mg | ORAL_TABLET | Freq: Every day | ORAL | Status: DC
  Administered 2021-10-26 – 2021-10-27 (×2): 10 mg via ORAL
  Filled 2021-10-25 (×2): qty 2

## 2021-10-25 MED ORDER — PREDNISOLONE ACETATE 1 % OP SUSP
1.0000 [drp] | Freq: Two times a day (BID) | OPHTHALMIC | Status: DC
  Administered 2021-10-25 – 2021-10-27 (×5): 1 [drp] via OPHTHALMIC
  Filled 2021-10-25: qty 1

## 2021-10-25 MED ORDER — SODIUM CHLORIDE (PF) 0.9 % IJ SOLN
PREFILLED_SYRINGE | INTRAMUSCULAR | Status: DC | PRN
  Administered 2021-10-25: 8 mL

## 2021-10-25 MED ORDER — NEPAFENAC 0.3 % OP SUSP: 1.0000 [drp] | Freq: Every day | OPHTHALMIC | Status: DC

## 2021-10-25 MED ORDER — SODIUM CHLORIDE 0.9 % IV SOLN: INTRAVENOUS | Status: DC

## 2021-10-25 NOTE — Brief Op Note (Signed)
10/24/2021 - 10/25/2021  2:55 PM  PATIENT:  Marisa Bailey  81 y.o. female  PRE-OPERATIVE DIAGNOSIS:  melena, acute blood loss anemia  POST-OPERATIVE DIAGNOSIS:  hiatal hernia, Dieulafoy Lesion in gastric body, epi injected,  1 clip placed   PROCEDURE:  Procedure(s): ESOPHAGOGASTRODUODENOSCOPY (EGD) WITH PROPOFOL (N/A) HOT HEMOSTASIS (ARGON PLASMA COAGULATION/BICAP) SUBMUCOSAL INJECTION HEMOSTASIS CLIP PLACEMENT  SURGEON:  Surgeon(s) and Role:    * Harvel Quale, MD - Primary  Patient underwent EGD under propofol sedation.  Tolerated the procedure adequately. The examined esophagus was normal. Red blood and some clots were found in the gastric body. A single spot with bleeding was found in the gastric body, possible Dieulafoy lesion.  Coagulation for hemostasis using bipolar probe was successful.  Area was successfully injected with 8 mL of a 1:10,000 solution of epinephrine for hemostasis.  For hemostasis, one hemostatic clip was successfully placed.  There was no bleeding at the end of the procedure. The examined duodenum was normal.   RECOMMENDATIONS - Return patient to hospital ward for ongoing care.  - Clear liquid diet.  - Hold Xarelto for 48 hours. - Check H/H every 12 hours. - Continue pantoprazole 40 mg twice a day.  Maylon Peppers, MD Gastroenterology and Hepatology [facility]

## 2021-10-25 NOTE — Consult Note (Signed)
Gastroenterology Consult   Referring Provider: No ref. provider found Primary Care Physician:  [name], MD Primary Gastroenterologist: Dr. Jenetta Downer (Previously unassigned)  Patient ID: Marisa Bailey; [id number]; 1940-03-13   Admit date: 10/24/2021  LOS: 1 day   Date of Consultation: 10/25/2021  Reason for Consultation: GI bleed    History of Present Illness   COBI Bailey is a 81 y.o. female with past medical history significant for COPD, hypertension, CHF, diabetes, anemia who presented to the emergency department with complaints of acute generalized weakness.  Patient is on Xarelto for history of pulmonary embolus diagnosed in July 2023.  Patient's predominant complaint was of weakness.  Upon further questioning she endorsed having melena for about 1 week, she thought it was related to eating chicken livers.  Patient denies any NSAID or aspirin use.  She is on Xarelto for history of recent PE. She is not sure when her last dose was, usually takes at night, likely took last the evening of 10/23/21. She denies heartburn, vomiting.  She had some vague generalized abdominal pain which she took Tylenol for.  Denies abdominal pain today.  No bowel movement since admission.  She was hospitalized with pulmonary embolus back in July.  Initially placed on Eliquis.  She developed rash and bruising and stopped the medication on her own.  PCP started her on Xarelto on September 11, 2021.  Of note, on her medication list from September 11, 2021 was meloxicam. PCP had her stop medication at time of that visit.  In the ED, initially systolic pressures in the 90s.  Hemoglobin 7.1, down from 12.0 on September 11, 2021.  BUN 59, creatinine 1.23.  She received 1 unit of packed red blood cells last night, hemoglobin this morning 6.3.  Second unit of blood ordered, currently transfusing.     Prior to Admission medications   Medication Sig Start Date End Date Taking? Authorizing Provider  albuterol  (VENTOLIN HFA) 108 (90 Base) MCG/ACT inhaler Inhale 2 puffs into the lungs every 6 (six) hours as needed for wheezing or shortness of breath. 03/15/21  Yes Mercy Riding, MD  amLODipine (NORVASC) 10 MG tablet Take 1 tablet by mouth once daily 09/06/21  Yes Patel, Colin Broach, MD  atorvastatin (LIPITOR) 20 MG tablet TAKE 1 TABLET BY MOUTH DAILY 05/30/21  Yes [name], MD  Cholecalciferol (VITAMIN D3) 25 MCG (1000 UT) CAPS Take 1 capsule by mouth daily.   Yes [provider]  folic acid (FOLVITE) 1 MG tablet Take 1 tablet by mouth once daily 10/10/21  Yes Patel, Colin Broach, MD  ILEVRO 0.3 % ophthalmic suspension 1 drop daily. 08/06/21  Yes [provider]  metFORMIN (GLUCOPHAGE) 500 MG tablet Take 1 tablet (500 mg total) by mouth 2 (two) times daily. 09/11/21  Yes [name], MD  olmesartan (BENICAR) 40 MG tablet TAKE 1 TABLET BY MOUTH DAILY 06/18/21  Yes [name], MD  pantoprazole (PROTONIX) 40 MG tablet Take 1 tablet (40 mg total) by mouth daily. 08/16/21 08/16/22 Yes Barton Dubois, MD  prednisoLONE acetate (PRED FORTE) 1 % ophthalmic suspension 1 drop 2 (two) times daily. 08/06/21  Yes [provider]  rivaroxaban (XARELTO) 20 MG TABS tablet Take 1 tablet (20 mg total) by mouth daily with supper. 09/11/21  Yes [name], MD  albuterol (PROVENTIL) (2.5 MG/3ML) 0.083% nebulizer solution Take 3 mLs (2.5 mg total) by nebulization every 6 (six) hours as needed for wheezing or shortness  of breath. Patient not taking: Reported on 10/24/2021 03/15/21   Mercy Riding, MD  moxifloxacin (VIGAMOX) 0.5 % ophthalmic solution Apply 1 drop to eye 3 (three) times daily. Patient not taking: Reported on 10/24/2021 08/06/21   [provider]    Current Facility-Administered Medications  Medication Dose Route Frequency Provider Last Rate Last Admin   acetaminophen (TYLENOL) tablet 650 mg  650 mg Oral Q6H PRN Elgergawy, Silver Huguenin, MD       Or   acetaminophen (TYLENOL) suppository  650 mg  650 mg Rectal Q6H PRN Elgergawy, Silver Huguenin, MD       albuterol (PROVENTIL) (2.5 MG/3ML) 0.083% nebulizer solution 2.5 mg  2.5 mg Nebulization Q6H PRN Elgergawy, Silver Huguenin, MD       [START ON 10/26/2021] amLODipine (NORVASC) tablet 10 mg  10 mg Oral Daily Johnson, Clanford L, MD       atorvastatin (LIPITOR) tablet 20 mg  20 mg Oral Daily Elgergawy, Silver Huguenin, MD   20 mg at 42/35/36 1443   folic acid (FOLVITE) tablet 1 mg  1 mg Oral Daily Elgergawy, Silver Huguenin, MD   1 mg at 10/24/21 2218   hydrALAZINE (APRESOLINE) injection 5 mg  5 mg Intravenous Q6H PRN Elgergawy, Silver Huguenin, MD       lactated ringers infusion   Intravenous Continuous Elgergawy, Silver Huguenin, MD 50 mL/hr at 10/25/21 0309 Infusion Verify at 10/25/21 0309   nepafenac (ILEVRO) 0.3 % ophthalmic suspension 1 drop  1 drop Right Eye Daily Johnson, Clanford L, MD       Oral care mouth rinse  15 mL Mouth Rinse PRN Elgergawy, Silver Huguenin, MD       [START ON 10/28/2021] pantoprazole (PROTONIX) injection 40 mg  40 mg Intravenous Q12H Elgergawy, Silver Huguenin, MD       pantoprozole (PROTONIX) 80 mg /NS 100 mL infusion  8 mg/hr Intravenous Continuous Elgergawy, Silver Huguenin, MD   Stopped at 10/24/21 2255   prednisoLONE acetate (PRED FORTE) 1 % ophthalmic suspension 1 drop  1 drop Right Eye BID Johnson, Clanford L, MD        Allergies as of 10/24/2021 - Review Complete 10/24/2021  Allergen Reaction Noted   Penicillins Rash 07/07/2012    Past Medical History:  Diagnosis Date   Aspiration pneumonia ([place]) 11/03/2020   Blind right eye    CHF (congestive heart failure) ([facility])    COPD (chronic obstructive pulmonary disease) ([place])    Diabetes mellitus without complication ([place])    Fx upper humerus-closed 08/17/2012   H/O blood clots    lungs   Hypertension     Past Surgical History:  Procedure Laterality Date   CATARACT EXTRACTION W/PHACO Left 09/24/2021   Procedure: CATARACT EXTRACTION PHACO AND INTRAOCULAR LENS PLACEMENT ([place]);  Surgeon: Baruch Goldmann,  MD;  Location: [facility];  Service: Ophthalmology;  Laterality: Left;  CDE: 8.08   ENUCLEATION      Family History  Problem Relation Age of Onset   Diabetes Mother     Social History   Socioeconomic History   Marital status: Single    Spouse name: Not on file   Number of children: Not on file   Years of education: Not on file   Highest education level: Not on file  Occupational History   Not on file  Tobacco Use   Smoking status: Every Day    Packs/day: 1.00    Years: 18.00    Total pack years: 18.00    Types: Cigarettes  Smokeless tobacco: Never  Vaping Use   Vaping Use: Never used  Substance and Sexual Activity   Alcohol use: Not Currently    Comment: rarely   Drug use: No   Sexual activity: Not on file  Other Topics Concern   Not on file  Social History Narrative   Retired (from [employer]). No children, never been married. Has 8 siblings (5 are deceased).   Social Determinants of Health   Financial Resource Strain: Low Risk  (11/30/2020)   Overall Financial Resource Strain (CARDIA)    Difficulty of Paying Living Expenses: Not hard at all  Food Insecurity: No Food Insecurity (10/24/2021)   Hunger Vital Sign    Worried About Running Out of Food in the Last Year: Never true    Ran Out of Food in the Last Year: Never true  Transportation Needs: No Transportation Needs (10/24/2021)   PRAPARE - Hydrologist (Medical): No    Lack of Transportation (Non-Medical): No  Physical Activity: Sufficiently Active (11/30/2020)   Exercise Vital Sign    Days of Exercise per Week: 7 days    Minutes of Exercise per Session: 30 min  Stress: No Stress Concern Present (11/30/2020)   [place]    Feeling of Stress : Not at all  Social Connections: Moderately Isolated (11/30/2020)   Social Connection and Isolation Panel [NHANES]    Frequency of Communication with Friends and Family: More than  three times a week    Frequency of Social Gatherings with Friends and Family: More than three times a week    Attends Religious Services: 1 to 4 times per year    Active Member of [employer] or Organizations: No    Attends Archivist Meetings: Never    Marital Status: Never married  Intimate Partner Violence: Not At Risk (10/24/2021)   Humiliation, Afraid, Rape, and Kick questionnaire    Fear of Current or Ex-Partner: No    Emotionally Abused: No    Physically Abused: No    Sexually Abused: No     Review of System:   General: Negative for anorexia, weight loss, fever, chills, fatigue, positive weakness. Eyes: Negative for vision changes.  ENT: Negative for hoarseness, difficulty swallowing , nasal congestion. CV: Negative for chest pain, angina, palpitations, dyspnea on exertion, peripheral edema.  Respiratory: Negative for dyspnea at rest, dyspnea on exertion, cough, sputum, wheezing.  GI: See history of present illness. GU:  Negative for dysuria, hematuria, urinary incontinence, urinary frequency, nocturnal urination.  MS: Negative for joint pain, low back pain.  Derm: Negative for rash or itching.  Neuro: Negative for weakness, abnormal sensation, seizure, frequent headaches, memory loss, confusion.  Psych: Negative for anxiety, depression, suicidal ideation, hallucinations.  Endo: Negative for unusual weight change.  Heme: Negative for bruising or bleeding. Allergy: Negative for rash or hives.      Physical Examination:   Vital signs in last 24 hours: Temp:  [97.4 F (36.3 C)-98.9 F (37.2 C)] 98.3 F (36.8 C) (09/14 0700) Pulse Rate:  [71-86] 83 (09/14 0700) Resp:  [15-19] 16 (09/14 0700) BP: (90-148)/(36-97) 116/60 (09/14 0700) SpO2:  [95 %-100 %] 99 % (09/14 0700) Weight:  [63.1 kg-66.2 kg] 63.1 kg (09/13 2210) Last BM Date : 10/24/21  General: Well-nourished, well-developed in no acute distress.  Head: Normocephalic, atraumatic.   Eyes: Conjunctiva pink, no  icterus. Mouth: Oropharyngeal mucosa moist and pink , no lesions erythema or exudate. Neck:  Supple without thyromegaly, masses, or lymphadenopathy.  Lungs: Clear to auscultation bilaterally.  Heart: Regular rate and rhythm, no murmurs rubs or gallops.  Abdomen: Bowel sounds are normal, nontender, nondistended, no hepatosplenomegaly or masses, no abdominal bruits or hernia , no rebound or guarding.   Rectal: Not performed Extremities: No lower extremity edema, clubbing, deformity.  Neuro: Alert and oriented x 4 , grossly normal neurologically.  Skin: Warm and dry, no rash or jaundice.   Psych: Alert and cooperative, normal mood and affect.        Intake/Output from previous day: 09/13 0701 - 09/14 0700 In: 639.2 [I.V.:331.2; Blood:308] Out: -  Intake/Output this shift: No intake/output data recorded.  Lab Results:   CBC Recent Labs    10/24/21 1453 10/25/21 0444  WBC 7.8 8.0  HGB 7.1* 6.3*  HCT 24.3* 21.2*  MCV 87.1 86.2  PLT 261 218   BMET Recent Labs    10/24/21 1453 10/25/21 0444  NA 138 140  K 4.7 4.9  CL 104 111  CO2 25 28  GLUCOSE 87 99  BUN 59* 46*  CREATININE 1.23* 1.15*  CALCIUM 8.9 8.2*   LFT No results for input(s): "BILITOT", "BILIDIR", "IBILI", "ALKPHOS", "AST", "ALT", "PROT", "ALBUMIN" in the last 72 hours.  Lipase No results for input(s): "LIPASE" in the last 72 hours.  PT/INR No results for input(s): "LABPROT", "INR" in the last 72 hours.   Hepatitis Panel No results for input(s): "HEPBSAG", "HCVAB", "HEPAIGM", "HEPBIGM" in the last 72 hours.   Imaging Studies:   No results found.Minnie.[place] week]  Assessment:   Pleasant 81 year old female with history of pulmonary embolus in July 2023 on Xarelto, diabetes, COPD, CHF, hypertension presenting with acute onset weakness and melena.  Upper GI bleed: In the setting of potential meloxicam use recently.  Discontinued on August 1.  Suspect upper GI bleed due to peptic ulcer disease in the setting of  anticoagulation.  Hemoglobin last month was normal.  Dropped down to 6.3 today after 1 unit of packed red blood cells.  Receiving second unit today.  No BM since admission.  It is unclear when she received her last dose of Xarelto, she states that she takes them in the evenings but she is not sure when she last took it.  She presented to our ED yesterday early afternoon.  Plan:   Upper endoscopy with Dr. Jenetta Downer, likely today.  I have discussed the risks, alternatives, benefits with regards to but not limited to the risk of reaction to medication, bleeding, infection, perforation and the patient is agreeable to proceed. Written consent to be obtained. Continue to monitor H&H closely, transfuse as needed. Continue pantoprazole infusion.     LOS: 1 day   We would like to thank you for the opportunity to participate in the care of NIAJA STICKLEY.  Laureen Ochs. Bernarda Caffey [facility] [phone] 9/14/20237:52 AM

## 2021-10-25 NOTE — Op Note (Signed)
[facility] Patient Name: Marisa Bailey Procedure Date: 10/25/2021 2:22 PM MRN: [id number] Date of Birth: Apr 18, 1940 Attending MD: Maylon Peppers ,  CSN: [id number] Age: 81 Admit Type: Outpatient Procedure:                Upper GI endoscopy Indications:              Iron deficiency anemia, Melena Providers:                Maylon Peppers, Lambert Mody, Everardo Pacific Referring MD:              Medicines:                Monitored Anesthesia Care Complications:            No immediate complications. Estimated Blood Loss:     Estimated blood loss: none. Procedure:                Pre-Anesthesia Assessment:                           - Prior to the procedure, a History and Physical                            was performed, and patient medications, allergies                            and sensitivities were reviewed. The patient's                            tolerance of previous anesthesia was reviewed.                           - The risks and benefits of the procedure and the                            sedation options and risks were discussed with the                            patient. All questions were answered and informed                            consent was obtained.                           - ASA Grade Assessment: III - A patient with severe                            systemic disease.                           After obtaining informed consent, the endoscope was                            passed under direct vision. Throughout the  procedure, the patient's blood pressure, pulse, and                            oxygen saturations were monitored continuously. The                            GIF-H190 ([id number]) scope was introduced through the                            mouth, and advanced to the third part of duodenum.                            The upper GI endoscopy was accomplished without                             difficulty. The patient tolerated the procedure                            well. Scope In: 2:32:46 PM Scope Out: 2:51:19 PM Total Procedure Duration: 0 hours 18 minutes 33 seconds  Findings:      The examined esophagus was normal.      Red blood and some clots were found in the gastric body.      A single spot with bleeding was found in the gastric body, possible       Dieulafoy lesion. Coagulation for hemostasis using bipolar probe was       successful. Area was successfully injected with 8 mL of a 1:10,000       solution of epinephrine for hemostasis. For hemostasis, one hemostatic       clip was successfully placed. There was no bleeding at the end of the       procedure.      The examined duodenum was normal. Impression:               - Normal esophagus.                           - Red blood in the gastric body.                           - A single spot with bleeding in the stomach,                            possible Dieulafoy lesion. Treated with bipolar                            cautery. Injected. Clip was placed.                           - Normal examined duodenum.                           - No specimens collected. Moderate Sedation:      Per Anesthesia Care Recommendation:           - Return patient to hospital ward for ongoing care.                           -  Clear liquid diet.                           - Hold Xarelto for 48 hours.                           - Check H/H every 12 hours.                           - Continue pantoprazole 40 mg twice a day. Procedure Code(s):        --- Professional ---                           [phone], Esophagogastroduodenoscopy, flexible,                            transoral; with control of bleeding, any method Diagnosis Code(s):        --- Professional ---                           K92.2, Gastrointestinal hemorrhage, unspecified                           D50.9, Iron deficiency anemia, unspecified                           K92.1,  Melena (includes Hematochezia) CPT copyright 2019 American Medical Association. All rights reserved. The codes documented in this report are preliminary and upon coder review may  be revised to meet current compliance requirements. Maylon Peppers, MD Maylon Peppers,  10/25/2021 3:05:53 PM This report has been signed electronically. Number of Addenda: 0

## 2021-10-25 NOTE — TOC Progression Note (Signed)
  Transition of Care [facility]) Screening Note   Patient Details  Name: Marisa Bailey Date of Birth: 17-Feb-1940   Transition of Care [facility]) CM/SW Contact:    Shade Flood, LCSW Phone Number: 10/25/2021, 12:51 PM    Transition of Care Department [facility]) has reviewed patient and no TOC needs have been identified at this time. We will continue to monitor patient advancement through interdisciplinary progression rounds. If new patient transition needs arise, please place a TOC consult.

## 2021-10-25 NOTE — Transfer of Care (Signed)
Immediate Anesthesia Transfer of Care Note  Patient: CHAUNTEL WINDSOR  Procedure(s) Performed: ESOPHAGOGASTRODUODENOSCOPY (EGD) WITH PROPOFOL HOT HEMOSTASIS (ARGON PLASMA COAGULATION/BICAP) SUBMUCOSAL INJECTION HEMOSTASIS CLIP PLACEMENT  Patient Location: PACU  Anesthesia Type:General  Level of Consciousness: sedated, patient cooperative and responds to stimulation  Airway & Oxygen Therapy: Patient Spontanous Breathing and Patient connected to nasal cannula oxygen  Post-op Assessment: Report given to RN, Post -op Vital signs reviewed and stable and Patient moving all extremities  Post vital signs: Reviewed and stable  Last Vitals:  Vitals Value Taken Time  BP 102/71 10/25/21 1502  Temp 36.6 C 10/25/21 1502  Pulse 86 10/25/21 1506  Resp 25 10/25/21 1506  SpO2 100 % 10/25/21 1506  Vitals shown include unvalidated device data.  Last Pain:  Vitals:   10/25/21 1502  TempSrc:   PainSc: Asleep      Patients Stated Pain Goal: 6 (50/72/25 7505)  Complications: No notable events documented.

## 2021-10-25 NOTE — Progress Notes (Signed)
PROGRESS NOTE   Marisa Bailey  WCB:[id number] DOB: 05-Jan-1941 DOA: 10/24/2021 PCP: Marisa Spar, MD   Chief Complaint  Patient presents with   Weakness   Level of care: Med-Surg  Brief Admission History:   81 y.o. female, with past medical history significant for hypertension, chronic diastolic CHF, COPD, chronic respiratory failure, aspiration pneumonia, tobacco abuse, blindness in the right eye, recent diagnosis of PE, started on Eliquis, (July of this year).  Patient presents to ED secondary to generalized weakness, fatigue,  and lack of energy, denies any chest pain, fever, chills, nausea or vomiting, she continued to feel weak, so her landlord brought her to ED, he does endorse dizziness and lightheadedness as well, hemoglobin came back low at 7.1, upon further questioning she does endorse melena, dark-colored stool for the last week or so, but thought it was secondary to her eating chicken livers, denies any NSAIDs use, she denies any history of endoscopy or colonoscopy in the past. -In ED work-up significant for hemoglobin 7.1, dropped from 12 a month ago, initially blood pressure in the 90s, this has improved with IV fluid, she was Hemoccult positive with melena noted, creatinine up to 1.23, from 0.8, patient was ordered 1 unit PRBC and [facility] hospitalist consulted to admit.   Assessment and Plan:  Presumed upper GI bleed Symptomatic anemia  - Pt transfused 1 unit PRBC today - GI team planning EGD later today - recheck CBC in AM  - continue IV pantoprazole  History of Pulmonary Embolus - temporarily holding home rivaroxaban  Essential hypertension  - temporarily holding home BP meds while having soft BPs  Chronic hypoxic respiratory failure - stable on home oxygen requirement  Prediabetes mellitus - holding home metformin  Right eye blindness - resume home eye drop medications  Tobacco - counseled on complete tobacco cessation  HFpEF - stable, no s/s of heart  failure   AKI - likely prerenal and treated with gentle IV fluid hydration  - temporarily holding home losartan   DVT prophylaxis: SCDs added  Code Status: Full  Family Communication:  Disposition: Status is: Inpatient Remains inpatient appropriate because: intensity    Consultants:  GI service  Procedures:  EGD 10/25/21>> Antimicrobials:    Subjective: Pt reports feeling better but hungry.   Objective: Vitals:   10/25/21 0549 10/25/21 0636 10/25/21 0700 10/25/21 0952  BP: (!) 120/52 (!) 124/54 116/60 119/65  Pulse: 81 76 83 79  Resp: _0 Temp: 98.9 F (37.2 C) 98.7 F (37.1 C) 98.3 F (36.8 C) 98.7 F (37.1 C)  TempSrc:  Oral Oral Oral  SpO2: 99% 100% 99% 99%  Weight:      Height:        Intake/Output Summary (Last 24 hours) at 10/25/2021 1053 Last data filed at 10/25/2021 0944 Gross per 24 hour  Intake 997.2 ml  Output --  Net 997.2 ml   Filed Weights   10/24/21 1412 10/24/21 2210  Weight: 66.2 kg 63.1 kg   Examination:  General exam: Appears calm and comfortable  Respiratory system: Clear to auscultation. Respiratory effort normal. Cardiovascular system: normal S1 & S2 heard. No JVD, murmurs, rubs, gallops or clicks. No pedal edema. Gastrointestinal system: Abdomen is nondistended, soft and nontender. No organomegaly or masses felt. Normal bowel sounds heard. Central nervous system: Alert and oriented. No focal neurological deficits. Extremities: Symmetric 5 x 5 power. Skin: No rashes, lesions or ulcers. Psychiatry: Judgement and insight appear normal. Mood & affect appropriate.  Data Reviewed: I have personally reviewed following labs and imaging studies  CBC: Recent Labs  Lab 10/24/21 1453 10/25/21 0444  WBC 7.8 8.0  HGB 7.1* 6.3*  HCT 24.3* 21.2*  MCV 87.1 86.2  PLT 261 388    Basic Metabolic Panel: Recent Labs  Lab 10/24/21 1453 10/25/21 0444  NA 138 140  K 4.7 4.9  CL 104 111  CO2 25 28  GLUCOSE 87 99  BUN 59* 46*   CREATININE 1.23* 1.15*  CALCIUM 8.9 8.2*    CBG: No results for input(s): "GLUCAP" in the last 168 hours.  No results found for this or any previous visit (from the past 240 hour(s)).   Radiology Studies: No results found.  Scheduled Meds:  [START ON 10/26/2021] amLODipine  10 mg Oral Daily   atorvastatin  20 mg Oral Daily   folic acid  1 mg Oral Daily   nepafenac  1 drop Right Eye Daily   [START ON 10/28/2021] pantoprazole  40 mg Intravenous Q12H   prednisoLONE acetate  1 drop Right Eye BID   Continuous Infusions:  lactated ringers 50 mL/hr at 10/25/21 0959   pantoprazole 8 mg/hr (10/25/21 1000)     LOS: 1 day   Time spent: 36 mins  Marisa Sonnier Wynetta Emery, MD How to contact the [facility] Attending or Consulting provider [place] or covering provider during after hours [place], for this patient?  Check the care team in [facility] and look for a) attending/consulting TR[facility] provider listed and b) the [facility] team listed Log into [email or web address] and use [place]'s universal password to access. If you do not have the password, please contact the hospital operator. Locate the [facility] provider you are looking for under [facility] and page to a number that you can be directly reached. If you still have difficulty reaching the provider, please page the [facility] (Director on Call) for the Hospitalists listed on amion for assistance.  10/25/2021, 10:53 AM

## 2021-10-25 NOTE — Anesthesia Procedure Notes (Signed)
Procedure Name: MAC Date/Time: 10/25/2021 2:36 PM  Performed by: Lieutenant Diego, CRNAPre-anesthesia Checklist: Patient identified, Emergency Drugs available, Suction available, Patient being monitored and Timeout performed Patient Re-evaluated:Patient Re-evaluated prior to induction Oxygen Delivery Method: Nasal cannula Induction Type: IV induction

## 2021-10-25 NOTE — Progress Notes (Signed)
Hemoglobin 6.3, MD made aware.

## 2021-10-25 NOTE — Anesthesia Preprocedure Evaluation (Addendum)
Anesthesia Evaluation  Patient identified by MRN, date of birth, ID band Patient awake    Reviewed: Allergy & Precautions, NPO status , Patient's Chart, lab work & pertinent test results  Airway Mallampati: III  TM Distance: >3 FB Neck ROM: Full    Dental  (+) Upper Dentures, Lower Dentures   Pulmonary pneumonia (aspiration), COPD,  COPD inhaler, Current Smoker, PE   Pulmonary exam normal breath sounds clear to auscultation       Cardiovascular hypertension, Pt. on medications +CHF and + DVT   Rhythm:Irregular Rate:Normal  1. Left ventricular ejection fraction, by estimation, is 55 to 60%. The left ventricle has normal function. The left ventricle has no regional wall motion abnormalities. Left ventricular diastolic parameters are  consistent with Grade II diastolic  dysfunction (pseudonormalization).  2. Right ventricular systolic function is mildly reduced. The right ventricular size is mildly enlarged. There is moderately elevated pulmonary artery systolic pressure. The estimated right ventricular  systolic pressure is 94.1 mmHg.  3. Left atrial size was moderately dilated.  4. Right atrial size was moderately dilated.  5. The mitral valve is grossly normal. Trivial mitral valve  regurgitation.  6. The aortic valve is tricuspid. Aortic valve regurgitation is not visualized. Aortic valve sclerosis is present, with no evidence of aortic valve stenosis.  7. The inferior vena cava is dilated in size with <50% respiratory variability, suggesting right atrial pressure of 15 mmHg.    Neuro/Psych negative neurological ROS  negative psych ROS   GI/Hepatic negative GI ROS, Neg liver ROS,   Endo/Other  diabetes, Well Controlled, Type 2, Oral Hypoglycemic Agents  Renal/GU negative Renal ROS  negative genitourinary   Musculoskeletal negative musculoskeletal ROS (+)   Abdominal   Peds negative pediatric ROS (+)   Hematology  (+) Blood dyscrasia, anemia ,   Anesthesia Other Findings   Reproductive/Obstetrics negative OB ROS                            Anesthesia Physical Anesthesia Plan  ASA: 3  Anesthesia Plan: General   Post-op Pain Management: Minimal or no pain anticipated   Induction: Intravenous  PONV Risk Score and Plan:   Airway Management Planned:   Additional Equipment:   Intra-op Plan:   Post-operative Plan: Possible Post-op intubation/ventilation  Informed Consent: I have reviewed the patients History and Physical, chart, labs and discussed the procedure including the risks, benefits and alternatives for the proposed anesthesia with the patient or authorized representative who has indicated his/her understanding and acceptance.     Dental advisory given  Plan Discussed with: CRNA and Surgeon  Anesthesia Plan Comments:         Anesthesia Quick Evaluation

## 2021-10-25 NOTE — Anesthesia Postprocedure Evaluation (Signed)
Anesthesia Post Note  Patient: Marisa Bailey  Procedure(s) Performed: ESOPHAGOGASTRODUODENOSCOPY (EGD) WITH PROPOFOL HOT HEMOSTASIS (ARGON PLASMA COAGULATION/BICAP) SUBMUCOSAL INJECTION HEMOSTASIS CLIP PLACEMENT  Patient location during evaluation: PACU Anesthesia Type: General Level of consciousness: awake and alert and oriented Pain management: pain level controlled Vital Signs Assessment: post-procedure vital signs reviewed and stable Respiratory status: spontaneous breathing, nonlabored ventilation and respiratory function stable Cardiovascular status: blood pressure returned to baseline and stable Postop Assessment: no apparent nausea or vomiting Anesthetic complications: no   No notable events documented.   Last Vitals:  Vitals:   10/25/21 1509 10/25/21 1544  BP: (!) 94/59 (!) 128/53  Pulse: 77 64  Resp: 19 16  Temp:  36.8 C  SpO2: 100% 98%    Last Pain:  Vitals:   10/25/21 1544  TempSrc: Oral  PainSc:                  [name]

## 2021-10-26 DIAGNOSIS — Z79899 Other long term (current) drug therapy: Secondary | ICD-10-CM

## 2021-10-26 LAB — BASIC METABOLIC PANEL
Anion gap: 5 (ref 5–15)
BUN: 23 mg/dL (ref 8–23)
CO2: 28 mmol/L (ref 22–32)
Calcium: 8.3 mg/dL — ABNORMAL LOW (ref 8.9–10.3)
Chloride: 108 mmol/L (ref 98–111)
Creatinine, Ser: 0.83 mg/dL (ref 0.44–1.00)
GFR, Estimated: >60 mL/min (ref >60)
Glucose, Bld: 98 mg/dL (ref 70–99)
Potassium: 4.2 mmol/L (ref 3.5–5.1)
Sodium: 141 mmol/L (ref 135–145)

## 2021-10-26 LAB — BPAM RBC
Blood Product Expiration Date: 202309242359
Blood Product Expiration Date: 202310122359
ISSUE DATE / TIME: 202309131919
ISSUE DATE / TIME: 202309140640
Unit Type and Rh: 1700
Unit Type and Rh: 9500

## 2021-10-26 LAB — TYPE AND SCREEN
ABO/RH(D): B POS
Antibody Screen: NEGATIVE
Unit division: 0
Unit division: 0

## 2021-10-26 LAB — CBC
HCT: 25.6 % — ABNORMAL LOW (ref 36.0–46.0)
Hemoglobin: 8 g/dL — ABNORMAL LOW (ref 12.0–15.0)
MCH: 26.5 pg (ref 26.0–34.0)
MCHC: 31.3 g/dL (ref 30.0–36.0)
MCV: 84.8 fL (ref 80.0–100.0)
Platelets: 230 10*3/uL (ref 150–400)
RBC: 3.02 MIL/uL — ABNORMAL LOW (ref 3.87–5.11)
RDW: 19 % — ABNORMAL HIGH (ref 11.5–15.5)
WBC: 11.7 10*3/uL — ABNORMAL HIGH (ref 4.0–10.5)
nRBC: 0 % (ref 0.0–0.2)

## 2021-10-26 MED ORDER — PANTOPRAZOLE SODIUM 40 MG IV SOLR
40.0000 mg | Freq: Two times a day (BID) | INTRAVENOUS | Status: DC
  Administered 2021-10-26 – 2021-10-27 (×2): 40 mg via INTRAVENOUS
  Filled 2021-10-26 (×2): qty 10

## 2021-10-26 NOTE — Care Management Important Message (Signed)
Important Message  Patient Details  Name: Marisa Bailey MRN: [id number] Date of Birth: May 06, 1940   Medicare Important Message Given:  Yes     [name] 10/26/2021, 11:46 AM

## 2021-10-26 NOTE — Progress Notes (Signed)
Gastroenterology Progress Note   Referring Provider: No ref. provider found Primary Care Physician:  [name], MD Primary Gastroenterologist:  Dr. Jenetta Downer (previously unassigned)  Patient ID: Marisa Bailey; [id number]; January 26, 1941    Subjective   Patient doing well without any complaints. Denies abdominal pain, nausea, vomiting. Reports she did have some dark stools but does not believe she had any since after procedure. Tolerated liquid diet this morning and cereal after that. Has good appetite and was ready for her soft diet lunch tray. Denies weakness. Ambulated to bedside commode during exam with minimal assistance.    Objective   Vital signs in last 24 hours Temp:  [97.8 F (36.6 C)-98.8 F (37.1 C)] 98.8 F (37.1 C) (09/15 0500) Pulse Rate:  [64-92] 77 (09/15 0500) Resp:  [16-20] 16 (09/15 0500) BP: (94-131)/(43-77) 126/57 (09/15 0500) SpO2:  [92 %-100 %] 99 % (09/15 0500) Weight:  [63.1 kg] 63.1 kg (09/14 1332) Last BM Date : 10/26/21  Physical Exam General:   Alert and oriented, pleasant Head:  Normocephalic and atraumatic. Eyes:  No icterus, sclera clear. Conjuctiva pink.  Mouth:  Without lesions, mucosa pink and moist.  Abdomen:  Bowel sounds present, soft, non-tender, non-distended. No HSM or hernias noted. No rebound or guarding. No masses appreciated  Extremities:  Without clubbing or edema. Neurologic:  Alert and  oriented x4;  grossly normal neurologically.  Psych:  Alert and cooperative. Normal mood and affect.  Intake/Output from previous day: 09/14 0701 - 09/15 0700 In: 1268.8 [P.O.:240; I.V.:670.8; Blood:358] Out: -  Intake/Output this shift: Total I/O In: 240 [P.O.:240] Out: -   Lab Results  Recent Labs    10/24/21 1453 10/25/21 0444 10/25/21 1218 10/26/21 0600  WBC 7.8 8.0  --  11.7*  HGB 7.1* 6.3* 8.0* 8.0*  HCT 24.3* 21.2* 25.0* 25.6*  PLT 261 218  --  230   BMET Recent Labs    10/24/21 1453 10/25/21 0444 10/26/21 0600   NA 138 140 141  K 4.7 4.9 4.2  CL 104 111 108  CO2 _0 GLUCOSE 87 99 98  BUN 59* 46* 23  CREATININE 1.23* 1.15* 0.83  CALCIUM 8.9 8.2* 8.3*   LFT No results for input(s): "PROT", "ALBUMIN", "AST", "ALT", "ALKPHOS", "BILITOT", "BILIDIR", "IBILI" in the last 72 hours. PT/INR No results for input(s): "LABPROT", "INR" in the last 72 hours. Hepatitis Panel No results for input(s): "HEPBSAG", "HCVAB", "HEPAIGM", "HEPBIGM" in the last 72 hours.   Studies/Results No results found.  Assessment  81 y.o. female with a history of COPD, diabetes, CHF, HTN, anemia, and PE in July currently on Xarelto who presented to the hospital with acute onset weakness and melena.  Upper GI bleed: Initially presumed to be peptic ulcer disease in the setting of potential meloxicam use that was discontinued on 09/11/2021 as well as anticoagulation.  Hemoglobin 12 point 09/11/2021.  Hemoglobin on admission was 7.1 with drop to 6.3 after 1 unit of blood.  She received an additional unit there after.  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  Normal duodenum.  Hemoglobin remained stable today at 8.  Patient remained on PPI infusion and given clear liquid diet and recommendation to hold Xarelto for 48 hours. No reports of melanotic stool and denies any abdominal pain. Good appetite.  Plan / Recommendations  Stop PPI infusion begin IV PPI twice daily Continue soft diet Continue to monitor H&H, transfuse for hemoglobin less than 7 May restart Xarelto after 10/27/2021 If hgb remains stable may she may be able to discharge from a GI perspective in the next 24-48 hours.  Will need CBC in 1 week post discharge and hospital follow up in 3-4 weeks.      LOS: 2 days    10/26/2021, 10:52 AM   Venetia Night, MSN, FNP-BC,  AGACNP-BC [facility]

## 2021-10-26 NOTE — Progress Notes (Signed)
PROGRESS NOTE   Marisa Bailey  OMV:[id number] DOB: 1940/04/16 DOA: 10/24/2021 PCP: [name], MD   Chief Complaint  Patient presents with   Weakness   Level of care: Med-Surg  Brief Admission History:   81 y.o. female, with past medical history significant for hypertension, chronic diastolic CHF, COPD, chronic respiratory failure, aspiration pneumonia, tobacco abuse, blindness in the right eye, recent diagnosis of PE, started on Eliquis, (July of this year).  Patient presents to ED secondary to generalized weakness, fatigue,  and lack of energy, denies any chest pain, fever, chills, nausea or vomiting, she continued to feel weak, so her landlord brought her to ED, he does endorse dizziness and lightheadedness as well, hemoglobin came back low at 7.1, upon further questioning she does endorse melena, dark-colored stool for the last week or so, but thought it was secondary to her eating chicken livers, denies any NSAIDs use, she denies any history of endoscopy or colonoscopy in the past. -In ED work-up significant for hemoglobin 7.1, dropped from 12 a month ago, initially blood pressure in the 90s, this has improved with IV fluid, she was Hemoccult positive with melena noted, creatinine up to 1.23, from 0.8, patient was ordered 1 unit PRBC and [facility] hospitalist consulted to admit.   Assessment and Plan:  Upper GI bleed Symptomatic anemia  - Pt transfused 1 unit PRBC and Hg up to 8 - EGD findings of red blood and clots in gastric body, dieulafoy lesion cauterized, clipped, epinephrine injected - recheck CBC in AM  - continue IV pantoprazole BID, advanced to soft diet  - can restart rivaroxaban after 9/16 per GI service  - likely can discharge in next 24-48 hours per GI   History of Pulmonary Embolus - temporarily holding home rivaroxaban  Essential hypertension  - temporarily holding home BP meds while having soft BPs  Chronic hypoxic respiratory failure - stable on home oxygen  requirement  Prediabetes mellitus - holding home metformin  Right eye blindness - resume home eye drop medications  Tobacco - counseled on complete tobacco cessation  HFpEF - stable, no s/s of acute heart failure   AKI - likely prerenal and treated with gentle IV fluid hydration  - temporarily holding home losartan   DVT prophylaxis: SCDs added  Code Status: Full  Family Communication:  Disposition: Status is: Inpatient Remains inpatient appropriate because: intensity    Consultants:  GI service  Procedures:  EGD 10/25/21>> Antimicrobials:    Subjective: Pt tolerating clear liquids wanting to advance diet today.  No black stool seen.   Objective: Vitals:   10/25/21 2107 10/26/21 0500 10/26/21 1100 10/26/21 1247  BP: 131/77 (!) 126/57 (!) 138/38 (!) 97/53  Pulse: 75 77 86   Resp:  16 18   Temp: 98.6 F (37 C) 98.8 F (37.1 C) 99.2 F (37.3 C)   TempSrc: Oral Oral Oral   SpO2: 98% 99% 99%   Weight:      Height:        Intake/Output Summary (Last 24 hours) at 10/26/2021 1255 Last data filed at 10/26/2021 0900 Gross per 24 hour  Intake 1150.82 ml  Output --  Net 1150.82 ml   Filed Weights   10/24/21 1412 10/24/21 2210 10/25/21 1332  Weight: 66.2 kg 63.1 kg 63.1 kg   Examination:  General exam: Appears calm and comfortable  Respiratory system: Clear to auscultation. Respiratory effort normal. Cardiovascular system: normal S1 & S2 heard. No JVD, murmurs, rubs, gallops or clicks. No pedal  edema. Gastrointestinal system: Abdomen is nondistended, soft and nontender. No organomegaly or masses felt. Normal bowel sounds heard. Central nervous system: Alert and oriented. No focal neurological deficits. Extremities: Symmetric 5 x 5 power. Skin: No rashes, lesions or ulcers. Psychiatry: Judgement and insight appear normal. Mood & affect appropriate.   Data Reviewed: I have personally reviewed following labs and imaging studies  CBC: Recent Labs  Lab  10/24/21 1453 10/25/21 0444 10/25/21 1218 10/26/21 0600  WBC 7.8 8.0  --  11.7*  HGB 7.1* 6.3* 8.0* 8.0*  HCT 24.3* 21.2* 25.0* 25.6*  MCV 87.1 86.2  --  84.8  PLT 261 218  --  984    Basic Metabolic Panel: Recent Labs  Lab 10/24/21 1453 10/25/21 0444 10/26/21 0600  NA 138 140 141  K 4.7 4.9 4.2  CL 104 111 108  CO2 _0 GLUCOSE 87 99 98  BUN 59* 46* 23  CREATININE 1.23* 1.15* 0.83  CALCIUM 8.9 8.2* 8.3*    CBG: No results for input(s): "GLUCAP" in the last 168 hours.  No results found for this or any previous visit (from the past 240 hour(s)).   Radiology Studies: No results found.  Scheduled Meds:  amLODipine  10 mg Oral Daily   atorvastatin  20 mg Oral Daily   folic acid  1 mg Oral Daily   nepafenac  1 drop Right Eye Daily   pantoprazole  40 mg Intravenous Q12H   prednisoLONE acetate  1 drop Right Eye BID   Continuous Infusions:  lactated ringers 50 mL/hr at 10/25/21 1426     LOS: 2 days   Time spent: 35 mins  Kendalynn Wideman Wynetta Emery, MD How to contact the [facility] Attending or Consulting provider [place] or covering provider during after hours [place], for this patient?  Check the care team in [facility] and look for a) attending/consulting [facility] provider listed and b) the [facility] team listed Log into [email or web address] and use [place]'s universal password to access. If you do not have the password, please contact the hospital operator. Locate the [facility] provider you are looking for under [facility] and page to a number that you can be directly reached. If you still have difficulty reaching the provider, please page the [facility] (Director on Call) for the Hospitalists listed on amion for assistance.  10/26/2021, 12:55 PM

## 2021-10-27 ENCOUNTER — Telehealth: Payer: Self-pay | Admitting: Internal Medicine

## 2021-10-27 DIAGNOSIS — D649 Anemia, unspecified: Secondary | ICD-10-CM

## 2021-10-27 DIAGNOSIS — I10 Essential (primary) hypertension: Secondary | ICD-10-CM

## 2021-10-27 DIAGNOSIS — J9611 Chronic respiratory failure with hypoxia: Secondary | ICD-10-CM

## 2021-10-27 LAB — CBC
HCT: 29.7 % — ABNORMAL LOW (ref 36.0–46.0)
Hemoglobin: 8.9 g/dL — ABNORMAL LOW (ref 12.0–15.0)
MCH: 26.2 pg (ref 26.0–34.0)
MCHC: 30 g/dL (ref 30.0–36.0)
MCV: 87.4 fL (ref 80.0–100.0)
Platelets: 247 10*3/uL (ref 150–400)
RBC: 3.4 MIL/uL — ABNORMAL LOW (ref 3.87–5.11)
RDW: 18.8 % — ABNORMAL HIGH (ref 11.5–15.5)
WBC: 10.1 10*3/uL (ref 4.0–10.5)
nRBC: 0 % (ref 0.0–0.2)

## 2021-10-27 MED ORDER — PANTOPRAZOLE SODIUM 40 MG PO TBEC: 40.0000 mg | DELAYED_RELEASE_TABLET | Freq: Two times a day (BID) | ORAL | 1 refills | Status: DC

## 2021-10-27 MED ORDER — RIVAROXABAN 20 MG PO TABS: 20.0000 mg | ORAL_TABLET | Freq: Every day | ORAL | 5 refills | Status: DC

## 2021-10-27 NOTE — Telephone Encounter (Signed)
Please arrange hospital follow-up for this patient.  Any of the apps in our entire practice would be fine.  Thank you

## 2021-10-27 NOTE — Discharge Summary (Addendum)
Physician Discharge Summary  Marisa Bailey ASN:[id number] DOB: 1941-01-06 DOA: 10/24/2021  PCP: [name], MD  Admit date: 10/24/2021 Discharge date: 10/27/2021  Admitted From:  Home  Disposition: Home   Recommendations for Outpatient Follow-up:  Follow up with PCP in 1 weeks Please recheck BP and discuss if can restart olmesartan  Discharge Condition: STABLE   CODE STATUS: FULL DIET: resume prior home diet    Brief Hospitalization Summary: Please see all hospital notes, images, labs for full details of the hospitalization. Brief Admission History:   81 y.o. female, with past medical history significant for hypertension, chronic diastolic CHF, COPD, chronic respiratory failure, aspiration pneumonia, tobacco abuse, blindness in the right eye, recent diagnosis of PE, started on Eliquis, (July of this year).  Patient presents to ED secondary to generalized weakness, fatigue,  and lack of energy, denies any chest pain, fever, chills, nausea or vomiting, she continued to feel weak, so her landlord brought her to ED, he does endorse dizziness and lightheadedness as well, hemoglobin came back low at 7.1, upon further questioning she does endorse melena, dark-colored stool for the last week or so, but thought it was secondary to her eating chicken livers, denies any NSAIDs use, she denies any history of endoscopy or colonoscopy in the past. -In ED work-up significant for hemoglobin 7.1, dropped from 12 a month ago, initially blood pressure in the 90s, this has improved with IV fluid, she was Hemoccult positive with melena noted, creatinine up to 1.23, from 0.8, patient was ordered 1 unit PRBC and [facility] hospitalist consulted to admit.  Hospital Course by problem   Upper GI bleed Symptomatic anemia  - Pt transfused 1 unit PRBC and Hg up to 8 - EGD findings of red blood and clots in gastric body, dieulafoy lesion cauterized, clipped, epinephrine injected - rechecked CBC Hg improved to 8.9  - Pt  was treated with IV pantoprazole BID, advanced to soft diet  - can restart rivaroxaban after 9/16 per GI service on 9/17 - with stable Hg will DC home, outpatient follow up with GI - continue home protonix   History of Pulmonary Embolus - temporarily holding home rivaroxaban, to restart on 9/17 per GI team   Essential hypertension  - soft BPs, restarted amlodipine but holding ARB for now, recheck BP outpatient    Chronic hypoxic respiratory failure - stable on home oxygen requirement   Prediabetes mellitus - holding home metformin in hospital, resume at discharge   Right eye blindness - resume home eye drop medications   Tobacco - counseled on complete tobacco cessation   HFpEF - stable, no s/s of acute heart failure    AKI - treated and resolved - likely prerenal and treated and resolved with gentle IV fluid hydration  - temporarily holding home losartan due to soft BPs   Discharge Diagnoses:  Principal Problem:   GI bleed Active Problems:   COPD (chronic obstructive pulmonary disease) ([place])   Essential hypertension   Chronic respiratory failure with hypoxia ([facility])   (HFpEF) heart failure with preserved ejection fraction ([place])   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[place])   Blindness of right eye   Pulmonary emboli ([facility])   Symptomatic anemia   Discharge Instructions:  Allergies as of 10/27/2021       Reactions   Penicillins Rash   Has patient had a PCN reaction causing immediate rash, facial/tongue/throat swelling, SOB or lightheadedness with hypotension: {Yes/No:[id number] Has patient had a PCN reaction causing severe rash involving mucus  membranes or skin necrosis: Yes Has patient had a PCN reaction that required hospitalization No Has patient had a PCN reaction occurring within the last 10 years: No If all of the above answers are "NO", then may proceed with Cephalosporin use. **Has tolerated keflex        Medication List     STOP taking these medications     olmesartan 40 MG tablet Commonly known as: BENICAR       TAKE these medications    albuterol 108 (90 Base) MCG/ACT inhaler Commonly known as: VENTOLIN HFA Inhale 2 puffs into the lungs every 6 (six) hours as needed for wheezing or shortness of breath. What changed: Another medication with the same name was removed. Continue taking this medication, and follow the directions you see here.   amLODipine 10 MG tablet Commonly known as: NORVASC Take 1 tablet by mouth once daily   atorvastatin 20 MG tablet Commonly known as: LIPITOR TAKE 1 TABLET BY MOUTH DAILY   folic acid 1 MG tablet Commonly known as: FOLVITE Take 1 tablet by mouth once daily   Ilevro 0.3 % ophthalmic suspension Generic drug: nepafenac 1 drop daily.   metFORMIN 500 MG tablet Commonly known as: GLUCOPHAGE Take 1 tablet (500 mg total) by mouth 2 (two) times daily.   moxifloxacin 0.5 % ophthalmic solution Commonly known as: VIGAMOX Apply 1 drop to eye 3 (three) times daily.   pantoprazole 40 MG tablet Commonly known as: Protonix Take 1 tablet (40 mg total) by mouth 2 (two) times daily. What changed: when to take this   prednisoLONE acetate 1 % ophthalmic suspension Commonly known as: PRED FORTE 1 drop 2 (two) times daily.   rivaroxaban 20 MG Tabs tablet Commonly known as: XARELTO Take 1 tablet (20 mg total) by mouth daily with supper. Start taking on: October 28, 2021   Vitamin D3 25 MCG (1000 UT) Caps Take 1 capsule by mouth daily.        Follow-up Information     Montez Morita, Quillian Quince, MD. Schedule an appointment as soon as possible for a visit in 1 month(s).   Specialty: Gastroenterology Why: Hospital Follow Up Contact information: [address] [place] [address] [phone]         [name], MD. Schedule an appointment as soon as possible for a visit in 1 week(s).   Specialty: Internal Medicine Why: Hospital Follow Up Contact information: [address] [place] [address] [phone]         Donato Heinz, MD .   Specialties: Cardiology, Radiology Contact information: [address] [place] [address] [phone]                Allergies  Allergen Reactions   Penicillins Rash    Has patient had a PCN reaction causing immediate rash, facial/tongue/throat swelling, SOB or lightheadedness with hypotension: {Yes/No:[id number] Has patient had a PCN reaction causing severe rash involving mucus membranes or skin necrosis: Yes Has patient had a PCN reaction that required hospitalization No Has patient had a PCN reaction occurring within the last 10 years: No If all of the above answers are "NO", then may proceed with Cephalosporin use.  **Has tolerated keflex    Allergies as of 10/27/2021       Reactions   Penicillins Rash   Has patient had a PCN reaction causing immediate rash, facial/tongue/throat swelling, SOB or lightheadedness with hypotension: {Yes/No:[id number] Has patient had a PCN reaction causing  severe rash involving mucus membranes or skin necrosis: Yes Has patient had a PCN reaction that required hospitalization No Has patient had a PCN reaction occurring within the last 10 years: No If all of the above answers are "NO", then may proceed with Cephalosporin use. **Has tolerated keflex        Medication List     STOP taking these medications    olmesartan 40 MG tablet Commonly known as: BENICAR       TAKE these medications    albuterol 108 (90 Base) MCG/ACT inhaler Commonly known as: VENTOLIN HFA Inhale 2 puffs into the lungs every 6 (six) hours as needed for wheezing or shortness of breath. What changed: Another medication with the same name was removed. Continue taking this medication, and follow the directions you see here.   amLODipine 10 MG tablet Commonly known as: NORVASC Take 1 tablet by mouth once daily   atorvastatin 20 MG tablet Commonly  known as: LIPITOR TAKE 1 TABLET BY MOUTH DAILY   folic acid 1 MG tablet Commonly known as: FOLVITE Take 1 tablet by mouth once daily   Ilevro 0.3 % ophthalmic suspension Generic drug: nepafenac 1 drop daily.   metFORMIN 500 MG tablet Commonly known as: GLUCOPHAGE Take 1 tablet (500 mg total) by mouth 2 (two) times daily.   moxifloxacin 0.5 % ophthalmic solution Commonly known as: VIGAMOX Apply 1 drop to eye 3 (three) times daily.   pantoprazole 40 MG tablet Commonly known as: Protonix Take 1 tablet (40 mg total) by mouth 2 (two) times daily. What changed: when to take this   prednisoLONE acetate 1 % ophthalmic suspension Commonly known as: PRED FORTE 1 drop 2 (two) times daily.   rivaroxaban 20 MG Tabs tablet Commonly known as: XARELTO Take 1 tablet (20 mg total) by mouth daily with supper. Start taking on: October 28, 2021   Vitamin D3 25 MCG (1000 UT) Caps Take 1 capsule by mouth daily.        Procedures/Studies: No results found.   Subjective: Pt reports no further black stools, feeling well, wanting to go home.   Discharge Exam: Vitals:   10/26/21 2017 10/27/21 0503  BP: (!) 117/51 (!) 116/58  Pulse: 80 74  Resp: 17 16  Temp: 98.7 F (37.1 C) 98.1 F (36.7 C)  SpO2: 93% 95%   Vitals:   10/26/21 1100 10/26/21 1247 10/26/21 2017 10/27/21 0503  BP: (!) 138/38 (!) 97/53 (!) 117/51 (!) 116/58  Pulse: 86  80 74  Resp: _0 Temp: 99.2 F (37.3 C)  98.7 F (37.1 C) 98.1 F (36.7 C)  TempSrc: Oral     SpO2: 99%  93% 95%  Weight:      Height:       General: Pt is alert, awake, not in acute distress Cardiovascular: RRR, S1/S2 +, no rubs, no gallops Respiratory: CTA bilaterally, no wheezing, no rhonchi Abdominal: Soft, NT, ND, bowel sounds + Extremities: no edema, no cyanosis   The results of significant diagnostics from this hospitalization (including imaging, microbiology, ancillary and laboratory) are listed below for reference.      Microbiology: No results found for this or any previous visit (from the past 240 hour(s)).   Labs: BNP (last 3 results) Recent Labs    03/13/21 1445  BNP 770.3*   Basic Metabolic Panel: Recent Labs  Lab 10/24/21 1453 10/25/21 0444 10/26/21 0600  NA 138 140 141  K 4.7 4.9 4.2  CL 104 111  108  CO2 _0 GLUCOSE 87 99 98  BUN 59* 46* 23  CREATININE 1.23* 1.15* 0.83  CALCIUM 8.9 8.2* 8.3*   Liver Function Tests: No results for input(s): "AST", "ALT", "ALKPHOS", "BILITOT", "PROT", "ALBUMIN" in the last 168 hours. No results for input(s): "LIPASE", "AMYLASE" in the last 168 hours. No results for input(s): "AMMONIA" in the last 168 hours. CBC: Recent Labs  Lab 10/24/21 1453 10/25/21 0444 10/25/21 1218 10/26/21 0600 10/27/21 0856  WBC 7.8 8.0  --  11.7* 10.1  HGB 7.1* 6.3* 8.0* 8.0* 8.9*  HCT 24.3* 21.2* 25.0* 25.6* 29.7*  MCV 87.1 86.2  --  84.8 87.4  PLT 261 218  --  230 247   Cardiac Enzymes: No results for input(s): "CKTOTAL", "CKMB", "CKMBINDEX", "TROPONINI" in the last 168 hours. BNP: Invalid input(s): "POCBNP" CBG: No results for input(s): "GLUCAP" in the last 168 hours. D-Dimer No results for input(s): "DDIMER" in the last 72 hours. Hgb A1c No results for input(s): "HGBA1C" in the last 72 hours. Lipid Profile No results for input(s): "CHOL", "HDL", "LDLCALC", "TRIG", "CHOLHDL", "LDLDIRECT" in the last 72 hours. Thyroid function studies No results for input(s): "TSH", "T4TOTAL", "T3FREE", "THYROIDAB" in the last 72 hours.  Invalid input(s): "FREET3" Anemia work up Recent Labs    10/24/21 1453  VITAMINB12 247   Urinalysis    Component Value Date/Time   COLORURINE STRAW (A) 10/24/2021 2300   APPEARANCEUR CLEAR 10/24/2021 2300   LABSPEC 1.012 10/24/2021 2300   PHURINE 5.0 10/24/2021 2300   GLUCOSEU NEGATIVE 10/24/2021 2300   HGBUR NEGATIVE 10/24/2021 2300   BILIRUBINUR NEGATIVE 10/24/2021 2300   KETONESUR NEGATIVE 10/24/2021 2300    PROTEINUR NEGATIVE 10/24/2021 2300   NITRITE NEGATIVE 10/24/2021 2300   LEUKOCYTESUR NEGATIVE 10/24/2021 2300   Sepsis Labs Recent Labs  Lab 10/24/21 1453 10/25/21 0444 10/26/21 0600 10/27/21 0856  WBC 7.8 8.0 11.7* 10.1   Microbiology No results found for this or any previous visit (from the past 240 hour(s)).  Time coordinating discharge: 31 mins   SIGNED:  Irwin Brakeman, MD  [facility] 10/27/2021, 10:50 AM How to contact the [facility] Attending or Consulting provider [place] or covering provider during after hours [place], for this patient?  Check the care team in [facility] and look for a) attending/consulting [facility] provider listed and b) the [facility] team listed Log into [email or web address] and use [facility] universal password to access. If you do not have the password, please contact the hospital operator. Locate the [facility] provider you are looking for under [facility] and page to a number that you can be directly reached. If you still have difficulty reaching the provider, please page the [facility] (Director on Call) for the Hospitalists listed on amion for assistance.

## 2021-10-27 NOTE — Progress Notes (Signed)
PT Cancellation Note  Patient Details Name: Marisa Bailey MRN: [id number] DOB: 02/14/1940   Cancelled Treatment:    Reason Eval/Treat Not Completed: PT screened, no needs identified, will sign off.  Patient ambulating independently in room.   12:28 PM, 10/27/21 Lonell Grandchild, MPT Physical Therapist with [facility] [phone] office [phone] mobile phone

## 2021-10-27 NOTE — Discharge Instructions (Signed)
IMPORTANT INFORMATION: PAY CLOSE ATTENTION   PHYSICIAN DISCHARGE INSTRUCTIONS  Follow with Primary care provider  [name], MD  and other consultants as instructed by your Hospitalist Physician  [place] IF SYMPTOMS COME BACK, WORSEN OR NEW PROBLEM DEVELOPS   Please note: You were cared for by a hospitalist during your hospital stay. Every effort will be made to forward records to your primary care provider.  You can request that your primary care provider send for your hospital records if they have not received them.  Once you are discharged, your primary care physician will handle any further medical issues. Please note that NO REFILLS for any discharge medications will be authorized once you are discharged, as it is imperative that you return to your primary care physician (or establish a relationship with a primary care physician if you do not have one) for your post hospital discharge needs so that they can reassess your need for medications and monitor your lab values.  Please get a complete blood count and chemistry panel checked by your Primary MD at your next visit, and again as instructed by your Primary MD.  Get Medicines reviewed and adjusted: Please take all your medications with you for your next visit with your Primary MD  Laboratory/radiological data: Please request your Primary MD to go over all hospital tests and procedure/radiological results at the follow up, please ask your primary care provider to get all Hospital records sent to his/her office.  In some cases, they will be blood work, cultures and biopsy results pending at the time of your discharge. Please request that your primary care provider follow up on these results.  If you are diabetic, please bring your blood sugar readings with you to your follow up appointment with primary care.    Please call and make your follow up appointments as soon as possible.    Also Note  the following: If you experience worsening of your admission symptoms, develop shortness of breath, life threatening emergency, suicidal or homicidal thoughts you must seek medical attention immediately by calling 911 or calling your MD immediately  if symptoms less severe.  You must read complete instructions/literature along with all the possible adverse reactions/side effects for all the Medicines you take and that have been prescribed to you. Take any new Medicines after you have completely understood and accpet all the possible adverse reactions/side effects.   Do not drive when taking Pain medications or sleeping medications (Benzodiazepines)  Do not take more than prescribed Pain, Sleep and Anxiety Medications. It is not advisable to combine anxiety,sleep and pain medications without talking with your primary care practitioner  Special Instructions: If you have smoked or chewed Tobacco  in the last 2 yrs please stop smoking, stop any regular Alcohol  and or any Recreational drug use.  Wear Seat belts while driving.  Do not drive if taking any narcotic, mind altering or controlled substances or recreational drugs or alcohol.

## 2021-10-28 DIAGNOSIS — Z79899 Other long term (current) drug therapy: Secondary | ICD-10-CM

## 2021-10-29 ENCOUNTER — Telehealth: Payer: Self-pay | Admitting: *Deleted

## 2021-10-29 NOTE — Telephone Encounter (Signed)
Apt sch'd 11/28/21 at 830 with Roseanne Kaufman, apt note mailed to patient

## 2021-10-29 NOTE — Telephone Encounter (Signed)
Transition Care Management Follow-up Telephone Call Date of discharge and from where: 10-27-21 [facility] How have you been since you were released from the hospital? Doing fine Any questions or concerns? No  Items Reviewed: Did the pt receive and understand the discharge instructions provided? Yes  Medications obtained and verified?  Will review medication with patient Other? No  Any new allergies since your discharge? No  Dietary orders reviewed? Yes Do you have support at home? No   Home Care and Equipment/Supplies: Were home health services ordered? not applicable If so, what is the name of the agency? NA  Has the agency set up a time to come to the patient's home? not applicable Were any new equipment or medical supplies ordered?  No What is the name of the medical supply agency? NA Were you able to get the supplies/equipment? not applicable Do you have any questions related to the use of the equipment or supplies? No  Functional Questionnaire: (I = Independent and D = Dependent) ADLs: i  Bathing/Dressing- i  Meal Prep- i  Eating- i  Maintaining continence- i  Transferring/Ambulation- i  Managing Meds- i  Follow up appointments reviewed:  PCP Hospital f/u appt confirmed? Yes  Scheduled to see Posey Pronto on 10-30-21 @ 11:20am. [place] [facility] f/u appt confirmed? Yes   Are transportation arrangements needed? No  If their condition worsens, is the pt aware to call PCP or go to the Emergency Dept.? Yes Was the patient provided with contact information for the PCP's office or ED? Yes Was to pt encouraged to call back with questions or concerns? Yes

## 2021-10-30 ENCOUNTER — Ambulatory Visit (INDEPENDENT_AMBULATORY_CARE_PROVIDER_SITE_OTHER): Payer: Medicare Other | Admitting: Internal Medicine

## 2021-10-30 ENCOUNTER — Encounter: Payer: Self-pay | Admitting: Internal Medicine

## 2021-10-30 VITALS — BP 116/67 | HR 82 | Ht 64.0 in | Wt 141.0 lb

## 2021-10-30 DIAGNOSIS — Z2821 Immunization not carried out because of patient refusal: Secondary | ICD-10-CM | POA: Diagnosis not present

## 2021-10-30 DIAGNOSIS — I10 Essential (primary) hypertension: Secondary | ICD-10-CM

## 2021-10-30 DIAGNOSIS — I2694 Multiple subsegmental pulmonary emboli without acute cor pulmonale: Secondary | ICD-10-CM | POA: Diagnosis not present

## 2021-10-30 DIAGNOSIS — J470 Bronchiectasis with acute lower respiratory infection: Secondary | ICD-10-CM | POA: Diagnosis not present

## 2021-10-30 DIAGNOSIS — R053 Chronic cough: Secondary | ICD-10-CM | POA: Diagnosis not present

## 2021-10-30 DIAGNOSIS — Z09 Encounter for follow-up examination after completed treatment for conditions other than malignant neoplasm: Secondary | ICD-10-CM | POA: Insufficient documentation

## 2021-10-30 DIAGNOSIS — D649 Anemia, unspecified: Secondary | ICD-10-CM

## 2021-10-30 DIAGNOSIS — K921 Melena: Secondary | ICD-10-CM | POA: Diagnosis not present

## 2021-10-30 LAB — HEMOGLOBIN AND HEMATOCRIT, BLOOD
HCT: 25 % — ABNORMAL LOW (ref 36.0–46.0)
Hemoglobin: 8 g/dL — ABNORMAL LOW (ref 12.0–15.0)

## 2021-10-30 MED ORDER — BENZONATATE 100 MG PO CAPS: 100.0000 mg | ORAL_CAPSULE | Freq: Two times a day (BID) | ORAL | 0 refills | Status: DC | PRN

## 2021-10-30 MED ORDER — FUSION PLUS PO CAPS: 1.0000 | ORAL_CAPSULE | Freq: Every day | ORAL | 5 refills | Status: DC

## 2021-10-30 NOTE — Assessment & Plan Note (Signed)
Due to UGIB, now resolved No melena or hematochezia S/p 1 U PRBC Check CBC in the next week Started fusion plus supplement

## 2021-10-30 NOTE — Assessment & Plan Note (Signed)
Hospital chart reviewed, including discharge summary Medications reconciled and reviewed with the patient in detail

## 2021-10-30 NOTE — Assessment & Plan Note (Signed)
EGD findings of red blood and clots in gastric body, dieulafoy lesion cauterized, clipped, epinephrine injected Was on Xarelto, but GI cleared to restart it now Needs to take Pantoprazole, she expressed understanding. F/u with GI in outpatient setting.

## 2021-10-30 NOTE — Progress Notes (Addendum)
Established Patient Office Visit  Subjective:  Patient ID: Marisa Bailey, female    DOB: January 15, 1941  Age: 81 y.o. MRN: [id number]  CC:  Chief Complaint  Patient presents with   Transitions Of Care    Follow up was released from [facility] 10-27-21     HPI Marisa Bailey is a 81 y.o. female with past medical history of HTN, HFpEF, COPD with chronic hypoxic respiratory failure and tobacco abuse who presents for f/u after recent hospitalization for symptomatic anemia due to UGIB from 09/13-09/16.  She was brought to ER for chronic fatigue and dyspnea, which was progressively worsening.  Her initial work-up showed acute drop in Hb to 7.1, dropped from 12 a month ago, initially blood pressure in the 90s, this has improved with IV fluid, she was Hemoccult positive with melena noted, creatinine up to 1.23, from 0.8, patient was ordered 1 unit PRBC. EGD findings of red blood and clots in gastric body, dieulafoy lesion cauterized, clipped, epinephrine injected - rechecked CBC Hg improved to 8.9. Pt was treated with IV pantoprazole BID, advanced to soft diet.  Her Xarelto was held due to UGIB, but was later advised to restart upon discharge.  Her BP was low normal during her hospital stay, and her olmesartan was discontinued.  Today, she denies any episode of melena since being discharged.  Of note, she has not been taking pantoprazole as she thought she was told not to take it from the hospital, but agrees to start taking it.  She has also not started taking Xarelto yet.  She reports improvement in her fatigue, and denies any chest pain or dyspnea currently.  Past Medical History:  Diagnosis Date   Aspiration pneumonia ([place]) 11/03/2020   Blind right eye    CHF (congestive heart failure) (HCC)    COPD (chronic obstructive pulmonary disease) (HCC)    Diabetes mellitus without complication ([place])    Fx upper humerus-closed 08/17/2012   H/O blood clots    lungs   Hypertension     Past Surgical  History:  Procedure Laterality Date   CATARACT EXTRACTION W/PHACO Left 09/24/2021   Procedure: CATARACT EXTRACTION PHACO AND INTRAOCULAR LENS PLACEMENT ([place]);  Surgeon: Baruch Goldmann, MD;  Location: [facility];  Service: Ophthalmology;  Laterality: Left;  CDE: 8.08   ENUCLEATION     ESOPHAGOGASTRODUODENOSCOPY (EGD) WITH PROPOFOL N/A 10/25/2021   Procedure: ESOPHAGOGASTRODUODENOSCOPY (EGD) WITH PROPOFOL;  Surgeon: Harvel Quale, MD;  Location: [facility];  Service: Gastroenterology;  Laterality: N/A;   HEMOSTASIS CLIP PLACEMENT  10/25/2021   Procedure: HEMOSTASIS CLIP PLACEMENT;  Surgeon: Harvel Quale, MD;  Location: [facility];  Service: Gastroenterology;;   HOT HEMOSTASIS  10/25/2021   Procedure: HOT HEMOSTASIS (ARGON PLASMA COAGULATION/BICAP);  Surgeon: Montez Morita, Quillian Quince, MD;  Location: [facility];  Service: Gastroenterology;;   SUBMUCOSAL INJECTION  10/25/2021   Procedure: SUBMUCOSAL INJECTION;  Surgeon: Harvel Quale, MD;  [facility];  Service: Gastroenterology;;    Family History  Problem Relation Age of Onset   Diabetes Mother     Social History   Socioeconomic History   Marital status: Single    Spouse name: Not on file   Number of children: Not on file   Years of education: Not on file   Highest education level: Not on file  Occupational History   Not on file  Tobacco Use   Smoking status: Every Day    Packs/day: 1.00    Years:  18.00    Total pack years: 18.00    Types: Cigarettes   Smokeless tobacco: Never  Vaping Use   Vaping Use: Never used  Substance and Sexual Activity   Alcohol use: Not Currently    Comment: rarely   Drug use: No   Sexual activity: Not on file  Other Topics Concern   Not on file  Social History Narrative   Retired (from [employer]). No children, never been married. Has 8 siblings (5 are deceased).   Social Determinants of Health   Financial Resource Strain: Low Risk  (11/30/2020)    Overall Financial Resource Strain (CARDIA)    Difficulty of Paying Living Expenses: Not hard at all  Food Insecurity: No Food Insecurity (10/24/2021)   Hunger Vital Sign    Worried About Running Out of Food in the Last Year: Never true    Ran Out of Food in the Last Year: Never true  Transportation Needs: No Transportation Needs (10/24/2021)   PRAPARE - Hydrologist (Medical): No    Lack of Transportation (Non-Medical): No  Physical Activity: Sufficiently Active (11/30/2020)   Exercise Vital Sign    Days of Exercise per Week: 7 days    Minutes of Exercise per Session: 30 min  Stress: No Stress Concern Present (11/30/2020)   [place]    Feeling of Stress : Not at all  Social Connections: Moderately Isolated (11/30/2020)   Social Connection and Isolation Panel [NHANES]    Frequency of Communication with Friends and Family: More than three times a week    Frequency of Social Gatherings with Friends and Family: More than three times a week    Attends Religious Services: 1 to 4 times per year    Active Member of [employer] or Organizations: No    Attends Archivist Meetings: Never    Marital Status: Never married  Intimate Partner Violence: Not At Risk (10/24/2021)   Humiliation, Afraid, Rape, and Kick questionnaire    Fear of Current or Ex-Partner: No    Emotionally Abused: No    Physically Abused: No    Sexually Abused: No    Outpatient Medications Prior to Visit  Medication Sig Dispense Refill   albuterol (VENTOLIN HFA) 108 (90 Base) MCG/ACT inhaler Inhale 2 puffs into the lungs every 6 (six) hours as needed for wheezing or shortness of breath. 8 g 2   amLODipine (NORVASC) 10 MG tablet Take 1 tablet by mouth once daily 90 tablet 0   atorvastatin (LIPITOR) 20 MG tablet TAKE 1 TABLET BY MOUTH DAILY 90 tablet 3   Cholecalciferol (VITAMIN D3) 25 MCG (1000 UT) CAPS Take 1 capsule by mouth  daily.     folic acid (FOLVITE) 1 MG tablet Take 1 tablet by mouth once daily 90 tablet 0   metFORMIN (GLUCOPHAGE) 500 MG tablet Take 1 tablet (500 mg total) by mouth 2 (two) times daily. 180 tablet 1   prednisoLONE acetate (PRED FORTE) 1 % ophthalmic suspension 1 drop 2 (two) times daily.     ILEVRO 0.3 % ophthalmic suspension 1 drop daily.     moxifloxacin (VIGAMOX) 0.5 % ophthalmic solution Apply 1 drop to eye 3 (three) times daily.     pantoprazole (PROTONIX) 40 MG tablet Take 1 tablet (40 mg total) by mouth 2 (two) times daily. (Patient not taking: Reported on 11/28/2021) 60 tablet 1   rivaroxaban (XARELTO) 20 MG TABS tablet Take 1 tablet (20 mg  total) by mouth daily with supper. (Patient not taking: Reported on 11/28/2021) 30 tablet 5   No facility-administered medications prior to visit.    Allergies  Allergen Reactions   Penicillins Rash    Has patient had a PCN reaction causing immediate rash, facial/tongue/throat swelling, SOB or lightheadedness with hypotension: {Yes/No:[id number] Has patient had a PCN reaction causing severe rash involving mucus membranes or skin necrosis: Yes Has patient had a PCN reaction that required hospitalization No Has patient had a PCN reaction occurring within the last 10 years: No If all of the above answers are "NO", then may proceed with Cephalosporin use.  **Has tolerated keflex     ROS Review of Systems  Constitutional:  Negative for chills and fever.  HENT:  Negative for congestion, sinus pressure and sore throat.   Eyes:  Negative for pain and discharge.       Right eye blindness  Respiratory:  Positive for cough. Negative for shortness of breath.   Cardiovascular:  Negative for chest pain and palpitations.  Gastrointestinal:  Negative for abdominal pain, diarrhea, nausea and vomiting.  Endocrine: Negative for polydipsia and polyuria.  Genitourinary:  Negative for dysuria and hematuria.  Musculoskeletal:  Negative for neck pain and neck  stiffness.  Skin:  Negative for rash.  Neurological:  Negative for dizziness and weakness.  Psychiatric/Behavioral:  Negative for agitation and behavioral problems.       Objective:    Physical Exam Vitals reviewed.  Constitutional:      General: She is not in acute distress.    Appearance: She is not diaphoretic.  HENT:     Head: Normocephalic and atraumatic.     Mouth/Throat:     Mouth: Mucous membranes are moist.  Eyes:     General: No scleral icterus.    Comments: Right eye blindness  Cardiovascular:     Rate and Rhythm: Normal rate and regular rhythm.     Pulses: Normal pulses.     Heart sounds: Normal heart sounds. No murmur heard. Pulmonary:     Breath sounds: Normal breath sounds. No wheezing or rales.  Abdominal:     Palpations: Abdomen is soft.     Tenderness: There is no abdominal tenderness.  Musculoskeletal:     Cervical back: Neck supple. No tenderness.     Right lower leg: No edema.     Left lower leg: No edema.  Skin:    General: Skin is warm.     Findings: No rash.  Neurological:     General: No focal deficit present.     Mental Status: She is alert and oriented to person, place, and time.     Sensory: No sensory deficit.     Motor: No weakness.  Psychiatric:        Mood and Affect: Mood normal.        Behavior: Behavior normal.     BP 116/67 (BP Location: Left Arm, Patient Position: Sitting, Cuff Size: Normal)   Pulse 82   Ht _0  (1.626 m)   Wt 141 lb (64 kg)   SpO2 99%   BMI 24.20 kg/m  Wt Readings from Last 3 Encounters:  11/28/21 137 lb (62.1 kg)  10/30/21 141 lb (64 kg)  10/25/21 139 lb 1.6 oz (63.1 kg)    Lab Results  Component Value Date   TSH 0.608 09/11/2021   Lab Results  Component Value Date   WBC 6.5 11/28/2021   HGB 9.2 (L) 11/28/2021   HCT 30.8 (  L) 11/28/2021   MCV 80.4 11/28/2021   PLT 331 11/28/2021   Lab Results  Component Value Date   NA 141 11/06/2021   K 4.5 11/06/2021   CO2 24 11/06/2021   GLUCOSE  101 (H) 11/06/2021   BUN 9 11/06/2021   CREATININE 0.78 11/06/2021   BILITOT 0.7 09/11/2021   ALKPHOS 80 09/11/2021   AST 14 09/11/2021   ALT 8 09/11/2021   PROT 7.0 09/11/2021   ALBUMIN 4.2 09/11/2021   CALCIUM 9.0 11/06/2021   ANIONGAP 5 10/26/2021   EGFR 76 11/06/2021   Lab Results  Component Value Date   CHOL 133 09/11/2021   Lab Results  Component Value Date   HDL 54 09/11/2021   Lab Results  Component Value Date   LDLCALC 62 09/11/2021   Lab Results  Component Value Date   TRIG 88 09/11/2021   Lab Results  Component Value Date   CHOLHDL 2.5 09/11/2021   Lab Results  Component Value Date   HGBA1C 5.9 (H) 09/11/2021      Assessment & Plan:   Problem List Items Addressed This Visit       Cardiovascular and Mediastinum   Essential hypertension    BP Readings from Last 1 Encounters:  10/30/21 116/67  Well-controlled with amlodipine olmesartan recently discontinued due to low BP during hospitalization Counseled for compliance with the medications Advised DASH diet and moderate exercise/walking as tolerated      Relevant Orders   Basic Metabolic Panel (BMET) (Completed)   Pulmonary emboli ([place])    Was recently admitted for PE Stopped taking Eliquis due to rash Had switched to Xarelto 20 mg QD, needs to restart taking it now Emphasized importance of AC        Respiratory   Bronchiectasis with acute lower respiratory infection ([place])    CT chest showed bronchiectasis Has had frequent episodes of CAP and/or aspiration pneumonia requiring antibiotic therapy Chronic productive cough for many years She has tried Acapella and chest PT (during hospitalization) - Acapella does not help much and patient is not a reliable candidate for Acapella use without assistance Will try to get Afflowest          Digestive   GI bleed    EGD findings of red blood and clots in gastric body, dieulafoy lesion cauterized, clipped, epinephrine injected Was on Xarelto,  but GI cleared to restart it now Needs to take Pantoprazole, she expressed understanding. F/u with GI in outpatient setting.        Other   Symptomatic anemia - Primary    Due to UGIB, now resolved No melena or hematochezia S/p 1 U PRBC Check CBC in the next week Started fusion plus supplement      Relevant Orders   CBC with Differential/Platelet (Completed)   Refused influenza vaccine   Hospital discharge follow-up    Hospital chart reviewed, including discharge summary Medications reconciled and reviewed with the patient in detail      Relevant Orders   CBC with Differential/Platelet (Completed)   Basic Metabolic Panel (BMET) (Completed)   Other Visit Diagnoses     Chronic cough           Meds ordered this encounter  Medications   DISCONTD: Iron-FA-B Cmp-C-Biot-Probiotic (FUSION PLUS) CAPS    Sig: Take 1 tablet by mouth daily.    Dispense:  30 capsule    Refill:  5   DISCONTD: benzonatate (TESSALON) 100 MG capsule    Sig: Take 1 capsule (100  mg total) by mouth 2 (two) times daily as needed for cough.    Dispense:  20 capsule    Refill:  0    Follow-up: Return if symptoms worsen or fail to improve.    [name], MD

## 2021-10-30 NOTE — Assessment & Plan Note (Signed)
Was recently admitted for PE Stopped taking Eliquis due to rash Had switched to Xarelto 20 mg QD, needs to restart taking it now Emphasized importance of [facility]

## 2021-10-30 NOTE — Patient Instructions (Signed)
Please do not take Meloxicam and Olmesartan.  Please continue taking other medications as prescribed.  Please continue to follow low salt diet and ambulate as tolerated.

## 2021-10-30 NOTE — Assessment & Plan Note (Signed)
BP Readings from Last 1 Encounters:  10/30/21 116/67   Well-controlled with amlodipine olmesartan recently discontinued due to low BP during hospitalization Counseled for compliance with the medications Advised DASH diet and moderate exercise/walking as tolerated

## 2021-11-06 DIAGNOSIS — D649 Anemia, unspecified: Secondary | ICD-10-CM | POA: Diagnosis not present

## 2021-11-06 DIAGNOSIS — I10 Essential (primary) hypertension: Secondary | ICD-10-CM | POA: Diagnosis not present

## 2021-11-06 DIAGNOSIS — Z09 Encounter for follow-up examination after completed treatment for conditions other than malignant neoplasm: Secondary | ICD-10-CM | POA: Diagnosis not present

## 2021-11-07 LAB — CBC WITH DIFFERENTIAL/PLATELET
Basophils Absolute: 0 10*3/uL (ref 0.0–0.2)
Basos: 1 %
EOS (ABSOLUTE): 0.4 10*3/uL (ref 0.0–0.4)
Eos: 6 %
Hematocrit: 29.7 % — ABNORMAL LOW (ref 34.0–46.6)
Hemoglobin: 8.8 g/dL — ABNORMAL LOW (ref 11.1–15.9)
Immature Grans (Abs): 0 10*3/uL (ref 0.0–0.1)
Immature Granulocytes: 0 %
Lymphocytes Absolute: 0.8 10*3/uL (ref 0.7–3.1)
Lymphs: 14 %
MCH: 24.8 pg — ABNORMAL LOW (ref 26.6–33.0)
MCHC: 29.6 g/dL — ABNORMAL LOW (ref 31.5–35.7)
MCV: 84 fL (ref 79–97)
Monocytes Absolute: 0.5 10*3/uL (ref 0.1–0.9)
Monocytes: 8 %
Neutrophils Absolute: 4.4 10*3/uL (ref 1.4–7.0)
Neutrophils: 71 %
Platelets: 413 10*3/uL (ref 150–450)
RBC: 3.55 x10E6/uL — ABNORMAL LOW (ref 3.77–5.28)
RDW: 15.9 % — ABNORMAL HIGH (ref 11.7–15.4)
WBC: 6.1 10*3/uL (ref 3.4–10.8)

## 2021-11-07 LAB — BASIC METABOLIC PANEL
BUN/Creatinine Ratio: 12 (ref 12–28)
BUN: 9 mg/dL (ref 8–27)
CO2: 24 mmol/L (ref 20–29)
Calcium: 9 mg/dL (ref 8.7–10.3)
Chloride: 102 mmol/L (ref 96–106)
Creatinine, Ser: 0.78 mg/dL (ref 0.57–1.00)
Glucose: 101 mg/dL — ABNORMAL HIGH (ref 70–99)
Potassium: 4.5 mmol/L (ref 3.5–5.2)
Sodium: 141 mmol/L (ref 134–144)
eGFR: 76 mL/min/{1.73_m2} (ref >59)

## 2021-11-17 DIAGNOSIS — J449 Chronic obstructive pulmonary disease, unspecified: Secondary | ICD-10-CM | POA: Diagnosis not present

## 2021-11-26 DIAGNOSIS — J449 Chronic obstructive pulmonary disease, unspecified: Secondary | ICD-10-CM | POA: Diagnosis not present

## 2021-11-28 ENCOUNTER — Other Ambulatory Visit: Payer: Self-pay | Admitting: Internal Medicine

## 2021-11-28 ENCOUNTER — Other Ambulatory Visit: Payer: Self-pay

## 2021-11-28 ENCOUNTER — Ambulatory Visit (INDEPENDENT_AMBULATORY_CARE_PROVIDER_SITE_OTHER): Payer: Medicare Other | Admitting: Gastroenterology

## 2021-11-28 ENCOUNTER — Encounter: Payer: Self-pay | Admitting: Gastroenterology

## 2021-11-28 VITALS — BP 130/61 | HR 88 | Temp 97.9°F | Ht 64.0 in | Wt 137.0 lb

## 2021-11-28 DIAGNOSIS — J439 Emphysema, unspecified: Secondary | ICD-10-CM

## 2021-11-28 DIAGNOSIS — I10 Essential (primary) hypertension: Secondary | ICD-10-CM

## 2021-11-28 DIAGNOSIS — J9611 Chronic respiratory failure with hypoxia: Secondary | ICD-10-CM

## 2021-11-28 DIAGNOSIS — K922 Gastrointestinal hemorrhage, unspecified: Secondary | ICD-10-CM

## 2021-11-28 DIAGNOSIS — I2723 Pulmonary hypertension due to lung diseases and hypoxia: Secondary | ICD-10-CM

## 2021-11-28 DIAGNOSIS — I5032 Chronic diastolic (congestive) heart failure: Secondary | ICD-10-CM

## 2021-11-28 LAB — CBC WITH DIFFERENTIAL/PLATELET
Absolute Monocytes: 553 cells/uL (ref 200–950)
Basophils Absolute: 33 cells/uL (ref 0–200)
Basophils Relative: 0.5 %
Eosinophils Absolute: 384 cells/uL (ref 15–500)
Eosinophils Relative: 5.9 %
HCT: 30.8 % — ABNORMAL LOW (ref 35.0–45.0)
Hemoglobin: 9.2 g/dL — ABNORMAL LOW (ref 11.7–15.5)
Lymphs Abs: 936 cells/uL (ref 850–3900)
MCH: 24 pg — ABNORMAL LOW (ref 27.0–33.0)
MCHC: 29.9 g/dL — ABNORMAL LOW (ref 32.0–36.0)
MCV: 80.4 fL (ref 80.0–100.0)
MPV: 10.6 fL (ref 7.5–12.5)
Monocytes Relative: 8.5 %
Neutro Abs: 4596 cells/uL (ref 1500–7800)
Neutrophils Relative %: 70.7 %
Platelets: 331 10*3/uL (ref 140–400)
RBC: 3.83 10*6/uL (ref 3.80–5.10)
RDW: 16.8 % — ABNORMAL HIGH (ref 11.0–15.0)
Total Lymphocyte: 14.4 %
WBC: 6.5 10*3/uL (ref 3.8–10.8)

## 2021-11-28 MED ORDER — PANTOPRAZOLE SODIUM 40 MG PO TBEC: 40.0000 mg | DELAYED_RELEASE_TABLET | Freq: Every day | ORAL | 3 refills | Status: DC

## 2021-11-28 NOTE — Progress Notes (Signed)
Gastroenterology Office Note     Primary Care Physician:  [name], MD  Primary Gastroenterologist:   Chief Complaint   Chief Complaint  Patient presents with   Hospitalization Follow-up    Patient here today for a hospital follow up from 10/24/2021 due to symptomatic anemia, and melena. Had Egd done on 09/14/203. Patient denies any current issues with dark or bloody stools, denies any shortness of breath, fatigue or dizziness.  Patient denies knowing anything about her Fe pill nor her blood thinner.     History of Present Illness   Marisa Bailey is an 81 y.o. female presenting today in follow-up with a history of COPD, hypertension, CHF, diabetes, anemia and pulmonary embolism on Xarelto, who returns in hospital follow-up after admission for acute blood loss anemia due to UGI bleed.   During admission, she underwent EGD with likely Dieulafoy lesion s/p cautery and hemostasis clip. She received 2 units PRBCs during admission. Lowest Hgb was 6.3, and at discharge was 8.9. Recently Hgb checked as outpatient 8.8.  No abdominal pain, N/V, changes in bowel habits, constipation, diarrhea, overt GI bleeding, GERD, dysphagia, unexplained weight loss, lack of appetite, unexplained weight gain.   She has not been taking Xarelto as prescribed. Not currently taking iron or PPI. She states finances are tight.      Past Medical History:  Diagnosis Date   Aspiration pneumonia ([place]) 11/03/2020   Blind right eye    CHF (congestive heart failure) (HCC)    COPD (chronic obstructive pulmonary disease) (HCC)    Diabetes mellitus without complication ([place])    Fx upper humerus-closed 08/17/2012   H/O blood clots    lungs   Hypertension     Past Surgical History:  Procedure Laterality Date   CATARACT EXTRACTION W/PHACO Left 09/24/2021   Procedure: CATARACT EXTRACTION PHACO AND INTRAOCULAR LENS PLACEMENT ([place]);  Surgeon: Baruch Goldmann, MD;  Location: [facility];  Service:  Ophthalmology;  Laterality: Left;  CDE: 8.08   ENUCLEATION     ESOPHAGOGASTRODUODENOSCOPY (EGD) WITH PROPOFOL N/A 10/25/2021   Procedure: ESOPHAGOGASTRODUODENOSCOPY (EGD) WITH PROPOFOL;  Surgeon: Harvel Quale, MD;  [facility];  Service: Gastroenterology;  Laterality: N/A;   HEMOSTASIS CLIP PLACEMENT  10/25/2021   Procedure: HEMOSTASIS CLIP PLACEMENT;  Surgeon: Harvel Quale, MD;  [facility];  Service: Gastroenterology;;   HOT HEMOSTASIS  10/25/2021   Procedure: HOT HEMOSTASIS (ARGON PLASMA COAGULATION/BICAP);  Surgeon: Montez Morita, Quillian Quince, MD;  [facility];  Service: Gastroenterology;;   SUBMUCOSAL INJECTION  10/25/2021   Procedure: SUBMUCOSAL INJECTION;  Surgeon: Montez Morita, Quillian Quince, MD;  [facility];  Service: Gastroenterology;;    Current Outpatient Medications  Medication Sig Dispense Refill   albuterol (VENTOLIN HFA) 108 (90 Base) MCG/ACT inhaler Inhale 2 puffs into the lungs every 6 (six) hours as needed for wheezing or shortness of breath. 8 g 2   amLODipine (NORVASC) 10 MG tablet Take 1 tablet by mouth once daily 90 tablet 0   atorvastatin (LIPITOR) 20 MG tablet TAKE 1 TABLET BY MOUTH DAILY 90 tablet 3   Cholecalciferol (VITAMIN D3) 25 MCG (1000 UT) CAPS Take 1 capsule by mouth daily.     folic acid (FOLVITE) 1 MG tablet Take 1 tablet by mouth once daily 90 tablet 0   metFORMIN (GLUCOPHAGE) 500 MG tablet Take 1 tablet (500 mg total) by mouth 2 (two) times daily. 180 tablet 1   pantoprazole (PROTONIX) 40 MG  tablet Take 1 tablet (40 mg total) by mouth daily. 30 minutes before breakfast 90 tablet 3   zinc gluconate 50 MG tablet Take 50 mg by mouth daily.     ILEVRO 0.3 % ophthalmic suspension 1 drop daily.     Iron-FA-B Cmp-C-Biot-Probiotic (FUSION PLUS) CAPS Take 1 tablet by mouth daily. (Patient not taking: Reported on 11/28/2021) 30 capsule 5   moxifloxacin (VIGAMOX) 0.5 % ophthalmic solution Apply 1  drop to eye 3 (three) times daily.     prednisoLONE acetate (PRED FORTE) 1 % ophthalmic suspension 1 drop 2 (two) times daily.     rivaroxaban (XARELTO) 20 MG TABS tablet Take 1 tablet (20 mg total) by mouth daily with supper. 30 tablet 5   No current facility-administered medications for this visit.    Allergies as of 11/28/2021 - Review Complete 11/28/2021  Allergen Reaction Noted   Penicillins Rash 07/07/2012    Family History  Problem Relation Age of Onset   Diabetes Mother     Social History   Socioeconomic History   Marital status: Single    Spouse name: Not on file   Number of children: Not on file   Years of education: Not on file   Highest education level: Not on file  Occupational History   Not on file  Tobacco Use   Smoking status: Every Day    Packs/day: 1.00    Years: 18.00    Total pack years: 18.00    Types: Cigarettes   Smokeless tobacco: Never  Vaping Use   Vaping Use: Never used  Substance and Sexual Activity   Alcohol use: Not Currently    Comment: rarely   Drug use: No   Sexual activity: Not on file  Other Topics Concern   Not on file  Social History Narrative   Retired (from [place] [employer]). No children, never been married. Has 8 siblings (5 are deceased).   Social Determinants of Health   Financial Resource Strain: Low Risk  (11/30/2020)   Overall Financial Resource Strain (CARDIA)    Difficulty of Paying Living Expenses: Not hard at all  Food Insecurity: No Food Insecurity (10/24/2021)   Hunger Vital Sign    Worried About Running Out of Food in the Last Year: Never true    Ran Out of Food in the Last Year: Never true  Transportation Needs: No Transportation Needs (10/24/2021)   PRAPARE - Hydrologist (Medical): No    Lack of Transportation (Non-Medical): No  Physical Activity: Sufficiently Active (11/30/2020)   Exercise Vital Sign    Days of Exercise per Week: 7 days    Minutes of Exercise per Session: 30 min   Stress: No Stress Concern Present (11/30/2020)   [place]    Feeling of Stress : Not at all  Social Connections: Moderately Isolated (11/30/2020)   Social Connection and Isolation Panel [NHANES]    Frequency of Communication with Friends and Family: More than three times a week    Frequency of Social Gatherings with Friends and Family: More than three times a week    Attends Religious Services: 1 to 4 times per year    Active Member of [employer] or Organizations: No    Attends Archivist Meetings: Never    Marital Status: Never married  Intimate Partner Violence: Not At Risk (10/24/2021)   Humiliation, Afraid, Rape, and Kick questionnaire    Fear of Current or Ex-Partner: No  Emotionally Abused: No    Physically Abused: No    Sexually Abused: No     Review of Systems   Gen: Denies any fever, chills, fatigue, weight loss, lack of appetite.  CV: Denies chest pain, heart palpitations, peripheral edema, syncope.  Resp: Denies shortness of breath at rest or with exertion. Denies wheezing or cough.  GI: see HPI GU : Denies urinary burning, urinary frequency, urinary hesitancy MS: Denies joint pain, muscle weakness, cramps, or limitation of movement.  Derm: Denies rash, itching, dry skin Psych: Denies depression, anxiety, memory loss, and confusion Heme: Denies bruising, bleeding, and enlarged lymph nodes.   Physical Exam   BP 130/61 (BP Location: Left Arm, Patient Position: Sitting, Cuff Size: Small)   Pulse 88   Temp 97.9 F (36.6 C) (Oral)   Ht _0  (1.626 m)   Wt 137 lb (62.1 kg)   BMI 23.52 kg/m  General:   Alert and oriented. Pleasant and cooperative. Well-nourished and well-developed.  Head:  Normocephalic and atraumatic. Eyes:  Without icterus Abdomen:  +BS, soft, non-tender and non-distended. No HSM noted. No guarding or rebound. No masses appreciated.  Rectal:  Deferred  Msk:  Symmetrical  without gross deformities. Normal posture. Extremities:  Without edema. Neurologic:  Alert and  oriented x4; Skin:  Intact without significant lesions or rashes. Psych:  Alert and cooperative. Normal mood and affect.   Assessment   Marisa Bailey is a pleasant 81 y.o. female presenting today in follow-up with a history of COPD, hypertension, CHF, diabetes, anemia and pulmonary embolism on Xarelto, who returns in hospital follow-up after admission for acute blood loss anemia due to UGI bleed.   UGI bleed: secondary to Dieulafoy lesion s/p bleeding control therapy with cautery, injection, and clip placement. She has had no further overt GI bleeding.   Acute blood loss anemia: Discharge Hgb 8.9, currently not on iron as prescribed.   Appropriate to resume Xarelto; however, she seems to have some confusion regarding taking this. I have reached out to Dr. Posey Pronto. Encouraged her to also pick up iron to take daily. She will also start pantoprazole daily.   I am reaching out to Dr. Posey Pronto also regarding potential [facility] referral to assist with continuity of care.     PLAN   Start pantoprazole daily Start iron: at pharmacy May resume Xarelto Return in 3 months CBC today   Annitta Needs, PhD, [facility] Gastroenterology

## 2021-11-28 NOTE — Patient Instructions (Addendum)
Please have blood work done today.  Iron is at your pharmacy to take once daily.  I sent in pantoprazole to take once daily, 30 minutes before breakfast!  We will see you back in 3 months!  I enjoyed seeing you again today! As you know, I value our relationship and want to provide genuine, compassionate, and quality care. I welcome your feedback. If you receive a survey regarding your visit,  I greatly appreciate you taking time to fill this out. See you next time!  Annitta Needs, PhD, ANP-BC [facility] Gastroenterology

## 2021-11-28 NOTE — Addendum Note (Signed)
Addended by: Cheron Every on: 11/28/2021 09:46 AM   Modules accepted: Orders

## 2021-11-29 ENCOUNTER — Telehealth: Payer: Self-pay

## 2021-11-29 NOTE — Telephone Encounter (Signed)
Pt requested a call from you regarding her Pantoprazole. I advised her of the instructions and she states that she has 40 pills at home. I advised the pt if you are taking them X 2 a day it is only a 20 day supply. Pt isn't understanding what I am telling her because she advises that her PCP told her to stop taking Pantoprazole and several of her other medications. I asked her why she doesn't know why. Pt is requesting to speak with you.

## 2021-11-30 ENCOUNTER — Other Ambulatory Visit: Payer: Self-pay | Admitting: Internal Medicine

## 2021-11-30 DIAGNOSIS — J439 Emphysema, unspecified: Secondary | ICD-10-CM

## 2021-11-30 DIAGNOSIS — J470 Bronchiectasis with acute lower respiratory infection: Secondary | ICD-10-CM

## 2021-11-30 DIAGNOSIS — J9611 Chronic respiratory failure with hypoxia: Secondary | ICD-10-CM

## 2021-11-30 MED ORDER — IPRATROPIUM-ALBUTEROL 0.5-2.5 (3) MG/3ML IN SOLN: RESPIRATORY_TRACT | 0 refills | Status: DC

## 2021-12-03 ENCOUNTER — Encounter: Payer: Self-pay | Admitting: Internal Medicine

## 2021-12-03 ENCOUNTER — Ambulatory Visit (INDEPENDENT_AMBULATORY_CARE_PROVIDER_SITE_OTHER): Payer: Medicare Other | Admitting: Internal Medicine

## 2021-12-03 DIAGNOSIS — Z0000 Encounter for general adult medical examination without abnormal findings: Secondary | ICD-10-CM

## 2021-12-03 NOTE — Progress Notes (Signed)
Subjective:  This is a telephone encounter between Marisa Bailey and Marisa Bailey on 12/03/2021 for [place]. The visit was conducted with the patient located at home and Marisa Bailey at [facility]. The patient's identity was confirmed using their DOB and current address. The patient has consented to being evaluated through a telephone encounter and understands the associated risks (an examination cannot be done and the patient may need to come in for an appointment) / benefits (allows the patient to remain at home, decreasing exposure to coronavirus).    Marisa Bailey is a 81 y.o. female who presents for an Initial Medicare Annual Wellness Visit.  Review of Systems    Review of Systems  All other systems reviewed and are negative.   Objective:    Today's Vitals   12/03/21 0942  PainSc: 0-No pain   There is no height or weight on file to calculate BMI.     12/03/2021    9:45 AM 10/25/2021    1:28 PM 10/24/2021    7:00 PM 10/24/2021    2:13 PM 09/24/2021   12:27 PM 09/18/2021   12:20 PM 08/21/2021    1:25 AM  Advanced Directives  Does Patient Have a Medical Advance Directive? Yes _0  No  Does patient want to make changes to medical advance directive? Yes (MAU/Ambulatory/Procedural Areas - Information given)        Would patient like information on creating a medical advance directive?  No - Patient declined  No - Patient declined No - Patient declined No - Patient declined No - Patient declined    Current Medications (verified) Outpatient Encounter Medications as of 12/03/2021  Medication Sig   albuterol (VENTOLIN HFA) 108 (90 Base) MCG/ACT inhaler Inhale 2 puffs into the lungs every 6 (six) hours as needed for wheezing or shortness of breath.   amLODipine (NORVASC) 10 MG tablet Take 1 tablet by mouth once daily   atorvastatin (LIPITOR) 20 MG tablet TAKE 1 TABLET BY MOUTH DAILY   Cholecalciferol (VITAMIN D3) 25 MCG (1000 UT) CAPS Take 1 capsule by mouth daily.   folic acid  (FOLVITE) 1 MG tablet Take 1 tablet by mouth once daily   ipratropium-albuterol (DUONEB) 0.5-2.5 (3) MG/3ML SOLN Take 3 mLs by nebulization every 4 (four) hours or as needed for shortness of breath or wheezing.   metFORMIN (GLUCOPHAGE) 500 MG tablet Take 1 tablet (500 mg total) by mouth 2 (two) times daily.   pantoprazole (PROTONIX) 40 MG tablet Take 1 tablet (40 mg total) by mouth daily. 30 minutes before breakfast   prednisoLONE acetate (PRED FORTE) 1 % ophthalmic suspension 1 drop 2 (two) times daily.   zinc gluconate 50 MG tablet Take 50 mg by mouth daily.   [DISCONTINUED] ILEVRO 0.3 % ophthalmic suspension 1 drop daily.   [DISCONTINUED] Iron-FA-B Cmp-C-Biot-Probiotic (FUSION PLUS) CAPS Take 1 tablet by mouth daily. (Patient not taking: Reported on 11/28/2021)   [DISCONTINUED] moxifloxacin (VIGAMOX) 0.5 % ophthalmic solution Apply 1 drop to eye 3 (three) times daily.   [DISCONTINUED] rivaroxaban (XARELTO) 20 MG TABS tablet Take 1 tablet (20 mg total) by mouth daily with supper. (Patient not taking: Reported on 11/28/2021)   No facility-administered encounter medications on file as of 12/03/2021.    Allergies (verified) Penicillins   History: Past Medical History:  Diagnosis Date   Aspiration pneumonia ([place]) 11/03/2020   Blind right eye    CHF (congestive heart failure) (H[facility])    COPD (chronic obstructive pulmonary disease) ([place])  Diabetes mellitus without complication ([place])    Fx upper humerus-closed 08/17/2012   H/O blood clots    lungs   Hypertension    Past Surgical History:  Procedure Laterality Date   CATARACT EXTRACTION W/PHACO Left 09/24/2021   Procedure: CATARACT EXTRACTION PHACO AND INTRAOCULAR LENS PLACEMENT (IOC);  Surgeon: Baruch Goldmann, MD;  Location: [facility];  Service: Ophthalmology;  Laterality: Left;  CDE: 8.08   ENUCLEATION     ESOPHAGOGASTRODUODENOSCOPY (EGD) WITH PROPOFOL N/A 10/25/2021   Procedure: ESOPHAGOGASTRODUODENOSCOPY (EGD) WITH PROPOFOL;  Surgeon:  Harvel Quale, MD;  Location: [facility];  Service: Gastroenterology;  Laterality: N/A;   HEMOSTASIS CLIP PLACEMENT  10/25/2021   Procedure: HEMOSTASIS CLIP PLACEMENT;  Surgeon: Harvel Quale, MD;  Location: [facility];  Service: Gastroenterology;;   HOT HEMOSTASIS  10/25/2021   Procedure: HOT HEMOSTASIS (ARGON PLASMA COAGULATION/BICAP);  Surgeon: Montez Morita, Quillian Quince, MD;  Location: [facility];  Service: Gastroenterology;;   SUBMUCOSAL INJECTION  10/25/2021   Procedure: SUBMUCOSAL INJECTION;  Surgeon: Montez Morita, Quillian Quince, MD;  Location: [facility];  Service: Gastroenterology;;   Family History  Problem Relation Age of Onset   Diabetes Mother    Social History   Socioeconomic History   Marital status: Single    Spouse name: Not on file   Number of children: Not on file   Years of education: Not on file   Highest education level: Not on file  Occupational History   Not on file  Tobacco Use   Smoking status: Every Day    Packs/day: 1.00    Years: 18.00    Total pack years: 18.00    Types: Cigarettes   Smokeless tobacco: Never  Vaping Use   Vaping Use: Never used  Substance and Sexual Activity   Alcohol use: Not Currently    Comment: rarely   Drug use: No   Sexual activity: Not on file  Other Topics Concern   Not on file  Social History Narrative   Retired (from [employer]). No children, never been married. Has 8 siblings (5 are deceased).   Social Determinants of Health   Financial Resource Strain: Low Risk  (11/30/2020)   Overall Financial Resource Strain (CARDIA)    Difficulty of Paying Living Expenses: Not hard at all  Food Insecurity: No Food Insecurity (10/24/2021)   Hunger Vital Sign    Worried About Running Out of Food in the Last Year: Never true    Ran Out of Food in the Last Year: Never true  Transportation Needs: No Transportation Needs (10/24/2021)   PRAPARE - Hydrologist (Medical): No     Lack of Transportation (Non-Medical): No  Physical Activity: Sufficiently Active (11/30/2020)   Exercise Vital Sign    Days of Exercise per Week: 7 days    Minutes of Exercise per Session: 30 min  Stress: No Stress Concern Present (11/30/2020)   [place]    Feeling of Stress : Not at all  Social Connections: Moderately Isolated (11/30/2020)   Social Connection and Isolation Panel [NHANES]    Frequency of Communication with Friends and Family: More than three times a week    Frequency of Social Gatherings with Friends and Family: More than three times a week    Attends Religious Services: 1 to 4 times per year    Active Member of [employer] or Organizations: No    Attends Archivist Meetings: Never  Marital Status: Never married    Tobacco Counseling Ready to quit: Not Answered Counseling given: Not Answered   Clinical Intake:  Pre-visit preparation completed: Yes  Pain : No/denies pain Pain Score: 0-No pain     Nutritional Status: BMI of 19-24  Normal  How often do you need to have someone help you when you read instructions, pamphlets, or other written materials from your doctor or pharmacy?: 1 - Never What is the last grade level you completed in school?: 12 grade  Diabetic?Yes    Activities of Daily Living    12/03/2021    9:47 AM 10/24/2021    7:00 PM  In your present state of health, do you have any difficulty performing the following activities:  Hearing? 0 0  Vision? 0 0  Difficulty concentrating or making decisions? 0 1  Walking or climbing stairs? 0 1  Dressing or bathing? 0 0  Doing errands, shopping? 0 0    Patient Care Team: [name], MD as PCP - General (Internal Medicine) Donato Heinz, MD as PCP - Cardiology (Cardiology)  Indicate any recent Medical Services you may have received from other than [facility] providers in the past year (date may be  approximate).     Assessment:   This is a routine wellness examination for Marisa Bailey.  Hearing/Vision screen No results found.  Dietary issues and exercise activities discussed:     Goals Addressed   None    Depression Screen    12/03/2021    9:47 AM 10/30/2021   10:36 AM 09/11/2021   10:02 AM 03/12/2021   10:07 AM 11/30/2020    9:33 AM 11/30/2020    9:28 AM 11/10/2020    9:02 AM  PHQ 2/9 Scores  PHQ - 2 Score 0 0 0 0 0 0 0    Fall Risk    12/03/2021    9:46 AM 10/30/2021   10:36 AM 09/11/2021   10:02 AM 03/12/2021   10:07 AM 11/30/2020    9:33 AM  Fall Risk   Falls in the past year? 1 0 1 0 0  Number falls in past yr: 0 0 0 0 0  Injury with Fall? 0 0 0 0 0  Risk for fall due to :  No Fall Risks History of fall(s);Impaired balance/gait No Fall Risks No Fall Risks  Follow up  Falls evaluation completed Falls evaluation completed;Education provided;Falls prevention discussed Falls evaluation completed Falls evaluation completed    FALL RISK PREVENTION PERTAINING TO THE HOME:  Any stairs in or around the home? Yes  If so, are there any without handrails? Yes  Home free of loose throw rugs in walkways, pet beds, electrical cords, etc? Yes  Adequate lighting in your home to reduce risk of falls? Yes   ASSISTIVE DEVICES UTILIZED TO PREVENT FALLS:  Life alert? No  Use of a cane, walker or w/c? No  Grab bars in the bathroom? No  Shower chair or bench in shower? No  Elevated toilet seat or a handicapped toilet? Yes     Cognitive Function:    11/30/2020    9:34 AM  MMSE - Mini Mental State Exam  Not completed: Unable to complete        11/30/2020    9:34 AM  6CIT Screen  What Year? 0 points  What month? 0 points  What time? 0 points  Count back from 20 0 points  Months in reverse 0 points  Repeat phrase 2 points  Total  Score 2 points    Immunizations Immunization History  Administered Date(s) Administered   Moderna SARS-COV2 Booster Vaccination 01/26/2021    Moderna Sars-Covid-2 Vaccination 03/21/2019, 04/21/2019   PNEUMOCOCCAL CONJUGATE-20 09/11/2021    TDAP status: Due, Education has been provided regarding the importance of this vaccine. Advised may receive this vaccine at local pharmacy or Health Dept. Aware to provide a copy of the vaccination record if obtained from local pharmacy or Health Dept. Verbalized acceptance and understanding.  Flu Vaccine status: Up to date  Pneumococcal vaccine status: Up to date  Covid-19 vaccine status: Information provided on how to obtain vaccines.   Qualifies for Shingles Vaccine? Yes   Zostavax completed No   Shingrix Completed?: No.    Education has been provided regarding the importance of this vaccine. Patient has been advised to call insurance company to determine out of pocket expense if they have not yet received this vaccine. Advised may also receive vaccine at local pharmacy or Health Dept. Verbalized acceptance and understanding.  Screening Tests Health Maintenance  Topic Date Due   Diabetic kidney evaluation - Urine ACR  Never done   TETANUS/TDAP  Never done   Zoster Vaccines- Shingrix (1 of 2) Never done   COVID-19 Vaccine (3 - Moderna risk series) 02/23/2021   INFLUENZA VACCINE  05/12/2022 (Originally 09/11/2021)   Diabetic kidney evaluation - GFR measurement  11/07/2022   Pneumonia Vaccine 6+ Years old  Completed   DEXA SCAN  Completed   HPV VACCINES  Aged Out    Health Maintenance  Health Maintenance Due  Topic Date Due   Diabetic kidney evaluation - Urine ACR  Never done   TETANUS/TDAP  Never done   Zoster Vaccines- Shingrix (1 of 2) Never done   COVID-19 Vaccine (3 - Moderna risk series) 02/23/2021    Colorectal cancer screening: No longer required.   Mammogram status: No longer required due to age.  Bone Density status: Completed 12/06/20. Results reflect: Bone density results: OSTEOPENIA.   Lung Cancer Screening: (Low Dose CT Chest recommended if Age 16-80 years,  30 pack-year currently smoking OR have quit w/in 15years.) does qualify.   Lung Cancer Screening Referral: Declined.  Additional Screening:  Hepatitis C Screening: does not qualify  Vision Screening: Recommended annual ophthalmology exams for early detection of glaucoma and other disorders of the eye. Is the patient up to date with their annual eye exam?  Yes  Who is the provider or what is the name of the office in which the patient attends annual eye exams? [facility] If pt is not established with a provider, would they like to be referred to a provider to establish care? No .   Dental Screening: Recommended annual dental exams for proper oral hygiene  Community Resource Referral / Chronic Care Management: CRR required this visit?  No   CCM required this visit?  No      Plan:     I have personally reviewed and noted the following in the patient's chart:   Medical and social history Use of alcohol, tobacco or illicit drugs  Current medications and supplements including opioid prescriptions. Patient is not currently taking opioid prescriptions. Functional ability and status Nutritional status Physical activity Advanced directives List of other physicians Hospitalizations, surgeries, and ER visits in previous 12 months Vitals Screenings to include cognitive, depression, and falls Referrals and appointments  In addition, I have reviewed and discussed with patient certain preventive protocols, quality metrics, and best practice recommendations. A written personalized  care plan for preventive services as well as general preventive health recommendations were provided to patient.     Marisa Dy, MD   12/03/2021

## 2021-12-03 NOTE — Assessment & Plan Note (Signed)
CT chest showed bronchiectasis Has had frequent episodes of CAP and/or aspiration pneumonia requiring antibiotic therapy Chronic productive cough for many years She has tried Acapella and chest PT (during hospitalization) - Acapella does not help much and patient is not a reliable candidate for Acapella use without assistance Will try to get Afflowest

## 2021-12-03 NOTE — Patient Instructions (Signed)
  Marisa Bailey , Thank you for taking time to come for your Medicare Wellness Visit. I appreciate your ongoing commitment to your health goals. Please review the following plan we discussed and let me know if I can assist you in the future.   These are the goals we discussed:  Goals      Patient Stated     Would like to travel more         This is a list of the screening recommended for you and due dates:  Health Maintenance  Topic Date Due   Yearly kidney health urinalysis for diabetes  Never done   Tetanus Vaccine  Never done   Zoster (Shingles) Vaccine (1 of 2) Never done   COVID-19 Vaccine (3 - Moderna risk series) 02/23/2021   Flu Shot  05/12/2022*   Yearly kidney function blood test for diabetes  11/07/2022   Pneumonia Vaccine  Completed   DEXA scan (bone density measurement)  Completed   HPV Vaccine  Aged Out  *Topic was postponed. The date shown is not the original due date.

## 2021-12-06 DIAGNOSIS — J439 Emphysema, unspecified: Secondary | ICD-10-CM | POA: Diagnosis not present

## 2021-12-06 DIAGNOSIS — J479 Bronchiectasis, uncomplicated: Secondary | ICD-10-CM | POA: Diagnosis not present

## 2021-12-06 DIAGNOSIS — J470 Bronchiectasis with acute lower respiratory infection: Secondary | ICD-10-CM | POA: Diagnosis not present

## 2021-12-10 ENCOUNTER — Other Ambulatory Visit: Payer: Self-pay | Admitting: *Deleted

## 2021-12-10 DIAGNOSIS — J479 Bronchiectasis, uncomplicated: Secondary | ICD-10-CM

## 2021-12-12 DIAGNOSIS — J410 Simple chronic bronchitis: Secondary | ICD-10-CM | POA: Diagnosis not present

## 2021-12-13 ENCOUNTER — Ambulatory Visit (INDEPENDENT_AMBULATORY_CARE_PROVIDER_SITE_OTHER): Payer: Medicare Other | Admitting: Internal Medicine

## 2021-12-13 ENCOUNTER — Encounter: Payer: Self-pay | Admitting: Internal Medicine

## 2021-12-13 VITALS — BP 108/58 | HR 88 | Resp 18 | Ht 64.0 in | Wt 133.6 lb

## 2021-12-13 DIAGNOSIS — J9611 Chronic respiratory failure with hypoxia: Secondary | ICD-10-CM

## 2021-12-13 DIAGNOSIS — I5032 Chronic diastolic (congestive) heart failure: Secondary | ICD-10-CM

## 2021-12-13 DIAGNOSIS — R7303 Prediabetes: Secondary | ICD-10-CM

## 2021-12-13 DIAGNOSIS — Z720 Tobacco use: Secondary | ICD-10-CM

## 2021-12-13 DIAGNOSIS — J470 Bronchiectasis with acute lower respiratory infection: Secondary | ICD-10-CM

## 2021-12-13 DIAGNOSIS — I2694 Multiple subsegmental pulmonary emboli without acute cor pulmonale: Secondary | ICD-10-CM

## 2021-12-13 DIAGNOSIS — I10 Essential (primary) hypertension: Secondary | ICD-10-CM

## 2021-12-13 MED ORDER — RIVAROXABAN 20 MG PO TABS: 20.0000 mg | ORAL_TABLET | Freq: Every day | ORAL | 0 refills | Status: DC

## 2021-12-13 NOTE — Assessment & Plan Note (Signed)
Takes metformin 500 mg twice daily

## 2021-12-13 NOTE — Patient Instructions (Signed)
Please start taking Xarelto as prescribed for 1 month.  Please get fasting blood tests done after 1 month.  Please bring your home medications in the next visit.  Please try to cut down -> quit smoking.

## 2021-12-13 NOTE — Assessment & Plan Note (Signed)
Was admitted for PE Stopped taking Eliquis due to rash Had switched to Xarelto 20 mg QD, needs to restart taking it now Emphasized importance of [facility]

## 2021-12-13 NOTE — Assessment & Plan Note (Signed)
Smokes 1 pack/day ° °Asked about quitting: confirms that she currently smokes cigarettes °Advise to quit smoking: Educated about QUITTING to reduce the risk of cancer, cardio and cerebrovascular disease. °Assess willingness: Unwilling to quit at this time, but is working on cutting back. °Assist with counseling and pharmacotherapy: Counseled for 5 minutes and literature provided. °Arrange for follow up: Follow up in 3 months and continue to offer help. °

## 2021-12-13 NOTE — Assessment & Plan Note (Signed)
CT chest showed bronchiectasis Has had frequent episodes of CAP and/or aspiration pneumonia requiring antibiotic therapy Chronic productive cough for many years She has tried Acapella and chest PT (during hospitalization) - Acapella does not help much and patient is not a reliable candidate for Acapella use without assistance Will try to get Afflowest

## 2021-12-13 NOTE — Progress Notes (Signed)
Established Patient Office Visit  Subjective:  Patient ID: Marisa Bailey, female    DOB: Jan 12, 1941  Age: 81 y.o. MRN: [id number]  CC:  Chief Complaint  Patient presents with   Follow-up    Follow up COPD and PE breathing is good     HPI Marisa Bailey is a 81 y.o. female with past medical history of HTN, HFpEF, COPD with chronic hypoxic respiratory failure and tobacco abuse who presents for f/u of her chronic medical conditions.  HTN: She takes Amlodipine.  She denies any chest pain or palpitations currently.  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  She denies any LE swelling currently.  COPD with chronic hypoxic respiratory failure: She has seen Dr. Halford Chessman for COPD.  She currently uses albuterol nebulizer as needed for dyspnea or wheezing.  She still smokes about a pack per day and does not want to quit anytime soon.  She has home O2 for chronic hypoxic respiratory failure, which she uses at nighttime and as needed for dyspnea.  She denies any dyspnea or wheezing currently.  PE: She had PE in 07/23.  She was initially placed on Eliquis, but had an allergic reaction to it.  She was later switched to Xarelto, but had severe upper GI bleeding with it.  She had EGD and was later advised to resume Xarelto by GI.  Her Hb had been improving.  She has not started taking Xarelto yet.  She had follow-up visit with GI, in which she was advised to continue pantoprazole for recent episode of UGIB and was also advised to start taking Xarelto.     Past Medical History:  Diagnosis Date   Aspiration pneumonia ([place]) 11/03/2020   Blind right eye    CHF (congestive heart failure) (HCC)    COPD (chronic obstructive pulmonary disease) (HCC)    Diabetes mellitus without complication ([place])    Fx upper humerus-closed 08/17/2012   H/O blood clots    lungs   Hypertension     Past Surgical History:  Procedure Laterality Date   CATARACT EXTRACTION W/PHACO Left 09/24/2021    Procedure: CATARACT EXTRACTION PHACO AND INTRAOCULAR LENS PLACEMENT ([place]);  Surgeon: Baruch Goldmann, MD;  Location: [facility];  Service: Ophthalmology;  Laterality: Left;  CDE: 8.08   ENUCLEATION     ESOPHAGOGASTRODUODENOSCOPY (EGD) WITH PROPOFOL N/A 10/25/2021   Procedure: ESOPHAGOGASTRODUODENOSCOPY (EGD) WITH PROPOFOL;  Surgeon: Harvel Quale, MD;  Location: [facility];  Service: Gastroenterology;  Laterality: N/A;   HEMOSTASIS CLIP PLACEMENT  10/25/2021   Procedure: HEMOSTASIS CLIP PLACEMENT;  Surgeon: Harvel Quale, MD;  Location: [facility];  Service: Gastroenterology;;   HOT HEMOSTASIS  10/25/2021   Procedure: HOT HEMOSTASIS (ARGON PLASMA COAGULATION/BICAP);  Surgeon: Montez Morita, Quillian Quince, MD;  Location: [facility];  Service: Gastroenterology;;   SUBMUCOSAL INJECTION  10/25/2021   Procedure: SUBMUCOSAL INJECTION;  Surgeon: Harvel Quale, MD;  Location: [facility];  Service: Gastroenterology;;    Family History  Problem Relation Age of Onset   Diabetes Mother     Social History   Socioeconomic History   Marital status: Single    Spouse name: Not on file   Number of children: Not on file   Years of education: Not on file   Highest education level: Not on file  Occupational History   Not on file  Tobacco Use   Smoking status: Every Day    Packs/day: 1.00    Years: 18.00  Total pack years: 18.00    Types: Cigarettes   Smokeless tobacco: Never  Vaping Use   Vaping Use: Never used  Substance and Sexual Activity   Alcohol use: Not Currently    Comment: rarely   Drug use: No   Sexual activity: Not on file  Other Topics Concern   Not on file  Social History Narrative   Retired (from [employer]). No children, never been married. Has 8 siblings (5 are deceased).   Social Determinants of Health   Financial Resource Strain: Low Risk  (11/30/2020)   Overall Financial Resource Strain (CARDIA)    Difficulty of Paying Living Expenses:  Not hard at all  Food Insecurity: No Food Insecurity (10/24/2021)   Hunger Vital Sign    Worried About Running Out of Food in the Last Year: Never true    Ran Out of Food in the Last Year: Never true  Transportation Needs: No Transportation Needs (10/24/2021)   PRAPARE - Hydrologist (Medical): No    Lack of Transportation (Non-Medical): No  Physical Activity: Sufficiently Active (11/30/2020)   Exercise Vital Sign    Days of Exercise per Week: 7 days    Minutes of Exercise per Session: 30 min  Stress: No Stress Concern Present (11/30/2020)   [place]    Feeling of Stress : Not at all  Social Connections: Moderately Isolated (11/30/2020)   Social Connection and Isolation Panel [NHANES]    Frequency of Communication with Friends and Family: More than three times a week    Frequency of Social Gatherings with Friends and Family: More than three times a week    Attends Religious Services: 1 to 4 times per year    Active Member of [employer] or Organizations: No    Attends Archivist Meetings: Never    Marital Status: Never married  Intimate Partner Violence: Not At Risk (10/24/2021)   Humiliation, Afraid, Rape, and Kick questionnaire    Fear of Current or Ex-Partner: No    Emotionally Abused: No    Physically Abused: No    Sexually Abused: No    Outpatient Medications Prior to Visit  Medication Sig Dispense Refill   albuterol (VENTOLIN HFA) 108 (90 Base) MCG/ACT inhaler Inhale 2 puffs into the lungs every 6 (six) hours as needed for wheezing or shortness of breath. 8 g 2   amLODipine (NORVASC) 10 MG tablet Take 1 tablet by mouth once daily 90 tablet 0   atorvastatin (LIPITOR) 20 MG tablet TAKE 1 TABLET BY MOUTH DAILY 90 tablet 3   Cholecalciferol (VITAMIN D3) 25 MCG (1000 UT) CAPS Take 1 capsule by mouth daily.     folic acid (FOLVITE) 1 MG tablet Take 1 tablet by mouth once daily 90  tablet 0   ipratropium-albuterol (DUONEB) 0.5-2.5 (3) MG/3ML SOLN Take 3 mLs by nebulization every 4 (four) hours or as needed for shortness of breath or wheezing. 540 mL 0   metFORMIN (GLUCOPHAGE) 500 MG tablet Take 1 tablet (500 mg total) by mouth 2 (two) times daily. 180 tablet 1   pantoprazole (PROTONIX) 40 MG tablet Take 1 tablet (40 mg total) by mouth daily. 30 minutes before breakfast 90 tablet 3   prednisoLONE acetate (PRED FORTE) 1 % ophthalmic suspension 1 drop 2 (two) times daily.     zinc gluconate 50 MG tablet Take 50 mg by mouth daily.     No facility-administered medications prior to visit.  Allergies  Allergen Reactions   Penicillins Rash    Has patient had a PCN reaction causing immediate rash, facial/tongue/throat swelling, SOB or lightheadedness with hypotension: {Yes/No:[id number] Has patient had a PCN reaction causing severe rash involving mucus membranes or skin necrosis: Yes Has patient had a PCN reaction that required hospitalization No Has patient had a PCN reaction occurring within the last 10 years: No If all of the above answers are "NO", then may proceed with Cephalosporin use.  **Has tolerated keflex     ROS Review of Systems  Constitutional:  Negative for chills and fever.  HENT:  Negative for congestion, sinus pressure and sore throat.   Eyes:  Negative for pain and discharge.       Right eye blindness  Respiratory:  Positive for cough and shortness of breath.   Cardiovascular:  Negative for chest pain and palpitations.  Gastrointestinal:  Negative for abdominal pain, diarrhea, nausea and vomiting.  Endocrine: Negative for polydipsia and polyuria.  Genitourinary:  Negative for dysuria and hematuria.  Musculoskeletal:  Negative for neck pain and neck stiffness.  Skin:  Negative for rash.  Neurological:  Negative for dizziness and weakness.  Psychiatric/Behavioral:  Negative for agitation and behavioral problems.       Objective:    Physical  Exam Vitals reviewed.  Constitutional:      General: She is not in acute distress.    Appearance: She is not diaphoretic.  HENT:     Head: Normocephalic and atraumatic.     Mouth/Throat:     Mouth: Mucous membranes are moist.  Eyes:     General: No scleral icterus.    Comments: Right eye blindness  Cardiovascular:     Rate and Rhythm: Normal rate and regular rhythm.     Pulses: Normal pulses.     Heart sounds: Normal heart sounds. No murmur heard. Pulmonary:     Breath sounds: Normal breath sounds. No wheezing or rales.  Musculoskeletal:     Cervical back: Neck supple. No tenderness.     Right lower leg: No edema.     Left lower leg: No edema.  Skin:    General: Skin is warm.     Findings: No rash.  Neurological:     General: No focal deficit present.     Mental Status: She is alert and oriented to person, place, and time.     Sensory: No sensory deficit.     Motor: No weakness.  Psychiatric:        Mood and Affect: Mood normal.        Behavior: Behavior normal.     BP (!) 108/58 (BP Location: Left Arm, Patient Position: Sitting, Cuff Size: Normal)   Pulse 88   Resp 18   Ht _0  (1.626 m)   Wt 133 lb 9.6 oz (60.6 kg)   SpO2 (!) 87% Comment: patient has oxygen at home but does not wear it  BMI 22.93 kg/m  Wt Readings from Last 3 Encounters:  12/13/21 133 lb 9.6 oz (60.6 kg)  11/28/21 137 lb (62.1 kg)  10/30/21 141 lb (64 kg)    Lab Results  Component Value Date   TSH 0.608 09/11/2021   Lab Results  Component Value Date   WBC 6.5 11/28/2021   HGB 9.2 (L) 11/28/2021   HCT 30.8 (L) 11/28/2021   MCV 80.4 11/28/2021   PLT 331 11/28/2021   Lab Results  Component Value Date   NA 141 11/06/2021   K 4.5  11/06/2021   CO2 24 11/06/2021   GLUCOSE 101 (H) 11/06/2021   BUN 9 11/06/2021   CREATININE 0.78 11/06/2021   BILITOT 0.7 09/11/2021   ALKPHOS 80 09/11/2021   AST 14 09/11/2021   ALT 8 09/11/2021   PROT 7.0 09/11/2021   ALBUMIN 4.2 09/11/2021    CALCIUM 9.0 11/06/2021   ANIONGAP 5 10/26/2021   EGFR 76 11/06/2021   Lab Results  Component Value Date   CHOL 133 09/11/2021   Lab Results  Component Value Date   HDL 54 09/11/2021   Lab Results  Component Value Date   LDLCALC 62 09/11/2021   Lab Results  Component Value Date   TRIG 88 09/11/2021   Lab Results  Component Value Date   CHOLHDL 2.5 09/11/2021   Lab Results  Component Value Date   HGBA1C 5.9 (H) 09/11/2021      Assessment & Plan:   Problem List Items Addressed This Visit       Cardiovascular and Mediastinum   Essential hypertension - Primary    BP Readings from Last 1 Encounters:  12/13/21 (!) 108/58  Well-controlled with amlodipine olmesartan recently discontinued due to low BP during hospitalization Counseled for compliance with the medications Advised DASH diet and moderate exercise/walking as tolerated      Relevant Medications   rivaroxaban (XARELTO) 20 MG TABS tablet   Other Relevant Orders   CBC with Differential/Platelet   CMP14+EGFR   (HFpEF) heart failure with preserved ejection fraction ([place])    Appears euvolemic currently Not on any diuretic      Relevant Medications   rivaroxaban (XARELTO) 20 MG TABS tablet   Pulmonary emboli (HCC)    Was admitted for PE Stopped taking Eliquis due to rash Had switched to Xarelto 20 mg QD, needs to restart taking it now Emphasized importance of AC      Relevant Medications   rivaroxaban (XARELTO) 20 MG TABS tablet     Respiratory   Chronic respiratory failure with hypoxia (HCC)    Due to COPD Has home O2 -uses at nighttime and as needed for dyspnea Prescribed portable O2 for easier use      Bronchiectasis with acute lower respiratory infection ([place])    CT chest showed bronchiectasis Has had frequent episodes of CAP and/or aspiration pneumonia requiring antibiotic therapy Chronic productive cough for many years She has tried Acapella and chest PT (during hospitalization) - Acapella  does not help much and patient is not a reliable candidate for Acapella use without assistance Will try to get Afflowest        Other   Tobacco abuse    Smokes 1 pack/day  Asked about quitting: confirms that she currently smokes cigarettes Advise to quit smoking: Educated about QUITTING to reduce the risk of cancer, cardio and cerebrovascular disease. Assess willingness: Unwilling to quit at this time, but is working on cutting back. Assist with counseling and pharmacotherapy: Counseled for 5 minutes and literature provided. Arrange for follow up: Follow up in 3 months and continue to offer help.      Prediabetes    Takes metformin 500 mg twice daily      Relevant Orders   Hemoglobin A1c   CMP14+EGFR   Microalbumin / creatinine urine ratio    Meds ordered this encounter  Medications   rivaroxaban (XARELTO) 20 MG TABS tablet    Sig: Take 1 tablet (20 mg total) by mouth daily with supper.    Dispense:  30 tablet  Refill:  0    Follow-up: Return in about 6 weeks (around 01/24/2022) for COPD and med review.    [name], MD

## 2021-12-13 NOTE — Assessment & Plan Note (Signed)
Due to COPD Has home O2 -uses at nighttime and as needed for dyspnea Prescribed portable O2 for easier use

## 2021-12-13 NOTE — Assessment & Plan Note (Signed)
Appears euvolemic currently Not on any diuretic

## 2021-12-13 NOTE — Assessment & Plan Note (Addendum)
Lab Results  Component Value Date   HGBA1C 5.9 (H) 09/11/2021   Well controlled On metformin Advised to follow diabetic diet On statin F/u CMP and lipid panel Diabetic eye exam: Advised to follow up with Ophthalmology for diabetic eye exam

## 2021-12-13 NOTE — Assessment & Plan Note (Signed)
BP Readings from Last 1 Encounters:  12/13/21 (!) 108/58   Well-controlled with amlodipine olmesartan recently discontinued due to low BP during hospitalization Counseled for compliance with the medications Advised DASH diet and moderate exercise/walking as tolerated

## 2021-12-18 DIAGNOSIS — J449 Chronic obstructive pulmonary disease, unspecified: Secondary | ICD-10-CM | POA: Diagnosis not present

## 2021-12-19 ENCOUNTER — Telehealth: Payer: Self-pay

## 2021-12-19 NOTE — Progress Notes (Signed)
   Care Guide Note  12/19/2021 Name: Marisa Bailey MRN: [id number] DOB: [birth year]  Referred by: [name], MD Reason for referral : Care Coordination (Outreach to schedule with Pharm d Grayland Ormond)   Marisa Bailey is a 81 y.o. year old female who is a primary care patient of Posey Pronto, Colin Broach, MD. Marisa Bailey was referred to the pharmacist for assistance related to DM.    An unsuccessful telephone outreach was attempted today to contact the patient who was referred to the pharmacy team for assistance with medication management. Additional attempts will be made to contact the patient.   Noreene Larsson, [place], [place] [address] Direct Dial: [phone] [email or web address]

## 2021-12-25 ENCOUNTER — Encounter: Payer: Self-pay | Admitting: Pulmonary Disease

## 2021-12-25 ENCOUNTER — Ambulatory Visit (INDEPENDENT_AMBULATORY_CARE_PROVIDER_SITE_OTHER): Payer: Medicare Other | Admitting: Pulmonary Disease

## 2021-12-25 VITALS — BP 118/68 | HR 98 | Temp 98.0°F | Ht 64.0 in | Wt 134.4 lb

## 2021-12-25 DIAGNOSIS — J479 Bronchiectasis, uncomplicated: Secondary | ICD-10-CM | POA: Diagnosis not present

## 2021-12-25 DIAGNOSIS — J4489 Other specified chronic obstructive pulmonary disease: Secondary | ICD-10-CM | POA: Diagnosis not present

## 2021-12-25 DIAGNOSIS — J439 Emphysema, unspecified: Secondary | ICD-10-CM | POA: Diagnosis not present

## 2021-12-25 MED ORDER — IPRATROPIUM-ALBUTEROL 0.5-2.5 (3) MG/3ML IN SOLN: 3.0000 mL | RESPIRATORY_TRACT | Status: DC | PRN

## 2021-12-25 NOTE — Patient Instructions (Signed)
Follow up in 1 year.

## 2021-12-25 NOTE — Progress Notes (Signed)
[facility]  Chief Complaint  Patient presents with   Follow-up    Breathing doing well     Past Surgical History:  She  has a past surgical history that includes Enucleation; Cataract extraction w/PHACO (Left, 09/24/2021); Esophagogastroduodenoscopy (egd) with propofol (N/A, 10/25/2021); Hot hemostasis (10/25/2021); submucosal injection (10/25/2021); and Hemostasis clip placement (10/25/2021).  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 female smoker with COPD and bronchiectasis.      Subjective:   She is still smoking and not interesting in quitting.  She is 81 yrs old and still "dancing the mash potato".    She was found to have a PE in July.  She uses her nebulizer every 4 hours because she thought she had to.  She doesn't feel like she needs it this often.  Has occasional cough with clear sputum.  Not having fever, wheeze, chest pain, hemoptysis, or leg swelling.  Physical Exam:   Appearance - well kempt  ENMT - no sinus tenderness, no oral exudate, no LAN, Mallampati 4 airway, no stridor, wears dentures  Respiratory - equal breath sounds bilaterally, no wheezing or rales  CV - s1s2 regular rate and rhythm, no murmurs  Ext - no clubbing, no edema  Skin - no rashes  Psych - normal mood and affect   Chest Imaging:  CT angio chest 01/10/19 >> b/l lower lobe BTX CT angio chest 11/02/20 >> atherosclerosis, patchy infiltrate and consolidation in bases  Cardiac Tests:  Echo 05/26/19 >> EF 50%, grade 1 DD, mild LVH, mod RV dysfx, RVSP 36.1 mmHg  Social History:  She  reports that she has been smoking cigarettes. She has a 18.00 pack-year smoking  history. She has never used smokeless tobacco. She reports that she does not currently use alcohol. She reports that she does not use drugs.  Family History:  Her family history includes Diabetes in her mother.      Assessment/Plan:   Tobacco abuse with reported history of COPD. - advised her that she can use duoneb or albuterol hfa every 4 hours as needed - she isn't interested in smoking cessation at this time  Nocturnal hypoxemia. - continue supplemental oxygen at night  Bronchiectasis. - prn mucinex  Pulmonary embolism from July 2023. - anticoagulation managed by her PCP  Time Spent Involved in Patient Care on Day of Examination:  27 minutes  Follow up:   Patient Instructions  Follow up in 1 year  Medication List:   Allergies as of 12/25/2021       Reactions   Penicillins Rash   Has patient had a PCN reaction causing immediate rash, facial/tongue/throat swelling, SOB or lightheadedness with hypotension: {Yes/No:[id number] Has patient had a PCN reaction causing severe rash involving mucus membranes or skin necrosis: Yes Has patient had a PCN reaction that required hospitalization No Has patient had a PCN reaction occurring within the last 10 years: No If all of the above answers are "NO", then may proceed with Cephalosporin use. **Has tolerated keflex        Medication List  Accurate as of December 25, 2021 12:08 PM. If you have any questions, ask your nurse or doctor.          albuterol 108 (90 Base) MCG/ACT inhaler Commonly known as: VENTOLIN HFA Inhale 2 puffs into the lungs every 6 (six) hours as needed for wheezing or shortness of breath.   amLODipine 10 MG tablet Commonly known as: NORVASC Take 1 tablet by mouth once daily   atorvastatin 20 MG tablet Commonly known as: LIPITOR TAKE 1 TABLET BY MOUTH DAILY   folic acid 1 MG tablet Commonly known as: FOLVITE Take 1 tablet by mouth once daily   ipratropium-albuterol 0.5-2.5 (3) MG/3ML  Soln Commonly known as: DUONEB Take 3 mLs by nebulization every 4 (four) hours as needed. What changed:  how much to take how to take this when to take this reasons to take this additional instructions Changed by: Chesley Mires, MD   metFORMIN 500 MG tablet Commonly known as: GLUCOPHAGE Take 1 tablet (500 mg total) by mouth 2 (two) times daily.   pantoprazole 40 MG tablet Commonly known as: PROTONIX Take 1 tablet (40 mg total) by mouth daily. 30 minutes before breakfast   prednisoLONE acetate 1 % ophthalmic suspension Commonly known as: PRED FORTE 1 drop 2 (two) times daily.   rivaroxaban 20 MG Tabs tablet Commonly known as: XARELTO Take 1 tablet (20 mg total) by mouth daily with supper.   Vitamin D3 25 MCG (1000 UT) Caps Take 1 capsule by mouth daily.   zinc gluconate 50 MG tablet Take 50 mg by mouth daily.        Signature:  Chesley Mires, MD [place] Pager - [phone] 12/25/2021, 12:08 PM

## 2021-12-27 DIAGNOSIS — J449 Chronic obstructive pulmonary disease, unspecified: Secondary | ICD-10-CM | POA: Diagnosis not present

## 2022-01-06 DIAGNOSIS — J479 Bronchiectasis, uncomplicated: Secondary | ICD-10-CM | POA: Diagnosis not present

## 2022-01-08 ENCOUNTER — Other Ambulatory Visit: Payer: Self-pay | Admitting: Internal Medicine

## 2022-01-09 ENCOUNTER — Encounter (HOSPITAL_COMMUNITY): Payer: Self-pay | Admitting: Family Medicine

## 2022-01-09 ENCOUNTER — Telehealth: Payer: Self-pay | Admitting: Internal Medicine

## 2022-01-09 ENCOUNTER — Other Ambulatory Visit: Payer: Self-pay

## 2022-01-09 ENCOUNTER — Emergency Department (HOSPITAL_COMMUNITY): Payer: Medicare Other

## 2022-01-09 ENCOUNTER — Inpatient Hospital Stay (HOSPITAL_COMMUNITY)
Admission: EM | Admit: 2022-01-09 | Discharge: 2022-01-14 | DRG: 291 | Disposition: A | Discharge status: Home Health | Payer: Medicare Other | Attending: Family Medicine | Admitting: Family Medicine

## 2022-01-09 DIAGNOSIS — I5032 Chronic diastolic (congestive) heart failure: Secondary | ICD-10-CM | POA: Diagnosis not present

## 2022-01-09 DIAGNOSIS — R0600 Dyspnea, unspecified: Secondary | ICD-10-CM | POA: Diagnosis not present

## 2022-01-09 DIAGNOSIS — J90 Pleural effusion, not elsewhere classified: Secondary | ICD-10-CM | POA: Diagnosis not present

## 2022-01-09 DIAGNOSIS — H5461 Unqualified visual loss, right eye, normal vision left eye: Secondary | ICD-10-CM | POA: Diagnosis present

## 2022-01-09 DIAGNOSIS — D649 Anemia, unspecified: Secondary | ICD-10-CM | POA: Diagnosis not present

## 2022-01-09 DIAGNOSIS — I2699 Other pulmonary embolism without acute cor pulmonale: Secondary | ICD-10-CM | POA: Diagnosis present

## 2022-01-09 DIAGNOSIS — R069 Unspecified abnormalities of breathing: Secondary | ICD-10-CM | POA: Diagnosis not present

## 2022-01-09 DIAGNOSIS — R0602 Shortness of breath: Secondary | ICD-10-CM | POA: Diagnosis not present

## 2022-01-09 DIAGNOSIS — I2723 Pulmonary hypertension due to lung diseases and hypoxia: Secondary | ICD-10-CM | POA: Diagnosis present

## 2022-01-09 DIAGNOSIS — Z91199 Patient's noncompliance with other medical treatment and regimen due to unspecified reason: Secondary | ICD-10-CM | POA: Diagnosis not present

## 2022-01-09 DIAGNOSIS — E119 Type 2 diabetes mellitus without complications: Secondary | ICD-10-CM | POA: Diagnosis present

## 2022-01-09 DIAGNOSIS — Z833 Family history of diabetes mellitus: Secondary | ICD-10-CM | POA: Diagnosis not present

## 2022-01-09 DIAGNOSIS — I5030 Unspecified diastolic (congestive) heart failure: Secondary | ICD-10-CM | POA: Diagnosis present

## 2022-01-09 DIAGNOSIS — E876 Hypokalemia: Secondary | ICD-10-CM | POA: Diagnosis not present

## 2022-01-09 DIAGNOSIS — J470 Bronchiectasis with acute lower respiratory infection: Secondary | ICD-10-CM | POA: Diagnosis present

## 2022-01-09 DIAGNOSIS — I10 Essential (primary) hypertension: Secondary | ICD-10-CM | POA: Diagnosis present

## 2022-01-09 DIAGNOSIS — J449 Chronic obstructive pulmonary disease, unspecified: Secondary | ICD-10-CM | POA: Diagnosis not present

## 2022-01-09 DIAGNOSIS — Z79899 Other long term (current) drug therapy: Secondary | ICD-10-CM | POA: Diagnosis not present

## 2022-01-09 DIAGNOSIS — Z880 Allergy status to penicillin: Secondary | ICD-10-CM | POA: Diagnosis not present

## 2022-01-09 DIAGNOSIS — E785 Hyperlipidemia, unspecified: Secondary | ICD-10-CM

## 2022-01-09 DIAGNOSIS — F17210 Nicotine dependence, cigarettes, uncomplicated: Secondary | ICD-10-CM | POA: Diagnosis present

## 2022-01-09 DIAGNOSIS — R7303 Prediabetes: Secondary | ICD-10-CM

## 2022-01-09 DIAGNOSIS — Z743 Need for continuous supervision: Secondary | ICD-10-CM | POA: Diagnosis not present

## 2022-01-09 DIAGNOSIS — I5023 Acute on chronic systolic (congestive) heart failure: Secondary | ICD-10-CM | POA: Diagnosis not present

## 2022-01-09 DIAGNOSIS — J9611 Chronic respiratory failure with hypoxia: Secondary | ICD-10-CM | POA: Diagnosis present

## 2022-01-09 DIAGNOSIS — Z23 Encounter for immunization: Secondary | ICD-10-CM

## 2022-01-09 DIAGNOSIS — I2694 Multiple subsegmental pulmonary emboli without acute cor pulmonale: Secondary | ICD-10-CM

## 2022-01-09 DIAGNOSIS — I5033 Acute on chronic diastolic (congestive) heart failure: Secondary | ICD-10-CM | POA: Diagnosis present

## 2022-01-09 DIAGNOSIS — Z720 Tobacco use: Secondary | ICD-10-CM | POA: Diagnosis not present

## 2022-01-09 DIAGNOSIS — Z86711 Personal history of pulmonary embolism: Secondary | ICD-10-CM

## 2022-01-09 DIAGNOSIS — H5440 Blindness, one eye, unspecified eye: Secondary | ICD-10-CM | POA: Diagnosis present

## 2022-01-09 DIAGNOSIS — Z7984 Long term (current) use of oral hypoglycemic drugs: Secondary | ICD-10-CM | POA: Diagnosis not present

## 2022-01-09 DIAGNOSIS — N179 Acute kidney failure, unspecified: Secondary | ICD-10-CM | POA: Diagnosis present

## 2022-01-09 DIAGNOSIS — I110 Hypertensive heart disease with heart failure: Principal | ICD-10-CM | POA: Diagnosis present

## 2022-01-09 DIAGNOSIS — J9621 Acute and chronic respiratory failure with hypoxia: Secondary | ICD-10-CM | POA: Diagnosis present

## 2022-01-09 DIAGNOSIS — Z7901 Long term (current) use of anticoagulants: Secondary | ICD-10-CM | POA: Diagnosis not present

## 2022-01-09 DIAGNOSIS — I509 Heart failure, unspecified: Secondary | ICD-10-CM

## 2022-01-09 DIAGNOSIS — J811 Chronic pulmonary edema: Secondary | ICD-10-CM | POA: Diagnosis not present

## 2022-01-09 LAB — COMPREHENSIVE METABOLIC PANEL
ALT: 11 U/L (ref 0–44)
AST: 22 U/L (ref 15–41)
Albumin: 3.5 g/dL (ref 3.5–5.0)
Alkaline Phosphatase: 71 U/L (ref 38–126)
Anion gap: 12 (ref 5–15)
BUN: 13 mg/dL (ref 8–23)
CO2: 26 mmol/L (ref 22–32)
Calcium: 8.7 mg/dL — ABNORMAL LOW (ref 8.9–10.3)
Chloride: 104 mmol/L (ref 98–111)
Creatinine, Ser: 0.9 mg/dL (ref 0.44–1.00)
GFR, Estimated: >60 mL/min (ref >60)
Glucose, Bld: 102 mg/dL — ABNORMAL HIGH (ref 70–99)
Potassium: 3.5 mmol/L (ref 3.5–5.1)
Sodium: 142 mmol/L (ref 135–145)
Total Bilirubin: 0.5 mg/dL (ref 0.3–1.2)
Total Protein: 7.5 g/dL (ref 6.5–8.1)

## 2022-01-09 LAB — CBC
HCT: 29.9 % — ABNORMAL LOW (ref 36.0–46.0)
Hemoglobin: 8.4 g/dL — ABNORMAL LOW (ref 12.0–15.0)
MCH: 21.4 pg — ABNORMAL LOW (ref 26.0–34.0)
MCHC: 28.1 g/dL — ABNORMAL LOW (ref 30.0–36.0)
MCV: 76.3 fL — ABNORMAL LOW (ref 80.0–100.0)
Platelets: 198 10*3/uL (ref 150–400)
RBC: 3.92 MIL/uL (ref 3.87–5.11)
RDW: 19.7 % — ABNORMAL HIGH (ref 11.5–15.5)
WBC: 4.9 10*3/uL (ref 4.0–10.5)
nRBC: 0.6 % — ABNORMAL HIGH (ref 0.0–0.2)

## 2022-01-09 LAB — CBG MONITORING, ED
Glucose-Capillary: 97 mg/dL (ref 70–99)
Glucose-Capillary: 89 mg/dL (ref 70–99)

## 2022-01-09 LAB — BRAIN NATRIURETIC PEPTIDE: B Natriuretic Peptide: 758 pg/mL — ABNORMAL HIGH (ref 0.0–100.0)

## 2022-01-09 LAB — TROPONIN I (HIGH SENSITIVITY)
Troponin I (High Sensitivity): 6 ng/L (ref <18)
Troponin I (High Sensitivity): 5 ng/L (ref <18)

## 2022-01-09 LAB — GLUCOSE, CAPILLARY
Glucose-Capillary: 92 mg/dL (ref 70–99)
Glucose-Capillary: 105 mg/dL — ABNORMAL HIGH (ref 70–99)

## 2022-01-09 MED ORDER — SODIUM CHLORIDE 0.9% FLUSH: 3.0000 mL | INTRAVENOUS | Status: DC | PRN

## 2022-01-09 MED ORDER — FUROSEMIDE 10 MG/ML IJ SOLN
40.0000 mg | Freq: Once | INTRAMUSCULAR | Status: AC
  Administered 2022-01-09: 40 mg via INTRAVENOUS
  Filled 2022-01-09: qty 4

## 2022-01-09 MED ORDER — ZINC SULFATE 220 (50 ZN) MG PO CAPS
220.0000 mg | ORAL_CAPSULE | Freq: Every day | ORAL | Status: DC
  Administered 2022-01-09 – 2022-01-14 (×6): 220 mg via ORAL
  Filled 2022-01-09 (×6): qty 1

## 2022-01-09 MED ORDER — RIVAROXABAN 20 MG PO TABS
20.0000 mg | ORAL_TABLET | Freq: Every day | ORAL | Status: DC
  Administered 2022-01-09 – 2022-01-13 (×5): 20 mg via ORAL
  Filled 2022-01-09 (×5): qty 1

## 2022-01-09 MED ORDER — INFLUENZA VAC A&B SA ADJ QUAD 0.5 ML IM PRSY
0.5000 mL | PREFILLED_SYRINGE | INTRAMUSCULAR | Status: AC
  Administered 2022-01-10: 0.5 mL via INTRAMUSCULAR
  Filled 2022-01-09 (×2): qty 0.5

## 2022-01-09 MED ORDER — INSULIN ASPART 100 UNIT/ML IJ SOLN
0.0000 [IU] | Freq: Three times a day (TID) | INTRAMUSCULAR | Status: DC
  Administered 2022-01-10 – 2022-01-13 (×3): 1 [IU] via SUBCUTANEOUS

## 2022-01-09 MED ORDER — AMLODIPINE BESYLATE 5 MG PO TABS
10.0000 mg | ORAL_TABLET | Freq: Every day | ORAL | Status: DC
  Administered 2022-01-10 – 2022-01-14 (×5): 10 mg via ORAL
  Filled 2022-01-09 (×5): qty 2

## 2022-01-09 MED ORDER — POLYETHYLENE GLYCOL 3350 17 G PO PACK: 17.0000 g | PACK | Freq: Every day | ORAL | Status: DC | PRN

## 2022-01-09 MED ORDER — ACETAMINOPHEN 650 MG RE SUPP: 650.0000 mg | Freq: Four times a day (QID) | RECTAL | Status: DC | PRN

## 2022-01-09 MED ORDER — ONDANSETRON HCL 4 MG/2ML IJ SOLN: 4.0000 mg | Freq: Four times a day (QID) | INTRAMUSCULAR | Status: DC | PRN

## 2022-01-09 MED ORDER — FOLIC ACID 1 MG PO TABS
1.0000 mg | ORAL_TABLET | Freq: Every day | ORAL | Status: DC
  Administered 2022-01-10 – 2022-01-14 (×5): 1 mg via ORAL
  Filled 2022-01-09 (×5): qty 1

## 2022-01-09 MED ORDER — ACETAMINOPHEN 325 MG PO TABS
650.0000 mg | ORAL_TABLET | Freq: Four times a day (QID) | ORAL | Status: DC | PRN
  Administered 2022-01-11 – 2022-01-13 (×2): 650 mg via ORAL
  Filled 2022-01-09 (×2): qty 2

## 2022-01-09 MED ORDER — SODIUM CHLORIDE 0.9% FLUSH
3.0000 mL | Freq: Two times a day (BID) | INTRAVENOUS | Status: DC
  Administered 2022-01-09 – 2022-01-14 (×10): 3 mL via INTRAVENOUS

## 2022-01-09 MED ORDER — PANTOPRAZOLE SODIUM 40 MG PO TBEC
40.0000 mg | DELAYED_RELEASE_TABLET | Freq: Every day | ORAL | Status: DC
  Administered 2022-01-10 – 2022-01-14 (×5): 40 mg via ORAL
  Filled 2022-01-09 (×4): qty 1

## 2022-01-09 MED ORDER — ONDANSETRON HCL 4 MG PO TABS: 4.0000 mg | ORAL_TABLET | Freq: Four times a day (QID) | ORAL | Status: DC | PRN

## 2022-01-09 MED ORDER — FUROSEMIDE 10 MG/ML IJ SOLN
40.0000 mg | Freq: Two times a day (BID) | INTRAMUSCULAR | Status: DC
  Administered 2022-01-09 – 2022-01-10 (×2): 40 mg via INTRAVENOUS
  Filled 2022-01-09 (×2): qty 4

## 2022-01-09 MED ORDER — BISACODYL 10 MG RE SUPP: 10.0000 mg | Freq: Every day | RECTAL | Status: DC | PRN

## 2022-01-09 MED ORDER — ATORVASTATIN CALCIUM 20 MG PO TABS
20.0000 mg | ORAL_TABLET | Freq: Every day | ORAL | Status: DC
  Administered 2022-01-09 – 2022-01-14 (×6): 20 mg via ORAL
  Filled 2022-01-09 (×4): qty 1
  Filled 2022-01-09: qty 2
  Filled 2022-01-09: qty 1

## 2022-01-09 MED ORDER — VITAMIN D 25 MCG (1000 UNIT) PO TABS
1000.0000 [IU] | ORAL_TABLET | Freq: Every day | ORAL | Status: DC
  Administered 2022-01-10 – 2022-01-14 (×5): 1000 [IU] via ORAL
  Filled 2022-01-09 (×5): qty 1

## 2022-01-09 MED ORDER — SODIUM CHLORIDE 0.9% FLUSH
3.0000 mL | Freq: Two times a day (BID) | INTRAVENOUS | Status: DC
  Administered 2022-01-09 – 2022-01-14 (×9): 3 mL via INTRAVENOUS

## 2022-01-09 MED ORDER — SODIUM CHLORIDE 0.9 % IV SOLN: INTRAVENOUS | Status: DC | PRN

## 2022-01-09 MED ORDER — TRAZODONE HCL 50 MG PO TABS: 50.0000 mg | ORAL_TABLET | Freq: Every evening | ORAL | Status: DC | PRN

## 2022-01-09 MED ORDER — INSULIN ASPART 100 UNIT/ML IJ SOLN: 0.0000 [IU] | Freq: Every day | INTRAMUSCULAR | Status: DC

## 2022-01-09 MED ORDER — ALBUTEROL SULFATE (2.5 MG/3ML) 0.083% IN NEBU: 2.5000 mg | INHALATION_SOLUTION | RESPIRATORY_TRACT | Status: DC | PRN

## 2022-01-09 MED ORDER — HYDRALAZINE HCL 20 MG/ML IJ SOLN: 10.0000 mg | Freq: Four times a day (QID) | INTRAMUSCULAR | Status: DC | PRN

## 2022-01-09 NOTE — Telephone Encounter (Signed)
FYI, Received fax from after hours call center. Patient called having shortness of breath and chest pain all. Attempt to call patient she at the ED having xrays done 01/09/2022 @ 10:00 am.

## 2022-01-09 NOTE — Telephone Encounter (Signed)
Late entry: patient was seen in interim in hospital.

## 2022-01-09 NOTE — ED Notes (Signed)
Pt ambulated to bathroom (<51f) on room air. Oxygen saturation decreased to 74%. O2 administered once the patient was back in her bed, at 6lpm [place]. External catheter placed.

## 2022-01-09 NOTE — ED Provider Notes (Signed)
[facility] EMERGENCY DEPARTMENT Provider Note   CSN: [id number] Arrival date & time: 01/09/22  2393     History  Chief Complaint  Patient presents with   Shortness of Breath    SOB w/ Hypoxemia   HPI Marisa Bailey is a 81 y.o. 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  Shortness of breath started this morning.  Patient states it is worse all the time.  Endorses associated cough and wheezing.  Cough is productive with white sputum.  Denies sick contacts and fever.  Endorses chest pain in the center of her chest, nonradiating and nonreproducible.  Niece states that she was talking to her on the phone this morning as she was really short of breath and called EMS.  EMS placed her on 2 L nasal cannula in route.  Patient does not have a oxygen requirement at home. Denies calf tenderness. Patient currently on Xarelto.   HPI     Home Medications Prior to Admission medications   Medication Sig Start Date End Date Taking? Authorizing Provider  albuterol (VENTOLIN HFA) 108 (90 Base) MCG/ACT inhaler Inhale 2 puffs into the lungs every 6 (six) hours as needed for wheezing or shortness of breath. 03/15/21   Mercy Riding, MD  amLODipine (NORVASC) 10 MG tablet Take 1 tablet by mouth once daily 09/06/21   [name], MD  atorvastatin (LIPITOR) 20 MG tablet TAKE 1 TABLET BY MOUTH DAILY 05/30/21   [name], MD  Cholecalciferol (VITAMIN D3) 25 MCG (1000 UT) CAPS Take 1 capsule by mouth daily.    [provider]  folic acid (FOLVITE) 1 MG tablet Take 1 tablet by mouth once daily 01/08/22   [name], MD  ipratropium-albuterol (DUONEB) 0.5-2.5 (3) MG/3ML SOLN Take 3 mLs by nebulization every 4 (four) hours as needed. 12/25/21   Chesley Mires, MD  metFORMIN (GLUCOPHAGE) 500 MG tablet Take 1 tablet (500 mg total) by mouth 2 (two) times daily. 09/11/21   [name],  MD  pantoprazole (PROTONIX) 40 MG tablet Take 1 tablet (40 mg total) by mouth daily. 30 minutes before breakfast 11/28/21   Annitta Needs, NP  prednisoLONE acetate (PRED FORTE) 1 % ophthalmic suspension 1 drop 2 (two) times daily. 08/06/21   [provider]  rivaroxaban (XARELTO) 20 MG TABS tablet Take 1 tablet (20 mg total) by mouth daily with supper. 12/13/21   [name], MD  zinc gluconate 50 MG tablet Take 50 mg by mouth daily.    [provider]      Allergies    Penicillins    Review of Systems   Review of Systems  Respiratory:  Positive for shortness of breath.     Physical Exam Updated Vital Signs BP (!) 154/68   Pulse 61   Resp (!) 21   SpO2 (!) 88%  Physical Exam Vitals and nursing note reviewed.  HENT:     Head: Normocephalic and atraumatic.     Mouth/Throat:     Mouth: Mucous membranes are moist.  Eyes:     General:        Right eye: No discharge.        Left eye: No discharge.     Conjunctiva/sclera: Conjunctivae normal.  Cardiovascular:     Rate and Rhythm: Normal rate and regular rhythm.     Pulses: Normal pulses.  Heart sounds: Normal heart sounds.  Pulmonary:     Effort: Pulmonary effort is normal.     Breath sounds: Normal breath sounds.  Abdominal:     General: Abdomen is flat.     Palpations: Abdomen is soft.  Skin:    General: Skin is warm and dry.  Neurological:     General: No focal deficit present.  Psychiatric:        Mood and Affect: Mood normal.     ED Results / Procedures / Treatments   Labs (all labs ordered are listed, but only abnormal results are displayed) Labs Reviewed  BRAIN NATRIURETIC PEPTIDE - Abnormal; Notable for the following components:      Result Value   B Natriuretic Peptide 758.0 (*)    All other components within normal limits  COMPREHENSIVE METABOLIC PANEL - Abnormal; Notable for the following components:   Glucose, Bld 102 (*)    Calcium 8.7 (*)    All other components within normal  limits  CBC - Abnormal; Notable for the following components:   Hemoglobin 8.4 (*)    HCT 29.9 (*)    MCV 76.3 (*)    MCH 21.4 (*)    MCHC 28.1 (*)    RDW 19.7 (*)    nRBC 0.6 (*)    All other components within normal limits  TROPONIN I (HIGH SENSITIVITY)  TROPONIN I (HIGH SENSITIVITY)    EKG EKG Interpretation  Date/Time:  Wednesday January 09 2022 09:13:26 EST Ventricular Rate:  68 PR Interval:  197 QRS Duration: 98 QT Interval:  395 QTC Calculation: 421 R Axis:   157 Text Interpretation: Sinus rhythm Low voltage with right axis deviation Probable right ventricular hypertrophy Probable lateral infarct, old Baseline wander in lead(s) V1 Confirmed by Garnette Gunner [phone]) on 01/09/2022 10:25:17 AM  Radiology DG Chest 2 View  Result Date: 01/09/2022 CLINICAL DATA:  Shortness of breath EXAM: CHEST - 2 VIEW COMPARISON:  08/15/2021 FINDINGS: Bilateral mild interstitial thickening. Trace bilateral pleural effusions. Bibasilar airspace disease likely reflecting atelectasis. No pneumothorax. Stable cardiomegaly. No acute osseous abnormality. IMPRESSION: 1. Mild pulmonary edema. Electronically Signed   By: Kathreen Devoid M.D.   On: 01/09/2022 10:34    Procedures Procedures    Medications Ordered in ED Medications  furosemide (LASIX) injection 40 mg (has no administration in time range)  furosemide (LASIX) injection 40 mg (40 mg Intravenous Given 01/09/22 1105)    ED Course/ Medical Decision Making/ A&P                           Medical Decision Making  Initial Impression and Ddx 81 year old female well appearing in mild respiratory distress.  Parents were diagnosed with complaint clued CHF exacerbation, ACS, PE, COPD exacerbation, and pneumonia.  Exam notable for tachypnea and mild hypoxia but otherwise reassuring.  In encounter patient states she was not short of breath. Patient PMH that increases complexity of ED encounter: COPD, CHF, pulmonary hypertension related to  her COPD, recent pulmonary embolism  Interpretation of Diagnostics I independent reviewed and interpreted the labs as followed: Elevated BNP, anemia,  - I independently visualized the following imaging with scope of interpretation limited to determining acute life threatening conditions related to emergency care: CXR, which revealed mild pulmonary edema  I independently reviewed and interpreted an EKG which revealed sinus rhythm and was nonischemic.   Patient Reassessment and Ultimate Disposition/Management Increased from 2 L to 4 L nasal cannula for tachypnea  and hypoxia with noted improvement on telemetry.  Given pulmonary edema on chest x-ray, hypoxia and tachypnea and history of CHF, and elevation of BNP above baseline, symptoms likely consistent with CHF exacerbation.  Treated with Lasix.  Also have low suspicion for PE but given her recent history of PE order D-dimer.  Reassured the patient is compliant with her Xarelto.  Admitted to hospital service for CHF exacerbation.  Symptoms not consistent with pneumonia.  Low suspicion for COPD exacerbation given reassuring pulmonary exam without adventitious sounds.   Patient management required discussion with the following services or consulting groups:  Hospitalist Service  Complexity of Problems Addressed Chronic illness with exacerbation  Additional Data Reviewed and Analyzed Further history obtained from: Further history from spouse/family member, Prior ED visit notes, and Recent Consult notes  Patient Encounter Risk Assessment Consideration of hospitalization         Final Clinical Impression(s) / ED Diagnoses Final diagnoses:  SOB (shortness of breath)    Rx / DC Orders ED Discharge Orders     None         Harriet Pho, PA-C 01/09/22 1106    Cristie Hem, MD 01/09/22 1341

## 2022-01-09 NOTE — Progress Notes (Signed)
I applied knee length ted hose to patient's legs at 2005 pm. I went back into patients room at 2204 pm and patient had taken ted hose off of there legs and I asked her why she removed them and her response was "they make my legs hot". Patient re-educated on the purpose of the ted hose while in the hospital.

## 2022-01-09 NOTE — H&P (Signed)
Patient Demographics:    Marisa Bailey, is a 81 y.o. female  MRN: [id number]   DOB - Jul 31, 1940  Admit Date - 01/09/2022  Outpatient Primary MD for the patient is [name], MD   Assessment & Plan:   Assessment and Plan:  1)HFpEF--acute on chronic diastolic CHF exacerbation -Echo from 08/2021 with EF of 55 to 60% with grade 2 diastolic dysfunction moderate pulmonary hypertension --Chest X-ray with mild pulmonary edema and cardiomegaly Troponin 6, repeat 5, EKG sinus rhythm without acute changes BNP 758 much higher than prior baseline -IV Lasix, daily weights, fluid input and output monitoring and REDs Vest  advised  2) chronic anemia--- Hgb 8.4 which is not far from recent baseline -Patient with prior history of GI bleed -Recent EGD showed dieulafoy lesion cauterized,  -Continue Protonix -Monitor closely while on Xarelto  3)H/o PE -diagnosed in July 2023--continue Xarelto, patient tells me she has remained compliant with Xarelto  4) acute on chronic hypoxic respiratory failure--at baseline patient supposed to be on 2 L of oxygen via nasal cannula at home but admits to noncompliance due to the need to keep smoking -Oxygen saturations dropped to 74 % on room air and 84% on 2 L patient was bumped up to 4 L of oxygen with O2 sats at 92%  5)Social/Ethics---Additional history obtained from patient's niece [name] at bedside -Patient is a full code  6)Tobacco abuse--- she is now ready to quit smoking  7)HTN--- continue amlodipine, -Duloxetine as needed elevated BP  Disposition/Need for in-Hospital Stay- patient unable to be discharged at this time due to -acute on chronic diastolic CHF exacerbation requiring IV diuresis and close monitoring of renal parameters and electrolytes while diuresing*  Status is:  Inpatient  Remains inpatient appropriate because:   Dispo: The patient is from: Home              Anticipated d/c is to: Home              Anticipated d/c date is: 3 days              Patient currently is not medically stable to d/c. Barriers: Not Clinically Stable-    With History of - Reviewed by me  Past Medical History:  Diagnosis Date   Aspiration pneumonia ([place]) 11/03/2020   Blind right eye    CHF (congestive heart failure) (HCC)    COPD (chronic obstructive pulmonary disease) ([place])    Diabetes mellitus without complication ([place])    Fx upper humerus-closed 08/17/2012   H/O blood clots    lungs   Hypertension       Past Surgical History:  Procedure Laterality Date   CATARACT EXTRACTION W/PHACO Left 09/24/2021   Procedure: CATARACT EXTRACTION PHACO AND INTRAOCULAR LENS PLACEMENT ([place]);  Surgeon: Baruch Goldmann, MD;  Location: [facility];  Service: Ophthalmology;  Laterality: Left;  CDE: 8.08   ENUCLEATION     ESOPHAGOGASTRODUODENOSCOPY (EGD) WITH PROPOFOL N/A  10/25/2021   Procedure: ESOPHAGOGASTRODUODENOSCOPY (EGD) WITH PROPOFOL;  Surgeon: Harvel Quale, MD;  Location: [facility] END[facility] SUITE;  Service: Gastroenterology;  Laterality: N/A;   HEMOSTASIS CLIP PLACEMENT  10/25/2021   Procedure: HEMOSTASIS CLIP PLACEMENT;  Surgeon: Harvel Quale, MD;  Location: [facility];  Service: Gastroenterology;;   HOT HEMOSTASIS  10/25/2021   Procedure: HOT HEMOSTASIS (ARGON PLASMA COAGULATION/BICAP);  Surgeon: Montez Morita, Quillian Quince, MD;  Location: [facility];  Service: Gastroenterology;;   SUBMUCOSAL INJECTION  10/25/2021   Procedure: SUBMUCOSAL INJECTION;  Surgeon: Montez Morita, Quillian Quince, MD;  Location: [facility];  Service: Gastroenterology;;      Chief Complaint  Patient presents with   Shortness of Breath    SOB w/ Hypoxemia      HPI:    [name]  is a 81 y.o. female smoker with medical history significant for hypertension, chronic diastolic CHF,  COPD, chronic hypoxic respiratory failure (2 Liters ) ,  blindness in the right eye, recent diagnosis of PE (08/2021) on Eliquis presents to the ED with increasing shortness of breath- -in the ED oxygen saturations dropped to 74 % on room air and 84% on 2 L patient was bumped up to 4 L of oxygen with O2 sats at 92% -Additional history obtained from patient's niece [name] at bedside No fever  Or chills  Had no specific chest discomfort as well as epigastric discomfort which is since resolved No Nausea, Vomiting or Diarrhea -She has been compliant with Xarelto -Chest X-ray with mild pulmonary edema and cardiomegaly Troponin 6, repeat 5, EKG sinus rhythm without acute changes BNP 758 much higher than prior baseline -Creatinine 0.90 -Hgb 8.4    Review of systems:    In addition to the HPI above,   A full Review of  Systems was done, all other systems reviewed are negative except as noted above in HPI , .    Social History:  Reviewed by me    Social History   Tobacco Use   Smoking status: Every Day    Packs/day: 1.00    Years: 18.00    Total pack years: 18.00    Types: Cigarettes   Smokeless tobacco: Never  Substance Use Topics   Alcohol use: Not Currently    Comment: rarely       Family History :  Reviewed by me    Family History  Problem Relation Age of Onset   Diabetes Mother     Home Medications:   Prior to Admission medications   Medication Sig Start Date End Date Taking? Authorizing Provider  albuterol (VENTOLIN HFA) 108 (90 Base) MCG/ACT inhaler Inhale 2 puffs into the lungs every 6 (six) hours as needed for wheezing or shortness of breath. 03/15/21  Yes Mercy Riding, MD  amLODipine (NORVASC) 10 MG tablet Take 1 tablet by mouth once daily 09/06/21  Yes Patel, Colin Broach, MD  atorvastatin (LIPITOR) 20 MG tablet TAKE 1 TABLET BY MOUTH DAILY 05/30/21  Yes [name], MD  Cholecalciferol (VITAMIN D3) 25 MCG (1000 UT) CAPS Take 1 capsule by mouth daily.   Yes  [provider]  folic acid (FOLVITE) 1 MG tablet Take 1 tablet by mouth once daily 01/08/22  Yes [name], MD  metFORMIN (GLUCOPHAGE) 500 MG tablet Take 1 tablet (500 mg total) by mouth 2 (two) times daily. 09/11/21  Yes [name], MD  pantoprazole (PROTONIX) 40 MG tablet Take 1 tablet (40 mg total) by mouth daily. [place]  minutes before breakfast 11/28/21  Yes Annitta Needs, NP  rivaroxaban (XARELTO) 20 MG TABS tablet Take 1 tablet (20 mg total) by mouth daily with supper. 12/13/21  Yes [name], MD  zinc gluconate 50 MG tablet Take 50 mg by mouth daily.   Yes [provider]  ipratropium-albuterol (DUONEB) 0.5-2.5 (3) MG/3ML SOLN Take 3 mLs by nebulization every 4 (four) hours as needed. 12/25/21   Chesley Mires, MD  prednisoLONE acetate (PRED FORTE) 1 % ophthalmic suspension 1 drop 2 (two) times daily. 08/06/21   [provider]     Allergies:     Allergies  Allergen Reactions   Penicillins Rash    Has patient had a PCN reaction causing immediate rash, facial/tongue/throat swelling, SOB or lightheadedness with hypotension: {Yes/No:[id number] Has patient had a PCN reaction causing severe rash involving mucus membranes or skin necrosis: Yes Has patient had a PCN reaction that required hospitalization No Has patient had a PCN reaction occurring within the last 10 years: No If all of the above answers are "NO", then may proceed with Cephalosporin use.  **Has tolerated keflex      Physical Exam:   Vitals  Blood pressure 124/63, pulse 96, temperature 98.1 F (36.7 C), temperature source Oral, resp. rate 18, height _0  (1.626 m), weight 60.3 kg, SpO2 96 %.  Physical Examination: General appearance - alert,  in no distress  Mental status - alert, oriented to person, place, and time,  Nose- [place] 4L/min Eyes -enucleated right eye  neck - supple, no JVD elevation , Chest -diminished breath sounds with bibasilar rales  heart - S1 and S2 normal,  regular  Abdomen - soft, nontender, nondistended, +BS Neurological - screening mental status exam normal, neck supple without rigidity, cranial nerves II through XII intact, DTR's normal and symmetric Extremities - +ve pedal edema noted, intact peripheral pulses  Skin - warm, dry    Data Review:    CBC Recent Labs  Lab 01/09/22 0938  WBC 4.9  HGB 8.4*  HCT 29.9*  PLT 198  MCV 76.3*  MCH 21.4*  MCHC 28.1*  RDW 19.7*   ------------------------------------------------------------------------------------------------------------------  Chemistries  Recent Labs  Lab 01/09/22 0938  NA 142  K 3.5  CL 104  CO2 26  GLUCOSE 102*  BUN 13  CREATININE 0.90  CALCIUM 8.7*  AST 22  ALT 11  ALKPHOS 71  BILITOT 0.5   ------------------------------------------------------------------------------------------------------------------ estimated creatinine clearance is 42.3 mL/min (by C-G formula based on SCr of 0.9 mg/dL). ------------------------------------------------------------------------------------------------------------------ ------------------------------------------------------------------------------------------------------------------    Component Value Date/Time   BNP 758.0 (H) 01/09/2022 0938   Urinalysis    Component Value Date/Time   COLORURINE STRAW (A) 10/24/2021 2300   APPEARANCEUR CLEAR 10/24/2021 2300   LABSPEC 1.012 10/24/2021 2300   PHURINE 5.0 10/24/2021 2300   GLUCOSEU NEGATIVE 10/24/2021 2300   HGBUR NEGATIVE 10/24/2021 2300   BILIRUBINUR NEGATIVE 10/24/2021 2300   KETONESUR NEGATIVE 10/24/2021 2300   PROTEINUR NEGATIVE 10/24/2021 2300   NITRITE NEGATIVE 10/24/2021 2300   LEUKOCYTESUR NEGATIVE 10/24/2021 2300    Imaging Results:    DG Chest 2 View  Result Date: 01/09/2022 CLINICAL DATA:  Shortness of breath EXAM: CHEST - 2 VIEW COMPARISON:  08/15/2021 FINDINGS: Bilateral mild interstitial thickening. Trace bilateral pleural effusions. Bibasilar  airspace disease likely reflecting atelectasis. No pneumothorax. Stable cardiomegaly. No acute osseous abnormality. IMPRESSION: 1. Mild pulmonary edema. Electronically Signed   By: Kathreen Devoid M.D.   On: 01/09/2022 10:34    Radiological Exams  on Admission: DG Chest 2 View  Result Date: 01/09/2022 CLINICAL DATA:  Shortness of breath EXAM: CHEST - 2 VIEW COMPARISON:  08/15/2021 FINDINGS: Bilateral mild interstitial thickening. Trace bilateral pleural effusions. Bibasilar airspace disease likely reflecting atelectasis. No pneumothorax. Stable cardiomegaly. No acute osseous abnormality. IMPRESSION: 1. Mild pulmonary edema. Electronically Signed   By: Kathreen Devoid M.D.   On: 01/09/2022 10:34    DVT Prophylaxis -SCD/Xarelto AM Labs Ordered, also please review Full Orders  Family Communication: Admission, patients condition and plan of care including tests being ordered have been discussed with the patient and Niece [name] who indicate understanding and agree with the plan   Condition -stable  Roxan Hockey M.D on 01/09/2022 at 4:46 PM Go to [email or web address] -  for contact info  [facility] - Office  [phone]

## 2022-01-10 DIAGNOSIS — I5033 Acute on chronic diastolic (congestive) heart failure: Secondary | ICD-10-CM | POA: Diagnosis not present

## 2022-01-10 DIAGNOSIS — J9611 Chronic respiratory failure with hypoxia: Secondary | ICD-10-CM | POA: Diagnosis not present

## 2022-01-10 LAB — CBC
HCT: 25.8 % — ABNORMAL LOW (ref 36.0–46.0)
Hemoglobin: 7.3 g/dL — ABNORMAL LOW (ref 12.0–15.0)
MCH: 21.5 pg — ABNORMAL LOW (ref 26.0–34.0)
MCHC: 28.3 g/dL — ABNORMAL LOW (ref 30.0–36.0)
MCV: 75.9 fL — ABNORMAL LOW (ref 80.0–100.0)
Platelets: 170 10*3/uL (ref 150–400)
RBC: 3.4 MIL/uL — ABNORMAL LOW (ref 3.87–5.11)
RDW: 19.9 % — ABNORMAL HIGH (ref 11.5–15.5)
WBC: 5.2 10*3/uL (ref 4.0–10.5)
nRBC: 0.8 % — ABNORMAL HIGH (ref 0.0–0.2)

## 2022-01-10 LAB — BASIC METABOLIC PANEL
Anion gap: 7 (ref 5–15)
BUN: 17 mg/dL (ref 8–23)
CO2: 30 mmol/L (ref 22–32)
Calcium: 8.1 mg/dL — ABNORMAL LOW (ref 8.9–10.3)
Chloride: 104 mmol/L (ref 98–111)
Creatinine, Ser: 1.46 mg/dL — ABNORMAL HIGH (ref 0.44–1.00)
GFR, Estimated: 36 mL/min — ABNORMAL LOW (ref >60)
Glucose, Bld: 86 mg/dL (ref 70–99)
Potassium: 3.9 mmol/L (ref 3.5–5.1)
Sodium: 141 mmol/L (ref 135–145)

## 2022-01-10 LAB — GLUCOSE, CAPILLARY
Glucose-Capillary: 108 mg/dL — ABNORMAL HIGH (ref 70–99)
Glucose-Capillary: 78 mg/dL (ref 70–99)
Glucose-Capillary: 135 mg/dL — ABNORMAL HIGH (ref 70–99)
Glucose-Capillary: 96 mg/dL (ref 70–99)

## 2022-01-10 LAB — PREPARE RBC (CROSSMATCH)

## 2022-01-10 MED ORDER — FUROSEMIDE 10 MG/ML IJ SOLN
40.0000 mg | Freq: Once | INTRAMUSCULAR | Status: AC
  Administered 2022-01-10: 40 mg via INTRAVENOUS
  Filled 2022-01-10: qty 4

## 2022-01-10 MED ORDER — SODIUM CHLORIDE 0.9% IV SOLUTION: Freq: Once | INTRAVENOUS | Status: AC

## 2022-01-10 MED ORDER — LIVING BETTER WITH HEART FAILURE BOOK: Freq: Once | Status: AC

## 2022-01-10 NOTE — TOC Initial Note (Signed)
Transition of Care [facility]) - Initial/Assessment Note    Patient Details  Name: Marisa Bailey MRN: [id number] Date of Birth: 28-May-1940  [facility] CM/SW Contact:    Boneta Lucks, RN Phone Number: 01/10/2022, 10:58 AM  Clinical Narrative:        Patient admitted with acute exacerbation of CHF. Patient has a high risk for readmission. Patient lives alone,drives herself as needed and is independent at baseline. No walking aids needed. Patient has CHF and has not had any education on diet, daily weights or fluid restrictions. She is agreeable to [facility] for education. TOC spent out referral to see who will accept her insurance. MD aware to order. CHF book ordered. TOC to follow.           Expected Discharge Plan: [place] Barriers to Discharge: Continued Medical Work up   Patient Goals and CMS Choice Patient states their goals for this hospitalization and ongoing recovery are:: to go home. CMS Medicare.gov Compare Post Acute Care list provided to:: Patient Choice offered to / list presented to : Patient  Expected Discharge Plan and Services Expected Discharge Plan: [place]      Living arrangements for the past 2 months: Single Family Home         Prior Living Arrangements/Services Living arrangements for the past 2 months: Single Family Home Lives with:: Self Patient language and need for interpreter reviewed:: Yes        Need for Family Participation in Patient Care: Yes (Comment) Care giver support system in place?: Yes (comment)   Criminal Activity/Legal Involvement Pertinent to Current Situation/Hospitalization: No - Comment as needed  Activities of Daily Living Home Assistive Devices/Equipment: None ADL Screening (condition at time of admission) Patient's cognitive ability adequate to safely complete daily activities?: Yes Is the patient deaf or have difficulty hearing?: No Does the patient have difficulty seeing, even  when wearing glasses/contacts?: Yes Does the patient have difficulty concentrating, remembering, or making decisions?: No Patient able to express need for assistance with ADLs?: Yes Does the patient have difficulty dressing or bathing?: No Independently performs ADLs?: Yes (appropriate for developmental age) Does the patient have difficulty walking or climbing stairs?: No Weakness of Legs: None Weakness of Arms/Hands: None  Permission Sought/Granted       Emotional Assessment   Attitude/Demeanor/Rapport: Engaged Affect (typically observed): Accepting Orientation: : Oriented to Self, Oriented to Place, Oriented to  Time, Oriented to Situation Alcohol / Substance Use: Not Applicable Psych Involvement: No (comment)  Admission diagnosis:  SOB (shortness of breath) [R06.02] Acute exacerbation of CHF (congestive heart failure) (HCC) [I50.9] Patient Active Problem List   Diagnosis Date Noted   Acute exacerbation of CHF (congestive heart failure) ([place]) 01/09/2022   Bronchiectasis with acute lower respiratory infection ([place]) 11/30/2021   Refused influenza vaccine 10/30/2021   Hospital discharge follow-up 10/30/2021   High risk medication use    GI bleed 10/24/2021   Encounter for general adult medical examination with abnormal findings 09/11/2021   Pulmonary emboli ([place]) 08/15/2021   Symptomatic anemia 08/15/2021   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[place]) 11/10/2020   Blindness of right eye 11/10/2020   Prediabetes 11/10/2020   HLD (hyperlipidemia) 11/10/2020   (HFpEF) heart failure with preserved ejection fraction ([place]) 11/03/2020   Chronic respiratory failure with hypoxia ([place]) 01/11/2019   Leg swelling    COPD (chronic obstructive pulmonary disease) ([place]) 06/27/2017   Tobacco abuse 06/27/2017   Essential hypertension  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 [place] [address] Phone: [phone] Fax: [phone]   Readmission Risk Interventions    01/10/2022   10:57 AM  Readmission Risk Prevention Plan  Transportation Screening Complete  PCP or Specialist Appt within 3-5 Days Not Complete  HRI or [place] Complete  Social Work Consult for [place] Planning/Counseling Complete  Palliative Care Screening Not Applicable  Medication Review Press photographer) Complete

## 2022-01-10 NOTE — Progress Notes (Signed)
PROGRESS NOTE     Marisa Bailey, is a 81 y.o. female, DOB - 1941/01/18, JOI:[id number]  Admit date - 01/09/2022   Admitting Physician Aliea Bobe Denton Brick, MD  Outpatient Primary MD for the patient is [name], MD  LOS - 1  Chief Complaint  Patient presents with   Shortness of Breath    SOB w/ Hypoxemia        Brief Narrative:  81 y.o. 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) on Eliquis admitted with acute on chronic diastolic dysfunction CHF exacerbation acute on chronic hypoxic respiratory failure  A/p 1)HFpEF--acute on chronic diastolic CHF exacerbation -Echo from 08/2021 with EF of 55 to 60% with grade 2 diastolic dysfunction moderate pulmonary hypertension --Chest X-ray with mild pulmonary edema and cardiomegaly Troponin 6, repeat 5, EKG sinus rhythm without acute changes BNP 758 much higher than prior baseline -Continue daily weights, fluid input and output monitoring and REDs Vest  advised -Creatinine bumped up so we will adjust IV Lasix   2) acute on chronic symptomatic  anemia--- Hgb is down to 7.3 from 8.4 on admission -Patient with prior history of GI bleed -Recent EGD showed dieulafoy lesion cauterized,  -Continue Protonix -Monitor closely while on Xarelto -Transfuse 1 unit of PRBC on 01/10/2022 -dizziness and dyspnea on exertion with palpitations noted -Also complains of fatigue and malaise   3)H/o PE -diagnosed in July 2023--continue Xarelto, patient tells me she has remained compliant with Xarelto   4) acute on chronic hypoxic respiratory failure--at baseline patient supposed to be on 2 L of oxygen via nasal cannula at home but admits to noncompliance due to the need to keep smoking -Oxygen saturations dropped to 74 % on room air and 84% on 2 L patient was bumped up to 4 L of oxygen with O2 sats at  92%   5)Social/Ethics---Additional history obtained from patient's niece [name] at bedside -Patient is a full code   6)Tobacco abuse--- she is not ready to quit smoking   7)HTN--- continue amlodipine, -Duloxetine as needed elevated BP   Disposition/Need for in-Hospital Stay- patient unable to be discharged at this time due to -acute on chronic diastolic CHF exacerbation requiring IV diuresis and close monitoring of renal parameters and electrolytes while diuresing*   Status is: Inpatient   Remains inpatient appropriate because:    Dispo: The patient is from: Home              Anticipated d/c is to: Home              Anticipated d/c date is: 3 days              Patient currently is not medically stable to d/c. Barriers: Not Clinically Stable-   Code Status :  -  Code Status: Full Code   Family Communication:   Discussed with Niece [name]  DVT Prophylaxis  :   - SCDs  SCDs Start: 01/09/22 1108 Place TED hose Start: 01/09/22 1108 rivaroxaban (XARELTO) tablet 20 mg   Lab Results  Component Value Date   PLT 170 01/10/2022    Inpatient Medications  Scheduled Meds:  amLODipine  10 mg Oral Daily   atorvastatin  20 mg Oral Daily   cholecalciferol  1,000 Units Oral Daily   folic acid  1 mg Oral Daily   insulin aspart  0-5 Units Subcutaneous  QHS   insulin aspart  0-9 Units Subcutaneous TID WC   pantoprazole  40 mg Oral Q breakfast   rivaroxaban  20 mg Oral Q supper   sodium chloride flush  3 mL Intravenous Q12H   sodium chloride flush  3 mL Intravenous Q12H   zinc sulfate  220 mg Oral Daily   Continuous Infusions:  sodium chloride     PRN Meds:.sodium chloride, acetaminophen **OR** acetaminophen, albuterol, bisacodyl, hydrALAZINE, ondansetron **OR** ondansetron (ZOFRAN) IV, polyethylene glycol, sodium chloride flush, traZODone   Anti-infectives (From admission, onward)    None         Subjective: Marisa Bailey today has no fevers, no emesis,  No chest pain,    No fever  Or chills   No Nausea, Vomiting or Diarrhea -No further shortness of breath at rest -Some dizziness and dyspnea on exertion with palpitations noted -Also complains of fatigue and malaise     Objective: Vitals:   01/10/22 0536 01/10/22 1000 01/10/22 1030 01/10/22 1525  BP: 135/66 139/65 (!) 129/53 135/72  Pulse: 73 66 69 62  Resp: _0 Temp: 98.5 F (36.9 C) 98.5 F (36.9 C) 98.6 F (37 C) 97.8 F (36.6 C)  TempSrc:  Oral  Oral  SpO2: 91% 95%    Weight:      Height:        Intake/Output Summary (Last 24 hours) at 01/10/2022 1824 Last data filed at 01/10/2022 1718 Gross per 24 hour  Intake 1392.33 ml  Output 1850 ml  Net -457.67 ml   Filed Weights   01/09/22 1313 01/10/22 0505  Weight: 60.3 kg 61.1 kg    Physical Exam  Physical Examination: General appearance - alert,  in no distress  Mental status - alert, oriented to person, place, and time,  Nose- [place] 4L/min Eyes -enucleated right eye  neck - supple, no JVD elevation , Chest -improving air movement, no wheezing  heart - S1 and S2 normal, regular  Abdomen - soft, nontender, nondistended, +BS Neurological - screening mental status exam normal, neck supple without rigidity, cranial nerves II through XII intact, DTR's normal and symmetric Extremities -improving pedal edema noted, intact peripheral pulses  Skin - warm, dry  Data Reviewed: I have personally reviewed following labs and imaging studies  CBC: Recent Labs  Lab 01/09/22 0938 01/10/22 0320  WBC 4.9 5.2  HGB 8.4* 7.3*  HCT 29.9* 25.8*  MCV 76.3* 75.9*  PLT 198 590   Basic Metabolic Panel: Recent Labs  Lab 01/09/22 0938 01/10/22 0320  NA 142 141  K 3.5 3.9  CL 104 104  CO2 26 30  GLUCOSE 102* 86  BUN 13 17  CREATININE 0.90 1.46*  CALCIUM 8.7* 8.1*   GFR: Estimated Creatinine Clearance: 26.1 mL/min (A) (by C-G formula based on SCr of 1.46 mg/dL (H)). Liver Function Tests: Recent Labs  Lab 01/09/22 0938  AST 22   ALT 11  ALKPHOS 71  BILITOT 0.5  PROT 7.5  ALBUMIN 3.5    Radiology Studies: DG Chest 2 View  Result Date: 01/09/2022 CLINICAL DATA:  Shortness of breath EXAM: CHEST - 2 VIEW COMPARISON:  08/15/2021 FINDINGS: Bilateral mild interstitial thickening. Trace bilateral pleural effusions. Bibasilar airspace disease likely reflecting atelectasis. No pneumothorax. Stable cardiomegaly. No acute osseous abnormality. IMPRESSION: 1. Mild pulmonary edema. Electronically Signed   By: Kathreen Devoid M.D.   On: 01/09/2022 10:34     Scheduled Meds:  amLODipine  10 mg Oral Daily   atorvastatin  20 mg Oral Daily   cholecalciferol  1,000 Units Oral Daily   folic acid  1 mg Oral Daily   insulin aspart  0-5 Units Subcutaneous QHS   insulin aspart  0-9 Units Subcutaneous TID WC   pantoprazole  40 mg Oral Q breakfast   rivaroxaban  20 mg Oral Q supper   sodium chloride flush  3 mL Intravenous Q12H   sodium chloride flush  3 mL Intravenous Q12H   zinc sulfate  220 mg Oral Daily   Continuous Infusions:  sodium chloride       LOS: 1 day   Roxan Hockey M.D on 01/10/2022 at 6:24 PM  Go to [email or web address] - for contact info  [facility] - Office  [phone]  If 7PM-7AM, please contact night-coverage [email or web address] 01/10/2022, 6:24 PM

## 2022-01-11 DIAGNOSIS — I5033 Acute on chronic diastolic (congestive) heart failure: Secondary | ICD-10-CM | POA: Diagnosis not present

## 2022-01-11 DIAGNOSIS — J9611 Chronic respiratory failure with hypoxia: Secondary | ICD-10-CM | POA: Diagnosis not present

## 2022-01-11 LAB — CBC
HCT: 30.2 % — ABNORMAL LOW (ref 36.0–46.0)
Hemoglobin: 8.7 g/dL — ABNORMAL LOW (ref 12.0–15.0)
MCH: 21.8 pg — ABNORMAL LOW (ref 26.0–34.0)
MCHC: 28.8 g/dL — ABNORMAL LOW (ref 30.0–36.0)
MCV: 75.7 fL — ABNORMAL LOW (ref 80.0–100.0)
Platelets: 188 10*3/uL (ref 150–400)
RBC: 3.99 MIL/uL (ref 3.87–5.11)
RDW: 20.2 % — ABNORMAL HIGH (ref 11.5–15.5)
WBC: 5.8 10*3/uL (ref 4.0–10.5)
nRBC: 0.3 % — ABNORMAL HIGH (ref 0.0–0.2)

## 2022-01-11 LAB — RENAL FUNCTION PANEL
Albumin: 3.1 g/dL — ABNORMAL LOW (ref 3.5–5.0)
Anion gap: 11 (ref 5–15)
BUN: 21 mg/dL (ref 8–23)
CO2: 34 mmol/L — ABNORMAL HIGH (ref 22–32)
Calcium: 7.8 mg/dL — ABNORMAL LOW (ref 8.9–10.3)
Chloride: 97 mmol/L — ABNORMAL LOW (ref 98–111)
Creatinine, Ser: 1.09 mg/dL — ABNORMAL HIGH (ref 0.44–1.00)
GFR, Estimated: 51 mL/min — ABNORMAL LOW (ref >60)
Glucose, Bld: 75 mg/dL (ref 70–99)
Phosphorus: 3.6 mg/dL (ref 2.5–4.6)
Potassium: 3.2 mmol/L — ABNORMAL LOW (ref 3.5–5.1)
Sodium: 142 mmol/L (ref 135–145)

## 2022-01-11 LAB — TYPE AND SCREEN
ABO/RH(D): B POS
Antibody Screen: NEGATIVE
Unit division: 0

## 2022-01-11 LAB — GLUCOSE, CAPILLARY
Glucose-Capillary: 92 mg/dL (ref 70–99)
Glucose-Capillary: 92 mg/dL (ref 70–99)
Glucose-Capillary: 85 mg/dL (ref 70–99)
Glucose-Capillary: 150 mg/dL — ABNORMAL HIGH (ref 70–99)

## 2022-01-11 LAB — BPAM RBC
Blood Product Expiration Date: 202312282359
ISSUE DATE / TIME: 202311301013
Unit Type and Rh: 5100

## 2022-01-11 MED ORDER — FUROSEMIDE 10 MG/ML IJ SOLN
20.0000 mg | Freq: Every day | INTRAMUSCULAR | Status: DC
  Administered 2022-01-11 – 2022-01-14 (×4): 20 mg via INTRAVENOUS
  Filled 2022-01-11 (×4): qty 2

## 2022-01-11 MED ORDER — POTASSIUM CHLORIDE CRYS ER 20 MEQ PO TBCR
40.0000 meq | EXTENDED_RELEASE_TABLET | ORAL | Status: AC
  Administered 2022-01-11 (×2): 40 meq via ORAL
  Filled 2022-01-11: qty 2

## 2022-01-11 NOTE — Progress Notes (Signed)
PROGRESS NOTE     Marisa Bailey, is a 81 y.o. female, DOB - [birth year], GGE:[id number]  Admit date - 01/09/2022   Admitting Physician Alexzavier Girardin Denton Brick, MD  Outpatient Primary MD for the patient is [name], MD  LOS - 2  Chief Complaint  Patient presents with   Shortness of Breath    SOB w/ Hypoxemia        Brief Narrative:  81 y.o. 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) on Eliquis admitted with acute on chronic diastolic dysfunction CHF exacerbation acute on chronic hypoxic respiratory failure  A/p 1)HFpEF--acute on chronic diastolic CHF exacerbation -Echo from 08/2021 with EF of 55 to 60% with grade 2 diastolic dysfunction moderate pulmonary hypertension -- On admission chest X-ray with mild pulmonary edema and cardiomegaly Troponin 6, repeat 5, EKG sinus rhythm without acute changes BNP 758 much higher than prior baseline -Continue daily weights, fluid input and output monitoring and REDs Vest  advised -Creatinine has improved, continue IV Lasix   2) acute on chronic symptomatic  anemia--- Hgb is down to 7.3 from 8.4 on admission -Patient with prior history of GI bleed -Recent EGD showed dieulafoy lesion cauterized,  -Continue Protonix -Monitor closely while on Xarelto -Hgb is up to 8.7 from 7.3 after 1 unit of PRBC on 01/10/2022 -Dyspnea on exertion and dizziness improving   3)H/o PE -diagnosed in July 2023--continue Xarelto, patient tells me she has remained compliant with Xarelto   4) acute on chronic hypoxic respiratory failure--at baseline patient supposed to be on 2 L of oxygen via nasal cannula at home but admits to noncompliance due to the need to keep smoking -Oxygen saturations dropped to 74 % on room air and 84% on 2 L patient was bumped up to 4 L of oxygen with O2 sats at 92% -Continue IV  Lasix -Repeat chest x-ray in a.m.   5)Social/Ethics---Additional history obtained from patient's niece [name] at bedside -Patient is a full code   6)Tobacco abuse--- she is not ready to quit smoking   7)HTN--- continue amlodipine, -Duloxetine as needed elevated BP   Disposition/Need for in-Hospital Stay- patient unable to be discharged at this time due to -acute on chronic diastolic CHF exacerbation requiring IV diuresis and close monitoring of renal parameters and electrolytes while diuresing*  -Possible discharge home on 01/12/2022 with home health  Status is: Inpatient   Remains inpatient appropriate because:    Dispo: The patient is from: Home              Anticipated d/c is to: Home              Anticipated d/c date is: 2 days              Patient currently is not medically stable to d/c. Barriers: Not Clinically Stable-   Code Status :  -  Code Status: Full Code   Family Communication:   Discussed with Niece [name] -I called and updated patient's niece Ms. Denise Stubblefield DVT Prophylaxis  :   - SCDs  SCDs Start: 01/09/22 1108 Place TED hose Start: 01/09/22 1108 rivaroxaban (XARELTO) tablet 20 mg   Lab Results  Component Value Date   PLT 188 01/11/2022    Inpatient Medications  Scheduled Meds:  amLODipine  10 mg Oral Daily   atorvastatin  20 mg Oral Daily  cholecalciferol  1,000 Units Oral Daily   folic acid  1 mg Oral Daily   furosemide  20 mg Intravenous Daily   insulin aspart  0-5 Units Subcutaneous QHS   insulin aspart  0-9 Units Subcutaneous TID WC   pantoprazole  40 mg Oral Q breakfast   rivaroxaban  20 mg Oral Q supper   sodium chloride flush  3 mL Intravenous Q12H   sodium chloride flush  3 mL Intravenous Q12H   zinc sulfate  220 mg Oral Daily   Continuous Infusions:  sodium chloride     PRN Meds:.sodium chloride, acetaminophen **OR** acetaminophen, albuterol, bisacodyl, hydrALAZINE, ondansetron **OR** ondansetron (ZOFRAN) IV, polyethylene  glycol, sodium chloride flush, traZODone   Anti-infectives (From admission, onward)    None        Subjective: Marisa Bailey today has no fevers, no emesis,  No chest pain,   No fever  Or chills   DOE is improving...  -no bleeding concerns -I called and updated patient's niece Ms. Denise Stubblefield  Objective: Vitals:   01/10/22 2100 01/11/22 0425 01/11/22 0500 01/11/22 1237  BP: 128/64 (!) 154/67  128/61  Pulse: 68 65  79  Resp: _0 Temp: 98.2 F (36.8 C) 98.2 F (36.8 C)    TempSrc: Oral Oral    SpO2: 92% 91%  90%  Weight:   59.1 kg   Height:        Intake/Output Summary (Last 24 hours) at 01/11/2022 1825 Last data filed at 01/11/2022 1100 Gross per 24 hour  Intake 720 ml  Output 1900 ml  Net -1180 ml   Filed Weights   01/09/22 1313 01/10/22 0505 01/11/22 0500  Weight: 60.3 kg 61.1 kg 59.1 kg    Physical Exam  Physical Examination: General appearance - alert,  in no distress  Mental status - alert, oriented to person, place, and time,  Nose- [place] 4L/min Eyes -enucleated right eye  neck - supple, no JVD elevation , Chest -improving air movement, no wheezing  heart - S1 and S2 normal, regular  Abdomen - soft, nontender, nondistended, +BS Neurological - screening mental status exam normal, neck supple without rigidity, cranial nerves II through XII intact, DTR's normal and symmetric Extremities -improving pedal edema noted, intact peripheral pulses  Skin - warm, dry  Data Reviewed: I have personally reviewed following labs and imaging studies  CBC: Recent Labs  Lab 01/09/22 0938 01/10/22 0320 01/11/22 0356  WBC 4.9 5.2 5.8  HGB 8.4* 7.3* 8.7*  HCT 29.9* 25.8* 30.2*  MCV 76.3* 75.9* 75.7*  PLT 198 170 154   Basic Metabolic Panel: Recent Labs  Lab 01/09/22 0938 01/10/22 0320 01/11/22 0356  NA 142 141 142  K 3.5 3.9 3.2*  CL 104 104 97*  CO2 26 30 34*  GLUCOSE 102* 86 75  BUN _1 CREATININE 0.90 1.46* 1.09*  CALCIUM 8.7* 8.1*  7.8*  PHOS  --   --  3.6   GFR: Estimated Creatinine Clearance: 35 mL/min (A) (by C-G formula based on SCr of 1.09 mg/dL (H)). Liver Function Tests: Recent Labs  Lab 01/09/22 0938 01/11/22 0356  AST 22  --   ALT 11  --   ALKPHOS 71  --   BILITOT 0.5  --   PROT 7.5  --   ALBUMIN 3.5 3.1*    Radiology Studies: No results found.  Scheduled Meds:  amLODipine  10 mg Oral Daily   atorvastatin  20 mg Oral Daily  cholecalciferol  1,000 Units Oral Daily   folic acid  1 mg Oral Daily   furosemide  20 mg Intravenous Daily   insulin aspart  0-5 Units Subcutaneous QHS   insulin aspart  0-9 Units Subcutaneous TID WC   pantoprazole  40 mg Oral Q breakfast   rivaroxaban  20 mg Oral Q supper   sodium chloride flush  3 mL Intravenous Q12H   sodium chloride flush  3 mL Intravenous Q12H   zinc sulfate  220 mg Oral Daily   Continuous Infusions:  sodium chloride       LOS: 2 days   Roxan Hockey M.D on 01/11/2022 at 6:25 PM  Go to [email or web address] - for contact info  [facility] - Office  [phone]  If 7PM-7AM, please contact night-coverage [email or web address] 01/11/2022, 6:25 PM

## 2022-01-11 NOTE — TOC Progression Note (Signed)
Transition of Care [facility]) - Progression Note    Patient Details  Name: Marisa Bailey MRN: [id number] Date of Birth: 1941-01-28  Transition of Care [facility]) CM/SW Contact  Boneta Lucks, RN Phone Number: 01/11/2022, 10:24 AM  Clinical Narrative:   Lattie Haw with [facility] accepted the referral for [facility]. Orders placed. Added to AVS.  Expected Discharge Plan: [place] Barriers to Discharge: Continued Medical Work up  Expected Discharge Plan and Services Expected Discharge Plan: [place] arrangements for the past 2 months: [place] Agency: [place] Date [place]: 01/10/22 Time [place]: 2103 Representative spoke with at [place]: Lattie Haw    Readmission Risk Interventions    01/10/2022   10:57 AM  Readmission Risk Prevention Plan  Transportation Screening Complete  PCP or Specialist Appt within 3-5 Days Not Complete  HRI or [place] Complete  Social Work Consult for [place] Planning/Counseling Complete  Palliative Care Screening Not Applicable  Medication Review Press photographer) Complete

## 2022-01-11 NOTE — Care Management Important Message (Signed)
Important Message  Patient Details  Name: Marisa Bailey MRN: [id number] Date of Birth: 1941/01/26   Medicare Important Message Given:  Yes     [name] 01/11/2022, 9:55 AM

## 2022-01-12 ENCOUNTER — Inpatient Hospital Stay (HOSPITAL_COMMUNITY): Payer: Medicare Other

## 2022-01-12 DIAGNOSIS — I5033 Acute on chronic diastolic (congestive) heart failure: Secondary | ICD-10-CM | POA: Diagnosis not present

## 2022-01-12 DIAGNOSIS — J9611 Chronic respiratory failure with hypoxia: Secondary | ICD-10-CM | POA: Diagnosis not present

## 2022-01-12 DIAGNOSIS — I5030 Unspecified diastolic (congestive) heart failure: Secondary | ICD-10-CM | POA: Diagnosis not present

## 2022-01-12 LAB — BASIC METABOLIC PANEL
Anion gap: 7 (ref 5–15)
BUN: 25 mg/dL — ABNORMAL HIGH (ref 8–23)
CO2: 34 mmol/L — ABNORMAL HIGH (ref 22–32)
Calcium: 7.4 mg/dL — ABNORMAL LOW (ref 8.9–10.3)
Chloride: 101 mmol/L (ref 98–111)
Creatinine, Ser: 1.13 mg/dL — ABNORMAL HIGH (ref 0.44–1.00)
GFR, Estimated: 49 mL/min — ABNORMAL LOW (ref >60)
Glucose, Bld: 87 mg/dL (ref 70–99)
Potassium: 4 mmol/L (ref 3.5–5.1)
Sodium: 142 mmol/L (ref 135–145)

## 2022-01-12 LAB — CBC
HCT: 31.8 % — ABNORMAL LOW (ref 36.0–46.0)
Hemoglobin: 8.9 g/dL — ABNORMAL LOW (ref 12.0–15.0)
MCH: 21.4 pg — ABNORMAL LOW (ref 26.0–34.0)
MCHC: 28 g/dL — ABNORMAL LOW (ref 30.0–36.0)
MCV: 76.4 fL — ABNORMAL LOW (ref 80.0–100.0)
Platelets: 201 10*3/uL (ref 150–400)
RBC: 4.16 MIL/uL (ref 3.87–5.11)
RDW: 20.8 % — ABNORMAL HIGH (ref 11.5–15.5)
WBC: 5.4 10*3/uL (ref 4.0–10.5)
nRBC: 0.4 % — ABNORMAL HIGH (ref 0.0–0.2)

## 2022-01-12 LAB — GLUCOSE, CAPILLARY
Glucose-Capillary: 100 mg/dL — ABNORMAL HIGH (ref 70–99)
Glucose-Capillary: 128 mg/dL — ABNORMAL HIGH (ref 70–99)
Glucose-Capillary: 89 mg/dL (ref 70–99)
Glucose-Capillary: 172 mg/dL — ABNORMAL HIGH (ref 70–99)

## 2022-01-12 LAB — BRAIN NATRIURETIC PEPTIDE: B Natriuretic Peptide: 251 pg/mL — ABNORMAL HIGH (ref 0.0–100.0)

## 2022-01-12 NOTE — TOC Transition Note (Signed)
Transition of Care [facility]) - CM/SW Discharge Note   Patient Details  Name: Marisa Bailey MRN: [id number] Date of Birth: [birth year]  Transition of Care [facility]) CM/SW Contact:  Iona Beard, [place] Phone Number: 01/12/2022, 11:21 AM   Clinical Narrative:    CSW updated of plan for D/C home today. CSW updated Lattie Haw with [facility] of plan for pt to D/C home. HH orders have been placed. TOC signing off.   Final next level of care: [place] Barriers to Discharge: Barriers Resolved   Patient Goals and CMS Choice Patient states their goals for this hospitalization and ongoing recovery are:: go home CMS Medicare.gov Compare Post Acute Care list provided to:: Patient Choice offered to / list presented to : Patient  Discharge Placement                       Discharge Plan and Services                          HH Arranged: RN [facility] Agency: [place] Date [place]: 01/12/22 Time [place]: 4707 Representative spoke with at [place]: [place] ([place]) Interventions     Readmission Risk Interventions    01/10/2022   10:57 AM  Readmission Risk Prevention Plan  Transportation Screening Complete  PCP or Specialist Appt within 3-5 Days Not Complete  HRI or [place] Complete  Social Work Consult for [place] Planning/Counseling Complete  Palliative Care Screening Not Applicable  Medication Review Press photographer) Complete

## 2022-01-12 NOTE — Progress Notes (Signed)
Pt. Oxygen saturation 95% on 4L via [place]. Bumped down to 2L, oxygen saturation 92%. Oxygen saturation down to 77% on 2L, post ambulation. Oxygen saturation back up to 94% on 2L [place] with deep breathing through pursed lips. MD notified.

## 2022-01-12 NOTE — Progress Notes (Signed)
PROGRESS NOTE     Marisa Bailey, is a 81 y.o. female, DOB - 1940-02-17, ZHY:[id number]  Admit date - 01/09/2022   Admitting Physician Kolette Vey Denton Brick, MD  Outpatient Primary MD for the patient is [name], MD  LOS - 3  Chief Complaint  Patient presents with   Shortness of Breath    SOB w/ Hypoxemia        Brief Narrative:  81 y.o. 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) on Eliquis admitted with acute on chronic diastolic dysfunction CHF exacerbation acute on chronic hypoxic respiratory failure  A/p 1)HFpEF--acute on chronic diastolic CHF exacerbation -Echo from 08/2021 with EF of 55 to 60% with grade 2 diastolic dysfunction moderate pulmonary hypertension -- On admission chest X-ray with mild pulmonary edema and cardiomegaly Troponin 6, repeat 5, EKG sinus rhythm without acute changes BNP 758 on admission, repeat BMP on 01/12/2021 is down to 251  -Continue daily weights, fluid input and output monitoring and REDs Vest  advised -Creatinine has improved, continue IV Lasix   2) acute on chronic symptomatic  anemia--- Hgb is down to 7.3 from 8.4 on admission -Patient with prior history of GI bleed -Recent EGD showed dieulafoy lesion cauterized,  -Continue Protonix -Monitor closely while on Xarelto -Hgb is up to 8.9 from 7.3 after 1 unit of PRBC on 01/10/2022 -Dyspnea on exertion and dizziness improving   3)H/o PE -diagnosed in July 2023--continue Xarelto, patient tells me she has remained compliant with Xarelto   4) acute on chronic hypoxic respiratory failure--at baseline patient supposed to be on 2 L of oxygen via nasal cannula at home but admits to noncompliance due to the need to keep smoking -Continue IV Lasix -Repeat chest x-ray on 01/12/2021 shows improvement 01/12/22- Oxygen saturation down to 77% on 2L,  post ambulation    5)Social/Ethics---Additional history obtained from patient's niece [name] at bedside -Patient is a full code   6)Tobacco Abuse--- she is not ready to quit smoking   7)HTN--- continue Amlodipine, -Duloxetine as needed elevated BP   Disposition/Need for in-Hospital Stay- patient unable to be discharged at this time due to -acute on chronic diastolic CHF exacerbation requiring IV diuresis and close monitoring of renal parameters and electrolytes while diuresing-  01/12/22--- -Oxygen saturation down to 77% on 2L, post ambulation   -Possible discharge home on 01/13/2022 with home health  Status is: Inpatient   Remains inpatient appropriate because:    Dispo: The patient is from: Home              Anticipated d/c is to: Home              Anticipated d/c date is: 1 days              Patient currently is not medically stable to d/c. Barriers: Not Clinically Stable-   Code Status :  -  Code Status: Full Code   Family Communication:   Discussed with Niece [name] -I called and updated patient's niece Ms. Denise Stubblefield DVT Prophylaxis  :   - SCDs  SCDs Start: 01/09/22 1108 Place TED hose Start: 01/09/22 1108 rivaroxaban (XARELTO) tablet 20 mg   Lab Results  Component Value Date   PLT 201 01/12/2022    Inpatient Medications  Scheduled Meds:  amLODipine  10 mg Oral Daily   atorvastatin  20 mg Oral  Daily   cholecalciferol  1,000 Units Oral Daily   folic acid  1 mg Oral Daily   furosemide  20 mg Intravenous Daily   insulin aspart  0-5 Units Subcutaneous QHS   insulin aspart  0-9 Units Subcutaneous TID WC   pantoprazole  40 mg Oral Q breakfast   rivaroxaban  20 mg Oral Q supper   sodium chloride flush  3 mL Intravenous Q12H   sodium chloride flush  3 mL Intravenous Q12H   zinc sulfate  220 mg Oral Daily   Continuous Infusions:  sodium chloride     PRN Meds:.sodium chloride, acetaminophen **OR** acetaminophen, albuterol, bisacodyl, hydrALAZINE,  ondansetron **OR** ondansetron (ZOFRAN) IV, polyethylene glycol, sodium chloride flush, traZODone   Anti-infectives (From admission, onward)    None        Subjective: Marisa Bailey today has no fevers, no emesis,  No chest pain,   No fever  Or chills   01/12/22-- DOE is improving...  Oxygen saturation down to 77% on 2L, post ambulation   Objective: Vitals:   01/11/22 2105 01/12/22 0552 01/12/22 0815 01/12/22 1450  BP: 106/64 (!) 130/53 139/61 (!) 116/59  Pulse: 70 73 68 72  Resp: _0 (!) 24  Temp: 98.2 F (36.8 C) 98.4 F (36.9 C) 98.3 F (36.8 C) 98.7 F (37.1 C)  TempSrc:   Oral   SpO2: 98% 94% 93% 92%  Weight:  59.5 kg    Height:        Intake/Output Summary (Last 24 hours) at 01/12/2022 1548 Last data filed at 01/12/2022 1002 Gross per 24 hour  Intake 720 ml  Output 1300 ml  Net -580 ml   Filed Weights   01/10/22 0505 01/11/22 0500 01/12/22 0552  Weight: 61.1 kg 59.1 kg 59.5 kg    Physical Exam  Physical Examination: General appearance - alert,  in no distress  Mental status - alert, oriented to person, place, and time,  Nose- [place] 4L/min (Oxygen saturation down to 77% on 2L, post ambulation ) Eyes -enucleated right eye  neck - supple, no JVD elevation , Chest -improving air movement, no wheezing  heart - S1 and S2 normal, regular  Abdomen - soft, nontender, nondistended, +BS Neurological - screening mental status exam normal, neck supple without rigidity, cranial nerves II through XII intact, DTR's normal and symmetric Extremities -improving pedal edema noted, intact peripheral pulses  Skin - warm, dry  Data Reviewed: I have personally reviewed following labs and imaging studies  CBC: Recent Labs  Lab 01/09/22 0938 01/10/22 0320 01/11/22 0356 01/12/22 0350  WBC 4.9 5.2 5.8 5.4  HGB 8.4* 7.3* 8.7* 8.9*  HCT 29.9* 25.8* 30.2* 31.8*  MCV 76.3* 75.9* 75.7* 76.4*  PLT 198 170 188 607   Basic Metabolic Panel: Recent Labs  Lab 01/09/22 0938  01/10/22 0320 01/11/22 0356 01/12/22 0350  NA 142 141 142 142  K 3.5 3.9 3.2* 4.0  CL 104 104 97* 101  CO2 26 30 34* 34*  GLUCOSE 102* 86 75 87  BUN _1 25*  CREATININE 0.90 1.46* 1.09* 1.13*  CALCIUM 8.7* 8.1* 7.8* 7.4*  PHOS  --   --  3.6  --    GFR: Estimated Creatinine Clearance: 33.7 mL/min (A) (by C-G formula based on SCr of 1.13 mg/dL (H)). Liver Function Tests: Recent Labs  Lab 01/09/22 0938 01/11/22 0356  AST 22  --   ALT 11  --   ALKPHOS 71  --   BILITOT  0.5  --   PROT 7.5  --   ALBUMIN 3.5 3.1*    Radiology Studies: DG Chest 2 View  Result Date: 01/12/2022 CLINICAL DATA:  Shortness of breath EXAM: CHEST - 2 VIEW COMPARISON:  01/09/2022 FINDINGS: Cardiomediastinal silhouette is not significantly changed. Improved bilateral LOWER lung aeration/opacities noted with decreased small effusions. Pulmonary vascular congestion again identified. There is no evidence of pneumothorax. No other significant changes are noted. IMPRESSION: Improved bilateral LOWER lung aeration with decreased bilateral LOWER lung opacities/atelectasis and small effusions. Electronically Signed   By: Margarette Canada M.D.   On: 01/12/2022 08:03    Scheduled Meds:  amLODipine  10 mg Oral Daily   atorvastatin  20 mg Oral Daily   cholecalciferol  1,000 Units Oral Daily   folic acid  1 mg Oral Daily   furosemide  20 mg Intravenous Daily   insulin aspart  0-5 Units Subcutaneous QHS   insulin aspart  0-9 Units Subcutaneous TID WC   pantoprazole  40 mg Oral Q breakfast   rivaroxaban  20 mg Oral Q supper   sodium chloride flush  3 mL Intravenous Q12H   sodium chloride flush  3 mL Intravenous Q12H   zinc sulfate  220 mg Oral Daily   Continuous Infusions:  sodium chloride       LOS: 3 days   Roxan Hockey M.D on 01/12/2022 at 3:48 PM  Go to [email or web address] - for contact info  [facility] - Office  [phone]  If 7PM-7AM, please contact night-coverage [email or web address] 01/12/2022, 3:48  PM

## 2022-01-12 NOTE — Progress Notes (Signed)
Pt A&O x 2. Orientation seems to go in and out. Pt slept through the night, vitals stable. Lung sounds clear.

## 2022-01-13 DIAGNOSIS — I5030 Unspecified diastolic (congestive) heart failure: Secondary | ICD-10-CM | POA: Diagnosis not present

## 2022-01-13 DIAGNOSIS — I5023 Acute on chronic systolic (congestive) heart failure: Secondary | ICD-10-CM

## 2022-01-13 DIAGNOSIS — Z720 Tobacco use: Secondary | ICD-10-CM | POA: Diagnosis not present

## 2022-01-13 LAB — BASIC METABOLIC PANEL
Anion gap: 9 (ref 5–15)
BUN: 20 mg/dL (ref 8–23)
CO2: 32 mmol/L (ref 22–32)
Calcium: 8 mg/dL — ABNORMAL LOW (ref 8.9–10.3)
Chloride: 98 mmol/L (ref 98–111)
Creatinine, Ser: 1.07 mg/dL — ABNORMAL HIGH (ref 0.44–1.00)
GFR, Estimated: 52 mL/min — ABNORMAL LOW (ref >60)
Glucose, Bld: 186 mg/dL — ABNORMAL HIGH (ref 70–99)
Potassium: 3.7 mmol/L (ref 3.5–5.1)
Sodium: 139 mmol/L (ref 135–145)

## 2022-01-13 LAB — GLUCOSE, CAPILLARY
Glucose-Capillary: 100 mg/dL — ABNORMAL HIGH (ref 70–99)
Glucose-Capillary: 91 mg/dL (ref 70–99)
Glucose-Capillary: 128 mg/dL — ABNORMAL HIGH (ref 70–99)
Glucose-Capillary: 143 mg/dL — ABNORMAL HIGH (ref 70–99)

## 2022-01-13 MED ORDER — FUROSEMIDE 10 MG/ML IJ SOLN
20.0000 mg | Freq: Once | INTRAMUSCULAR | Status: AC
  Administered 2022-01-13: 20 mg via INTRAVENOUS
  Filled 2022-01-13: qty 2

## 2022-01-13 NOTE — Progress Notes (Signed)
Pt A&O X 3, disoriented to time. Pt slept through the night and reported 0/10 pain.

## 2022-01-13 NOTE — Progress Notes (Signed)
Pt. Oxygen saturation 83% at rest, on 2L via [place]. Encourage pt. To deep breathe, oxygen saturation 92%. Oxygen saturation 78% on 2L post ambulation. Encouraged pt. To deep breathe, oxygen saturation now 94% on 2L via [place]. MD notified with instructions to move pt. To 3L via [place].

## 2022-01-13 NOTE — Progress Notes (Signed)
Pt. Oxygen at 0.5L via [place] satting at 71% upon assessment. Moved pt. Back to 2L (chronic), oxygen saturation up to 96%. MD notified.

## 2022-01-13 NOTE — Progress Notes (Signed)
PROGRESS NOTE     Marisa Bailey, is a 81 y.o. female, DOB - March 20, 1940, [id number]  Admit date - 01/09/2022   Admitting Physician Jeffren Dombek Denton Brick, MD  Outpatient Primary MD for the patient is [name], MD  LOS - 4  Chief Complaint  Patient presents with   Shortness of Breath    SOB w/ Hypoxemia        Brief Narrative:  81 y.o. 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  oxygen saturation 92%. Oxygen saturation 78% on 2L post ambulation.   A/p 1)HFpEF--acute on chronic diastolic CHF exacerbation -Echo from 08/2021 with EF of 55 to 60% with grade 2 diastolic dysfunction moderate pulmonary hypertension -- On admission chest X-ray with mild pulmonary edema and cardiomegaly Troponin 6, repeat 5, EKG sinus rhythm without acute changes BNP 758 on admission, repeat BMP on 01/12/2021 is down to 251  -Continue daily weights, fluid input and output monitoring and REDs Vest  advised -Creatinine has improved, -01/13/22 -Episodes of desaturation persist---Oxygen saturation 83% at rest, on 2L via [place].  oxygen saturation 92%. Oxygen saturation 78% on 2L post ambulation.  -IV Lasix as ordered -Repeat chest x-ray in am   2) acute on chronic symptomatic  anemia--- Hgb is down to 7.3 from 8.4 on admission -Patient with prior history of GI bleed -Recent EGD showed dieulafoy lesion cauterized,  -Continue Protonix -Monitor closely while on Xarelto -Hgb is up to 8.9 from 7.3 after 1 unit of PRBC on 01/10/2022 -Dyspnea on exertion and dizziness improving   3)H/o PE -diagnosed in July 2023--continue Xarelto, patient  tells me she has remained compliant with Xarelto   4) acute on chronic hypoxic respiratory failure--at baseline patient supposed to be on 2 L of oxygen via nasal cannula at home but admits to noncompliance due to the need to keep smoking -Continue IV Lasix -Repeat chest x-ray on 01/12/2021 shows improvement  01/13/22- -persistent episodes of desaturation as noted above   5)Social/Ethics---Additional history obtained from patient's niece [name]  -Patient is a full code   6)Tobacco Abuse--- she is not ready to quit smoking   7)HTN--- continue Amlodipine, -Duloxetine as needed elevated BP   Disposition/Need for in-Hospital Stay- patient unable to be discharged at this time due to -acute on chronic diastolic CHF exacerbation requiring IV diuresis and close monitoring of renal parameters and electrolytes while diuresing-  \ 01/13/22 -Episodes of desaturation persist---Oxygen saturation 83% at rest, on 2L via .  oxygen saturation 92%. Oxygen saturation 78% on 2L post ambulation.   -Possible discharge home on 01/13/2022 with home health  Status is: Inpatient   Remains inpatient appropriate because:    Dispo: The patient is from: Home              Anticipated d/c is to: Home              Anticipated d/c date is: 1 days              Patient currently is not medically stable to d/c. Barriers: Not Clinically Stable-   Code Status :  -  Code Status: Full Code   Family Communication:   Discussed with Niece  [name] -I called and updated patient's niece Ms. Denise Stubblefield DVT Prophylaxis  :   - SCDs  SCDs Start: 01/09/22 1108 Place TED hose Start: 01/09/22 1108 rivaroxaban (XARELTO) tablet 20 mg   Lab Results  Component Value Date   PLT 201 01/12/2022    Inpatient Medications  Scheduled Meds:  amLODipine  10 mg Oral Daily   atorvastatin  20 mg Oral Daily   cholecalciferol  1,000 Units Oral Daily   folic acid  1 mg Oral Daily   furosemide  20 mg Intravenous Daily   insulin  aspart  0-5 Units Subcutaneous QHS   insulin aspart  0-9 Units Subcutaneous TID WC   pantoprazole  40 mg Oral Q breakfast   rivaroxaban  20 mg Oral Q supper   sodium chloride flush  3 mL Intravenous Q12H   sodium chloride flush  3 mL Intravenous Q12H   zinc sulfate  220 mg Oral Daily   Continuous Infusions:  sodium chloride     PRN Meds:.sodium chloride, acetaminophen **OR** acetaminophen, albuterol, bisacodyl, hydrALAZINE, ondansetron **OR** ondansetron (ZOFRAN) IV, polyethylene glycol, sodium chloride flush, traZODone   Anti-infectives (From admission, onward)    None        Subjective: Jelene Albano today has no fevers, no emesis,  No chest pain,   No fever  Or chills   01/13/22 -Episodes of desaturation persist---Oxygen saturation 83% at rest, on 2L via [place].  oxygen saturation 92%. Oxygen saturation 78% on 2L post ambulation.  - Voiding well -Dyspnea on exertion improving  Objective: Vitals:   01/13/22 0415 01/13/22 0838 01/13/22 1338 01/13/22 1934  BP: (!) 116/53 (!) 153/75 (!) 114/50 132/66  Pulse: 68 78 74 83  Resp: _0 Temp: 99 F (37.2 C)  98.8 F (37.1 C) 99.9 F (37.7 C)  TempSrc: Oral   Oral  SpO2: 100%  98% 94%  Weight: 60.1 kg     Height:        Intake/Output Summary (Last 24 hours) at 01/13/2022 1945 Last data filed at 01/13/2022 1522 Gross per 24 hour  Intake 483 ml  Output 1750 ml  Net -1267 ml   Filed Weights   01/11/22 0500 01/12/22 0552 01/13/22 0415  Weight: 59.1 kg 59.5 kg 60.1 kg    Physical Exam  Physical Examination: General appearance - alert,  in no distress  Mental status - alert, oriented to person, place, and time,  Nose- [place] 4L/min (Oxygen saturation down to 83% on 2L, post ambulation ) Eyes -enucleated right eye  neck - supple, no JVD elevation , Chest -improving air movement, no wheezing  heart - S1 and S2 normal, regular  Abdomen - soft, nontender, nondistended, +BS Neurological - screening mental status exam  normal, neck supple without rigidity, cranial nerves II through XII intact, DTR's normal and symmetric Extremities -improving pedal edema noted, intact peripheral pulses  Skin - warm, dry  Data Reviewed: I have personally reviewed following labs and imaging studies  CBC: Recent Labs  Lab 01/09/22 0938 01/10/22 0320 01/11/22 0356 01/12/22 0350  WBC 4.9 5.2 5.8 5.4  HGB 8.4* 7.3* 8.7* 8.9*  HCT 29.9* 25.8* 30.2* 31.8*  MCV 76.3* 75.9* 75.7* 76.4*  PLT 198 170 188 159   Basic Metabolic Panel: Recent Labs  Lab 01/09/22 0938 01/10/22 0320 01/11/22 0356 01/12/22 0350 01/13/22 1013  NA 142 141 142 142 139  K 3.5 3.9 3.2* 4.0 3.7  CL 104 104 97* 101 98  CO2 26 30  34* 34* 32  GLUCOSE 102* 86 75 87 186*  BUN _0 25* 20  CREATININE 0.90 1.46* 1.09* 1.13* 1.07*  CALCIUM 8.7* 8.1* 7.8* 7.4* 8.0*  PHOS  --   --  3.6  --   --    GFR: Estimated Creatinine Clearance: 35.6 mL/min (A) (by C-G formula based on SCr of 1.07 mg/dL (H)). Liver Function Tests: Recent Labs  Lab 01/09/22 0938 01/11/22 0356  AST 22  --   ALT 11  --   ALKPHOS 71  --   BILITOT 0.5  --   PROT 7.5  --   ALBUMIN 3.5 3.1*    Radiology Studies: DG Chest 2 View  Result Date: 01/12/2022 CLINICAL DATA:  Shortness of breath EXAM: CHEST - 2 VIEW COMPARISON:  01/09/2022 FINDINGS: Cardiomediastinal silhouette is not significantly changed. Improved bilateral LOWER lung aeration/opacities noted with decreased small effusions. Pulmonary vascular congestion again identified. There is no evidence of pneumothorax. No other significant changes are noted. IMPRESSION: Improved bilateral LOWER lung aeration with decreased bilateral LOWER lung opacities/atelectasis and small effusions. Electronically Signed   By: Margarette Canada M.D.   On: 01/12/2022 08:03    Scheduled Meds:  amLODipine  10 mg Oral Daily   atorvastatin  20 mg Oral Daily   cholecalciferol  1,000 Units Oral Daily   folic acid  1 mg Oral Daily   furosemide   20 mg Intravenous Daily   insulin aspart  0-5 Units Subcutaneous QHS   insulin aspart  0-9 Units Subcutaneous TID WC   pantoprazole  40 mg Oral Q breakfast   rivaroxaban  20 mg Oral Q supper   sodium chloride flush  3 mL Intravenous Q12H   sodium chloride flush  3 mL Intravenous Q12H   zinc sulfate  220 mg Oral Daily   Continuous Infusions:  sodium chloride       LOS: 4 days   Roxan Hockey M.D on 01/13/2022 at 7:45 PM  Go to [email or web address] - for contact info  [facility] - Office  [phone]  If 7PM-7AM, please contact night-coverage [email or web address] 01/13/2022, 7:45 PM

## 2022-01-14 ENCOUNTER — Inpatient Hospital Stay (HOSPITAL_COMMUNITY): Payer: Medicare Other

## 2022-01-14 DIAGNOSIS — I5032 Chronic diastolic (congestive) heart failure: Secondary | ICD-10-CM

## 2022-01-14 LAB — CBC
HCT: 31.9 % — ABNORMAL LOW (ref 36.0–46.0)
Hemoglobin: 9.2 g/dL — ABNORMAL LOW (ref 12.0–15.0)
MCH: 21.9 pg — ABNORMAL LOW (ref 26.0–34.0)
MCHC: 28.8 g/dL — ABNORMAL LOW (ref 30.0–36.0)
MCV: 75.8 fL — ABNORMAL LOW (ref 80.0–100.0)
Platelets: 223 10*3/uL (ref 150–400)
RBC: 4.21 MIL/uL (ref 3.87–5.11)
RDW: 21 % — ABNORMAL HIGH (ref 11.5–15.5)
WBC: 6.8 10*3/uL (ref 4.0–10.5)
nRBC: 0 % (ref 0.0–0.2)

## 2022-01-14 LAB — RENAL FUNCTION PANEL
Albumin: 3 g/dL — ABNORMAL LOW (ref 3.5–5.0)
Anion gap: 9 (ref 5–15)
BUN: 20 mg/dL (ref 8–23)
CO2: 35 mmol/L — ABNORMAL HIGH (ref 22–32)
Calcium: 7.7 mg/dL — ABNORMAL LOW (ref 8.9–10.3)
Chloride: 96 mmol/L — ABNORMAL LOW (ref 98–111)
Creatinine, Ser: 1.02 mg/dL — ABNORMAL HIGH (ref 0.44–1.00)
GFR, Estimated: 55 mL/min — ABNORMAL LOW (ref >60)
Glucose, Bld: 87 mg/dL (ref 70–99)
Phosphorus: 4.8 mg/dL — ABNORMAL HIGH (ref 2.5–4.6)
Potassium: 3.4 mmol/L — ABNORMAL LOW (ref 3.5–5.1)
Sodium: 140 mmol/L (ref 135–145)

## 2022-01-14 LAB — GLUCOSE, CAPILLARY
Glucose-Capillary: 108 mg/dL — ABNORMAL HIGH (ref 70–99)
Glucose-Capillary: 101 mg/dL — ABNORMAL HIGH (ref 70–99)

## 2022-01-14 MED ORDER — IPRATROPIUM-ALBUTEROL 0.5-2.5 (3) MG/3ML IN SOLN: 3.0000 mL | RESPIRATORY_TRACT | 2 refills | Status: AC | PRN

## 2022-01-14 MED ORDER — PREDNISONE 20 MG PO TABS: 40.0000 mg | ORAL_TABLET | Freq: Every day | ORAL | 0 refills | Status: AC

## 2022-01-14 MED ORDER — BLOOD GLUCOSE MONITOR KIT: PACK | 0 refills | Status: AC

## 2022-01-14 MED ORDER — RIVAROXABAN 20 MG PO TABS: 20.0000 mg | ORAL_TABLET | Freq: Every day | ORAL | 5 refills | Status: AC

## 2022-01-14 MED ORDER — POTASSIUM CHLORIDE CRYS ER 20 MEQ PO TBCR
40.0000 meq | EXTENDED_RELEASE_TABLET | ORAL | Status: AC
  Administered 2022-01-14 (×2): 40 meq via ORAL
  Filled 2022-01-14 (×2): qty 2

## 2022-01-14 MED ORDER — ALBUTEROL SULFATE (2.5 MG/3ML) 0.083% IN NEBU: 2.5000 mg | INHALATION_SOLUTION | RESPIRATORY_TRACT | 12 refills | Status: AC | PRN

## 2022-01-14 MED ORDER — DOXYCYCLINE HYCLATE 100 MG PO TABS: 100.0000 mg | ORAL_TABLET | Freq: Two times a day (BID) | ORAL | 0 refills | Status: AC

## 2022-01-14 MED ORDER — ZINC SULFATE 220 (50 ZN) MG PO CAPS: 220.0000 mg | ORAL_CAPSULE | Freq: Every day | ORAL | 1 refills | Status: AC

## 2022-01-14 MED ORDER — FOLIC ACID 1 MG PO TABS: 1.0000 mg | ORAL_TABLET | Freq: Every day | ORAL | 2 refills | Status: AC

## 2022-01-14 MED ORDER — PREDNISONE 20 MG PO TABS
50.0000 mg | ORAL_TABLET | Freq: Once | ORAL | Status: AC
  Administered 2022-01-14: 50 mg via ORAL
  Filled 2022-01-14: qty 3

## 2022-01-14 MED ORDER — AMLODIPINE BESYLATE 5 MG PO TABS: 5.0000 mg | ORAL_TABLET | Freq: Every day | ORAL | 3 refills | Status: AC

## 2022-01-14 MED ORDER — METFORMIN HCL 500 MG PO TABS: 500.0000 mg | ORAL_TABLET | Freq: Two times a day (BID) | ORAL | 1 refills | Status: AC

## 2022-01-14 MED ORDER — ALBUTEROL SULFATE HFA 108 (90 BASE) MCG/ACT IN AERS: 2.0000 | INHALATION_SPRAY | Freq: Four times a day (QID) | RESPIRATORY_TRACT | 2 refills | Status: AC | PRN

## 2022-01-14 MED ORDER — BLOOD GLUCOSE METER KIT: PACK | 1 refills | Status: AC

## 2022-01-14 MED ORDER — POTASSIUM CHLORIDE ER 20 MEQ PO TBCR: 20.0000 meq | EXTENDED_RELEASE_TABLET | Freq: Every day | ORAL | 1 refills | Status: DC

## 2022-01-14 MED ORDER — COMPRESSOR/NEBULIZER MISC: 1.0000 [IU] | Freq: Every day | 0 refills | Status: AC | PRN

## 2022-01-14 MED ORDER — ATORVASTATIN CALCIUM 20 MG PO TABS: 20.0000 mg | ORAL_TABLET | Freq: Every day | ORAL | 3 refills | Status: DC

## 2022-01-14 MED ORDER — PANTOPRAZOLE SODIUM 40 MG PO TBEC: 40.0000 mg | DELAYED_RELEASE_TABLET | Freq: Every day | ORAL | 3 refills | Status: AC

## 2022-01-14 MED ORDER — FUROSEMIDE 20 MG PO TABS: 20.0000 mg | ORAL_TABLET | Freq: Two times a day (BID) | ORAL | 3 refills | Status: AC

## 2022-01-14 NOTE — TOC Transition Note (Addendum)
Transition of Care [facility]) - CM/SW Discharge Note   Patient Details  Name: Marisa Bailey MRN: [id number] Date of Birth: 13-Jan-1941  [name], RN Phone Number: 01/14/2022, 12:10 PM   Clinical Narrative:   Patient discharging home today. Used home oxygen with [employer]. Erasmo Downer updated with oxygen order changes, and neb machine ordered.  Patient needs a tank to transport home. TOC waiting on ETA from [employer].   Addendum: Niece will bring a tank from home to transport.   Final next level of care: [place] Barriers to Discharge: Continued Medical Work up   Patient Goals and CMS Choice Patient states their goals for this hospitalization and ongoing recovery are:: go home CMS Medicare.gov Compare Post Acute Care list provided to:: Patient Choice offered to / list presented to : Patient  Discharge Placement      Patient and family notified of of transfer: 01/14/22  Discharge Plan and Services               DME Arranged: Nebulizer/meds DME Agency: [employer] Date DME Agency Contacted: 01/14/22 Time DME Agency Contacted: 1209 Representative spoke with at DME Agency: Gwendalyn Ege Arranged: RN [facility] Agency: [place] Date [place]: 01/12/22 Time [place]: 1315 Representative spoke with at [place]: Lattie Haw   Readmission Risk Interventions    01/10/2022   10:57 AM  Readmission Risk Prevention Plan  Transportation Screening Complete  PCP or Specialist Appt within 3-5 Days Not Complete  HRI or [place] Complete  Social Work Consult for [place] Planning/Counseling Complete  Palliative Care Screening Not Applicable  Medication Review Press photographer) Complete

## 2022-01-14 NOTE — Progress Notes (Signed)
Pt O2 sat at rest 2.5 L [place] 95%. O2 saturation 85% on 2.5L while ambulating. Increased O2 to 3L sat at 87. Increased O2 to 4L pt saturation is 92%. MD notified.

## 2022-01-14 NOTE — Progress Notes (Signed)
Pt's niece, Ileene Musa, called to come pick up pt for discharge. [phone])

## 2022-01-14 NOTE — Progress Notes (Signed)
Pt A&O x 3. Vitals stable. I started to change IV dressing and it had a foul odor and discharge coming from the site. I removed the IV and cleaned the area. NEW IV inserted in LFA. Pt reported 6/10 pain in lift hip, PRN tylenol given. Pt slept through the night and there were no more reports of pain.

## 2022-01-14 NOTE — Progress Notes (Signed)
Ng Discharge Note  Admit Date:  01/09/2022 Discharge date: 01/14/2022   Marisa Bailey to be D/C'd Home per MD order.  AVS completed. Patient/caregiver able to verbalize understanding.  Discharge Medication: Allergies as of 01/14/2022       Reactions   Penicillins Rash   Has patient had a PCN reaction causing immediate rash, facial/tongue/throat swelling, SOB or lightheadedness with hypotension: {Yes/No:[id number] Has patient had a PCN reaction causing severe rash involving mucus membranes or skin necrosis: Yes Has patient had a PCN reaction that required hospitalization No Has patient had a PCN reaction occurring within the last 10 years: No If all of the above answers are "NO", then may proceed with Cephalosporin use. **Has tolerated keflex        Medication List     STOP taking these medications    zinc gluconate 50 MG tablet Replaced by: zinc sulfate 220 (50 Zn) MG capsule       TAKE these medications    albuterol 108 (90 Base) MCG/ACT inhaler Commonly known as: VENTOLIN HFA Inhale 2 puffs into the lungs every 6 (six) hours as needed for wheezing or shortness of breath. What changed: Another medication with the same name was added. Make sure you understand how and when to take each.   albuterol (2.5 MG/3ML) 0.083% nebulizer solution Commonly known as: PROVENTIL Take 3 mLs (2.5 mg total) by nebulization every 2 (two) hours as needed for wheezing or shortness of breath. What changed: You were already taking a medication with the same name, and this prescription was added. Make sure you understand how and when to take each.   amLODipine 5 MG tablet Commonly known as: NORVASC Take 1 tablet (5 mg total) by mouth daily. What changed:  medication strength how much to take   atorvastatin 20 MG tablet Commonly known as: LIPITOR Take 1 tablet (20 mg total) by mouth daily.   blood glucose meter kit and supplies Dispense based on patient and insurance preference. Use up  to four times daily as directed. (FOR ICD-9 250.00, 250.01).   blood glucose meter kit and supplies Kit Dispense based on patient and insurance preference. Use up to four times daily as directed.   Compressor/Nebulizer Misc 1 Units by Does not apply route daily as needed.   doxycycline 100 MG tablet Commonly known as: VIBRA-TABS Take 1 tablet (100 mg total) by mouth 2 (two) times daily for 5 days.   folic acid 1 MG tablet Commonly known as: FOLVITE Take 1 tablet (1 mg total) by mouth daily.   furosemide 20 MG tablet Commonly known as: Lasix Take 1 tablet (20 mg total) by mouth 2 (two) times daily.   ipratropium-albuterol 0.5-2.5 (3) MG/3ML Soln Commonly known as: DUONEB Take 3 mLs by nebulization every 4 (four) hours as needed.   metFORMIN 500 MG tablet Commonly known as: GLUCOPHAGE Take 1 tablet (500 mg total) by mouth 2 (two) times daily.   pantoprazole 40 MG tablet Commonly known as: PROTONIX Take 1 tablet (40 mg total) by mouth daily. What changed: additional instructions   Potassium Chloride ER 20 MEQ Tbcr Take 20 mEq by mouth daily. 1 tab daily by mouth--- take while taking Lasix/furosemide   prednisoLONE acetate 1 % ophthalmic suspension Commonly known as: PRED FORTE 1 drop 2 (two) times daily.   predniSONE 20 MG tablet Commonly known as: DELTASONE Take 2 tablets (40 mg total) by mouth daily with breakfast for 5 days.   rivaroxaban 20 MG Tabs tablet Commonly known  as: XARELTO Take 1 tablet (20 mg total) by mouth daily with supper.   Vitamin D3 25 MCG (1000 UT) Caps Take 1 capsule by mouth daily.   zinc sulfate 220 (50 Zn) MG capsule Take 1 capsule (220 mg total) by mouth daily. Start taking on: January 15, 2022 Replaces: zinc gluconate 50 MG tablet               Durable Medical Equipment  (From admission, onward)           Start     Ordered   01/14/22 1039  For home use only DME oxygen  Once       Comments: SATURATION QUALIFICATIONS:  (This note is used to comply with regulatory documentation for home oxygen)   Patient Saturations on Room Air at Rest = 85 %   Patient Saturations on Room Air while Ambulating = 78%   Patient Saturations on 2.5 Liters of oxygen while Ambulating = 85%   Patient Saturations on 4 Liters of oxygen while Ambulating = 92%     Patient needs continuous O2 at 4 L/min continuously via nasal cannula with humidifier, with gaseous portability and conserving device  Question Answer Comment  Length of Need Lifetime   Mode or (Route) Nasal cannula   Liters per Minute 4   Frequency Continuous (stationary and portable oxygen unit needed)   Oxygen conserving device Yes   Oxygen delivery system Gas      01/14/22 1038   01/14/22 0000  For home use only DME Nebulizer machine       Question Answer Comment  Patient needs a nebulizer to treat with the following condition Dyspnea and respiratory abnormalities   Length of Need Lifetime      01/14/22 1059            Discharge Assessment: Vitals:   01/13/22 1934 01/14/22 0447  BP: 132/66 (!) 104/45  Pulse: 83 65  Resp: 18 (!) 24  Temp: 99.9 F (37.7 C) 98.2 F (36.8 C)  SpO2: 94% 91%   Skin clean, dry and intact without evidence of skin break down, no evidence of skin tears noted. IV catheter discontinued intact. Site without signs and symptoms of complications - no redness or edema noted at insertion site, patient denies c/o pain - only slight tenderness at site.  Dressing with slight pressure applied.  D/c Instructions-Education: Discharge instructions given to patient/family with verbalized understanding. D/c education completed with patient/family including follow up instructions, medication list, d/c activities limitations if indicated, with other d/c instructions as indicated by MD - patient able to verbalize understanding, all questions fully answered. Patient instructed to return to ED, call 911, or call MD for any changes in condition.   Patient escorted via [place], and D/C home via private auto.  Tsosie Billing, LPN 96/08/2895 9:15 PM

## 2022-01-14 NOTE — Progress Notes (Signed)
SATURATION QUALIFICATIONS: (This note is used to comply with regulatory documentation for home oxygen)   Patient Saturations on Room Air at Rest = 85 %   Patient Saturations on Room Air while Ambulating = 78%   Patient Saturations on 2.5 Liters of oxygen while Ambulating = 85%   Patient Saturations on 4 Liters of oxygen while Ambulating = 92%     Patient needs continuous O2 at 4 L/min continuously via nasal cannula with humidifier, with gaseous portability and conserving device    Roxan Hockey, MD

## 2022-01-14 NOTE — Discharge Instructions (Signed)
1)Very low-salt diet advised----less than 2 g of sodium daily advised 2)Weigh yourself daily, call if you gain more than 3 pounds in 1 day or more than 5 pounds in 1 week as your diuretic medications may need to be adjusted 3)Limit your Fluid  intake to no more than 60 ounces (1.8 Liters) per day 4)you need oxygen at home at 4 L via nasal cannula continuously while awake and while asleep--- smoking or having open fires around oxygen can cause fire, significant injury and death 5)Repeat CBC and BMP blood tests within 1 week----Your Lasix/furosemide and potassium dose may have to be adjusted 6) complete abstinence from tobacco advised 7) please note that there has been several changes to your medications

## 2022-01-14 NOTE — Discharge Summary (Signed)
Marisa Bailey, is a 81 y.o. female  DOB 10-24-1940  MRN [id number].  Admission date:  01/09/2022  Admitting Physician  Roxan Hockey, MD  Discharge Date:  01/14/2022   Primary MD  [name], MD  Recommendations for primary care physician for things to follow:  1)Very low-salt diet advised----less than 2 g of sodium daily advised 2)Weigh yourself daily, call if you gain more than 3 pounds in 1 day or more than 5 pounds in 1 week as your diuretic medications may need to be adjusted 3)Limit your Fluid  intake to no more than 60 ounces (1.8 Liters) per day 4)you need oxygen at home at 4 L via nasal cannula continuously while awake and while asleep--- smoking or having open fires around oxygen can cause fire, significant injury and death 5)Repeat CBC and BMP blood tests within 1 week----Your Lasix/furosemide and potassium dose may have to be adjusted 6) complete abstinence from tobacco advised 7) please note that there has been several changes to your medications  Admission Diagnosis  SOB (shortness of breath) [R06.02] Acute exacerbation of CHF (congestive heart failure) ([place]) [I50.9]   Discharge Diagnosis  SOB (shortness of breath) [R06.02] Acute exacerbation of CHF (congestive heart failure) ([place]) [I50.9]    Active Problems:   Pulmonary emboli (HCC)   Chronic respiratory failure with hypoxia (HCC)   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[place])   Essential hypertension   Blindness of right eye   Tobacco abuse   (HFpEF) heart failure with preserved ejection fraction (HCC)   Bronchiectasis with acute lower respiratory infection ([place])   Acute exacerbation of CHF (congestive heart failure) ([place])      Past Medical History:  Diagnosis Date   Aspiration pneumonia ([place]) 11/03/2020   Blind right eye    CHF (congestive heart failure) (HCC)    COPD (chronic obstructive pulmonary disease) ([place])     Diabetes mellitus without complication ([place])    Fx upper humerus-closed 08/17/2012   H/O blood clots    lungs   Hypertension     Past Surgical History:  Procedure Laterality Date   CATARACT EXTRACTION W/PHACO Left 09/24/2021   Procedure: CATARACT EXTRACTION PHACO AND INTRAOCULAR LENS PLACEMENT ([place]);  Surgeon: Baruch Goldmann, MD;  Location: [facility];  Service: Ophthalmology;  Laterality: Left;  CDE: 8.08   ENUCLEATION     ESOPHAGOGASTRODUODENOSCOPY (EGD) WITH PROPOFOL N/A 10/25/2021   Procedure: ESOPHAGOGASTRODUODENOSCOPY (EGD) WITH PROPOFOL;  Surgeon: Harvel Quale, MD;  Location: [facility];  Service: Gastroenterology;  Laterality: N/A;   HEMOSTASIS CLIP PLACEMENT  10/25/2021   Procedure: HEMOSTASIS CLIP PLACEMENT;  Surgeon: Harvel Quale, MD;  Location: [facility];  Service: Gastroenterology;;   HOT HEMOSTASIS  10/25/2021   Procedure: HOT HEMOSTASIS (ARGON PLASMA COAGULATION/BICAP);  Surgeon: Montez Morita, Quillian Quince, MD;  Location: [facility];  Service: Gastroenterology;;   SUBMUCOSAL INJECTION  10/25/2021   Procedure: SUBMUCOSAL INJECTION;  Surgeon: Harvel Quale, MD;  Location: [facility];  Service: Gastroenterology;;     HPI  from the history and physical done on the day of admission:  Marisa Bailey  is a 81 y.o. 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) on Eliquis presents to the ED with increasing shortness of breath- -in the ED oxygen saturations dropped to 74 % on room air and 84% on 2 L patient was bumped up to 4 L of oxygen with O2 sats at 92% -Additional history obtained from patient's niece [name] at bedside No fever  Or chills  Had no specific chest discomfort as well as epigastric discomfort which is since resolved No Nausea, Vomiting or Diarrhea -She has been  compliant with Xarelto -Chest X-ray with mild pulmonary edema and cardiomegaly Troponin 6, repeat 5, EKG sinus rhythm without acute changes BNP 758 much higher than prior baseline -Creatinine 0.90 -Hgb 8.4    Hospital Course:     Brief Narrative:  81 y.o. 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) on Eliquis admitted with acute on chronic diastolic dysfunction CHF exacerbation acute on chronic hypoxic respiratory failure   A/p 1)HFpEF--acute on chronic diastolic CHF exacerbation -Echo from 08/2021 with EF of 55 to 60% with grade 2 diastolic dysfunction moderate pulmonary hypertension -- On admission chest X-ray with mild pulmonary edema and cardiomegaly Troponin 6, repeat 5, EKG sinus rhythm without acute changes BNP 758 on admission, repeat BNP on 01/12/2021 is down to 251  -Improved after additional IV diuresis -We discharged home on diuretics and oxygen and discharge instructions   2) acute on chronic symptomatic  anemia--- Hgb was initially down to 7.3 from 8.4 on admission -Patient with prior history of GI bleed -Recent EGD showed dieulafoy lesion cauterized,  -Continue Protonix -Monitor closely while on Xarelto -Hgb is up to 8.9 from 7.3 after 1 unit of PRBC on 01/10/2022 -Dyspnea on exertion and dizziness improved   3)H/o PE -diagnosed in July 2023--continue Xarelto, patient tells me she has remained compliant with Xarelto   4) acute on chronic hypoxic respiratory failure--at baseline patient supposed to be on 2 L of oxygen via nasal cannula at home but admits to noncompliance due to the need to keep smoking -Treated with IV Lasix -Repeat chest x-ray on 01/12/2021 shows improvement -Discharged home on 4 L of oxygen via nasal cannula -Discharge home on Lasix as ordered   5)Social/Ethics---Additional history obtained  from patient's niece [name]  -Patient is a full code   6)Tobacco Abuse--- she is not ready to quit smoking, risk of fire and death with concomitant smoking and oxygen use emphasized to patient and patient's niece [name]   7)HTN--- continue Amlodipine,   Dispo: The patient is from: Home              Anticipated d/c is to: Home  Discharge Condition: stable  Follow UP   Follow-up Information     [facility] Follow up.   Why: RN will call to schedule your first home visit.        [name], MD. Call in 1 week(s).   Specialty: Internal Medicine Why: CBC and BMP blood test Contact information: [place] [address] [phone]  Diet and Activity recommendation:  As advised  Discharge Instructions    Discharge Instructions     Call MD for:  difficulty breathing, headache or visual disturbances   Complete by: As directed    Call MD for:  persistant dizziness or light-headedness   Complete by: As directed    Call MD for:  persistant nausea and vomiting   Complete by: As directed    Call MD for:  temperature >100.4   Complete by: As directed    Diet - low sodium heart healthy   Complete by: As directed    Discharge instructions   Complete by: As directed    1)Very low-salt diet advised----less than 2 g of sodium daily advised 2)Weigh yourself daily, call if you gain more than 3 pounds in 1 day or more than 5 pounds in 1 week as your diuretic medications may need to be adjusted 3)Limit your Fluid  intake to no more than 60 ounces (1.8 Liters) per day 4)you need oxygen at home at 4 L via nasal cannula continuously while awake and while asleep--- smoking or having open fires around oxygen can cause fire, significant injury and death 5)Repeat CBC and BMP blood tests within 1 week----Your Lasix/furosemide and potassium dose may have to be adjusted 6) complete abstinence from tobacco advised 7) please note that there has  been several changes to your medications   For home use only DME Nebulizer machine   Complete by: As directed    Patient needs a nebulizer to treat with the following condition: Dyspnea and respiratory abnormalities   Length of Need: Lifetime   Increase activity slowly   Complete by: As directed          Discharge Medications     Allergies as of 01/14/2022       Reactions   Penicillins Rash   Has patient had a PCN reaction causing immediate rash, facial/tongue/throat swelling, SOB or lightheadedness with hypotension: {Yes/No:[id number] Has patient had a PCN reaction causing severe rash involving mucus membranes or skin necrosis: Yes Has patient had a PCN reaction that required hospitalization No Has patient had a PCN reaction occurring within the last 10 years: No If all of the above answers are "NO", then may proceed with Cephalosporin use. **Has tolerated keflex        Medication List     STOP taking these medications    zinc gluconate 50 MG tablet Replaced by: zinc sulfate 220 (50 Zn) MG capsule       TAKE these medications    albuterol 108 (90 Base) MCG/ACT inhaler Commonly known as: VENTOLIN HFA Inhale 2 puffs into the lungs every 6 (six) hours as needed for wheezing or shortness of breath. What changed: Another medication with the same name was added. Make sure you understand Bailey and when to take each.   albuterol (2.5 MG/3ML) 0.083% nebulizer solution Commonly known as: PROVENTIL Take 3 mLs (2.5 mg total) by nebulization every 2 (two) hours as needed for wheezing or shortness of breath. What changed: You were already taking a medication with the same name, and this prescription was added. Make sure you understand Bailey and when to take each.   amLODipine 5 MG tablet Commonly known as: NORVASC Take 1 tablet (5 mg total) by mouth daily. What changed:  medication strength Bailey much to take   atorvastatin 20 MG tablet Commonly known as: LIPITOR Take 1 tablet  (20 mg total) by mouth daily.   blood glucose meter kit  and supplies Dispense based on patient and insurance preference. Use up to four times daily as directed. (FOR ICD-9 250.00, 250.01).   blood glucose meter kit and supplies Kit Dispense based on patient and insurance preference. Use up to four times daily as directed.   Compressor/Nebulizer Misc 1 Units by Does not apply route daily as needed.   doxycycline 100 MG tablet Commonly known as: VIBRA-TABS Take 1 tablet (100 mg total) by mouth 2 (two) times daily for 5 days.   folic acid 1 MG tablet Commonly known as: FOLVITE Take 1 tablet (1 mg total) by mouth daily.   furosemide 20 MG tablet Commonly known as: Lasix Take 1 tablet (20 mg total) by mouth 2 (two) times daily.   ipratropium-albuterol 0.5-2.5 (3) MG/3ML Soln Commonly known as: DUONEB Take 3 mLs by nebulization every 4 (four) hours as needed.   metFORMIN 500 MG tablet Commonly known as: GLUCOPHAGE Take 1 tablet (500 mg total) by mouth 2 (two) times daily.   pantoprazole 40 MG tablet Commonly known as: PROTONIX Take 1 tablet (40 mg total) by mouth daily. What changed: additional instructions   Potassium Chloride ER 20 MEQ Tbcr Take 20 mEq by mouth daily. 1 tab daily by mouth--- take while taking Lasix/furosemide   prednisoLONE acetate 1 % ophthalmic suspension Commonly known as: PRED FORTE 1 drop 2 (two) times daily.   predniSONE 20 MG tablet Commonly known as: DELTASONE Take 2 tablets (40 mg total) by mouth daily with breakfast for 5 days.   rivaroxaban 20 MG Tabs tablet Commonly known as: XARELTO Take 1 tablet (20 mg total) by mouth daily with supper.   Vitamin D3 25 MCG (1000 UT) Caps Take 1 capsule by mouth daily.   zinc sulfate 220 (50 Zn) MG capsule Take 1 capsule (220 mg total) by mouth daily. Start taking on: January 15, 2022 Replaces: zinc gluconate 50 MG tablet               Durable Medical Equipment  (From admission, onward)            Start     Ordered   01/14/22 1039  For home use only DME oxygen  Once       Comments: SATURATION QUALIFICATIONS: (This note is used to comply with regulatory documentation for home oxygen)   Patient Saturations on Room Air at Rest = 85 %   Patient Saturations on Room Air while Ambulating = 78%   Patient Saturations on 2.5 Liters of oxygen while Ambulating = 85%   Patient Saturations on 4 Liters of oxygen while Ambulating = 92%     Patient needs continuous O2 at 4 L/min continuously via nasal cannula with humidifier, with gaseous portability and conserving device  Question Answer Comment  Length of Need Lifetime   Mode or (Route) Nasal cannula   Liters per Minute 4   Frequency Continuous (stationary and portable oxygen unit needed)   Oxygen conserving device Yes   Oxygen delivery system Gas      01/14/22 1038   01/14/22 0000  For home use only DME Nebulizer machine       Question Answer Comment  Patient needs a nebulizer to treat with the following condition Dyspnea and respiratory abnormalities   Length of Need Lifetime      01/14/22 1059            Major procedures and Radiology Reports - PLEASE review detailed and final reports for all details, in brief -  DG Chest 2 View  Result Date: 01/14/2022 CLINICAL DATA:  Dyspnea and respiratory abnormalities EXAM: CHEST - 2 VIEW COMPARISON:  Chest x-ray 01/12/2022. FINDINGS: Similar small bilateral pleural effusions. Similar overlying bibasilar opacities. Similar cardiomediastinal silhouette. No visible pneumothorax. Polyarticular degenerative change. IMPRESSION: Similar small bilateral pleural effusions with overlying bibasilar atelectasis and/or consolidation. Electronically Signed   By: Margaretha Sheffield M.D.   On: 01/14/2022 08:24   DG Chest 2 View  Result Date: 01/12/2022 CLINICAL DATA:  Shortness of breath EXAM: CHEST - 2 VIEW COMPARISON:  01/09/2022 FINDINGS: Cardiomediastinal silhouette is not significantly  changed. Improved bilateral LOWER lung aeration/opacities noted with decreased small effusions. Pulmonary vascular congestion again identified. There is no evidence of pneumothorax. No other significant changes are noted. IMPRESSION: Improved bilateral LOWER lung aeration with decreased bilateral LOWER lung opacities/atelectasis and small effusions. Electronically Signed   By: Margarette Canada M.D.   On: 01/12/2022 08:03   DG Chest 2 View  Result Date: 01/09/2022 CLINICAL DATA:  Shortness of breath EXAM: CHEST - 2 VIEW COMPARISON:  08/15/2021 FINDINGS: Bilateral mild interstitial thickening. Trace bilateral pleural effusions. Bibasilar airspace disease likely reflecting atelectasis. No pneumothorax. Stable cardiomegaly. No acute osseous abnormality. IMPRESSION: 1. Mild pulmonary edema. Electronically Signed   By: Kathreen Devoid M.D.   On: 01/09/2022 10:34    Today   Subjective    Marisa Bailey today has no new complaints - -no dyspnea at rest -No chest pains No dizziness or palpitations -With ambulation patient requires up to 4 L of oxygen -At rest she requires only 2 L of oxygen - patient has been seen and examined prior to discharge   Objective   Blood pressure (!) 104/45, pulse 65, temperature 98.2 F (36.8 C), temperature source Oral, resp. rate (!) 24, height _0  (1.626 m), weight 60.1 kg, SpO2 91 %.   Intake/Output Summary (Last 24 hours) at 01/14/2022 1100 Last data filed at 01/14/2022 0753 Gross per 24 hour  Intake 240 ml  Output 1800 ml  Net -1560 ml    Exam Gen:- Awake Alert, no acute distress , no conversational dyspnea HEENT:- [place].AT, No sclera icterus -Eyes -enucleated right eye  Nose--[place] 2L/min Neck-Supple Neck,No JVD,.  Lungs-  -much improved air movement, no wheezing CV- S1, S2 normal, regular Abd-  +ve B.Sounds, Abd Soft, No tenderness,    Extremity/Skin:-Resolved edema,   good pulses Psych-affect is appropriate, oriented x3 Neuro-no new focal deficits, no tremors     Data Review   CBC w Diff:  Lab Results  Component Value Date   WBC 6.8 01/14/2022   HGB 9.2 (L) 01/14/2022   HGB 8.8 (L) 11/06/2021   HCT 31.9 (L) 01/14/2022   HCT 29.7 (L) 11/06/2021   PLT 223 01/14/2022   PLT 413 11/06/2021   LYMPHOPCT 15 03/13/2021   MONOPCT 8.5 11/28/2021   EOSPCT 5.9 11/28/2021   BASOPCT 0.5 11/28/2021    CMP:  Lab Results  Component Value Date   NA 140 01/14/2022   NA 141 11/06/2021   K 3.4 (L) 01/14/2022   CL 96 (L) 01/14/2022   CO2 35 (H) 01/14/2022   BUN 20 01/14/2022   BUN 9 11/06/2021   CREATININE 1.02 (H) 01/14/2022   CREATININE 0.86 09/07/2020   PROT 7.5 01/09/2022   PROT 7.0 09/11/2021   ALBUMIN 3.0 (L) 01/14/2022   ALBUMIN 4.2 09/11/2021   BILITOT 0.5 01/09/2022   BILITOT 0.7 09/11/2021   ALKPHOS 71 01/09/2022   AST 22 01/09/2022   ALT  11 01/09/2022  .  Total Discharge time is about 33 minutes  Roxan Hockey M.D on 01/14/2022 at 11:00 AM  Go to [email or web address] -  for contact info  [facility] - Office  [phone]

## 2022-01-14 NOTE — Care Management Important Message (Signed)
Important Message  Patient Details  Name: Marisa Bailey MRN: [id number] Date of Birth: 08-Jan-1941   Medicare Important Message Given:  Yes     [name] 01/14/2022, 11:25 AM

## 2022-01-15 ENCOUNTER — Telehealth: Payer: Self-pay

## 2022-01-15 NOTE — Patient Outreach (Signed)
error 

## 2022-01-15 NOTE — Patient Outreach (Signed)
  Care Coordination TOC Note Transition Care Management Unsuccessful Follow-up Telephone Call  Date of discharge and from where:  [facility] 01/09/22-01/14/22  Attempts:  1st Attempt  Reason for unsuccessful TCM follow-up call:  No answer/busy  Johnney Killian, RN, BSN, CCM Care Management Coordinator [facility] Phone: [phone]: [phone]

## 2022-01-16 ENCOUNTER — Telehealth: Payer: Self-pay

## 2022-01-16 DIAGNOSIS — F17210 Nicotine dependence, cigarettes, uncomplicated: Secondary | ICD-10-CM | POA: Diagnosis not present

## 2022-01-16 DIAGNOSIS — Z7984 Long term (current) use of oral hypoglycemic drugs: Secondary | ICD-10-CM | POA: Diagnosis not present

## 2022-01-16 DIAGNOSIS — I110 Hypertensive heart disease with heart failure: Secondary | ICD-10-CM | POA: Diagnosis not present

## 2022-01-16 DIAGNOSIS — E119 Type 2 diabetes mellitus without complications: Secondary | ICD-10-CM | POA: Diagnosis not present

## 2022-01-16 DIAGNOSIS — D649 Anemia, unspecified: Secondary | ICD-10-CM | POA: Diagnosis not present

## 2022-01-16 DIAGNOSIS — M6281 Muscle weakness (generalized): Secondary | ICD-10-CM | POA: Diagnosis not present

## 2022-01-16 DIAGNOSIS — J449 Chronic obstructive pulmonary disease, unspecified: Secondary | ICD-10-CM | POA: Diagnosis not present

## 2022-01-16 DIAGNOSIS — Z86711 Personal history of pulmonary embolism: Secondary | ICD-10-CM | POA: Diagnosis not present

## 2022-01-16 DIAGNOSIS — I5033 Acute on chronic diastolic (congestive) heart failure: Secondary | ICD-10-CM | POA: Diagnosis not present

## 2022-01-16 DIAGNOSIS — J9621 Acute and chronic respiratory failure with hypoxia: Secondary | ICD-10-CM | POA: Diagnosis not present

## 2022-01-16 NOTE — Patient Outreach (Signed)
  Care Coordination TOC Note Transition Care Management Unsuccessful Follow-up Telephone Call  Date of discharge and from where:  [facility] 01/14/22  Attempts:  2nd Attempt  Reason for unsuccessful TCM follow-up call:  No answer/busy  Johnney Killian, RN, BSN, CCM Care Management Coordinator [facility] Phone: [phone]: [phone]

## 2022-01-17 ENCOUNTER — Telehealth: Payer: Self-pay

## 2022-01-17 DIAGNOSIS — J449 Chronic obstructive pulmonary disease, unspecified: Secondary | ICD-10-CM | POA: Diagnosis not present

## 2022-01-17 DIAGNOSIS — I110 Hypertensive heart disease with heart failure: Secondary | ICD-10-CM | POA: Diagnosis not present

## 2022-01-17 DIAGNOSIS — Z86711 Personal history of pulmonary embolism: Secondary | ICD-10-CM | POA: Diagnosis not present

## 2022-01-17 DIAGNOSIS — J9621 Acute and chronic respiratory failure with hypoxia: Secondary | ICD-10-CM | POA: Diagnosis not present

## 2022-01-17 DIAGNOSIS — M6281 Muscle weakness (generalized): Secondary | ICD-10-CM | POA: Diagnosis not present

## 2022-01-17 DIAGNOSIS — E119 Type 2 diabetes mellitus without complications: Secondary | ICD-10-CM | POA: Diagnosis not present

## 2022-01-17 DIAGNOSIS — F17210 Nicotine dependence, cigarettes, uncomplicated: Secondary | ICD-10-CM | POA: Diagnosis not present

## 2022-01-17 DIAGNOSIS — D649 Anemia, unspecified: Secondary | ICD-10-CM | POA: Diagnosis not present

## 2022-01-17 DIAGNOSIS — I5033 Acute on chronic diastolic (congestive) heart failure: Secondary | ICD-10-CM | POA: Diagnosis not present

## 2022-01-17 DIAGNOSIS — Z7984 Long term (current) use of oral hypoglycemic drugs: Secondary | ICD-10-CM | POA: Diagnosis not present

## 2022-01-17 NOTE — Patient Outreach (Signed)
  Care Coordination TOC Note Transition Care Management Unsuccessful Follow-up Telephone Call  Date of discharge and from where:  [facility] 01/09/22-01/14/22  Attempts:  3rd Attempt  Reason for unsuccessful TCM follow-up call:  No answer/busy  Johnney Killian, RN, BSN, CCM Care Management Coordinator [facility] Phone: [phone]: [phone]

## 2022-01-18 DIAGNOSIS — D649 Anemia, unspecified: Secondary | ICD-10-CM | POA: Diagnosis not present

## 2022-01-18 DIAGNOSIS — Z7984 Long term (current) use of oral hypoglycemic drugs: Secondary | ICD-10-CM | POA: Diagnosis not present

## 2022-01-18 DIAGNOSIS — F17210 Nicotine dependence, cigarettes, uncomplicated: Secondary | ICD-10-CM | POA: Diagnosis not present

## 2022-01-18 DIAGNOSIS — M6281 Muscle weakness (generalized): Secondary | ICD-10-CM | POA: Diagnosis not present

## 2022-01-18 DIAGNOSIS — I5033 Acute on chronic diastolic (congestive) heart failure: Secondary | ICD-10-CM | POA: Diagnosis not present

## 2022-01-18 DIAGNOSIS — J449 Chronic obstructive pulmonary disease, unspecified: Secondary | ICD-10-CM | POA: Diagnosis not present

## 2022-01-18 DIAGNOSIS — E119 Type 2 diabetes mellitus without complications: Secondary | ICD-10-CM | POA: Diagnosis not present

## 2022-01-18 DIAGNOSIS — I110 Hypertensive heart disease with heart failure: Secondary | ICD-10-CM | POA: Diagnosis not present

## 2022-01-18 DIAGNOSIS — J9621 Acute and chronic respiratory failure with hypoxia: Secondary | ICD-10-CM | POA: Diagnosis not present

## 2022-01-18 DIAGNOSIS — Z86711 Personal history of pulmonary embolism: Secondary | ICD-10-CM | POA: Diagnosis not present

## 2022-01-21 DIAGNOSIS — J449 Chronic obstructive pulmonary disease, unspecified: Secondary | ICD-10-CM | POA: Diagnosis not present

## 2022-01-21 DIAGNOSIS — Z86711 Personal history of pulmonary embolism: Secondary | ICD-10-CM | POA: Diagnosis not present

## 2022-01-21 DIAGNOSIS — I110 Hypertensive heart disease with heart failure: Secondary | ICD-10-CM | POA: Diagnosis not present

## 2022-01-21 DIAGNOSIS — D649 Anemia, unspecified: Secondary | ICD-10-CM | POA: Diagnosis not present

## 2022-01-21 DIAGNOSIS — Z7984 Long term (current) use of oral hypoglycemic drugs: Secondary | ICD-10-CM | POA: Diagnosis not present

## 2022-01-21 DIAGNOSIS — J9621 Acute and chronic respiratory failure with hypoxia: Secondary | ICD-10-CM | POA: Diagnosis not present

## 2022-01-21 DIAGNOSIS — E119 Type 2 diabetes mellitus without complications: Secondary | ICD-10-CM | POA: Diagnosis not present

## 2022-01-21 DIAGNOSIS — F17210 Nicotine dependence, cigarettes, uncomplicated: Secondary | ICD-10-CM | POA: Diagnosis not present

## 2022-01-21 DIAGNOSIS — M6281 Muscle weakness (generalized): Secondary | ICD-10-CM | POA: Diagnosis not present

## 2022-01-21 DIAGNOSIS — I5033 Acute on chronic diastolic (congestive) heart failure: Secondary | ICD-10-CM | POA: Diagnosis not present

## 2022-01-22 ENCOUNTER — Encounter (HOSPITAL_COMMUNITY): Payer: Self-pay

## 2022-01-22 ENCOUNTER — Emergency Department (HOSPITAL_COMMUNITY): Payer: Medicare Other

## 2022-01-22 ENCOUNTER — Other Ambulatory Visit: Payer: Self-pay

## 2022-01-22 ENCOUNTER — Emergency Department (HOSPITAL_COMMUNITY)
Admission: EM | Admit: 2022-01-22 | Discharge: 2022-01-22 | Disposition: A | Discharge status: Home / Self Care | Payer: Medicare Other | Attending: Emergency Medicine | Admitting: Emergency Medicine

## 2022-01-22 ENCOUNTER — Telehealth: Payer: Self-pay

## 2022-01-22 DIAGNOSIS — R52 Pain, unspecified: Secondary | ICD-10-CM | POA: Diagnosis not present

## 2022-01-22 DIAGNOSIS — I499 Cardiac arrhythmia, unspecified: Secondary | ICD-10-CM | POA: Diagnosis not present

## 2022-01-22 DIAGNOSIS — W19XXXA Unspecified fall, initial encounter: Secondary | ICD-10-CM

## 2022-01-22 DIAGNOSIS — Z7901 Long term (current) use of anticoagulants: Secondary | ICD-10-CM | POA: Diagnosis not present

## 2022-01-22 DIAGNOSIS — S0083XA Contusion of other part of head, initial encounter: Secondary | ICD-10-CM | POA: Diagnosis not present

## 2022-01-22 DIAGNOSIS — R55 Syncope and collapse: Secondary | ICD-10-CM | POA: Diagnosis not present

## 2022-01-22 DIAGNOSIS — R519 Headache, unspecified: Secondary | ICD-10-CM | POA: Diagnosis not present

## 2022-01-22 DIAGNOSIS — W0110XA Fall on same level from slipping, tripping and stumbling with subsequent striking against unspecified object, initial encounter: Secondary | ICD-10-CM | POA: Insufficient documentation

## 2022-01-22 DIAGNOSIS — Z79899 Other long term (current) drug therapy: Secondary | ICD-10-CM | POA: Diagnosis not present

## 2022-01-22 DIAGNOSIS — J90 Pleural effusion, not elsewhere classified: Secondary | ICD-10-CM | POA: Diagnosis not present

## 2022-01-22 DIAGNOSIS — M79645 Pain in left finger(s): Secondary | ICD-10-CM | POA: Diagnosis not present

## 2022-01-22 DIAGNOSIS — S0003XA Contusion of scalp, initial encounter: Secondary | ICD-10-CM | POA: Diagnosis not present

## 2022-01-22 DIAGNOSIS — S0990XA Unspecified injury of head, initial encounter: Secondary | ICD-10-CM | POA: Diagnosis not present

## 2022-01-22 DIAGNOSIS — Z743 Need for continuous supervision: Secondary | ICD-10-CM | POA: Diagnosis not present

## 2022-01-22 LAB — CBC WITH DIFFERENTIAL/PLATELET
Abs Immature Granulocytes: 0.11 10*3/uL — ABNORMAL HIGH (ref 0.00–0.07)
Basophils Absolute: 0 10*3/uL (ref 0.0–0.1)
Basophils Relative: 0 %
Eosinophils Absolute: 0 10*3/uL (ref 0.0–0.5)
Eosinophils Relative: 0 %
HCT: 32 % — ABNORMAL LOW (ref 36.0–46.0)
Hemoglobin: 9.1 g/dL — ABNORMAL LOW (ref 12.0–15.0)
Immature Granulocytes: 1 %
Lymphocytes Relative: 7 %
Lymphs Abs: 0.5 10*3/uL — ABNORMAL LOW (ref 0.7–4.0)
MCH: 21.5 pg — ABNORMAL LOW (ref 26.0–34.0)
MCHC: 28.4 g/dL — ABNORMAL LOW (ref 30.0–36.0)
MCV: 75.7 fL — ABNORMAL LOW (ref 80.0–100.0)
Monocytes Absolute: 0.5 10*3/uL (ref 0.1–1.0)
Monocytes Relative: 7 %
Neutro Abs: 7.2 10*3/uL (ref 1.7–7.7)
Neutrophils Relative %: 85 %
Platelets: 345 10*3/uL (ref 150–400)
RBC: 4.23 MIL/uL (ref 3.87–5.11)
RDW: 21.2 % — ABNORMAL HIGH (ref 11.5–15.5)
WBC: 8.4 10*3/uL (ref 4.0–10.5)
nRBC: 0.2 % (ref 0.0–0.2)

## 2022-01-22 LAB — COMPREHENSIVE METABOLIC PANEL
ALT: 27 U/L (ref 0–44)
AST: 26 U/L (ref 15–41)
Albumin: 3.3 g/dL — ABNORMAL LOW (ref 3.5–5.0)
Alkaline Phosphatase: 78 U/L (ref 38–126)
Anion gap: 11 (ref 5–15)
BUN: 24 mg/dL — ABNORMAL HIGH (ref 8–23)
CO2: 32 mmol/L (ref 22–32)
Calcium: 9.4 mg/dL (ref 8.9–10.3)
Chloride: 99 mmol/L (ref 98–111)
Creatinine, Ser: 0.99 mg/dL (ref 0.44–1.00)
GFR, Estimated: 57 mL/min — ABNORMAL LOW (ref >60)
Glucose, Bld: 109 mg/dL — ABNORMAL HIGH (ref 70–99)
Potassium: 5 mmol/L (ref 3.5–5.1)
Sodium: 142 mmol/L (ref 135–145)
Total Bilirubin: 0.7 mg/dL (ref 0.3–1.2)
Total Protein: 7.8 g/dL (ref 6.5–8.1)

## 2022-01-22 LAB — ETHANOL: Alcohol, Ethyl (B): <10 mg/dL (ref <10)

## 2022-01-22 NOTE — ED Triage Notes (Signed)
Pt BIBA from home. Pt was voiding on the toilet today when she had a syncopal episode. Pt hit her head on the tub. Pt takes Xarelto- last dose yesterday. Pt has hematoma on left side of head. Pt A&Ox3- wrong year, right month. Pt wears 4L [place] at all times.

## 2022-01-22 NOTE — ED Notes (Signed)
Pt ambulated without issue to restroom.

## 2022-01-22 NOTE — ED Provider Notes (Signed)
[facility] EMERGENCY DEPARTMENT Provider Note   CSN: [id number] Arrival date & time: 01/22/22  0809     History  Chief Complaint  Patient presents with   Loss of Consciousness    Marisa Bailey is a 81 y.o. female.  HPI Patient presents after a fall that occurred after episode of possible syncope.  She was on the commode, when she fell, striking her head.  She is on Xarelto, but notes that she did not take her medication today yet. She complains of pain in the back of her head, denies weakness anywhere, was well prior to the event.    Home Medications Prior to Admission medications   Medication Sig Start Date End Date Taking? Authorizing Provider  rivaroxaban (XARELTO) 20 MG TABS tablet Take 1 tablet (20 mg total) by mouth daily with supper. 01/14/22  Yes Emokpae, Courage, MD  albuterol (PROVENTIL) (2.5 MG/3ML) 0.083% nebulizer solution Take 3 mLs (2.5 mg total) by nebulization every 2 (two) hours as needed for wheezing or shortness of breath. 01/14/22   Roxan Hockey, MD  albuterol (VENTOLIN HFA) 108 (90 Base) MCG/ACT inhaler Inhale 2 puffs into the lungs every 6 (six) hours as needed for wheezing or shortness of breath. 01/14/22   Roxan Hockey, MD  amLODipine (NORVASC) 5 MG tablet Take 1 tablet (5 mg total) by mouth daily. 01/14/22   Roxan Hockey, MD  atorvastatin (LIPITOR) 20 MG tablet Take 1 tablet (20 mg total) by mouth daily. 01/14/22   Roxan Hockey, MD  blood glucose meter kit and supplies KIT Dispense based on patient and insurance preference. Use up to four times daily as directed. 01/14/22   Roxan Hockey, MD  blood glucose meter kit and supplies Dispense based on patient and insurance preference. Use up to four times daily as directed. (FOR ICD-9 250.00, 250.01). 01/14/22   Roxan Hockey, MD  Cholecalciferol (VITAMIN D3) 25 MCG (1000 UT) CAPS Take 1 capsule by mouth daily.    [provider]  folic acid (FOLVITE) 1 MG tablet Take 1 tablet (1 mg  total) by mouth daily. 01/14/22   Roxan Hockey, MD  furosemide (LASIX) 20 MG tablet Take 1 tablet (20 mg total) by mouth 2 (two) times daily. 01/14/22 01/14/23  Roxan Hockey, MD  ipratropium-albuterol (DUONEB) 0.5-2.5 (3) MG/3ML SOLN Take 3 mLs by nebulization every 4 (four) hours as needed. 01/14/22   Roxan Hockey, MD  metFORMIN (GLUCOPHAGE) 500 MG tablet Take 1 tablet (500 mg total) by mouth 2 (two) times daily. 01/14/22   Roxan Hockey, MD  Nebulizers (COMPRESSOR/NEBULIZER) MISC 1 Units by Does not apply route daily as needed. 01/14/22   Roxan Hockey, MD  pantoprazole (PROTONIX) 40 MG tablet Take 1 tablet (40 mg total) by mouth daily. 01/14/22   Roxan Hockey, MD  Potassium Chloride ER 20 MEQ TBCR Take 20 mEq by mouth daily. 1 tab daily by mouth--- take while taking Lasix/furosemide 01/14/22   Roxan Hockey, MD  prednisoLONE acetate (PRED FORTE) 1 % ophthalmic suspension 1 drop 2 (two) times daily. 08/06/21   [provider]  zinc sulfate 220 (50 Zn) MG capsule Take 1 capsule (220 mg total) by mouth daily. 01/15/22   Roxan Hockey, MD      Allergies    Penicillins    Review of Systems   Review of Systems  Constitutional:        Per HPI, otherwise negative  HENT:         Per HPI, otherwise negative  Respiratory:  Per HPI, otherwise negative  Cardiovascular:        Per HPI, otherwise negative  Gastrointestinal:  Negative for vomiting.  Endocrine:       Negative aside from HPI  Genitourinary:        Neg aside from HPI   Musculoskeletal:        Per HPI, otherwise negative  Skin: Negative.   Neurological:  Positive for syncope.    Physical Exam Updated Vital Signs BP (!) 161/69   Pulse 61   Temp 98.4 F (36.9 C) (Oral)   Resp 20   Ht _0  (1.626 m)   Wt 59 kg   SpO2 100%   BMI 22.31 kg/m  Physical Exam Vitals and nursing note reviewed.  Constitutional:      General: She is not in acute distress.    Appearance: She is well-developed.   HENT:     Head: Normocephalic.     Comments: hematoma Eyes:     Conjunctiva/sclera: Conjunctivae normal.  Cardiovascular:     Rate and Rhythm: Normal rate and regular rhythm.  Pulmonary:     Effort: Pulmonary effort is normal. No respiratory distress.     Breath sounds: Normal breath sounds. No stridor.  Abdominal:     General: There is no distension.  Musculoskeletal:     Cervical back: Normal range of motion and neck supple. No rigidity or tenderness.  Skin:    General: Skin is warm and dry.  Neurological:     Mental Status: She is alert and oriented to person, place, and time.     Cranial Nerves: No cranial nerve deficit.     Motor: Atrophy present. No weakness or abnormal muscle tone.  Psychiatric:        Mood and Affect: Mood normal.     ED Results / Procedures / Treatments   Labs (all labs ordered are listed, but only abnormal results are displayed) Labs Reviewed  COMPREHENSIVE METABOLIC PANEL - Abnormal; Notable for the following components:      Result Value   Glucose, Bld 109 (*)    BUN 24 (*)    Albumin 3.3 (*)    GFR, Estimated 57 (*)    All other components within normal limits  CBC WITH DIFFERENTIAL/PLATELET - Abnormal; Notable for the following components:   Hemoglobin 9.1 (*)    HCT 32.0 (*)    MCV 75.7 (*)    MCH 21.5 (*)    MCHC 28.4 (*)    RDW 21.2 (*)    Lymphs Abs 0.5 (*)    Abs Immature Granulocytes 0.11 (*)    All other components within normal limits  ETHANOL    EKG EKG Interpretation  Date/Time:  Tuesday January 22 2022 08:27:33 EST Ventricular Rate:  57 PR Interval:  204 QRS Duration: 84 QT Interval:  401 QTC Calculation: 391 R Axis:   162 Text Interpretation: Sinus rhythm Atrial premature complex Low voltage with right axis deviation Probable right ventricular hypertrophy Abnormal ECG Confirmed by Carmin Muskrat [phone]) on 01/22/2022 8:35:38 AM  Radiology DG Chest 2 View  Result Date: 01/22/2022 CLINICAL DATA:  Fall.   Syncope. EXAM: CHEST - 2 VIEW COMPARISON:  Chest radiographs 01/14/2022, 01/09/2022, 08/15/2021; CT chest 08/15/2021 FINDINGS: Cardiac silhouette is again moderately to markedly enlarged. Mediastinal contours are within normal limits. Mild-to-moderate atherosclerotic calcifications within the aortic arch. Tiny bilateral pleural effusions as on multiple prior radiographs, decreased from 01/09/2022 and not significantly changed from 01/14/2022. Chronic right greater than left  basilar opacities, unchanged on the right and slightly improved on the left compared to 01/14/2022 most recent prior. Note is made of mild bilateral lower lung bronchiectasis and likely interstitial scarring on 08/15/2021 prior CT. No pneumothorax is seen. Mild dextrocurvature of the lower thoracic spine with mild-to-moderate multilevel degenerative disc changes. IMPRESSION: 1. Tiny bilateral pleural effusions, decreased from 01/09/2022 and not significantly changed from 01/14/2022. 2. Chronic right greater than left basilar opacities, unchanged on the right and slightly improved on the left compared to 01/14/2022. Electronically Signed   By: Yvonne Kendall M.D.   On: 01/22/2022 09:31   DG Finger Thumb Left  Result Date: 01/22/2022 CLINICAL DATA:  Fall.  Syncope.  Left thumb pain. EXAM: LEFT THUMB 2+V COMPARISON:  None Available. FINDINGS: There is diffuse decreased bone mineralization. Mild thumb carpometacarpal greater than interphalangeal joint space narrowing and peripheral osteophytosis. No acute fracture is seen. No dislocation. IMPRESSION: 1. No acute fracture. 2. 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 Electronically Signed   By: Yvonne Kendall M.D.   On: 01/22/2022 09:26   CT Head Wo Contrast  Result Date: 01/22/2022 CLINICAL DATA:  Polytrauma, blunt fall, head trauma, on Xarelto EXAM: CT HEAD WITHOUT CONTRAST TECHNIQUE: Contiguous axial images were obtained from the base of the skull through the  vertex without intravenous contrast. RADIATION DOSE REDUCTION: This exam was performed according to the departmental dose-optimization program which includes automated exposure control, adjustment of the mA and/or kV according to patient size and/or use of iterative reconstruction technique. COMPARISON:  08/21/2021 FINDINGS: Brain: No evidence of acute infarction, hemorrhage, hydrocephalus, extra-axial collection or mass lesion/mass effect. Vascular: Atherosclerotic calcifications involving the large vessels of the skull base. No unexpected hyperdense vessel. Skull: Normal. Negative for fracture or focal lesion. Sinuses/Orbits: Mucosal thickening within the imaged right maxillary sinus. Chronic sclerosis of the right mastoid air cells. Other: Small high right frontal scalp hematoma. IMPRESSION: 1. No acute intracranial abnormality. 2. Small high right frontal scalp hematoma. No underlying calvarial fracture. Electronically Signed   By: Davina Poke D.O.   On: 01/22/2022 09:12    Procedures Procedures    Medications Ordered in ED Medications - No data to display  ED Course/ Medical Decision Making/ A&P This patient with a Hx of COPD, hypertension, heart failure, prosthetic eye presents to the ED for concern of syncope, fall, this involves an extensive number of treatment options, and is a complaint that carries with it a high risk of complications and morbidity.    The differential diagnosis includes arrhythmogenic, versus vasogenic versus sympathetic disorder plus posttraumatic effects including intracranial hemorrhage, fracture.   Social Determinants of Health:  Advanced age  Additional history obtained:  Additional history and/or information obtained from chart review, EMS, notable for details included in HPI   After the initial evaluation, orders, including: Labs CT x-ray monitoring were initiated.   Patient placed on Cardiac and Pulse-Oximetry Monitors. The patient was maintained  on a cardiac monitor.  The cardiac monitored showed an rhythm of 60 sinus normal The patient was also maintained on pulse oximetry. The readings were typically 100% room air normal   On repeat evaluation of the patient improved 10:34 AM Patient awake, alert, smiling.  Receptive to all results, no ongoing complaints, she is amenable to follow-up plan Lab Tests:  I personally interpreted labs.  The pertinent results include: Unremarkable labs, aside from mild hyperglycemia, mild anemia  Imaging Studies ordered:  I independently visualized and interpreted imaging which showed no intracranial abnormalities, no  fracture I agree with the radiologist interpretation   Dispostion / Final MDM:  After consideration of the diagnostic results and the patient's response to treatment, this adult female presenting after fall, possible syncope with mild head trauma, but no focal neurodeficits on exam is awake, alert, appropriately interactive, hemodynamically unremarkable, has reassuring labs, ECG, x-ray, head CT.  Will evidence for intracranial pathology, and after hours of monitoring patient has had no decompensation.  Hospitalization consideration given her age, anticoagulant use, but given these reassuring findings, patient is comfortable with, appropriate for close outpatient follow-up.  Final Clinical Impression(s) / ED Diagnoses Final diagnoses:  Fall, initial encounter     Carmin Muskrat, MD 01/22/22 1036

## 2022-01-22 NOTE — Discharge Instructions (Addendum)
As discussed, your evaluation today has been largely reassuring.  But, it is important that you monitor your condition carefully, and do not hesitate to return to the ED if you develop new, or concerning changes in your condition. ? ?Otherwise, please follow-up with your physician for appropriate ongoing care. ? ?

## 2022-01-22 NOTE — Patient Outreach (Signed)
  Care Coordination   Collaboration  Visit Note   01/22/2022 Name: LAYLONI FAHRNER MRN: [id number] DOB: 11/23/1940  LANNAH KOIKE is a 81 y.o. year old female who sees [name], MD for primary care. I  collaborated with [employer] from [place] and notified her of patients fall and ED visit today.  Patient has follow up with PCP on 01/24/22.  What matters to the patients health and wellness today?  Fall and ED visit    Goals Addressed             This Visit's Progress    Absence of Fall and Fall-Related Injury       Evidence-based guidance: Collaboration with [place] Assess fall risk using a validated tool when available. Consider balance and gait impairment, muscle weakness, diminished vision or hearing, environmental hazards, presence of urinary or bowel urgency and/or incontinence. Reported fall/ED visit to [facility] team.  Patient has follow up with PCP on 01/24/22. Communicate fall injury risk to interprofessional healthcare team.  Notes:      COMPLETED: Patient Stated       Would like to travel more         SDOH assessments and interventions completed:  Yes     Care Coordination Interventions:  Yes, provided   Follow up plan:  Plan to notify [facility] [place] to follow up with patient.    Encounter Outcome:  Pt. Visit Completed

## 2022-01-23 ENCOUNTER — Ambulatory Visit: Payer: Self-pay | Admitting: *Deleted

## 2022-01-23 NOTE — Patient Outreach (Signed)
  Care Coordination   01/23/2022 Name: Marisa Bailey MRN: [id number] DOB: 27-Jan-1941   Care Coordination Outreach Attempts:  An unsuccessful telephone outreach was attempted for a scheduled appointment today.  Follow Up Plan:  Additional outreach attempts will be made to offer the patient care coordination information and services.   Encounter Outcome:  No Answer   Care Coordination Interventions:  No, not indicated    Chong Sicilian, BSN, RN-BC RN Care Coordinator [place]: [phone] Main #: [phone]

## 2022-01-24 ENCOUNTER — Encounter: Payer: Self-pay | Admitting: Internal Medicine

## 2022-01-24 ENCOUNTER — Ambulatory Visit (INDEPENDENT_AMBULATORY_CARE_PROVIDER_SITE_OTHER): Payer: Medicare Other | Admitting: Internal Medicine

## 2022-01-24 VITALS — BP 131/77 | HR 91 | Ht 64.0 in | Wt 130.0 lb

## 2022-01-24 DIAGNOSIS — M6281 Muscle weakness (generalized): Secondary | ICD-10-CM | POA: Diagnosis not present

## 2022-01-24 DIAGNOSIS — J449 Chronic obstructive pulmonary disease, unspecified: Secondary | ICD-10-CM | POA: Diagnosis not present

## 2022-01-24 DIAGNOSIS — I10 Essential (primary) hypertension: Secondary | ICD-10-CM | POA: Diagnosis not present

## 2022-01-24 DIAGNOSIS — D649 Anemia, unspecified: Secondary | ICD-10-CM

## 2022-01-24 DIAGNOSIS — I2694 Multiple subsegmental pulmonary emboli without acute cor pulmonale: Secondary | ICD-10-CM | POA: Diagnosis not present

## 2022-01-24 DIAGNOSIS — J9621 Acute and chronic respiratory failure with hypoxia: Secondary | ICD-10-CM | POA: Diagnosis not present

## 2022-01-24 DIAGNOSIS — I5033 Acute on chronic diastolic (congestive) heart failure: Secondary | ICD-10-CM | POA: Diagnosis not present

## 2022-01-24 DIAGNOSIS — W19XXXD Unspecified fall, subsequent encounter: Secondary | ICD-10-CM | POA: Diagnosis not present

## 2022-01-24 DIAGNOSIS — Z7984 Long term (current) use of oral hypoglycemic drugs: Secondary | ICD-10-CM | POA: Diagnosis not present

## 2022-01-24 DIAGNOSIS — W19XXXA Unspecified fall, initial encounter: Secondary | ICD-10-CM | POA: Insufficient documentation

## 2022-01-24 DIAGNOSIS — I110 Hypertensive heart disease with heart failure: Secondary | ICD-10-CM | POA: Diagnosis not present

## 2022-01-24 DIAGNOSIS — E119 Type 2 diabetes mellitus without complications: Secondary | ICD-10-CM | POA: Diagnosis not present

## 2022-01-24 DIAGNOSIS — F17210 Nicotine dependence, cigarettes, uncomplicated: Secondary | ICD-10-CM | POA: Diagnosis not present

## 2022-01-24 DIAGNOSIS — Z86711 Personal history of pulmonary embolism: Secondary | ICD-10-CM | POA: Diagnosis not present

## 2022-01-24 NOTE — Patient Instructions (Signed)
Please continue taking medications as prescribed.  Please continue to follow low salt diet and ambulate as tolerated.

## 2022-01-24 NOTE — Progress Notes (Signed)
Established Patient Office Visit  Subjective:  Patient ID: Marisa Bailey, female    DOB: [birth year]  Age: 81 y.o. MRN: [id number]  CC:  Chief Complaint  Patient presents with   Follow-up    For COPD. Patient fell Monday, hit her head , still sore    HPI Marisa Bailey is a 81 y.o. female with past medical history of HTN, HFpEF, COPD with chronic hypoxic respiratory failure and tobacco abuse who presents for f/u of her chronic medical conditions.  HTN: She takes Amlodipine.  She denies any chest pain or palpitations currently.  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  She denies any LE swelling currently.   COPD with chronic hypoxic respiratory failure: She has seen Dr. Halford Chessman for COPD.  She currently uses albuterol nebulizer as needed for dyspnea or wheezing.  She still smokes about a pack per day and does not want to quit anytime soon.  She has home O2 for chronic hypoxic respiratory failure, which she uses at nighttime and as needed for dyspnea.  She denies any dyspnea or wheezing currently.  She has history of bronchiectasis and has Afflovest for it, but needs to use it regularly.  PE: She is taking Xarelto, but has been inconsistent due to her history of GI bleed.  Denies any chest pain, dyspnea or palpitations.  She had a fall in her bathroom while trying to get up from commode.  She had hit her head, went to ER and had CT head done, which showed small frontal scalp hematoma without any fracture.  She denies any headache, dizziness or new visual disturbance currently.  She has soreness in the neck and shoulder area, but denies any bruising currently.  Her soreness is slowly improving.  She has brought home medications for review.  I had lengthy discussion about compliance to her medication regimen.  Past Medical History:  Diagnosis Date   Aspiration pneumonia ([place]) 11/03/2020   Blind right eye    CHF (congestive heart failure) (HCC)    COPD (chronic  obstructive pulmonary disease) (HCC)    Diabetes mellitus without complication ([place])    Fx upper humerus-closed 08/17/2012   H/O blood clots    lungs   Hypertension     Past Surgical History:  Procedure Laterality Date   CATARACT EXTRACTION W/PHACO Left 09/24/2021   Procedure: CATARACT EXTRACTION PHACO AND INTRAOCULAR LENS PLACEMENT ([place]);  Surgeon: Baruch Goldmann, MD;  Location: [facility];  Service: Ophthalmology;  Laterality: Left;  CDE: 8.08   ENUCLEATION     ESOPHAGOGASTRODUODENOSCOPY (EGD) WITH PROPOFOL N/A 10/25/2021   Procedure: ESOPHAGOGASTRODUODENOSCOPY (EGD) WITH PROPOFOL;  Surgeon: Harvel Quale, MD;  Location: [facility];  Service: Gastroenterology;  Laterality: N/A;   HEMOSTASIS CLIP PLACEMENT  10/25/2021   Procedure: HEMOSTASIS CLIP PLACEMENT;  Surgeon: Harvel Quale, MD;  Location: [facility];  Service: Gastroenterology;;   HOT HEMOSTASIS  10/25/2021   Procedure: HOT HEMOSTASIS (ARGON PLASMA COAGULATION/BICAP);  Surgeon: Montez Morita, Quillian Quince, MD;  Location: [facility];  Service: Gastroenterology;;   SUBMUCOSAL INJECTION  10/25/2021   Procedure: SUBMUCOSAL INJECTION;  Surgeon: Harvel Quale, MD;  Location: [facility];  Service: Gastroenterology;;    Family History  Problem Relation Age of Onset   Diabetes Mother     Social History   Socioeconomic History   Marital status: Single    Spouse name: Not on file   Number of children: Not on file   Years  of education: Not on file   Highest education level: Not on file  Occupational History   Not on file  Tobacco Use   Smoking status: Every Day    Packs/day: 1.00    Years: 18.00    Total pack years: 18.00    Types: Cigarettes   Smokeless tobacco: Never  Vaping Use   Vaping Use: Never used  Substance and Sexual Activity   Alcohol use: Not Currently    Comment: rarely   Drug use: No   Sexual activity: Not on file  Other Topics Concern   Not on file  Social History  Narrative   Retired (from [employer]). No children, never been married. Has 8 siblings (5 are deceased).   Social Determinants of Health   Financial Resource Strain: Low Risk  (11/30/2020)   Overall Financial Resource Strain (CARDIA)    Difficulty of Paying Living Expenses: Not hard at all  Food Insecurity: No Food Insecurity (01/09/2022)   Hunger Vital Sign    Worried About Running Out of Food in the Last Year: Never true    Ran Out of Food in the Last Year: Never true  Transportation Needs: No Transportation Needs (01/09/2022)   PRAPARE - Hydrologist (Medical): No    Lack of Transportation (Non-Medical): No  Physical Activity: Sufficiently Active (11/30/2020)   Exercise Vital Sign    Days of Exercise per Week: 7 days    Minutes of Exercise per Session: 30 min  Stress: No Stress Concern Present (11/30/2020)   [place]    Feeling of Stress : Not at all  Social Connections: Moderately Isolated (11/30/2020)   Social Connection and Isolation Panel [NHANES]    Frequency of Communication with Friends and Family: More than three times a week    Frequency of Social Gatherings with Friends and Family: More than three times a week    Attends Religious Services: 1 to 4 times per year    Active Member of [employer] or Organizations: No    Attends Archivist Meetings: Never    Marital Status: Never married  Intimate Partner Violence: Not At Risk (01/09/2022)   Humiliation, Afraid, Rape, and Kick questionnaire    Fear of Current or Ex-Partner: No    Emotionally Abused: No    Physically Abused: No    Sexually Abused: No    Outpatient Medications Prior to Visit  Medication Sig Dispense Refill   ACCU-CHEK GUIDE test strip SMARTSIG:Via Meter 1-4 Times Daily     Accu-Chek Softclix Lancets lancets SMARTSIG:Via Meter 1-4 Times Daily     KLOR-CON M20 20 MEQ tablet Take 20 mEq by mouth daily.      albuterol (PROVENTIL) (2.5 MG/3ML) 0.083% nebulizer solution Take 3 mLs (2.5 mg total) by nebulization every 2 (two) hours as needed for wheezing or shortness of breath. 75 mL 12   albuterol (VENTOLIN HFA) 108 (90 Base) MCG/ACT inhaler Inhale 2 puffs into the lungs every 6 (six) hours as needed for wheezing or shortness of breath. 18 g 2   amLODipine (NORVASC) 5 MG tablet Take 1 tablet (5 mg total) by mouth daily. 90 tablet 3   atorvastatin (LIPITOR) 20 MG tablet Take 1 tablet (20 mg total) by mouth daily. 90 tablet 3   blood glucose meter kit and supplies KIT Dispense based on patient and insurance preference. Use up to four times daily as directed. 1 each 0   blood glucose  meter kit and supplies Dispense based on patient and insurance preference. Use up to four times daily as directed. (FOR ICD-9 250.00, 250.01). 1 each 1   Cholecalciferol (VITAMIN D3) 25 MCG (1000 UT) CAPS Take 1 capsule by mouth daily.     folic acid (FOLVITE) 1 MG tablet Take 1 tablet (1 mg total) by mouth daily. 90 tablet 2   furosemide (LASIX) 20 MG tablet Take 1 tablet (20 mg total) by mouth 2 (two) times daily. 60 tablet 3   ipratropium-albuterol (DUONEB) 0.5-2.5 (3) MG/3ML SOLN Take 3 mLs by nebulization every 4 (four) hours as needed. 360 mL 2   metFORMIN (GLUCOPHAGE) 500 MG tablet Take 1 tablet (500 mg total) by mouth 2 (two) times daily. 180 tablet 1   Nebulizers (COMPRESSOR/NEBULIZER) MISC 1 Units by Does not apply route daily as needed. 1 each 0   pantoprazole (PROTONIX) 40 MG tablet Take 1 tablet (40 mg total) by mouth daily. 90 tablet 3   Potassium Chloride ER 20 MEQ TBCR Take 20 mEq by mouth daily. 1 tab daily by mouth--- take while taking Lasix/furosemide 30 tablet 1   prednisoLONE acetate (PRED FORTE) 1 % ophthalmic suspension 1 drop 2 (two) times daily.     rivaroxaban (XARELTO) 20 MG TABS tablet Take 1 tablet (20 mg total) by mouth daily with supper. 30 tablet 5   zinc sulfate 220 (50 Zn) MG capsule Take 1  capsule (220 mg total) by mouth daily. 30 capsule 1   No facility-administered medications prior to visit.    Allergies  Allergen Reactions   Penicillins Rash    Has patient had a PCN reaction causing immediate rash, facial/tongue/throat swelling, SOB or lightheadedness with hypotension: {Yes/No:[id number] Has patient had a PCN reaction causing severe rash involving mucus membranes or skin necrosis: Yes Has patient had a PCN reaction that required hospitalization No Has patient had a PCN reaction occurring within the last 10 years: No If all of the above answers are "NO", then may proceed with Cephalosporin use.  **Has tolerated keflex     ROS Review of Systems  Constitutional:  Negative for chills and fever.  HENT:  Negative for congestion, sinus pressure and sore throat.   Eyes:  Negative for pain and discharge.       Right eye blindness  Respiratory:  Positive for cough and shortness of breath.   Cardiovascular:  Negative for chest pain and palpitations.  Gastrointestinal:  Negative for abdominal pain, diarrhea, nausea and vomiting.  Endocrine: Negative for polydipsia and polyuria.  Genitourinary:  Negative for dysuria and hematuria.  Musculoskeletal:  Negative for neck pain and neck stiffness.  Skin:  Negative for rash.  Neurological:  Negative for dizziness and weakness.  Psychiatric/Behavioral:  Negative for agitation and behavioral problems.       Objective:    Physical Exam Vitals reviewed.  Constitutional:      General: She is not in acute distress.    Appearance: She is not diaphoretic.  HENT:     Head: Normocephalic and atraumatic.     Mouth/Throat:     Mouth: Mucous membranes are moist.  Eyes:     General: No scleral icterus.    Comments: Right eye blindness  Cardiovascular:     Rate and Rhythm: Normal rate and regular rhythm.     Pulses: Normal pulses.     Heart sounds: Normal heart sounds. No murmur heard. Pulmonary:     Breath sounds: Normal breath  sounds. No wheezing or rales.  Musculoskeletal:     Cervical back: Neck supple. No tenderness.     Right lower leg: No edema.     Left lower leg: No edema.  Skin:    General: Skin is warm.     Findings: No rash.  Neurological:     General: No focal deficit present.     Mental Status: She is alert and oriented to person, place, and time.     Sensory: No sensory deficit.     Motor: No weakness.  Psychiatric:        Mood and Affect: Mood normal.        Behavior: Behavior normal.     BP 131/77 (BP Location: Right Arm, Patient Position: Sitting, Cuff Size: Normal)   Pulse 91   Ht _0  (1.626 m)   Wt 130 lb (59 kg)   SpO2 (!) 71%   BMI 22.31 kg/m  Wt Readings from Last 3 Encounters:  01/24/22 130 lb (59 kg)  01/22/22 130 lb (59 kg)  01/13/22 132 lb 7.9 oz (60.1 kg)    Lab Results  Component Value Date   TSH 0.608 09/11/2021   Lab Results  Component Value Date   WBC 8.4 01/22/2022   HGB 9.1 (L) 01/22/2022   HCT 32.0 (L) 01/22/2022   MCV 75.7 (L) 01/22/2022   PLT 345 01/22/2022   Lab Results  Component Value Date   NA 142 01/22/2022   K 5.0 01/22/2022   CO2 32 01/22/2022   GLUCOSE 109 (H) 01/22/2022   BUN 24 (H) 01/22/2022   CREATININE 0.99 01/22/2022   BILITOT 0.7 01/22/2022   ALKPHOS 78 01/22/2022   AST 26 01/22/2022   ALT 27 01/22/2022   PROT 7.8 01/22/2022   ALBUMIN 3.3 (L) 01/22/2022   CALCIUM 9.4 01/22/2022   ANIONGAP 11 01/22/2022   EGFR 76 11/06/2021   Lab Results  Component Value Date   CHOL 133 09/11/2021   Lab Results  Component Value Date   HDL 54 09/11/2021   Lab Results  Component Value Date   LDLCALC 62 09/11/2021   Lab Results  Component Value Date   TRIG 88 09/11/2021   Lab Results  Component Value Date   CHOLHDL 2.5 09/11/2021   Lab Results  Component Value Date   HGBA1C 5.9 (H) 09/11/2021      Assessment & Plan:   Problem List Items Addressed This Visit       Cardiovascular and Mediastinum   Essential  hypertension - Primary    BP Readings from Last 1 Encounters:  01/24/22 131/77  Well-controlled with amlodipine olmesartan recently discontinued due to low BP during hospitalization Counseled for compliance with the medications Advised DASH diet and moderate exercise/walking as tolerated      Pulmonary emboli ([place])    Was admitted for PE in 07/23 Stopped taking Eliquis due to rash Had switched to Xarelto 20 mg QD, needs to continue taking it for 3 months from now Emphasized importance of AC        Other   Symptomatic anemia    Due to UGIB, now resolved No melena or hematochezia S/p 1 U PRBC Check CBC in the next visit Started fusion plus supplement      Fall    Fall at home, likely presyncope Advised to maintain adequate hydration ER visit chart reviewed, including imaging Soreness around neck and shoulder area improving        No orders of the defined types were placed in this encounter.  Follow-up: Return in about 4 months (around 05/26/2022), or if symptoms worsen or fail to improve.    [name], MD

## 2022-01-24 NOTE — Assessment & Plan Note (Signed)
BP Readings from Last 1 Encounters:  01/24/22 131/77   Well-controlled with amlodipine olmesartan recently discontinued due to low BP during hospitalization Counseled for compliance with the medications Advised DASH diet and moderate exercise/walking as tolerated

## 2022-01-24 NOTE — Assessment & Plan Note (Signed)
Due to UGIB, now resolved No melena or hematochezia S/p 1 U PRBC Check CBC in the next visit Started fusion plus supplement

## 2022-01-24 NOTE — Assessment & Plan Note (Signed)
Fall at home, likely presyncope Advised to maintain adequate hydration ER visit chart reviewed, including imaging Soreness around neck and shoulder area improving

## 2022-01-24 NOTE — Assessment & Plan Note (Signed)
Was admitted for PE in 07/23 Stopped taking Eliquis due to rash Had switched to Xarelto 20 mg QD, needs to continue taking it for 3 months from now Emphasized importance of [facility]

## 2022-01-25 ENCOUNTER — Ambulatory Visit: Payer: Self-pay | Admitting: *Deleted

## 2022-01-25 ENCOUNTER — Encounter: Payer: Self-pay | Admitting: *Deleted

## 2022-01-25 NOTE — Patient Outreach (Signed)
  Care Coordination   Follow Up Visit Note   01/25/2022 Name: Marisa Bailey MRN: [id number] DOB: May 06, 1940  Marisa Bailey is a 81 y.o. year old female who sees Marisa Spar, MD for primary care. I spoke with  Marisa Bailey by phone today.  What matters to the patients health and wellness today?  Fall prevention    Goals Addressed             This Visit's Progress    Absence of Fall and Fall-Related Injury       Care Coordination Interventions: Reviewed medications and discussed potential side effects of medications such as dizziness and frequent urination Advised patient of importance of notifying provider of falls Assessed for falls since last encounter Assessed patients knowledge of fall risk prevention secondary to previously provided education Assessed social determinant of health barriers Provided with written information on [employer] that may available to her through [facility] Provided with [facility] contact information and encouraged to reach out as needed         SDOH assessments and interventions completed:  Yes  SDOH Interventions Today    Flowsheet Row Most Recent Value  SDOH Interventions   Food Insecurity Interventions Intervention Not Indicated  Housing Interventions Intervention Not Indicated  Transportation Interventions Intervention Not Indicated  Utilities Interventions Intervention Not Indicated  Financial Strain Interventions Intervention Not Indicated        Care Coordination Interventions:  Yes, provided   Follow up plan: Follow up call scheduled for 02/25/22    Encounter Outcome:  Pt. Visit Completed

## 2022-01-26 DIAGNOSIS — J449 Chronic obstructive pulmonary disease, unspecified: Secondary | ICD-10-CM | POA: Diagnosis not present

## 2022-01-27 ENCOUNTER — Emergency Department (HOSPITAL_COMMUNITY)
Admission: EM | Admit: 2022-01-27 | Discharge: 2022-01-27 | Disposition: A | Discharge status: Home / Self Care | Payer: Medicare Other | Attending: Emergency Medicine | Admitting: Emergency Medicine

## 2022-01-27 ENCOUNTER — Emergency Department (HOSPITAL_COMMUNITY): Payer: Medicare Other

## 2022-01-27 ENCOUNTER — Encounter (HOSPITAL_COMMUNITY): Payer: Self-pay

## 2022-01-27 ENCOUNTER — Other Ambulatory Visit: Payer: Self-pay

## 2022-01-27 DIAGNOSIS — R0602 Shortness of breath: Secondary | ICD-10-CM | POA: Diagnosis not present

## 2022-01-27 DIAGNOSIS — M546 Pain in thoracic spine: Secondary | ICD-10-CM | POA: Diagnosis not present

## 2022-01-27 DIAGNOSIS — I110 Hypertensive heart disease with heart failure: Secondary | ICD-10-CM | POA: Insufficient documentation

## 2022-01-27 DIAGNOSIS — J811 Chronic pulmonary edema: Secondary | ICD-10-CM | POA: Diagnosis not present

## 2022-01-27 DIAGNOSIS — R059 Cough, unspecified: Secondary | ICD-10-CM | POA: Diagnosis not present

## 2022-01-27 DIAGNOSIS — R079 Chest pain, unspecified: Secondary | ICD-10-CM | POA: Diagnosis not present

## 2022-01-27 DIAGNOSIS — R0902 Hypoxemia: Secondary | ICD-10-CM | POA: Diagnosis not present

## 2022-01-27 DIAGNOSIS — I509 Heart failure, unspecified: Secondary | ICD-10-CM | POA: Diagnosis not present

## 2022-01-27 DIAGNOSIS — Z79899 Other long term (current) drug therapy: Secondary | ICD-10-CM | POA: Insufficient documentation

## 2022-01-27 DIAGNOSIS — Z7901 Long term (current) use of anticoagulants: Secondary | ICD-10-CM | POA: Insufficient documentation

## 2022-01-27 DIAGNOSIS — J449 Chronic obstructive pulmonary disease, unspecified: Secondary | ICD-10-CM | POA: Insufficient documentation

## 2022-01-27 DIAGNOSIS — R9431 Abnormal electrocardiogram [ECG] [EKG]: Secondary | ICD-10-CM | POA: Diagnosis not present

## 2022-01-27 DIAGNOSIS — R918 Other nonspecific abnormal finding of lung field: Secondary | ICD-10-CM

## 2022-01-27 DIAGNOSIS — M545 Low back pain, unspecified: Secondary | ICD-10-CM | POA: Diagnosis not present

## 2022-01-27 LAB — CBC WITH DIFFERENTIAL/PLATELET
Abs Immature Granulocytes: 0.11 10*3/uL — ABNORMAL HIGH (ref 0.00–0.07)
Basophils Absolute: 0 10*3/uL (ref 0.0–0.1)
Basophils Relative: 0 %
Eosinophils Absolute: 0.2 10*3/uL (ref 0.0–0.5)
Eosinophils Relative: 2 %
HCT: 35.2 % — ABNORMAL LOW (ref 36.0–46.0)
Hemoglobin: 10.1 g/dL — ABNORMAL LOW (ref 12.0–15.0)
Immature Granulocytes: 1 %
Lymphocytes Relative: 7 %
Lymphs Abs: 0.7 10*3/uL (ref 0.7–4.0)
MCH: 21.5 pg — ABNORMAL LOW (ref 26.0–34.0)
MCHC: 28.7 g/dL — ABNORMAL LOW (ref 30.0–36.0)
MCV: 75.1 fL — ABNORMAL LOW (ref 80.0–100.0)
Monocytes Absolute: 0.6 10*3/uL (ref 0.1–1.0)
Monocytes Relative: 6 %
Neutro Abs: 8.8 10*3/uL — ABNORMAL HIGH (ref 1.7–7.7)
Neutrophils Relative %: 84 %
Platelets: 423 10*3/uL — ABNORMAL HIGH (ref 150–400)
RBC: 4.69 MIL/uL (ref 3.87–5.11)
RDW: 22.5 % — ABNORMAL HIGH (ref 11.5–15.5)
WBC: 10.5 10*3/uL (ref 4.0–10.5)
nRBC: 0 % (ref 0.0–0.2)

## 2022-01-27 LAB — BASIC METABOLIC PANEL
Anion gap: 10 (ref 5–15)
BUN: 37 mg/dL — ABNORMAL HIGH (ref 8–23)
CO2: 31 mmol/L (ref 22–32)
Calcium: 8.3 mg/dL — ABNORMAL LOW (ref 8.9–10.3)
Chloride: 98 mmol/L (ref 98–111)
Creatinine, Ser: 1.5 mg/dL — ABNORMAL HIGH (ref 0.44–1.00)
GFR, Estimated: 35 mL/min — ABNORMAL LOW (ref >60)
Glucose, Bld: 96 mg/dL (ref 70–99)
Potassium: 4.9 mmol/L (ref 3.5–5.1)
Sodium: 139 mmol/L (ref 135–145)

## 2022-01-27 LAB — TROPONIN I (HIGH SENSITIVITY)
Troponin I (High Sensitivity): 5 ng/L (ref <18)
Troponin I (High Sensitivity): 4 ng/L (ref <18)

## 2022-01-27 LAB — CBG MONITORING, ED: Glucose-Capillary: 99 mg/dL (ref 70–99)

## 2022-01-27 MED ORDER — OXYCODONE-ACETAMINOPHEN 5-325 MG PO TABS
2.0000 | ORAL_TABLET | Freq: Once | ORAL | Status: AC
  Administered 2022-01-27: 2 via ORAL
  Filled 2022-01-27: qty 2

## 2022-01-27 MED ORDER — IOHEXOL 350 MG/ML SOLN
60.0000 mL | Freq: Once | INTRAVENOUS | Status: AC | PRN
  Administered 2022-01-27: 60 mL via INTRAVENOUS

## 2022-01-27 MED ORDER — ONDANSETRON HCL 4 MG/2ML IJ SOLN
INTRAMUSCULAR | Status: AC
  Administered 2022-01-27: 4 mg
  Filled 2022-01-27: qty 2

## 2022-01-27 NOTE — ED Notes (Signed)
Pt's 02 sat 85% on room air.  Denies SOB.  Says is on o2 at 4liters prn at home.

## 2022-01-27 NOTE — ED Triage Notes (Signed)
Pt c/o pain in left upper and mid back for past few days.  Denis any injury.

## 2022-01-27 NOTE — ED Notes (Signed)
Patient continues to complain of "feeling sick". No vomiting. O2 sat dropped to 86% on 2L via [place]. Oxygen increased to 4L via [place]. Patient remains A&O x 4. Side rails up. Call bell in reach.

## 2022-01-27 NOTE — ED Provider Notes (Signed)
[facility] EMERGENCY DEPARTMENT Provider Note   CSN: [id number] Arrival date & time: 01/27/22  1058     History  Chief Complaint  Patient presents with   Back Pain    Marisa Bailey is a 81 y.o. female.   Back Pain  This patient is an 81 year old female, she has a history of congestive heart failure, COPD, pulmonary embolism, she has essential hypertension, she is supposed to be taking Xarelto and states that she is compliant with his medication.  She had been admitted to the hospital most recently about 2-1/2 weeks ago with a CHF exacerbation.  During that time she spent about 5 days in the hospital, she was 84% on 2 L, she had increased to 4 L and oxygen went up to 92%.  That was in the emergency department.  She was also noted to be anemic, she had been diagnosed with pulmonary embolism in July of this year and was to remain on the Xarelto.  The patient had some anemia, she got 1 unit of packed red blood cells, she had a prior EGD showing a Dialar Foy lesion which had been cauterized, the patient denies any ongoing bleeding at this time.  She presents of a complaint of left mid to upper back pain, this is been going on for couple of days, it is sometimes worse when she tries to move around but seems like it is there all the time.  It does not radiate into her arm or down her legs, she has no difficulty breathing but has had some cough with it.  No fevers or chills.  The patient was discharged home on oxygen, diuretics after last admission, she followed up with her family doctor 3 days ago, I have reviewed those records, she uses the oxygen only at nighttime and as needed when she is dyspneic.    Home Medications Prior to Admission medications   Medication Sig Start Date End Date Taking? Authorizing Provider  ACCU-CHEK GUIDE test strip SMARTSIG:Via Meter 1-4 Times Daily 01/14/22   [provider]  Accu-Chek Softclix Lancets lancets SMARTSIG:Via Meter 1-4 Times Daily 01/14/22    [provider]  albuterol (PROVENTIL) (2.5 MG/3ML) 0.083% nebulizer solution Take 3 mLs (2.5 mg total) by nebulization every 2 (two) hours as needed for wheezing or shortness of breath. 01/14/22   Roxan Hockey, MD  albuterol (VENTOLIN HFA) 108 (90 Base) MCG/ACT inhaler Inhale 2 puffs into the lungs every 6 (six) hours as needed for wheezing or shortness of breath. 01/14/22   Roxan Hockey, MD  amLODipine (NORVASC) 5 MG tablet Take 1 tablet (5 mg total) by mouth daily. 01/14/22   Roxan Hockey, MD  atorvastatin (LIPITOR) 20 MG tablet Take 1 tablet (20 mg total) by mouth daily. 01/14/22   Roxan Hockey, MD  blood glucose meter kit and supplies KIT Dispense based on patient and insurance preference. Use up to four times daily as directed. 01/14/22   Roxan Hockey, MD  blood glucose meter kit and supplies Dispense based on patient and insurance preference. Use up to four times daily as directed. (FOR ICD-9 250.00, 250.01). 01/14/22   Roxan Hockey, MD  Cholecalciferol (VITAMIN D3) 25 MCG (1000 UT) CAPS Take 1 capsule by mouth daily.    [provider]  folic acid (FOLVITE) 1 MG tablet Take 1 tablet (1 mg total) by mouth daily. 01/14/22   Roxan Hockey, MD  furosemide (LASIX) 20 MG tablet Take 1 tablet (20 mg total) by mouth 2 (two) times  daily. 01/14/22 01/14/23  Roxan Hockey, MD  ipratropium-albuterol (DUONEB) 0.5-2.5 (3) MG/3ML SOLN Take 3 mLs by nebulization every 4 (four) hours as needed. 01/14/22   Emokpae, Courage, MD  KLOR-CON M20 20 MEQ tablet Take 20 mEq by mouth daily. 01/14/22   [provider]  metFORMIN (GLUCOPHAGE) 500 MG tablet Take 1 tablet (500 mg total) by mouth 2 (two) times daily. 01/14/22   Roxan Hockey, MD  Nebulizers (COMPRESSOR/NEBULIZER) MISC 1 Units by Does not apply route daily as needed. 01/14/22   Roxan Hockey, MD  pantoprazole (PROTONIX) 40 MG tablet Take 1 tablet (40 mg total) by mouth daily. 01/14/22   Roxan Hockey, MD   Potassium Chloride ER 20 MEQ TBCR Take 20 mEq by mouth daily. 1 tab daily by mouth--- take while taking Lasix/furosemide 01/14/22   Roxan Hockey, MD  prednisoLONE acetate (PRED FORTE) 1 % ophthalmic suspension 1 drop 2 (two) times daily. 08/06/21   [provider]  rivaroxaban (XARELTO) 20 MG TABS tablet Take 1 tablet (20 mg total) by mouth daily with supper. 01/14/22   Roxan Hockey, MD  zinc sulfate 220 (50 Zn) MG capsule Take 1 capsule (220 mg total) by mouth daily. 01/15/22   Roxan Hockey, MD      Allergies    Penicillins    Review of Systems   Review of Systems  Musculoskeletal:  Positive for back pain.  All other systems reviewed and are negative.   Physical Exam Updated Vital Signs BP (!) 115/57   Pulse 90   Temp 98.4 F (36.9 C) (Oral)   Resp 18   Ht 1.626 m (_0 )   Wt 59 kg   SpO2 (!) 85%   BMI 22.31 kg/m  Physical Exam Vitals and nursing note reviewed.  Constitutional:      General: She is not in acute distress.    Appearance: She is well-developed.  HENT:     Head: Normocephalic and atraumatic.     Mouth/Throat:     Pharynx: No oropharyngeal exudate.  Eyes:     General: No scleral icterus.       Right eye: No discharge.        Left eye: No discharge.     Conjunctiva/sclera: Conjunctivae normal.     Pupils: Pupils are equal, round, and reactive to light.  Neck:     Thyroid: No thyromegaly.     Vascular: No JVD.  Cardiovascular:     Rate and Rhythm: Normal rate and regular rhythm.     Heart sounds: Normal heart sounds. No murmur heard.    No friction rub. No gallop.  Pulmonary:     Effort: Pulmonary effort is normal. No respiratory distress.     Breath sounds: Normal breath sounds. No wheezing or rales.  Chest:     Chest wall: No tenderness.  Abdominal:     General: Bowel sounds are normal. There is no distension.     Palpations: Abdomen is soft. There is no mass.     Tenderness: There is no abdominal tenderness.  Musculoskeletal:         General: Tenderness present. Normal range of motion.     Cervical back: Normal range of motion and neck supple.     Comments: Joints are supple diffusely, compartments are soft diffusely, there is some tenderness in the paraspinal muscles of the left mid and upper back but not in the midline over the spinous processes and not over the ribs.  There is no signs of  rashes over the thoracic cavity  Lymphadenopathy:     Cervical: No cervical adenopathy.  Skin:    General: Skin is warm and dry.     Findings: No erythema or rash.  Neurological:     Mental Status: She is alert.     Coordination: Coordination normal.  Psychiatric:        Behavior: Behavior normal.     ED Results / Procedures / Treatments   Labs (all labs ordered are listed, but only abnormal results are displayed) Labs Reviewed  CBC WITH DIFFERENTIAL/PLATELET  BASIC METABOLIC PANEL  TROPONIN I (HIGH SENSITIVITY)    EKG None  Radiology No results found.  Procedures Procedures    Medications Ordered in ED Medications - No data to display  ED Course/ Medical Decision Making/ A&P                           Medical Decision Making Amount and/or Complexity of Data Reviewed Labs: ordered. Radiology: ordered.  Risk Prescription drug management.   This patient presents to the ED for concern of back pain, this involves an extensive number of treatment options, and is a complaint that carries with it a high risk of complications and morbidity.  The differential diagnosis includes recurrent pulmonary embolism due to noncompliance could be pneumothorax, could be pneumonia, could be muscular pain, seems unlikely to be aortic dissection especially given the CT angiogram which I reviewed from July which showed no signs of abnormal aorta and the patient is normotensive here at 115/57   Co morbidities that complicate the patient evaluation  COPD, congestive heart failure, pulmonary embolism   Additional history  obtained:  Additional history obtained from electronic medical record External records from outside source obtained and reviewed including prior admissions to the hospital, prior CT angiograms, follow-up in the office, see HPI   Lab Tests:  I Ordered, and personally interpreted labs.  The pertinent results include:  slight elevatgion in Cr   Imaging Studies ordered:  I ordered imaging studies including CTA  I independently visualized and interpreted imaging which showed pending at the time of change of shift I agree with the radiologist interpretation   Cardiac Monitoring: / EKG:  The patient was maintained on a cardiac monitor.  I personally viewed and interpreted the cardiac monitored which showed an underlying rhythm of: nsr   Consultations Obtained:  I requested consultation with the oncomin EDP - Dr,. Zackowski,  and discussed lab and imaging findings as well as pertinent plan - they will f/u results and disposition accordingly.  Social Determinants of Health:  none   Test / Admission - Considered:  May need admission depending on CT results         Final Clinical Impression(s) / ED Diagnoses Final diagnoses:  None    Rx / DC Orders ED Discharge Orders     None         Noemi Chapel, MD 02/04/22 0840

## 2022-01-27 NOTE — ED Notes (Signed)
Patient up to bathroom. Oxygen saturation dropped to 82% with ambulation. Provider informed. Patient assisted back to bed.

## 2022-01-27 NOTE — ED Notes (Signed)
Pt noted profusely sweating and not feeling good. A towel was handed to the pt and a EKG was performed. EDP aware and came to bedside to assess pt. Glucose checked and WNL. Assigned nurse aware.

## 2022-01-27 NOTE — ED Notes (Signed)
Patient states she is ready for discharge and will be driving herself home. Patient still states she "feels sick" and is drowsy possibly due to pain medication given during her visit. Explained to the patient that she would need to have someone bring her oxygen tank from home and drive her home in order to go home now. Dr. Rogene Houston informed.

## 2022-01-27 NOTE — ED Provider Notes (Signed)
CT scan without evidence of any new pulmonary embolus.  It does raise concerns for some pulmonary adenopathy and may be pulmonary mass.  This was not present on the CT scan in July.  However close follow-up with her primary care doctor is important and they are recommending a PET scan.  In the meantime patient can increase and use the oxygen she is already on at home at nighttime throughout the day.  No evidence of any pneumonia.   Fredia Sorrow, MD 01/27/22 1705

## 2022-01-27 NOTE — Discharge Instructions (Signed)
Today's results show no evidence of any of new blood clots.  There is concerns for enlarged lymph nodes and may be pulmonary mass that is new since July.  Close follow-up with primary care doctor indicated may need a PET scan to further evaluate this.  In the meantime use your oxygen that you have at home at all times.  And make an appointment to follow-up with your doctor.

## 2022-01-27 NOTE — ED Notes (Signed)
Patient transported to CT 

## 2022-01-28 DIAGNOSIS — I110 Hypertensive heart disease with heart failure: Secondary | ICD-10-CM | POA: Diagnosis not present

## 2022-01-28 DIAGNOSIS — E119 Type 2 diabetes mellitus without complications: Secondary | ICD-10-CM | POA: Diagnosis not present

## 2022-01-28 DIAGNOSIS — J9621 Acute and chronic respiratory failure with hypoxia: Secondary | ICD-10-CM | POA: Diagnosis not present

## 2022-01-28 DIAGNOSIS — I5033 Acute on chronic diastolic (congestive) heart failure: Secondary | ICD-10-CM | POA: Diagnosis not present

## 2022-01-28 DIAGNOSIS — Z7984 Long term (current) use of oral hypoglycemic drugs: Secondary | ICD-10-CM | POA: Diagnosis not present

## 2022-01-28 DIAGNOSIS — M6281 Muscle weakness (generalized): Secondary | ICD-10-CM | POA: Diagnosis not present

## 2022-01-28 DIAGNOSIS — Z86711 Personal history of pulmonary embolism: Secondary | ICD-10-CM | POA: Diagnosis not present

## 2022-01-28 DIAGNOSIS — D649 Anemia, unspecified: Secondary | ICD-10-CM | POA: Diagnosis not present

## 2022-01-28 DIAGNOSIS — J449 Chronic obstructive pulmonary disease, unspecified: Secondary | ICD-10-CM | POA: Diagnosis not present

## 2022-01-28 DIAGNOSIS — F17210 Nicotine dependence, cigarettes, uncomplicated: Secondary | ICD-10-CM | POA: Diagnosis not present

## 2022-01-31 ENCOUNTER — Telehealth: Payer: Self-pay

## 2022-01-31 NOTE — Telephone Encounter (Signed)
        Patient  visited [place] on 12/17     Telephone encounter attempt :  1st   Unable to leave a message     [place], [facility]  [phone] [address], [place], [place] [address] Phone: [phone] Email: [email or web address]

## 2022-02-05 ENCOUNTER — Telehealth: Payer: Self-pay

## 2022-02-05 DIAGNOSIS — J479 Bronchiectasis, uncomplicated: Secondary | ICD-10-CM | POA: Diagnosis not present

## 2022-02-05 NOTE — Telephone Encounter (Signed)
     Patient  visit on 12/17  at [place] you been able to follow up with your primary care physician? Yes   The patient was or was not able to obtain any needed medicine or equipment. Yes   Are there diet recommendations that you are having difficulty following? Na   Patient expresses understanding of discharge instructions and education provided has no other needs at this time.  Yes      [place], [facility], [facility] Management  [phone] [address], [place], [place] [address] Phone: [phone] Email: [email or web address]

## 2022-02-07 DIAGNOSIS — M6281 Muscle weakness (generalized): Secondary | ICD-10-CM | POA: Diagnosis not present

## 2022-02-07 DIAGNOSIS — Z86711 Personal history of pulmonary embolism: Secondary | ICD-10-CM | POA: Diagnosis not present

## 2022-02-07 DIAGNOSIS — D649 Anemia, unspecified: Secondary | ICD-10-CM | POA: Diagnosis not present

## 2022-02-07 DIAGNOSIS — I5033 Acute on chronic diastolic (congestive) heart failure: Secondary | ICD-10-CM | POA: Diagnosis not present

## 2022-02-07 DIAGNOSIS — Z7984 Long term (current) use of oral hypoglycemic drugs: Secondary | ICD-10-CM | POA: Diagnosis not present

## 2022-02-07 DIAGNOSIS — E119 Type 2 diabetes mellitus without complications: Secondary | ICD-10-CM | POA: Diagnosis not present

## 2022-02-07 DIAGNOSIS — J449 Chronic obstructive pulmonary disease, unspecified: Secondary | ICD-10-CM | POA: Diagnosis not present

## 2022-02-07 DIAGNOSIS — F17210 Nicotine dependence, cigarettes, uncomplicated: Secondary | ICD-10-CM | POA: Diagnosis not present

## 2022-02-07 DIAGNOSIS — I110 Hypertensive heart disease with heart failure: Secondary | ICD-10-CM | POA: Diagnosis not present

## 2022-02-07 DIAGNOSIS — J9621 Acute and chronic respiratory failure with hypoxia: Secondary | ICD-10-CM | POA: Diagnosis not present

## 2022-02-11 DIAGNOSIS — J449 Chronic obstructive pulmonary disease, unspecified: Secondary | ICD-10-CM | POA: Diagnosis not present

## 2022-02-12 DIAGNOSIS — Z86711 Personal history of pulmonary embolism: Secondary | ICD-10-CM | POA: Diagnosis not present

## 2022-02-12 DIAGNOSIS — F17210 Nicotine dependence, cigarettes, uncomplicated: Secondary | ICD-10-CM | POA: Diagnosis not present

## 2022-02-12 DIAGNOSIS — D649 Anemia, unspecified: Secondary | ICD-10-CM | POA: Diagnosis not present

## 2022-02-12 DIAGNOSIS — I5033 Acute on chronic diastolic (congestive) heart failure: Secondary | ICD-10-CM | POA: Diagnosis not present

## 2022-02-12 DIAGNOSIS — E119 Type 2 diabetes mellitus without complications: Secondary | ICD-10-CM | POA: Diagnosis not present

## 2022-02-12 DIAGNOSIS — I110 Hypertensive heart disease with heart failure: Secondary | ICD-10-CM | POA: Diagnosis not present

## 2022-02-12 DIAGNOSIS — Z7984 Long term (current) use of oral hypoglycemic drugs: Secondary | ICD-10-CM | POA: Diagnosis not present

## 2022-02-12 DIAGNOSIS — M6281 Muscle weakness (generalized): Secondary | ICD-10-CM | POA: Diagnosis not present

## 2022-02-12 DIAGNOSIS — J9621 Acute and chronic respiratory failure with hypoxia: Secondary | ICD-10-CM | POA: Diagnosis not present

## 2022-02-12 DIAGNOSIS — J449 Chronic obstructive pulmonary disease, unspecified: Secondary | ICD-10-CM | POA: Diagnosis not present

## 2022-02-13 NOTE — Progress Notes (Signed)
   Care Guide Note  02/13/2022 Name: ENOLIA KOEPKE MRN: [id number] DOB: 1940/10/27  Referred by: [name], MD Reason for referral : Care Coordination (Outreach to schedule with Pharm d Grayland Ormond)   MEEKA CARTELLI is a 82 y.o. year old female who is a primary care patient of Posey Pronto, Colin Broach, MD. AAREN KROG was referred to the pharmacist for assistance related to DM.    A third unsuccessful telephone outreach was attempted today to contact the patient who was referred to the pharmacy team for assistance with medication management. The Population Health team is pleased to engage with this patient at any time in the future upon receipt of referral and should he/she be interested in assistance from the [facility] team.   Noreene Larsson, [place], [place] [address] Direct Dial: [phone] [email or web address]

## 2022-02-17 DIAGNOSIS — J449 Chronic obstructive pulmonary disease, unspecified: Secondary | ICD-10-CM | POA: Diagnosis not present

## 2022-02-20 DIAGNOSIS — M6281 Muscle weakness (generalized): Secondary | ICD-10-CM | POA: Diagnosis not present

## 2022-02-20 DIAGNOSIS — E119 Type 2 diabetes mellitus without complications: Secondary | ICD-10-CM | POA: Diagnosis not present

## 2022-02-20 DIAGNOSIS — F17210 Nicotine dependence, cigarettes, uncomplicated: Secondary | ICD-10-CM | POA: Diagnosis not present

## 2022-02-20 DIAGNOSIS — J449 Chronic obstructive pulmonary disease, unspecified: Secondary | ICD-10-CM | POA: Diagnosis not present

## 2022-02-20 DIAGNOSIS — J9621 Acute and chronic respiratory failure with hypoxia: Secondary | ICD-10-CM | POA: Diagnosis not present

## 2022-02-20 DIAGNOSIS — D649 Anemia, unspecified: Secondary | ICD-10-CM | POA: Diagnosis not present

## 2022-02-20 DIAGNOSIS — I110 Hypertensive heart disease with heart failure: Secondary | ICD-10-CM | POA: Diagnosis not present

## 2022-02-20 DIAGNOSIS — Z86711 Personal history of pulmonary embolism: Secondary | ICD-10-CM | POA: Diagnosis not present

## 2022-02-20 DIAGNOSIS — Z7984 Long term (current) use of oral hypoglycemic drugs: Secondary | ICD-10-CM | POA: Diagnosis not present

## 2022-02-20 DIAGNOSIS — I5033 Acute on chronic diastolic (congestive) heart failure: Secondary | ICD-10-CM | POA: Diagnosis not present

## 2022-02-23 ENCOUNTER — Emergency Department (HOSPITAL_COMMUNITY)
Admission: EM | Admit: 2022-02-23 | Discharge: 2022-02-23 | Disposition: A | Discharge status: Home / Self Care | Payer: 59 | Source: Home / Self Care | Attending: Emergency Medicine | Admitting: Emergency Medicine

## 2022-02-23 ENCOUNTER — Emergency Department (HOSPITAL_COMMUNITY): Payer: 59

## 2022-02-23 DIAGNOSIS — Z9981 Dependence on supplemental oxygen: Secondary | ICD-10-CM | POA: Diagnosis not present

## 2022-02-23 DIAGNOSIS — Z515 Encounter for palliative care: Secondary | ICD-10-CM | POA: Diagnosis not present

## 2022-02-23 DIAGNOSIS — Z7984 Long term (current) use of oral hypoglycemic drugs: Secondary | ICD-10-CM | POA: Insufficient documentation

## 2022-02-23 DIAGNOSIS — I2723 Pulmonary hypertension due to lung diseases and hypoxia: Secondary | ICD-10-CM | POA: Diagnosis not present

## 2022-02-23 DIAGNOSIS — I509 Heart failure, unspecified: Secondary | ICD-10-CM | POA: Insufficient documentation

## 2022-02-23 DIAGNOSIS — M545 Low back pain, unspecified: Secondary | ICD-10-CM | POA: Insufficient documentation

## 2022-02-23 DIAGNOSIS — H5461 Unqualified visual loss, right eye, normal vision left eye: Secondary | ICD-10-CM | POA: Diagnosis not present

## 2022-02-23 DIAGNOSIS — Z66 Do not resuscitate: Secondary | ICD-10-CM | POA: Diagnosis not present

## 2022-02-23 DIAGNOSIS — I110 Hypertensive heart disease with heart failure: Secondary | ICD-10-CM | POA: Diagnosis not present

## 2022-02-23 DIAGNOSIS — I5033 Acute on chronic diastolic (congestive) heart failure: Secondary | ICD-10-CM | POA: Diagnosis not present

## 2022-02-23 DIAGNOSIS — J9819 Other pulmonary collapse: Secondary | ICD-10-CM | POA: Diagnosis not present

## 2022-02-23 DIAGNOSIS — Z79899 Other long term (current) drug therapy: Secondary | ICD-10-CM | POA: Insufficient documentation

## 2022-02-23 DIAGNOSIS — M47816 Spondylosis without myelopathy or radiculopathy, lumbar region: Secondary | ICD-10-CM | POA: Diagnosis not present

## 2022-02-23 DIAGNOSIS — E785 Hyperlipidemia, unspecified: Secondary | ICD-10-CM | POA: Diagnosis not present

## 2022-02-23 DIAGNOSIS — E875 Hyperkalemia: Secondary | ICD-10-CM | POA: Diagnosis not present

## 2022-02-23 DIAGNOSIS — Z743 Need for continuous supervision: Secondary | ICD-10-CM | POA: Diagnosis not present

## 2022-02-23 DIAGNOSIS — I2782 Chronic pulmonary embolism: Secondary | ICD-10-CM | POA: Diagnosis not present

## 2022-02-23 DIAGNOSIS — D649 Anemia, unspecified: Secondary | ICD-10-CM | POA: Diagnosis not present

## 2022-02-23 DIAGNOSIS — M549 Dorsalgia, unspecified: Secondary | ICD-10-CM | POA: Diagnosis not present

## 2022-02-23 DIAGNOSIS — I5031 Acute diastolic (congestive) heart failure: Secondary | ICD-10-CM | POA: Diagnosis not present

## 2022-02-23 DIAGNOSIS — Z7189 Other specified counseling: Secondary | ICD-10-CM | POA: Diagnosis not present

## 2022-02-23 DIAGNOSIS — Z7901 Long term (current) use of anticoagulants: Secondary | ICD-10-CM | POA: Insufficient documentation

## 2022-02-23 DIAGNOSIS — J9811 Atelectasis: Secondary | ICD-10-CM | POA: Diagnosis not present

## 2022-02-23 DIAGNOSIS — E119 Type 2 diabetes mellitus without complications: Secondary | ICD-10-CM | POA: Insufficient documentation

## 2022-02-23 DIAGNOSIS — I2694 Multiple subsegmental pulmonary emboli without acute cor pulmonale: Secondary | ICD-10-CM | POA: Diagnosis not present

## 2022-02-23 DIAGNOSIS — J810 Acute pulmonary edema: Secondary | ICD-10-CM | POA: Diagnosis not present

## 2022-02-23 DIAGNOSIS — J90 Pleural effusion, not elsewhere classified: Secondary | ICD-10-CM | POA: Diagnosis not present

## 2022-02-23 DIAGNOSIS — J449 Chronic obstructive pulmonary disease, unspecified: Secondary | ICD-10-CM | POA: Insufficient documentation

## 2022-02-23 DIAGNOSIS — Z880 Allergy status to penicillin: Secondary | ICD-10-CM | POA: Diagnosis not present

## 2022-02-23 DIAGNOSIS — J9621 Acute and chronic respiratory failure with hypoxia: Secondary | ICD-10-CM | POA: Diagnosis not present

## 2022-02-23 DIAGNOSIS — Z1152 Encounter for screening for COVID-19: Secondary | ICD-10-CM | POA: Diagnosis not present

## 2022-02-23 DIAGNOSIS — R0602 Shortness of breath: Secondary | ICD-10-CM | POA: Diagnosis not present

## 2022-02-23 DIAGNOSIS — R0902 Hypoxemia: Secondary | ICD-10-CM | POA: Diagnosis not present

## 2022-02-23 DIAGNOSIS — Z833 Family history of diabetes mellitus: Secondary | ICD-10-CM | POA: Diagnosis not present

## 2022-02-23 DIAGNOSIS — J9601 Acute respiratory failure with hypoxia: Secondary | ICD-10-CM | POA: Diagnosis not present

## 2022-02-23 DIAGNOSIS — Z9842 Cataract extraction status, left eye: Secondary | ICD-10-CM | POA: Diagnosis not present

## 2022-02-23 DIAGNOSIS — W19XXXA Unspecified fall, initial encounter: Secondary | ICD-10-CM | POA: Insufficient documentation

## 2022-02-23 DIAGNOSIS — F17210 Nicotine dependence, cigarettes, uncomplicated: Secondary | ICD-10-CM | POA: Diagnosis not present

## 2022-02-23 DIAGNOSIS — I5021 Acute systolic (congestive) heart failure: Secondary | ICD-10-CM | POA: Diagnosis not present

## 2022-02-23 LAB — URINALYSIS, ROUTINE W REFLEX MICROSCOPIC
Bacteria, UA: NONE SEEN
Bilirubin Urine: NEGATIVE
Glucose, UA: NEGATIVE mg/dL
Hgb urine dipstick: NEGATIVE
Ketones, ur: NEGATIVE mg/dL
Leukocytes,Ua: NEGATIVE
Nitrite: NEGATIVE
Protein, ur: 30 mg/dL — AB
Specific Gravity, Urine: 1.016 (ref 1.005–1.030)
pH: 5 (ref 5.0–8.0)

## 2022-02-23 NOTE — ED Provider Notes (Signed)
[facility] EMERGENCY DEPARTMENT Provider Note   CSN: [id number] Arrival date & time: 02/23/22  1331     History  Chief Complaint  Patient presents with   Back Pain   Fall    Marisa Bailey is a 82 y.o. female.  With a history of COPD on 4 L via nasal cannula at baseline, chronic respiratory failure, CHF, hypertension, diabetes who presents to the ED for evaluation of low back pain.  Symptoms have been present for approximately 1 week.  States she has had a couple days where her pain subsided, however it returns the next day.  She has taken Tylenol at home for symptoms with moderate improvement.  Denies saddle paresthesias, urinary or fecal incontinence, fevers, numbness weakness or tingling.  States she does have urinary frequency over the past week as well.  Pain is across the entire low back.  She does not know if movement makes it worse, states that it just "hurts all the time."  Denies injury.  She is ambulatory.  Of note, patient checked in to the desk for back pain and attempted to sit down in a wheelchair.  She lost her footing when she was trying to sit down and fell to the floor.  She landed on her buttocks.  She did not hit her head or lose consciousness.  She is not complaining of pain from the fall.   Back Pain Fall       Home Medications Prior to Admission medications   Medication Sig Start Date End Date Taking? Authorizing Provider  albuterol (PROVENTIL) (2.5 MG/3ML) 0.083% nebulizer solution Take 3 mLs (2.5 mg total) by nebulization every 2 (two) hours as needed for wheezing or shortness of breath. 01/14/22  Yes Emokpae, Courage, MD  albuterol (VENTOLIN HFA) 108 (90 Base) MCG/ACT inhaler Inhale 2 puffs into the lungs every 6 (six) hours as needed for wheezing or shortness of breath. 01/14/22  Yes Emokpae, Courage, MD  amLODipine (NORVASC) 5 MG tablet Take 1 tablet (5 mg total) by mouth daily. 01/14/22  Yes Emokpae, Courage, MD  atorvastatin (LIPITOR) 20 MG tablet Take 1  tablet (20 mg total) by mouth daily. 01/14/22  Yes Emokpae, Courage, MD  Cholecalciferol (VITAMIN D3) 25 MCG (1000 UT) CAPS Take 1 capsule by mouth daily.   Yes [provider]  folic acid (FOLVITE) 1 MG tablet Take 1 tablet (1 mg total) by mouth daily. 01/14/22  Yes Emokpae, Courage, MD  furosemide (LASIX) 20 MG tablet Take 1 tablet (20 mg total) by mouth 2 (two) times daily. 01/14/22 01/14/23 Yes Emokpae, Courage, MD  ipratropium-albuterol (DUONEB) 0.5-2.5 (3) MG/3ML SOLN Take 3 mLs by nebulization every 4 (four) hours as needed. 01/14/22  Yes Emokpae, Courage, MD  KLOR-CON M20 20 MEQ tablet Take 20 mEq by mouth daily. 01/14/22  Yes [provider]  metFORMIN (GLUCOPHAGE) 500 MG tablet Take 1 tablet (500 mg total) by mouth 2 (two) times daily. 01/14/22  Yes Emokpae, Courage, MD  pantoprazole (PROTONIX) 40 MG tablet Take 1 tablet (40 mg total) by mouth daily. 01/14/22  Yes Emokpae, Courage, MD  rivaroxaban (XARELTO) 20 MG TABS tablet Take 1 tablet (20 mg total) by mouth daily with supper. 01/14/22  Yes Emokpae, Courage, MD  zinc sulfate 220 (50 Zn) MG capsule Take 1 capsule (220 mg total) by mouth daily. 01/15/22  Yes Roxan Hockey, MD  ACCU-CHEK GUIDE test strip SMARTSIG:Via Meter 1-4 Times Daily 01/14/22   [provider]  Accu-Chek Softclix Lancets lancets SMARTSIG:Via  Meter 1-4 Times Daily 01/14/22   [provider]  blood glucose meter kit and supplies KIT Dispense based on patient and insurance preference. Use up to four times daily as directed. 01/14/22   Roxan Hockey, MD  blood glucose meter kit and supplies Dispense based on patient and insurance preference. Use up to four times daily as directed. (FOR ICD-9 250.00, 250.01). 01/14/22   Roxan Hockey, MD  Nebulizers (COMPRESSOR/NEBULIZER) MISC 1 Units by Does not apply route daily as needed. 01/14/22   Roxan Hockey, MD      Allergies    Penicillins    Review of Systems   Review of Systems   Musculoskeletal:  Positive for back pain.  All other systems reviewed and are negative.   Physical Exam Updated Vital Signs BP (!) 122/59 (BP Location: Right Arm)   Pulse 84   Temp 97.8 F (36.6 C) (Oral)   Resp 18   Ht _0  (1.626 m)   Wt 59 kg   SpO2 96%   BMI 22.31 kg/m  Physical Exam Vitals and nursing note reviewed.  Constitutional:      General: She is not in acute distress.    Appearance: She is well-developed.     Comments: Cachectic  HENT:     Head: Normocephalic and atraumatic.  Eyes:     Conjunctiva/sclera: Conjunctivae normal.  Cardiovascular:     Rate and Rhythm: Normal rate and regular rhythm.     Heart sounds: No murmur heard. Pulmonary:     Effort: Pulmonary effort is normal. No respiratory distress.     Breath sounds: Normal breath sounds.  Abdominal:     Palpations: Abdomen is soft.     Tenderness: There is no abdominal tenderness. There is no right CVA tenderness or left CVA tenderness.  Musculoskeletal:        General: No swelling, tenderness or deformity. Normal range of motion.     Cervical back: Neck supple.     Lumbar back: Bony tenderness present. Negative right straight leg raise test and negative left straight leg raise test.  Skin:    General: Skin is warm and dry.     Capillary Refill: Capillary refill takes less than 2 seconds.  Neurological:     Mental Status: She is alert.  Psychiatric:        Mood and Affect: Mood normal.     ED Results / Procedures / Treatments   Labs (all labs ordered are listed, but only abnormal results are displayed) Labs Reviewed  URINALYSIS, ROUTINE W REFLEX MICROSCOPIC - Abnormal; Notable for the following components:      Result Value   APPearance HAZY (*)    Protein, ur 30 (*)    All other components within normal limits    EKG None  Radiology CT Lumbar Spine Wo Contrast  Result Date: 02/23/2022 CLINICAL DATA:  Back pain for 1 week.  Unwitnessed fall EXAM: CT LUMBAR SPINE WITHOUT CONTRAST  TECHNIQUE: Multidetector CT imaging of the lumbar spine was performed without intravenous contrast administration. Multiplanar CT image reconstructions were also generated. RADIATION DOSE REDUCTION: This exam was performed according to the departmental dose-optimization program which includes automated exposure control, adjustment of the mA and/or kV according to patient size and/or use of iterative reconstruction technique. COMPARISON:  X-ray 01/27/2022, CT 11/23/2019.  Ultrasound 09/18/2020 FINDINGS: Segmentation: 5 lumbar type vertebrae. Alignment: Normal. Vertebrae: No acute fracture or focal pathologic process. Paraspinal and other soft tissues: Partially imaged bilateral pleural effusions. Aortic atherosclerosis. Right  renal cyst. The distal indeterminate density bilateral renal lesions are not appreciably changed since 2021 and were better characterized by ultrasound in 2022. No follow-up imaging is required. Advanced aortoiliac atherosclerosis. Disc levels: Mild disc height loss at L2-L3 and L3-L4. Relatively mild lower lumbar facet arthropathy. Relatively mild foraminal narrowing is most notable at the L3-4 level on the left. No evidence of high-grade canal stenosis by CT. IMPRESSION: 1. No acute fracture or traumatic malalignment of the lumbar spine. 2. 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 3. Partially imaged bilateral pleural effusions. 4. Aortic atherosclerosis (ICD10-I70.0). Electronically Signed   By: Davina Poke D.O.   On: 02/23/2022 17:09    Procedures Procedures    Medications Ordered in ED Medications - No data to display  ED Course/ Medical Decision Making/ A&P                             Medical Decision Making Amount and/or Complexity of Data Reviewed Labs: ordered. Radiology: ordered.  This patient presents to the ED for concern of low back pain and urinary frequency, this involves an extensive number of treatment options, and is a complaint that carries  with it a high risk of complications and morbidity.  Emergent considerations in the differential diagnosis of back pain include:occult fracture, congenital anomalies, tumors, vascular catastrophes, osteomyelitis of vertebrae, infections of disc, meninges or cord, space occupying lesions within canal leading to cord or root compression including epidural abscess.   Co morbidities that complicate the patient evaluation  COPD on 4 L via nasal cannula at baseline, chronic respiratory failure, CHF, hypertension, diabetes  My initial workup includes urinalysis, CT lumbar spine  Additional history obtained from: Nursing notes from this visit.  I ordered, reviewed and interpreted labs which include: Urinalysis.  No signs of UTI  I ordered imaging studies including CT lumbar spine I independently visualized and interpreted imaging which showed no acute fractures or dislocations.  She did have partially imaged bilateral pleural effusions but is not complaining of any shortness of breath. I agree with the radiologist interpretation  Afebrile, hemodynamically stable.  82 year old female presents ED for evaluation of diffuse low back pain.  Symptoms began 1 week ago.  Denies injury.  Worse with movement.  Says the symptoms went away with Tylenol for a full day, but came back.  This is the reason for her presentation.  Denies neurologic or urinary symptoms.  Low suspicion for cauda equina.  Patient did ambulate in the ED to the bathroom without difficulty. neurovascular status intact.  She reported her pain to improve with rest.  She will be discharged with primary care follow-up and encouraged to take Tylenol for her pain.  She was given education on appropriate dosing of Tylenol.  She was given strict return precautions.  Stable at discharge.  At this time there does not appear to be any evidence of an acute emergency medical condition and the patient appears stable for discharge with appropriate  outpatient follow up. Diagnosis was discussed with patient who verbalizes understanding of care plan and is agreeable to discharge. I have discussed return precautions with patient who verbalizes understanding. Patient encouraged to follow-up with their PCP within 1 week. All questions answered.  Patient's case discussed with Dr. Sabra Heck who agrees with plan to discharge with follow-up.   Note: Portions of this report may have been transcribed using voice recognition software. Every effort was made to ensure accuracy; however, inadvertent computerized  transcription errors may still be present.         Final Clinical Impression(s) / ED Diagnoses Final diagnoses:  None    Rx / DC Orders ED Discharge Orders     None         Nehemiah Massed 02/23/22 2213    Hayden Rasmussen, MD 02/18/2022 [phone]

## 2022-02-23 NOTE — Discharge Instructions (Signed)
You have been seen today for your complaint of low back pain. Your lab work was reassuring and showed no signs of a urinary tract infection. Your imaging showed no new fractures or dislocations. Your discharge medications include tylenol for pain. You may take up to 1000 mg of tylenol. Follow up with: your primary care provider for reevaluation Please seek immediate medical care if you develop any of the following symptoms: You develop new bowel or bladder control problems. You have unusual weakness or numbness in your arms or legs. You feel faint. At this time there does not appear to be the presence of an emergent medical condition, however there is always the potential for conditions to change. Please read and follow the below instructions.  Do not take your medicine if  develop an itchy rash, swelling in your mouth or lips, or difficulty breathing; call 911 and seek immediate emergency medical attention if this occurs.  You may review your lab tests and imaging results in their entirety on your MyChart account.  Please discuss all results of fully with your primary care provider and other specialist at your follow-up visit.  Note: Portions of this text may have been transcribed using voice recognition software. Every effort was made to ensure accuracy; however, inadvertent computerized transcription errors may still be present.

## 2022-02-23 NOTE — ED Notes (Signed)
EDP Dr Melina Copa made aware of fall incident.

## 2022-02-23 NOTE — ED Triage Notes (Signed)
Pt to ED c/o back pain x 1 week, when pt was checking into hospital, she had an unwitnessed fall , pt was attempting to get into the wheelchair, and fell on bottom, denies hitting head, no obvious injuries noted. This RN and NT to assist pt and wheelchair to treatment room.

## 2022-02-24 ENCOUNTER — Other Ambulatory Visit: Payer: Self-pay

## 2022-02-24 ENCOUNTER — Emergency Department (HOSPITAL_COMMUNITY): Payer: 59

## 2022-02-24 ENCOUNTER — Inpatient Hospital Stay (HOSPITAL_COMMUNITY)
Admission: EM | Admit: 2022-02-24 | Discharge: 2022-03-14 | DRG: 189 | Disposition: E | Payer: 59 | Attending: Internal Medicine | Admitting: Internal Medicine

## 2022-02-24 DIAGNOSIS — E119 Type 2 diabetes mellitus without complications: Secondary | ICD-10-CM | POA: Diagnosis present

## 2022-02-24 DIAGNOSIS — J9819 Other pulmonary collapse: Secondary | ICD-10-CM | POA: Diagnosis present

## 2022-02-24 DIAGNOSIS — I2699 Other pulmonary embolism without acute cor pulmonale: Secondary | ICD-10-CM | POA: Diagnosis present

## 2022-02-24 DIAGNOSIS — W1830XA Fall on same level, unspecified, initial encounter: Secondary | ICD-10-CM | POA: Diagnosis not present

## 2022-02-24 DIAGNOSIS — Z961 Presence of intraocular lens: Secondary | ICD-10-CM | POA: Diagnosis present

## 2022-02-24 DIAGNOSIS — F17210 Nicotine dependence, cigarettes, uncomplicated: Secondary | ICD-10-CM | POA: Diagnosis present

## 2022-02-24 DIAGNOSIS — Z9842 Cataract extraction status, left eye: Secondary | ICD-10-CM

## 2022-02-24 DIAGNOSIS — I5033 Acute on chronic diastolic (congestive) heart failure: Secondary | ICD-10-CM | POA: Diagnosis present

## 2022-02-24 DIAGNOSIS — Y929 Unspecified place or not applicable: Secondary | ICD-10-CM

## 2022-02-24 DIAGNOSIS — Z79899 Other long term (current) drug therapy: Secondary | ICD-10-CM

## 2022-02-24 DIAGNOSIS — Z1152 Encounter for screening for COVID-19: Secondary | ICD-10-CM

## 2022-02-24 DIAGNOSIS — I110 Hypertensive heart disease with heart failure: Secondary | ICD-10-CM | POA: Diagnosis present

## 2022-02-24 DIAGNOSIS — Z9981 Dependence on supplemental oxygen: Secondary | ICD-10-CM

## 2022-02-24 DIAGNOSIS — I10 Essential (primary) hypertension: Secondary | ICD-10-CM | POA: Diagnosis present

## 2022-02-24 DIAGNOSIS — K922 Gastrointestinal hemorrhage, unspecified: Secondary | ICD-10-CM | POA: Diagnosis present

## 2022-02-24 DIAGNOSIS — H5461 Unqualified visual loss, right eye, normal vision left eye: Secondary | ICD-10-CM | POA: Diagnosis present

## 2022-02-24 DIAGNOSIS — I2723 Pulmonary hypertension due to lung diseases and hypoxia: Secondary | ICD-10-CM | POA: Diagnosis present

## 2022-02-24 DIAGNOSIS — J9601 Acute respiratory failure with hypoxia: Secondary | ICD-10-CM | POA: Diagnosis present

## 2022-02-24 DIAGNOSIS — H5440 Blindness, one eye, unspecified eye: Secondary | ICD-10-CM | POA: Diagnosis present

## 2022-02-24 DIAGNOSIS — E785 Hyperlipidemia, unspecified: Secondary | ICD-10-CM | POA: Diagnosis present

## 2022-02-24 DIAGNOSIS — I5031 Acute diastolic (congestive) heart failure: Secondary | ICD-10-CM | POA: Diagnosis not present

## 2022-02-24 DIAGNOSIS — J449 Chronic obstructive pulmonary disease, unspecified: Secondary | ICD-10-CM | POA: Diagnosis present

## 2022-02-24 DIAGNOSIS — Z66 Do not resuscitate: Secondary | ICD-10-CM | POA: Diagnosis present

## 2022-02-24 DIAGNOSIS — Z7901 Long term (current) use of anticoagulants: Secondary | ICD-10-CM

## 2022-02-24 DIAGNOSIS — D649 Anemia, unspecified: Secondary | ICD-10-CM | POA: Diagnosis present

## 2022-02-24 DIAGNOSIS — E875 Hyperkalemia: Secondary | ICD-10-CM | POA: Diagnosis present

## 2022-02-24 DIAGNOSIS — Z880 Allergy status to penicillin: Secondary | ICD-10-CM

## 2022-02-24 DIAGNOSIS — M545 Low back pain, unspecified: Secondary | ICD-10-CM | POA: Diagnosis present

## 2022-02-24 DIAGNOSIS — I2782 Chronic pulmonary embolism: Secondary | ICD-10-CM | POA: Diagnosis present

## 2022-02-24 DIAGNOSIS — Z515 Encounter for palliative care: Secondary | ICD-10-CM

## 2022-02-24 DIAGNOSIS — Z7984 Long term (current) use of oral hypoglycemic drugs: Secondary | ICD-10-CM

## 2022-02-24 DIAGNOSIS — J9621 Acute and chronic respiratory failure with hypoxia: Principal | ICD-10-CM | POA: Diagnosis present

## 2022-02-24 DIAGNOSIS — Z833 Family history of diabetes mellitus: Secondary | ICD-10-CM

## 2022-02-24 DIAGNOSIS — R0602 Shortness of breath: Principal | ICD-10-CM

## 2022-02-24 HISTORY — DX: Pleural effusion, not elsewhere classified: J90

## 2022-02-24 HISTORY — DX: Nicotine dependence, unspecified, uncomplicated: F17.200

## 2022-02-24 HISTORY — DX: Dependence on supplemental oxygen: Z99.81

## 2022-02-24 HISTORY — DX: Type 2 diabetes mellitus without complications: E11.9

## 2022-02-24 HISTORY — DX: Chronic diastolic (congestive) heart failure: I50.32

## 2022-02-24 HISTORY — DX: Vitamin D deficiency, unspecified: E55.9

## 2022-02-24 HISTORY — DX: Anemia, unspecified: D64.9

## 2022-02-24 HISTORY — DX: Hyperlipidemia, unspecified: E78.5

## 2022-02-24 HISTORY — DX: Patient's other noncompliance with medication regimen for other reason: Z91.148

## 2022-02-24 HISTORY — DX: Repeated falls: R29.6

## 2022-02-24 HISTORY — DX: Unspecified cataract: H26.9

## 2022-02-24 HISTORY — DX: Pulmonary hypertension, unspecified: I27.20

## 2022-02-24 LAB — CBC
HCT: 34.4 % — ABNORMAL LOW (ref 36.0–46.0)
Hemoglobin: 9.8 g/dL — ABNORMAL LOW (ref 12.0–15.0)
MCH: 21.6 pg — ABNORMAL LOW (ref 26.0–34.0)
MCHC: 28.5 g/dL — ABNORMAL LOW (ref 30.0–36.0)
MCV: 75.9 fL — ABNORMAL LOW (ref 80.0–100.0)
Platelets: 382 10*3/uL (ref 150–400)
RBC: 4.53 MIL/uL (ref 3.87–5.11)
RDW: 23.7 % — ABNORMAL HIGH (ref 11.5–15.5)
WBC: 6 10*3/uL (ref 4.0–10.5)
nRBC: 1.5 % — ABNORMAL HIGH (ref 0.0–0.2)

## 2022-02-24 LAB — RESP PANEL BY RT-PCR (RSV, FLU A&B, COVID)  RVPGX2
Influenza A by PCR: NEGATIVE
Influenza B by PCR: NEGATIVE
Resp Syncytial Virus by PCR: NEGATIVE
SARS Coronavirus 2 by RT PCR: NEGATIVE

## 2022-02-24 LAB — COMPREHENSIVE METABOLIC PANEL
ALT: 11 U/L (ref 0–44)
AST: 16 U/L (ref 15–41)
Albumin: 3.2 g/dL — ABNORMAL LOW (ref 3.5–5.0)
Alkaline Phosphatase: 92 U/L (ref 38–126)
Anion gap: 9 (ref 5–15)
BUN: 44 mg/dL — ABNORMAL HIGH (ref 8–23)
CO2: 22 mmol/L (ref 22–32)
Calcium: 8.5 mg/dL — ABNORMAL LOW (ref 8.9–10.3)
Chloride: 104 mmol/L (ref 98–111)
Creatinine, Ser: 1.61 mg/dL — ABNORMAL HIGH (ref 0.44–1.00)
GFR, Estimated: 32 mL/min — ABNORMAL LOW (ref >60)
Glucose, Bld: 118 mg/dL — ABNORMAL HIGH (ref 70–99)
Potassium: 6 mmol/L — ABNORMAL HIGH (ref 3.5–5.1)
Sodium: 135 mmol/L (ref 135–145)
Total Bilirubin: 0.5 mg/dL (ref 0.3–1.2)
Total Protein: 7.5 g/dL (ref 6.5–8.1)

## 2022-02-24 LAB — BRAIN NATRIURETIC PEPTIDE: B Natriuretic Peptide: 497 pg/mL — ABNORMAL HIGH (ref 0.0–100.0)

## 2022-02-24 LAB — POTASSIUM: Potassium: 6 mmol/L — ABNORMAL HIGH (ref 3.5–5.1)

## 2022-02-24 LAB — TROPONIN I (HIGH SENSITIVITY)
Troponin I (High Sensitivity): 35 ng/L — ABNORMAL HIGH (ref <18)
Troponin I (High Sensitivity): 40 ng/L — ABNORMAL HIGH (ref <18)

## 2022-02-24 MED ORDER — FUROSEMIDE 10 MG/ML IJ SOLN
20.0000 mg | Freq: Once | INTRAMUSCULAR | Status: AC
  Administered 2022-02-24: 20 mg via INTRAVENOUS
  Filled 2022-02-24: qty 2

## 2022-02-24 MED ORDER — ALBUTEROL SULFATE HFA 108 (90 BASE) MCG/ACT IN AERS: 2.0000 | INHALATION_SPRAY | RESPIRATORY_TRACT | Status: DC | PRN

## 2022-02-24 MED ORDER — PANTOPRAZOLE SODIUM 40 MG PO TBEC
40.0000 mg | DELAYED_RELEASE_TABLET | Freq: Every day | ORAL | Status: DC
  Administered 2022-02-26: 40 mg via ORAL
  Filled 2022-02-24: qty 1

## 2022-02-24 MED ORDER — ONDANSETRON HCL 4 MG/2ML IJ SOLN: 4.0000 mg | Freq: Four times a day (QID) | INTRAMUSCULAR | Status: DC | PRN

## 2022-02-24 MED ORDER — ACETAMINOPHEN 325 MG PO TABS: 650.0000 mg | ORAL_TABLET | Freq: Four times a day (QID) | ORAL | Status: DC | PRN

## 2022-02-24 MED ORDER — ALBUTEROL SULFATE (2.5 MG/3ML) 0.083% IN NEBU
2.5000 mg | INHALATION_SOLUTION | RESPIRATORY_TRACT | Status: DC | PRN
  Administered 2022-02-24 – 2022-02-26 (×3): 2.5 mg via RESPIRATORY_TRACT
  Filled 2022-02-24 (×3): qty 3

## 2022-02-24 MED ORDER — FOLIC ACID 1 MG PO TABS
1.0000 mg | ORAL_TABLET | Freq: Every day | ORAL | Status: DC
  Administered 2022-02-24 – 2022-02-26 (×2): 1 mg via ORAL
  Filled 2022-02-24 (×2): qty 1

## 2022-02-24 MED ORDER — ATORVASTATIN CALCIUM 20 MG PO TABS
20.0000 mg | ORAL_TABLET | Freq: Every day | ORAL | Status: DC
  Administered 2022-02-24 – 2022-02-26 (×2): 20 mg via ORAL
  Filled 2022-02-24: qty 1
  Filled 2022-02-24: qty 2

## 2022-02-24 MED ORDER — SODIUM CHLORIDE 0.9% FLUSH
3.0000 mL | Freq: Two times a day (BID) | INTRAVENOUS | Status: DC
  Administered 2022-02-25 – 2022-02-26 (×3): 3 mL via INTRAVENOUS

## 2022-02-24 MED ORDER — ONDANSETRON HCL 4 MG PO TABS: 4.0000 mg | ORAL_TABLET | Freq: Four times a day (QID) | ORAL | Status: DC | PRN

## 2022-02-24 MED ORDER — ZINC SULFATE 220 (50 ZN) MG PO CAPS
220.0000 mg | ORAL_CAPSULE | Freq: Every day | ORAL | Status: DC
  Administered 2022-02-24 – 2022-02-26 (×2): 220 mg via ORAL
  Filled 2022-02-24 (×2): qty 1

## 2022-02-24 MED ORDER — RIVAROXABAN 20 MG PO TABS
20.0000 mg | ORAL_TABLET | Freq: Every day | ORAL | Status: DC
  Administered 2022-02-24 – 2022-02-25 (×2): 20 mg via ORAL
  Filled 2022-02-24 (×2): qty 1

## 2022-02-24 MED ORDER — SODIUM ZIRCONIUM CYCLOSILICATE 5 G PO PACK
10.0000 g | PACK | Freq: Once | ORAL | Status: AC
  Administered 2022-02-24: 10 g via ORAL
  Filled 2022-02-24: qty 2

## 2022-02-24 MED ORDER — IPRATROPIUM-ALBUTEROL 0.5-2.5 (3) MG/3ML IN SOLN
3.0000 mL | Freq: Once | RESPIRATORY_TRACT | Status: AC
  Administered 2022-02-24: 3 mL via RESPIRATORY_TRACT
  Filled 2022-02-24: qty 3

## 2022-02-24 MED ORDER — SODIUM CHLORIDE 0.9% FLUSH: 3.0000 mL | INTRAVENOUS | Status: DC | PRN

## 2022-02-24 MED ORDER — SODIUM CHLORIDE 0.9 % IV SOLN: 250.0000 mL | INTRAVENOUS | Status: DC | PRN

## 2022-02-24 MED ORDER — ACETAMINOPHEN 650 MG RE SUPP: 650.0000 mg | Freq: Four times a day (QID) | RECTAL | Status: DC | PRN

## 2022-02-24 NOTE — Progress Notes (Signed)
Last BM reported by patient was prior to arrival to our floor. 03/03/2022

## 2022-02-24 NOTE — ED Notes (Signed)
Came into pt room and pt had nasal cannula off. Nurse asked pt why it was off she said " I dont know'. Nurse educated pt on importance of leaving her nasal cannula on.

## 2022-02-24 NOTE — H&P (Signed)
History and Physical    Marisa Bailey:[id number] DOB: January 12, 1941 DOA: 03/08/2022  PCP: [name], MD   Patient coming from: Home  Chief Complaint: Shortness of breath  HPI: Marisa Bailey is a 82 y.o. 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  She was seen in the ED just yesterday for complaints of back pain but did not appear to have any issues with shortness of breath at that time.  Niece called EMS this morning for shortness of breath with initial sats around 90% which improved to 97% after further supplementation from EMS.  She denies any chest pain and appears to have a mild cough that is nonproductive.  No fevers or chills noted.  She states that she is compliant on her home Lasix and weighs herself daily, but this is uncertain.  She cannot tell me her baseline weight.   ED Course: Vital signs stable and patient afebrile.  She is currently on 4 L nasal cannula oxygen.  She was given DuoNeb and Lokelma in the ED and I had asked EDP to provide Lasix.  She does not appear to be any significant respiratory distress.  BNP at 497 and chest x-ray with mild right-sided pleural effusion noted.  Review of Systems: Reviewed as noted above, otherwise negative.  Past Medical History:  Diagnosis Date   Aspiration pneumonia ([place]) 11/03/2020   Blind right eye    CHF (congestive heart failure) (HCC)    COPD (chronic obstructive pulmonary disease) (HCC)    Diabetes mellitus without complication ([place])    Fx upper humerus-closed 08/17/2012   H/O blood clots    lungs   Hypertension     Past Surgical History:  Procedure Laterality Date   CATARACT EXTRACTION W/PHACO Left 09/24/2021   Procedure: CATARACT EXTRACTION PHACO AND INTRAOCULAR  LENS PLACEMENT ([place]);  Surgeon: Baruch Goldmann, MD;  Location: [facility];  Service: Ophthalmology;  Laterality: Left;  CDE: 8.08   ENUCLEATION     ESOPHAGOGASTRODUODENOSCOPY (EGD) WITH PROPOFOL N/A 10/25/2021   Procedure: ESOPHAGOGASTRODUODENOSCOPY (EGD) WITH PROPOFOL;  Surgeon: Harvel Quale, MD;  Location: [facility];  Service: Gastroenterology;  Laterality: N/A;   HEMOSTASIS CLIP PLACEMENT  10/25/2021   Procedure: HEMOSTASIS CLIP PLACEMENT;  Surgeon: Harvel Quale, MD;  Location: [facility];  Service: Gastroenterology;;   HOT HEMOSTASIS  10/25/2021   Procedure: HOT HEMOSTASIS (ARGON PLASMA COAGULATION/BICAP);  Surgeon: Montez Morita, Quillian Quince, MD;  Location: [facility];  Service: Gastroenterology;;   SUBMUCOSAL INJECTION  10/25/2021   Procedure: SUBMUCOSAL INJECTION;  Surgeon: Harvel Quale, MD;  Location: [facility];  Service: Gastroenterology;;     reports that she has been smoking cigarettes. She has a 18.00 pack-year smoking history. She has never used smokeless tobacco. She reports that she does not currently use alcohol. She reports that she does not use drugs.  Allergies  Allergen Reactions   Penicillins Rash    Has patient had a PCN reaction causing immediate rash, facial/tongue/throat swelling, SOB or lightheadedness with hypotension: {Yes/No:[id number] Has patient had a PCN reaction causing severe rash involving mucus membranes or skin necrosis: Yes Has patient had a PCN reaction that required hospitalization No Has patient had a PCN reaction occurring within the last 10 years: No If  all of the above answers are "NO", then may proceed with Cephalosporin use.  **Has tolerated keflex     Family History  Problem Relation Age of Onset   Diabetes Mother     Prior to Admission medications   Medication Sig Start Date End Date Taking? Authorizing Provider  ACCU-CHEK GUIDE test strip SMARTSIG:Via Meter 1-4 Times Daily 01/14/22   [provider]  Accu-Chek Softclix Lancets lancets SMARTSIG:Via Meter 1-4 Times Daily 01/14/22   [provider]  albuterol (PROVENTIL) (2.5 MG/3ML) 0.083% nebulizer solution Take 3 mLs (2.5 mg total) by nebulization every 2 (two) hours as needed for wheezing or shortness of breath. 01/14/22   Roxan Hockey, MD  albuterol (VENTOLIN HFA) 108 (90 Base) MCG/ACT inhaler Inhale 2 puffs into the lungs every 6 (six) hours as needed for wheezing or shortness of breath. 01/14/22   Roxan Hockey, MD  amLODipine (NORVASC) 5 MG tablet Take 1 tablet (5 mg total) by mouth daily. 01/14/22   Roxan Hockey, MD  atorvastatin (LIPITOR) 20 MG tablet Take 1 tablet (20 mg total) by mouth daily. 01/14/22   Roxan Hockey, MD  blood glucose meter kit and supplies KIT Dispense based on patient and insurance preference. Use up to four times daily as directed. 01/14/22   Roxan Hockey, MD  blood glucose meter kit and supplies Dispense based on patient and insurance preference. Use up to four times daily as directed. (FOR ICD-9 250.00, 250.01). 01/14/22   Roxan Hockey, MD  Cholecalciferol (VITAMIN D3) 25 MCG (1000 UT) CAPS Take 1 capsule by mouth daily.    [provider]  folic acid (FOLVITE) 1 MG tablet Take 1 tablet (1 mg total) by mouth daily. 01/14/22   Roxan Hockey, MD  furosemide (LASIX) 20 MG tablet Take 1 tablet (20 mg total) by mouth 2 (two) times daily. 01/14/22 01/14/23  Roxan Hockey, MD  ipratropium-albuterol (DUONEB) 0.5-2.5 (3) MG/3ML SOLN Take 3 mLs by nebulization every 4 (four) hours as needed. 01/14/22   Emokpae, Courage, MD  KLOR-CON M20 20 MEQ tablet Take 20 mEq by mouth daily. 01/14/22   [provider]  metFORMIN (GLUCOPHAGE) 500 MG tablet Take 1 tablet (500 mg total) by mouth 2 (two) times daily. 01/14/22   Roxan Hockey, MD  Nebulizers (COMPRESSOR/NEBULIZER) MISC 1 Units by Does not apply route daily as needed. 01/14/22   Roxan Hockey, MD  pantoprazole  (PROTONIX) 40 MG tablet Take 1 tablet (40 mg total) by mouth daily. 01/14/22   Roxan Hockey, MD  rivaroxaban (XARELTO) 20 MG TABS tablet Take 1 tablet (20 mg total) by mouth daily with supper. 01/14/22   Roxan Hockey, MD  zinc sulfate 220 (50 Zn) MG capsule Take 1 capsule (220 mg total) by mouth daily. 01/15/22   Roxan Hockey, MD    Physical Exam: Vitals:   02/15/2022 1215 02/11/2022 1225 02/22/2022 1300 03/06/2022 1315  BP:   (!) 116/47 (!) 118/56  Pulse:  81 79 79  Resp:  20 (!) 24 (!) 25  Temp:      TempSrc:      SpO2: 91% 94% 95% 97%  Weight:      Height:        Constitutional: NAD, calm, comfortable Vitals:   02/13/2022 1215 03/08/2022 1225 02/14/2022 1300 02/26/2022 1315  BP:   (!) 116/47 (!) 118/56  Pulse:  81 79 79  Resp:  20 (!) 24 (!) 25  Temp:      TempSrc:      SpO2: 91%  94% 95% 97%  Weight:      Height:       Eyes: lids and conjunctivae normal Neck: normal, supple Respiratory: clear to auscultation bilaterally. Normal respiratory effort. No accessory muscle use.  4 L nasal cannula Cardiovascular: Regular rate and rhythm, no murmurs. Abdomen: no tenderness, no distention. Bowel sounds positive.  Musculoskeletal:  No edema. Skin: no rashes, lesions, ulcers.  Psychiatric: Flat affect  Labs on Admission: I have personally reviewed following labs and imaging studies  CBC: Recent Labs  Lab 03/05/2022 0948  WBC 6.0  HGB 9.8*  HCT 34.4*  MCV 75.9*  PLT 220   Basic Metabolic Panel: Recent Labs  Lab 02/23/2022 0948 03/10/2022 1105  NA 135  --   K 6.0* 6.0*  CL 104  --   CO2 22  --   GLUCOSE 118*  --   BUN 44*  --   CREATININE 1.61*  --   CALCIUM 8.5*  --    GFR: Estimated Creatinine Clearance: 23.7 mL/min (A) (by C-G formula based on SCr of 1.61 mg/dL (H)). Liver Function Tests: Recent Labs  Lab 02/19/2022 0948  AST 16  ALT 11  ALKPHOS 92  BILITOT 0.5  PROT 7.5  ALBUMIN 3.2*   No results for input(s): "LIPASE", "AMYLASE" in the last 168 hours. No  results for input(s): "AMMONIA" in the last 168 hours. Coagulation Profile: No results for input(s): "INR", "PROTIME" in the last 168 hours. Cardiac Enzymes: No results for input(s): "CKTOTAL", "CKMB", "CKMBINDEX", "TROPONINI" in the last 168 hours. BNP (last 3 results) No results for input(s): "PROBNP" in the last 8760 hours. HbA1C: No results for input(s): "HGBA1C" in the last 72 hours. CBG: No results for input(s): "GLUCAP" in the last 168 hours. Lipid Profile: No results for input(s): "CHOL", "HDL", "LDLCALC", "TRIG", "CHOLHDL", "LDLDIRECT" in the last 72 hours. Thyroid Function Tests: No results for input(s): "TSH", "T4TOTAL", "FREET4", "T3FREE", "THYROIDAB" in the last 72 hours. Anemia Panel: No results for input(s): "VITAMINB12", "FOLATE", "FERRITIN", "TIBC", "IRON", "RETICCTPCT" in the last 72 hours. Urine analysis:    Component Value Date/Time   COLORURINE YELLOW 02/23/2022 1640   APPEARANCEUR HAZY (A) 02/23/2022 1640   LABSPEC 1.016 02/23/2022 1640   PHURINE 5.0 02/23/2022 1640   GLUCOSEU NEGATIVE 02/23/2022 1640   HGBUR NEGATIVE 02/23/2022 1640   BILIRUBINUR NEGATIVE 02/23/2022 1640   KETONESUR NEGATIVE 02/23/2022 1640   PROTEINUR 30 (A) 02/23/2022 1640   NITRITE NEGATIVE 02/23/2022 1640   LEUKOCYTESUR NEGATIVE 02/23/2022 1640    Radiological Exams on Admission: DG Chest Port 1 View  Result Date: 03/05/2022 CLINICAL DATA:  Shortness of breath EXAM: PORTABLE CHEST 1 VIEW COMPARISON:  01/27/2022 FINDINGS: Cardiomegaly. Aortic atherosclerosis. Chronic elevation of the right hemidiaphragm. Bibasilar atelectasis and consolidation. Small right pleural effusion. No pneumothorax. IMPRESSION: Bibasilar atelectasis and consolidation with small right pleural effusion. Electronically Signed   By: Davina Poke D.O.   On: 03/13/2022 09:57   CT Lumbar Spine Wo Contrast  Result Date: 02/23/2022 CLINICAL DATA:  Back pain for 1 week.  Unwitnessed fall EXAM: CT LUMBAR SPINE  WITHOUT CONTRAST TECHNIQUE: Multidetector CT imaging of the lumbar spine was performed without intravenous contrast administration. Multiplanar CT image reconstructions were also generated. RADIATION DOSE REDUCTION: This exam was performed according to the departmental dose-optimization program which includes automated exposure control, adjustment of the mA and/or kV according to patient size and/or use of iterative reconstruction technique. COMPARISON:  X-ray 01/27/2022, CT 11/23/2019.  Ultrasound 09/18/2020 FINDINGS: Segmentation: 5 lumbar type vertebrae.  Alignment: Normal. Vertebrae: No acute fracture or focal pathologic process. Paraspinal and other soft tissues: Partially imaged bilateral pleural effusions. Aortic atherosclerosis. Right renal cyst. The distal indeterminate density bilateral renal lesions are not appreciably changed since 2021 and were better characterized by ultrasound in 2022. No follow-up imaging is required. Advanced aortoiliac atherosclerosis. Disc levels: Mild disc height loss at L2-L3 and L3-L4. Relatively mild lower lumbar facet arthropathy. Relatively mild foraminal narrowing is most notable at the L3-4 level on the left. No evidence of high-grade canal stenosis by CT. IMPRESSION: 1. No acute fracture or traumatic malalignment of the lumbar spine. 2. 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 3. Partially imaged bilateral pleural effusions. 4. Aortic atherosclerosis (ICD10-I70.0). Electronically Signed   By: Davina Poke D.O.   On: 02/23/2022 17:09    EKG: Independently reviewed.  Sinus rhythm 73 bpm.  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)   Symptomatic anemia   Essential hypertension   Blindness of right eye   GI bleed    Acute diastolic CHF exacerbation Appears to be  mild without any significant hypoxemia BNP 497 and chest x-ray with mild right-sided pleural effusion noted Prior 2D echocardiogram 08/2021 with EF 55-60% and grade 2 diastolic dysfunction with moderate pulmonary hypertension Initiate IV diuresis 20 mg twice daily Monitor strict I's and O's Daily weights  Hyperkalemia Should improve with diuresis Lokelma given in the ED Monitor on telemetry  History of COPD with chronic hypoxemia Wears 4 L nasal cannula at home, currently on 4 L with no acute hypoxemia noted No acute bronchospasms noted DuoNebs as needed for shortness of breath or wheezing  Chronic anemia Prior history of GI bleed Continue Protonix No overt bleeding noted Continue to monitor  History of prior PE Continue Xarelto  Hypertension Hold home amlodipine given softer blood pressure readings while on aggressive IV diuresis  Prior tobacco abuse Denies current tobacco abuse   DVT prophylaxis: Xarelto Code Status: Full Family Communication: None at bedside Disposition Plan: Admit for heart failure and hypoxemia Consults called: None Admission status: Observation, telemetry  Severity of Illness: The appropriate patient status for this patient is OBSERVATION. Observation status is judged to be reasonable and necessary in order to provide the required intensity of service to ensure the patient's safety. The patient's presenting symptoms, physical exam findings, and initial radiographic and laboratory data in the context of their medical condition is felt to place them at decreased risk for further clinical deterioration. Furthermore, it is anticipated that the patient will be medically stable for discharge from the hospital within 2 midnights of admission.    Krishon Adkison D Manuella Ghazi DO [facility]  If 7PM-7AM, please contact night-coverage [email or web address]  03/06/2022, 2:00 PM

## 2022-02-24 NOTE — ED Provider Notes (Signed)
[facility] EMERGENCY DEPARTMENT Provider Note   CSN: [id number] Arrival date & time: 02/23/2022  0907     History  Chief Complaint  Patient presents with   Shortness of Breath   HPI Marisa Bailey is a 82 y.o. 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  Shortness of breath started about a week ago.  Nothing makes it worse or better it is all the time.  She has had some intermittent chest pain. Not currently having chest pain at this time.  Niece called EMS this morning for her shortness of breath.  Initial sats were 90%.  Placed on nonrebreather and sats improved to 97%. Denies bleeding.   Shortness of Breath      Home Medications Prior to Admission medications   Medication Sig Start Date End Date Taking? Authorizing Provider  pantoprazole (PROTONIX) 40 MG tablet Take 1 tablet (40 mg total) by mouth daily. 01/14/22  Yes Emokpae, Courage, MD  rivaroxaban (XARELTO) 20 MG TABS tablet Take 1 tablet (20 mg total) by mouth daily with supper. 01/14/22  Yes Roxan Hockey, MD  ACCU-CHEK GUIDE test strip SMARTSIG:Via Meter 1-4 Times Daily 01/14/22   [provider]  Accu-Chek Softclix Lancets lancets SMARTSIG:Via Meter 1-4 Times Daily 01/14/22   [provider]  albuterol (PROVENTIL) (2.5 MG/3ML) 0.083% nebulizer solution Take 3 mLs (2.5 mg total) by nebulization every 2 (two) hours as needed for wheezing or shortness of breath. 01/14/22   Roxan Hockey, MD  albuterol (VENTOLIN HFA) 108 (90 Base) MCG/ACT inhaler Inhale 2 puffs into the lungs every 6 (six) hours as needed for wheezing or shortness of breath. 01/14/22   Roxan Hockey, MD  amLODipine (NORVASC) 5 MG tablet Take 1 tablet (5 mg total) by mouth daily. 01/14/22   Roxan Hockey, MD  atorvastatin (LIPITOR) 20 MG tablet Take 1 tablet (20 mg total) by mouth daily. 01/14/22   Roxan Hockey, MD  blood glucose meter kit and supplies KIT  Dispense based on patient and insurance preference. Use up to four times daily as directed. 01/14/22   Roxan Hockey, MD  blood glucose meter kit and supplies Dispense based on patient and insurance preference. Use up to four times daily as directed. (FOR ICD-9 250.00, 250.01). 01/14/22   Roxan Hockey, MD  Cholecalciferol (VITAMIN D3) 25 MCG (1000 UT) CAPS Take 1 capsule by mouth daily.    [provider]  folic acid (FOLVITE) 1 MG tablet Take 1 tablet (1 mg total) by mouth daily. 01/14/22   Roxan Hockey, MD  furosemide (LASIX) 20 MG tablet Take 1 tablet (20 mg total) by mouth 2 (two) times daily. 01/14/22 01/14/23  Roxan Hockey, MD  ipratropium-albuterol (DUONEB) 0.5-2.5 (3) MG/3ML SOLN Take 3 mLs by nebulization every 4 (four) hours as needed. 01/14/22   Emokpae, Courage, MD  KLOR-CON M20 20 MEQ tablet Take 20 mEq by mouth daily. 01/14/22   [provider]  metFORMIN (GLUCOPHAGE) 500 MG tablet Take 1 tablet (500 mg total) by mouth 2 (two) times daily. 01/14/22   Roxan Hockey, MD  Nebulizers (COMPRESSOR/NEBULIZER) MISC 1 Units by Does not apply route daily as needed. 01/14/22   Roxan Hockey, MD  zinc sulfate 220 (50 Zn) MG capsule Take 1 capsule (220 mg total) by mouth daily. 01/15/22   Roxan Hockey, MD      Allergies    Penicillins    Review of Systems   Review  of Systems  Respiratory:  Positive for shortness of breath.     Physical Exam   Vitals:   02/25/22 1700 02/25/22 1800  BP: (!) 156/73 (!) 125/49  Pulse: 76 82  Resp: 17 (!) 22  Temp: 97.7 F (36.5 C)   SpO2: 100% 99%    CONSTITUTIONAL:  well-appearing, NAD NEURO:  Alert and oriented x 3, CN 3-12 grossly intact EYES:  eyes equal and reactive ENT/NECK:  Supple, no stridor  CARDIO:  regular rate and rhythm, appears well-perfused  PULM:  No respiratory distress, CTAB, on 5L [place] MSK/SPINE:  No gross deformities, no edema, moves all extremities  SKIN:  no rash, atraumatic   *Additional  and/or pertinent findings included in MDM below    ED Results / Procedures / Treatments   Labs (all labs ordered are listed, but only abnormal results are displayed) Labs Reviewed  COMPREHENSIVE METABOLIC PANEL - Abnormal; Notable for the following components:      Result Value   Potassium 6.0 (*)    Glucose, Bld 118 (*)    BUN 44 (*)    Creatinine, Ser 1.61 (*)    Calcium 8.5 (*)    Albumin 3.2 (*)    GFR, Estimated 32 (*)    All other components within normal limits  CBC - Abnormal; Notable for the following components:   Hemoglobin 9.8 (*)    HCT 34.4 (*)    MCV 75.9 (*)    MCH 21.6 (*)    MCHC 28.5 (*)    RDW 23.7 (*)    nRBC 1.5 (*)    All other components within normal limits  POTASSIUM - Abnormal; Notable for the following components:   Potassium 6.0 (*)    All other components within normal limits  BRAIN NATRIURETIC PEPTIDE - Abnormal; Notable for the following components:   B Natriuretic Peptide 497.0 (*)    All other components within normal limits  BASIC METABOLIC PANEL - Abnormal; Notable for the following components:   BUN 38 (*)    Creatinine, Ser 1.29 (*)    Calcium 8.1 (*)    GFR, Estimated 42 (*)    All other components within normal limits  CBC - Abnormal; Notable for the following components:   Hemoglobin 8.6 (*)    HCT 31.0 (*)    MCV 76.2 (*)    MCH 21.1 (*)    MCHC 27.7 (*)    RDW 23.5 (*)    nRBC 0.7 (*)    All other components within normal limits  BLOOD GAS, ARTERIAL - Abnormal; Notable for the following components:   pH, Arterial 7.3 (*)    pCO2 arterial 60 (*)    pO2, Arterial 118 (*)    Bicarbonate 29.5 (*)    All other components within normal limits  BRAIN NATRIURETIC PEPTIDE - Abnormal; Notable for the following components:   B Natriuretic Peptide 507.0 (*)    All other components within normal limits  TROPONIN I (HIGH SENSITIVITY) - Abnormal; Notable for the following components:   Troponin I (High Sensitivity) 35 (*)    All  other components within normal limits  TROPONIN I (HIGH SENSITIVITY) - Abnormal; Notable for the following components:   Troponin I (High Sensitivity) 40 (*)    All other components within normal limits  RESP PANEL BY RT-PCR (RSV, FLU A&B, COVID)  RVPGX2  MRSA NEXT GEN BY PCR, NASAL  MAGNESIUM  BASIC METABOLIC PANEL  MAGNESIUM  CBC    EKG EKG  Interpretation  Date/Time:  _0  bpm. Exam Location:  [facility] Procedure: 2D Echo,  Cardiac Doppler and Color Doppler Indications:    CHF-Acute Systolic X45.03  History:        Patient has prior history of Echocardiogram examinations, most                 recent 08/16/2021. CHF, Pulmonary HTN, Signs/Symptoms:Shortness of                 Breath; Risk Factors:Hypertension, Current Smoker and Diabetes.  Sonographer:    Greer Pickerel Referring Phys: [id number] OLADAPO ADEFESO  Sonographer Comments: Technically challenging study due to limited acoustic windows. Image acquisition challenging due to uncooperative patient, Image acquisition challenging due to COPD, Image acquisition challenging due to respiratory motion and Image acquisition challenging due to patient body habitus. IMPRESSIONS  1. Left ventricular ejection fraction, by estimation, is 55 to 60%. The left ventricle has normal function. The left ventricle has no regional wall motion abnormalities. Left ventricular diastolic parameters are indeterminate. There is the interventricular septum is flattened in systole and diastole, consistent with right ventricular pressure and volume overload.  2. Right ventricular systolic function is mildly reduced. The right ventricular size is mildly enlarged. There is severely elevated pulmonary artery systolic pressure. The estimated right ventricular systolic pressure is 34.9 mmHg.  3. Left atrial size was mild to moderately dilated.  4. Right atrial size was mild to moderately dilated.  5. The mitral valve is degenerative. Trivial mitral valve regurgitation. No evidence of mitral stenosis.  6. The aortic valve is tricuspid. Aortic valve regurgitation is not visualized. No aortic stenosis is present.  7. The inferior vena cava is dilated in size with <50% respiratory variability, suggesting right atrial pressure of  15 mmHg. Comparison(s): No significant change from prior study. FINDINGS  Left Ventricle: Left ventricular ejection fraction, by estimation, is 55 to 60%. The left ventricle has normal function. The  left ventricle has no regional wall motion abnormalities. The left ventricular internal cavity size was normal in size. There is  no left ventricular hypertrophy. The interventricular septum is flattened in systole and diastole, consistent with right ventricular pressure and volume overload. Left ventricular diastolic parameters are indeterminate. Right Ventricle: The right ventricular size is mildly enlarged. Right vetricular wall thickness was not well visualized. Right ventricular systolic function is mildly reduced. There is severely elevated pulmonary artery systolic pressure. The tricuspid regurgitant velocity is 3.58 m/s, and with an assumed right atrial pressure of 15 mmHg, the estimated right ventricular systolic pressure is 01.0 mmHg. Left Atrium: Left atrial size was mild to moderately dilated. Right Atrium: Right atrial size was mild to moderately dilated. Pericardium: There is no evidence of pericardial effusion. Mitral Valve: The mitral valve is degenerative in appearance. Trivial mitral valve regurgitation. No evidence of mitral valve stenosis. Tricuspid Valve: The tricuspid valve is grossly normal. Tricuspid valve regurgitation is trivial. No evidence of tricuspid stenosis. Aortic Valve: The aortic valve is tricuspid. Aortic valve regurgitation is not visualized. No aortic stenosis is present. Pulmonic Valve: The pulmonic valve was normal in structure. Pulmonic valve regurgitation is not visualized. No evidence of pulmonic stenosis. Aorta: The aortic root and ascending aorta are structurally normal, with no evidence of dilitation. Venous: The inferior vena cava is dilated in size with less than 50% respiratory variability, suggesting right atrial pressure of 15 mmHg. IAS/Shunts: The interatrial septum was not well visualized.  LEFT VENTRICLE PLAX 2D LVIDd:         4.10 cm   Diastology LVIDs:         2.80 cm   LV e' medial:    4.91 cm/s LV PW:         1.00 cm   LV E/e' medial:  16.3 LV IVS:         0.80 cm   LV e' lateral:   8.64 cm/s LVOT diam:     1.70 cm   LV E/e' lateral: 9.2 LV SV:         34 LV SV Index:   21 LVOT Area:     2.27 cm  RIGHT VENTRICLE RV S prime:     10.20 cm/s TAPSE (M-mode): 1.8 cm LEFT ATRIUM             Index        RIGHT ATRIUM           Index LA diam:        3.20 cm 2.01 cm/m   RA Area:     19.90 cm LA Vol (A2C):   80.8 ml 50.74 ml/m  RA Volume:   53.50 ml  33.60 ml/m LA Vol (A4C):   53.7 ml 33.72 ml/m LA Biplane Vol: 68.0 ml 42.70 ml/m  AORTIC VALVE             PULMONIC VALVE LVOT Vmax:   75.50 cm/s  PR End Diast Vel: 7.51 msec LVOT Vmean:  48.100 cm/s LVOT VTI:    0.148 m  AORTA Ao Root diam: 3.50 cm MITRAL VALVE               TRICUSPID VALVE MV Area (PHT): 4.68 cm    TR Peak grad:   51.3 mmHg MV Decel Time: 162 msec  TR Vmax:        358.00 cm/s MV E velocity: 79.90 cm/s MV A velocity: 81.50 cm/s  SHUNTS MV E/A ratio:  0.98        Systemic VTI:  0.15 m                            Systemic Diam: 1.70 cm Vishnu Priya Mallipeddi Electronically signed by Lorelee Cover Mallipeddi Signature Date/Time: 02/25/2022/1:55:16 PM    Final    DG Chest Port 1 View  Result Date: 02/25/2022 CLINICAL DATA:  Shortness of breath. EXAM: PORTABLE CHEST 1 VIEW COMPARISON:  02/25/2022 FINDINGS: Heart size is accentuated by technique. There is increased bibasilar opacity, obscuring both hemidiaphragms. Suspect significant RIGHT pleural effusion addition to atelectasis or infiltrate. IMPRESSION: Increased bibasilar opacities and pleural effusion. Electronically Signed   By: Nolon Nations M.D.   On: 02/25/2022 06:51   DG Chest Port 1 View  Result Date: 03/10/2022 CLINICAL DATA:  Shortness of breath EXAM: PORTABLE CHEST 1 VIEW COMPARISON:  01/27/2022 FINDINGS: Cardiomegaly. Aortic atherosclerosis. Chronic elevation of the right hemidiaphragm. Bibasilar atelectasis and consolidation. Small right pleural effusion. No pneumothorax. IMPRESSION: Bibasilar atelectasis and consolidation with small  right pleural effusion. Electronically Signed   By: Davina Poke D.O.   On: 02/12/2022 09:57    Procedures Procedures    Medications Ordered in ED Medications  atorvastatin (LIPITOR) tablet 20 mg (20 mg Oral Not Given 02/25/22 1058)  pantoprazole (PROTONIX) EC tablet 40 mg (40 mg Oral Not Given 05/10/05 6226)  folic acid (FOLVITE) tablet 1 mg (1 mg Oral Not Given 02/25/22 1058)  rivaroxaban (XARELTO) tablet 20 mg (20 mg Oral Given 02/25/22 1711)  zinc sulfate capsule 220 mg (220 mg Oral Not Given 02/25/22 1058)  albuterol (PROVENTIL) (2.5 MG/3ML) 0.083% nebulizer solution 2.5 mg (2.5 mg Nebulization Given 03/02/2022 1633)  sodium chloride flush (NS) 0.9 % injection 3 mL (3 mLs Intravenous Given 02/25/22 1059)  sodium chloride flush (NS) 0.9 % injection 3 mL (has no administration in time range)  0.9 %  sodium chloride infusion (has no administration in time range)  acetaminophen (TYLENOL) tablet 650 mg (has no administration in time range)    Or  acetaminophen (TYLENOL) suppository 650 mg (has no administration in time range)  ondansetron (ZOFRAN) tablet 4 mg (has no administration in time range)    Or  ondansetron (ZOFRAN) injection 4 mg (has no administration in time range)  Chlorhexidine Gluconate Cloth 2 % PADS 6 each (6 each Topical Given 02/25/22 1059)  ipratropium-albuterol (DUONEB) 0.5-2.5 (3) MG/3ML nebulizer solution 3 mL (3 mLs Nebulization Given 02/25/22 1516)  ipratropium-albuterol (DUONEB) 0.5-2.5 (3) MG/3ML nebulizer solution 3 mL (3 mLs Nebulization Given 02/25/2022 1029)  sodium zirconium cyclosilicate (LOKELMA) packet 10 g (10 g Oral Given 03/01/2022 1208)  furosemide (LASIX) injection 20 mg (20 mg Intravenous Given 03/10/2022 1603)  furosemide (LASIX) injection 40 mg (40 mg Intravenous Given 02/25/22 3335)    ED Course/ Medical Decision Making/ A&P                             Medical Decision Making Amount and/or Complexity of Data Reviewed Labs: ordered. Radiology:  ordered.  Risk Prescription drug management. Decision regarding hospitalization.   Initial Impression and Ddx 82 year old female who is well-appearing breathing comfortable on 5 L of oxygen nasal cannula presenting for shortness of breath.  Physical exam unremarkable aside  from noted requirement of 5 L to maintain O2 saturation.  Diagnosis was complaint includes pneumonia, CHF exacerbation, ACS, and PE. Patient PMH that increases complexity of ED encounter: COPD, CHF, and hypertension  Interpretation of Diagnostics I independent reviewed and interpreted the labs as followed: Elevated BNP and elevated troponin  - I independently visualized the following imaging with scope of interpretation limited to determining acute life threatening conditions related to emergency care: Chest x-ray, which revealed bibasilar atelectasis and small right-sided pleural effusion  Patient Reassessment and Ultimate Disposition/Management Given new increasing oxygen requirement, pleural effusion chest x-ray and elevated BNP initial concern was CHF exacerbation.  Treated with IV Lasix.  Treated shortness of breath with DuoNeb.  Admitted to the hospital for CHF exacerbation.  Per last echo in July 2023 ejection fraction was 55 to 60%.  Right ventricular systolic function mildly reduced.  Left ventricular diastolic dysfunction.  Patient management required discussion with the following services or consulting groups:  Hospitalist Service  Complexity of Problems Addressed Acute complicated illness or Injury  Additional Data Reviewed and Analyzed Further history obtained from: Prior ED visit notes and Prior labs/imaging results  Patient Encounter Risk Assessment Consideration of hospitalization         Final Clinical Impression(s) / ED Diagnoses Final diagnoses:  Shortness of breath    Rx / DC Orders ED Discharge Orders     None         Harriet Pho, PA-C 02/25/22 Charlynne Cousins, MD 2022-03-11 [phone]

## 2022-02-24 NOTE — ED Notes (Addendum)
Patient SPO2 dropped to 87%. Upon entering patients room, patient had taken her O2 via [place] off. Patient refusing to take deep breaths through her nose. Patient is a mouth breather. After several attempts at redirection,  patient started taking deep breaths through her nose. Patients SPO2 is back up to 92%. Nurse notified.

## 2022-02-24 NOTE — ED Triage Notes (Signed)
Pt c/o SOB at home. REMS states pt is normally 3 lpm continuous and upon arrival pt was on norebreather from fire dept oxygen at 97%. REMs placed pt on 4-6 lpm to keep above 90%. Pt alert and orient and requesting food.

## 2022-02-24 NOTE — Progress Notes (Signed)
Patient desating 50's-60's on RA and oxygen tubing pulled off upon entering room. Increased O2 to 7L O2 sat returned to 97%. Patient resting in bed appears to be in no distress. MD informed. Respiratory called and made aware.

## 2022-02-25 ENCOUNTER — Observation Stay (HOSPITAL_COMMUNITY): Payer: 59

## 2022-02-25 ENCOUNTER — Encounter (HOSPITAL_COMMUNITY): Payer: Self-pay | Admitting: Internal Medicine

## 2022-02-25 ENCOUNTER — Ambulatory Visit: Payer: Self-pay | Admitting: *Deleted

## 2022-02-25 DIAGNOSIS — M6281 Muscle weakness (generalized): Secondary | ICD-10-CM | POA: Diagnosis not present

## 2022-02-25 DIAGNOSIS — J9611 Chronic respiratory failure with hypoxia: Secondary | ICD-10-CM

## 2022-02-25 DIAGNOSIS — H5461 Unqualified visual loss, right eye, normal vision left eye: Secondary | ICD-10-CM | POA: Diagnosis present

## 2022-02-25 DIAGNOSIS — J9819 Other pulmonary collapse: Secondary | ICD-10-CM | POA: Diagnosis present

## 2022-02-25 DIAGNOSIS — I5021 Acute systolic (congestive) heart failure: Secondary | ICD-10-CM

## 2022-02-25 DIAGNOSIS — J90 Pleural effusion, not elsewhere classified: Secondary | ICD-10-CM | POA: Diagnosis not present

## 2022-02-25 DIAGNOSIS — R0602 Shortness of breath: Secondary | ICD-10-CM | POA: Diagnosis not present

## 2022-02-25 DIAGNOSIS — J449 Chronic obstructive pulmonary disease, unspecified: Secondary | ICD-10-CM | POA: Diagnosis not present

## 2022-02-25 DIAGNOSIS — I2782 Chronic pulmonary embolism: Secondary | ICD-10-CM | POA: Diagnosis not present

## 2022-02-25 DIAGNOSIS — Z9981 Dependence on supplemental oxygen: Secondary | ICD-10-CM | POA: Diagnosis not present

## 2022-02-25 DIAGNOSIS — J810 Acute pulmonary edema: Secondary | ICD-10-CM

## 2022-02-25 DIAGNOSIS — R93429 Abnormal radiologic findings on diagnostic imaging of unspecified kidney: Secondary | ICD-10-CM

## 2022-02-25 DIAGNOSIS — Z515 Encounter for palliative care: Secondary | ICD-10-CM | POA: Diagnosis not present

## 2022-02-25 DIAGNOSIS — J9601 Acute respiratory failure with hypoxia: Secondary | ICD-10-CM | POA: Diagnosis not present

## 2022-02-25 DIAGNOSIS — I5033 Acute on chronic diastolic (congestive) heart failure: Secondary | ICD-10-CM | POA: Diagnosis not present

## 2022-02-25 DIAGNOSIS — Z880 Allergy status to penicillin: Secondary | ICD-10-CM | POA: Diagnosis not present

## 2022-02-25 DIAGNOSIS — Z79899 Other long term (current) drug therapy: Secondary | ICD-10-CM | POA: Diagnosis not present

## 2022-02-25 DIAGNOSIS — Z9842 Cataract extraction status, left eye: Secondary | ICD-10-CM | POA: Diagnosis not present

## 2022-02-25 DIAGNOSIS — I2723 Pulmonary hypertension due to lung diseases and hypoxia: Secondary | ICD-10-CM | POA: Diagnosis not present

## 2022-02-25 DIAGNOSIS — I2694 Multiple subsegmental pulmonary emboli without acute cor pulmonale: Secondary | ICD-10-CM

## 2022-02-25 DIAGNOSIS — Z7984 Long term (current) use of oral hypoglycemic drugs: Secondary | ICD-10-CM | POA: Diagnosis not present

## 2022-02-25 DIAGNOSIS — I5031 Acute diastolic (congestive) heart failure: Secondary | ICD-10-CM | POA: Diagnosis not present

## 2022-02-25 DIAGNOSIS — D649 Anemia, unspecified: Secondary | ICD-10-CM | POA: Diagnosis not present

## 2022-02-25 DIAGNOSIS — J9859 Other diseases of mediastinum, not elsewhere classified: Secondary | ICD-10-CM

## 2022-02-25 DIAGNOSIS — Z7901 Long term (current) use of anticoagulants: Secondary | ICD-10-CM | POA: Diagnosis not present

## 2022-02-25 DIAGNOSIS — Z833 Family history of diabetes mellitus: Secondary | ICD-10-CM | POA: Diagnosis not present

## 2022-02-25 DIAGNOSIS — E785 Hyperlipidemia, unspecified: Secondary | ICD-10-CM | POA: Diagnosis not present

## 2022-02-25 DIAGNOSIS — J9621 Acute and chronic respiratory failure with hypoxia: Secondary | ICD-10-CM | POA: Diagnosis not present

## 2022-02-25 DIAGNOSIS — M545 Low back pain, unspecified: Secondary | ICD-10-CM | POA: Diagnosis present

## 2022-02-25 DIAGNOSIS — R931 Abnormal findings on diagnostic imaging of heart and coronary circulation: Secondary | ICD-10-CM

## 2022-02-25 DIAGNOSIS — Z66 Do not resuscitate: Secondary | ICD-10-CM | POA: Diagnosis not present

## 2022-02-25 DIAGNOSIS — R591 Generalized enlarged lymph nodes: Secondary | ICD-10-CM

## 2022-02-25 DIAGNOSIS — Z7189 Other specified counseling: Secondary | ICD-10-CM | POA: Diagnosis not present

## 2022-02-25 DIAGNOSIS — Z86711 Personal history of pulmonary embolism: Secondary | ICD-10-CM | POA: Diagnosis not present

## 2022-02-25 DIAGNOSIS — F17210 Nicotine dependence, cigarettes, uncomplicated: Secondary | ICD-10-CM | POA: Diagnosis not present

## 2022-02-25 DIAGNOSIS — Y929 Unspecified place or not applicable: Secondary | ICD-10-CM | POA: Diagnosis not present

## 2022-02-25 DIAGNOSIS — Z1152 Encounter for screening for COVID-19: Secondary | ICD-10-CM | POA: Diagnosis not present

## 2022-02-25 DIAGNOSIS — E875 Hyperkalemia: Secondary | ICD-10-CM | POA: Diagnosis not present

## 2022-02-25 DIAGNOSIS — R9389 Abnormal findings on diagnostic imaging of other specified body structures: Secondary | ICD-10-CM

## 2022-02-25 DIAGNOSIS — W1830XA Fall on same level, unspecified, initial encounter: Secondary | ICD-10-CM | POA: Diagnosis not present

## 2022-02-25 DIAGNOSIS — I110 Hypertensive heart disease with heart failure: Secondary | ICD-10-CM | POA: Diagnosis not present

## 2022-02-25 DIAGNOSIS — E119 Type 2 diabetes mellitus without complications: Secondary | ICD-10-CM | POA: Diagnosis not present

## 2022-02-25 HISTORY — DX: Abnormal radiologic findings on diagnostic imaging of unspecified kidney: R93.429

## 2022-02-25 HISTORY — DX: Other diseases of mediastinum, not elsewhere classified: J98.59

## 2022-02-25 HISTORY — DX: Generalized enlarged lymph nodes: R59.1

## 2022-02-25 HISTORY — DX: Abnormal findings on diagnostic imaging of other specified body structures: R93.89

## 2022-02-25 HISTORY — DX: Abnormal findings on diagnostic imaging of heart and coronary circulation: R93.1

## 2022-02-25 HISTORY — DX: Chronic respiratory failure with hypoxia: J96.11

## 2022-02-25 LAB — BLOOD GAS, ARTERIAL
Acid-Base Excess: 1.6 mmol/L (ref 0.0–2.0)
Bicarbonate: 29.5 mmol/L — ABNORMAL HIGH (ref 20.0–28.0)
Drawn by: 41977
FIO2: 100 %
O2 Saturation: 99.6 %
Patient temperature: 37
pCO2 arterial: 60 mmHg — ABNORMAL HIGH (ref 32–48)
pH, Arterial: 7.3 — ABNORMAL LOW (ref 7.35–7.45)
pO2, Arterial: 118 mmHg — ABNORMAL HIGH (ref 83–108)

## 2022-02-25 LAB — CBC
HCT: 31 % — ABNORMAL LOW (ref 36.0–46.0)
Hemoglobin: 8.6 g/dL — ABNORMAL LOW (ref 12.0–15.0)
MCH: 21.1 pg — ABNORMAL LOW (ref 26.0–34.0)
MCHC: 27.7 g/dL — ABNORMAL LOW (ref 30.0–36.0)
MCV: 76.2 fL — ABNORMAL LOW (ref 80.0–100.0)
Platelets: 311 10*3/uL (ref 150–400)
RBC: 4.07 MIL/uL (ref 3.87–5.11)
RDW: 23.5 % — ABNORMAL HIGH (ref 11.5–15.5)
WBC: 6.7 10*3/uL (ref 4.0–10.5)
nRBC: 0.7 % — ABNORMAL HIGH (ref 0.0–0.2)

## 2022-02-25 LAB — BASIC METABOLIC PANEL
Anion gap: 10 (ref 5–15)
BUN: 38 mg/dL — ABNORMAL HIGH (ref 8–23)
CO2: 24 mmol/L (ref 22–32)
Calcium: 8.1 mg/dL — ABNORMAL LOW (ref 8.9–10.3)
Chloride: 105 mmol/L (ref 98–111)
Creatinine, Ser: 1.29 mg/dL — ABNORMAL HIGH (ref 0.44–1.00)
GFR, Estimated: 42 mL/min — ABNORMAL LOW (ref >60)
Glucose, Bld: 91 mg/dL (ref 70–99)
Potassium: 4.7 mmol/L (ref 3.5–5.1)
Sodium: 139 mmol/L (ref 135–145)

## 2022-02-25 LAB — ECHOCARDIOGRAM COMPLETE
Area-P 1/2: 4.68 cm2
Height: 64 in
S' Lateral: 2.8 cm
Weight: 1971.79 oz

## 2022-02-25 LAB — MRSA NEXT GEN BY PCR, NASAL: MRSA by PCR Next Gen: NOT DETECTED

## 2022-02-25 LAB — BRAIN NATRIURETIC PEPTIDE: B Natriuretic Peptide: 507 pg/mL — ABNORMAL HIGH (ref 0.0–100.0)

## 2022-02-25 LAB — MAGNESIUM: Magnesium: 2 mg/dL (ref 1.7–2.4)

## 2022-02-25 MED ORDER — IPRATROPIUM-ALBUTEROL 0.5-2.5 (3) MG/3ML IN SOLN
3.0000 mL | Freq: Four times a day (QID) | RESPIRATORY_TRACT | Status: DC
  Administered 2022-02-25 – 2022-02-26 (×7): 3 mL via RESPIRATORY_TRACT
  Filled 2022-02-25 (×7): qty 3

## 2022-02-25 MED ORDER — CHLORHEXIDINE GLUCONATE CLOTH 2 % EX PADS
6.0000 | MEDICATED_PAD | Freq: Every day | CUTANEOUS | Status: DC
  Administered 2022-02-25 – 2022-02-26 (×2): 6 via TOPICAL

## 2022-02-25 MED ORDER — FUROSEMIDE 10 MG/ML IJ SOLN
40.0000 mg | Freq: Once | INTRAMUSCULAR | Status: AC
  Administered 2022-02-25: 40 mg via INTRAVENOUS
  Filled 2022-02-25: qty 4

## 2022-02-25 NOTE — Progress Notes (Signed)
PROGRESS NOTE  RN called and states that Patient is having hard time maintaining her o2. patient on 10L sating in the 19s. breathing is shallow but patient is alert. ABG and chest x-ray were ordered Chest x-ray personally reviewed showed flash pulmonary edema with worsening pleural effusion compared to chest x-ray from yesterday. BiPAP was ordered, IV Lasix 40 mg x 1 was ordered and patient will be transferred to stepdown unit   Physical Exam  BP (!) 115/49 (BP Location: Left Arm)   Pulse 77   Temp 97.9 F (36.6 C) (Oral)   Resp (!) 24   Ht _0  (1.626 m)   Wt 55.9 kg   SpO2 90%   BMI 21.15 kg/m    Gen:- Awake Alert, in acute distress HEENT:- [place].AT, No sclera icterus Neck-Supple Neck,No JVD,.  Lungs-bilateral crackles in lower lobes on auscultation CV- S1, S2 normal Abd-  +ve B.Sounds, Abd Soft, No tenderness,    Extremity/Skin:- No  edema,    Psych-affect is appropriate, oriented x3 Neuro-no new focal deficits, no tremors  Assessment and plan: Flash pulmonary edema This may be related to patient's history of CHF IV Lasix 40 mg x 1 will be given Continue BiPAP Echocardiogram will be done  Consider cardiology consult based on echo findings  Critical time: 34 minutes   Critical care personally provided  managing the patient due to high probability of clinically significant and life threatening deterioration. This critical care time included obtaining a history; examining the patient, pulse oximetry; ordering and review of studies; arranging urgent treatment with development of a management plan; evaluation of patient's response of treatment; frequent reassessment; and discussions with other providers.  This critical care time was performed to assess and manage the high probability of imminent and life threatening deterioration that could result in multi-organ failure.

## 2022-02-25 NOTE — Patient Outreach (Signed)
  Care Coordination   02/25/2022 Name: Marisa Bailey MRN: [id number] DOB: February 17, 1940   Care Coordination Outreach Attempts:  An unsuccessful telephone outreach was attempted for a scheduled appointment today. Patient is currently hospitalized.   Follow Up Plan:  Additional outreach attempts will be made to offer the patient care coordination information and services.   Encounter Outcome:  No Answer   Care Coordination Interventions:  No, not indicated. Message sent to Care Guide to reschedule once discharged.    Chong Sicilian, BSN, RN-BC RN Care Coordinator [place] Direct Dial: [phone] Main #: [phone] g

## 2022-02-25 NOTE — Progress Notes (Signed)
Patient desating in the 70s on 7L O2. Patient placed on 10L but O2 only increased to 77-81% Patient placed on a nonrebreather, O2 increased to 97%. Patient is alert, but has swallow breathing. MD Josephine Cables made aware. Charge nurse made aware. Respiratory made aware. Will continue to monitor.

## 2022-02-25 NOTE — Progress Notes (Signed)
  Echocardiogram 2D Echocardiogram has been performed.  Marisa Bailey 02/25/2022, 10:14 AM

## 2022-02-25 NOTE — Progress Notes (Signed)
PROGRESS NOTE    Marisa Bailey  ZOX:[id number] DOB: 1940/12/15 DOA: 02/13/2022 PCP: [name], MD   Brief Narrative:  Marisa Bailey is a 83 y.o. 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  She was admitted for acute diastolic CHF exacerbation with acute on chronic hypoxemic respiratory failure.  This worsened into the evening of her admission with worsening tachypnea and hypoxemia and she required transfer to ICU with need for BiPAP and further diuresis.  2D echocardiogram ordered and pending.  Pulmonology consulted for assistance in management.  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)   Symptomatic anemia   Essential hypertension   Blindness of right eye   GI bleed  Assessment and Plan:  Acute on chronic hypoxemic respiratory failure-multifactorial BNP 497 on admission and chest x-ray with mild right-sided pleural effusion noted Prior 2D echocardiogram 08/2021 with EF 55-60% and grade 2 diastolic dysfunction with moderate pulmonary hypertension Worsening hypoxemia overnight and placed on BiPAP and transferred to ICU BNP appears stable Agree with IV diuresis as prescribed, hold further for now    History of COPD with chronic hypoxemia Wears 4 L nasal cannula at home, currently on 4 L with no acute hypoxemia noted No acute bronchospasms noted DuoNebs as needed for shortness of breath or wheezing Currently on BiPAP without pCO2 elevation   Chronic anemia Prior history of GI bleed Continue Protonix No overt bleeding  noted Continue to monitor   History of prior PE Continue Xarelto   Hypertension Hold home amlodipine given softer blood pressure readings while on aggressive IV diuresis   Prior tobacco abuse Denies current tobacco abuse    DVT prophylaxis: Xarelto Code Status: Full Family Communication: Niece on phone 1/15 Disposition Plan:  Status is: Observation The patient will require care spanning > 2 midnights and should be moved to inpatient because: Need for IV medications and close monitoring.  Consultants:  Pulmonology  Procedures:  None  Antimicrobials:  None   Subjective: Patient seen and evaluated today with no new acute complaints or concerns. No acute concerns or events noted overnight.  Objective: Vitals:   02/25/22 0800 02/25/22 1100 02/25/22 1200 02/25/22 1235  BP: (!) 158/53 (!) 144/53 (!) 128/49 (!) 128/49  Pulse: 83 68  72  Resp: (!) 23 (!) _0 Temp:      TempSrc:      SpO2: 100% 100%  100%  Weight:      Height:       No intake or output data in the 24 hours ending 02/25/22 1243 Filed Weights   02/22/2022 0920 02/25/22 0555  Weight: 59 kg 55.9 kg    Examination:  General exam: Appears calm and comfortable  Respiratory system: Diminished bilaterally, on BiPAP FiO2 100% Cardiovascular system: S1 & S2 heard, RRR.  Gastrointestinal system: Abdomen is soft Central nervous system: Somnolent Extremities: No edema Skin: No significant lesions noted Psychiatry: Flat affect.    Data Reviewed: I have personally reviewed following labs and imaging studies  CBC: Recent Labs  Lab 02/15/2022 0948 02/25/22 0449  WBC  6.0 6.7  HGB 9.8* 8.6*  HCT 34.4* 31.0*  MCV 75.9* 76.2*  PLT 382 976   Basic Metabolic Panel: Recent Labs  Lab 02/21/2022 0948 03/01/2022 1105 02/25/22 0449  NA 135  --  139  K 6.0* 6.0* 4.7  CL 104  --  105  CO2 22  --  24  GLUCOSE 118*  --  91  BUN 44*  --  38*  CREATININE 1.61*  --  1.29*  CALCIUM 8.5*  --  8.1*  MG  --    --  2.0   GFR: Estimated Creatinine Clearance: 29.5 mL/min (A) (by C-G formula based on SCr of 1.29 mg/dL (H)). Liver Function Tests: Recent Labs  Lab 03/09/2022 0948  AST 16  ALT 11  ALKPHOS 92  BILITOT 0.5  PROT 7.5  ALBUMIN 3.2*   No results for input(s): "LIPASE", "AMYLASE" in the last 168 hours. No results for input(s): "AMMONIA" in the last 168 hours. Coagulation Profile: No results for input(s): "INR", "PROTIME" in the last 168 hours. Cardiac Enzymes: No results for input(s): "CKTOTAL", "CKMB", "CKMBINDEX", "TROPONINI" in the last 168 hours. BNP (last 3 results) No results for input(s): "PROBNP" in the last 8760 hours. HbA1C: No results for input(s): "HGBA1C" in the last 72 hours. CBG: No results for input(s): "GLUCAP" in the last 168 hours. Lipid Profile: No results for input(s): "CHOL", "HDL", "LDLCALC", "TRIG", "CHOLHDL", "LDLDIRECT" in the last 72 hours. Thyroid Function Tests: No results for input(s): "TSH", "T4TOTAL", "FREET4", "T3FREE", "THYROIDAB" in the last 72 hours. Anemia Panel: No results for input(s): "VITAMINB12", "FOLATE", "FERRITIN", "TIBC", "IRON", "RETICCTPCT" in the last 72 hours. Sepsis Labs: No results for input(s): "PROCALCITON", "LATICACIDVEN" in the last 168 hours.  Recent Results (from the past 240 hour(s))  Resp panel by RT-PCR (RSV, Flu A&B, Covid) Anterior Nasal Swab     Status: None   Collection Time: 03/10/2022 10:34 AM   Specimen: Anterior Nasal Swab  Result Value Ref Range Status   SARS Coronavirus 2 by RT PCR NEGATIVE NEGATIVE Final    Comment: (NOTE) SARS-CoV-2 target nucleic acids are NOT DETECTED.  The SARS-CoV-2 RNA is generally detectable in upper respiratory specimens during the acute phase of infection. The lowest concentration of SARS-CoV-2 viral copies this assay can detect is 138 copies/mL. A negative result does not preclude SARS-Cov-2 infection and should not be used as the sole basis for treatment or other patient  management decisions. A negative result may occur with  improper specimen collection/handling, submission of specimen other than nasopharyngeal swab, presence of viral mutation(s) within the areas targeted by this assay, and inadequate number of viral copies(<138 copies/mL). A negative result must be combined with clinical observations, patient history, and epidemiological information. The expected result is Negative.  Fact Sheet for Patients:  EntrepreneurPulse.com.au  Fact Sheet for Healthcare Providers:  IncredibleEmployment.be  This test is no t yet approved or cleared by the [place] FDA and  has been authorized for detection and/or diagnosis of SARS-CoV-2 by FDA under an Emergency Use Authorization (EUA). This EUA will remain  in effect (meaning this test can be used) for the duration of the COVID-19 declaration under Section 564(b)(1) of the Act, 21 U.S.C.section 360bbb-3(b)(1), unless the authorization is terminated  or revoked sooner.       Influenza A by PCR NEGATIVE NEGATIVE Final   Influenza B by PCR NEGATIVE NEGATIVE Final    Comment: (NOTE) The Xpert Xpress SARS-CoV-2/FLU/RSV plus assay is intended as an aid in the diagnosis  of influenza from Nasopharyngeal swab specimens and should not be used as a sole basis for treatment. Nasal washings and aspirates are unacceptable for Xpert Xpress SARS-CoV-2/FLU/RSV testing.  Fact Sheet for Patients: EntrepreneurPulse.com.au  Fact Sheet for Healthcare Providers: IncredibleEmployment.be  This test is not yet approved or cleared by the [place] FDA and has been authorized for detection and/or diagnosis of SARS-CoV-2 by FDA under an Emergency Use Authorization (EUA). This EUA will remain in effect (meaning this test can be used) for the duration of the COVID-19 declaration under Section 564(b)(1) of the Act, 21 U.S.C. section 360bbb-3(b)(1),  unless the authorization is terminated or revoked.     Resp Syncytial Virus by PCR NEGATIVE NEGATIVE Final    Comment: (NOTE) Fact Sheet for Patients: EntrepreneurPulse.com.au  Fact Sheet for Healthcare Providers: IncredibleEmployment.be  This test is not yet approved or cleared by the [place] FDA and has been authorized for detection and/or diagnosis of SARS-CoV-2 by FDA under an Emergency Use Authorization (EUA). This EUA will remain in effect (meaning this test can be used) for the duration of the COVID-19 declaration under Section 564(b)(1) of the Act, 21 U.S.C. section 360bbb-3(b)(1), unless the authorization is terminated or revoked.  Performed at [facility], [address], [place], [place] [address]   MRSA Next Gen by PCR, Nasal     Status: None   Collection Time: 02/25/22  6:48 AM   Specimen: Nasal Mucosa; Nasal Swab  Result Value Ref Range Status   MRSA by PCR Next Gen NOT DETECTED NOT DETECTED Final    Comment: (NOTE) The GeneXpert MRSA Assay (FDA approved for NASAL specimens only), is one component of a comprehensive MRSA colonization surveillance program. It is not intended to diagnose MRSA infection nor to guide or monitor treatment for MRSA infections. Test performance is not FDA approved in patients less than 14 years old. Performed at [facility], [address] [place], [place] [address]          Radiology Studies: [facility] 1 View  Result Date: 02/25/2022 CLINICAL DATA:  Shortness of breath. EXAM: PORTABLE CHEST 1 VIEW COMPARISON:  03/06/2022 FINDINGS: Heart size is accentuated by technique. There is increased bibasilar opacity, obscuring both hemidiaphragms. Suspect significant RIGHT pleural effusion addition to atelectasis or infiltrate. IMPRESSION: Increased bibasilar opacities and pleural effusion. Electronically Signed   By: Nolon Nations M.D.   On: 02/25/2022 06:51   DG Chest Port 1 View  Result  Date: 02/18/2022 CLINICAL DATA:  Shortness of breath EXAM: PORTABLE CHEST 1 VIEW COMPARISON:  01/27/2022 FINDINGS: Cardiomegaly. Aortic atherosclerosis. Chronic elevation of the right hemidiaphragm. Bibasilar atelectasis and consolidation. Small right pleural effusion. No pneumothorax. IMPRESSION: Bibasilar atelectasis and consolidation with small right pleural effusion. Electronically Signed   By: Davina Poke D.O.   On: 03/04/2022 09:57   CT Lumbar Spine Wo Contrast  Result Date: 02/23/2022 CLINICAL DATA:  Back pain for 1 week.  Unwitnessed fall EXAM: CT LUMBAR SPINE WITHOUT CONTRAST TECHNIQUE: Multidetector CT imaging of the lumbar spine was performed without intravenous contrast administration. Multiplanar CT image reconstructions were also generated. RADIATION DOSE REDUCTION: This exam was performed according to the departmental dose-optimization program which includes automated exposure control, adjustment of the mA and/or kV according to patient size and/or use of iterative reconstruction technique. COMPARISON:  X-ray 01/27/2022, CT 11/23/2019.  Ultrasound 09/18/2020 FINDINGS: Segmentation: 5 lumbar type vertebrae. Alignment: Normal. Vertebrae: No acute fracture or focal pathologic process. Paraspinal and other soft tissues: Partially imaged bilateral pleural effusions.  Aortic atherosclerosis. Right renal cyst. The distal indeterminate density bilateral renal lesions are not appreciably changed since 2021 and were better characterized by ultrasound in 2022. No follow-up imaging is required. Advanced aortoiliac atherosclerosis. Disc levels: Mild disc height loss at L2-L3 and L3-L4. Relatively mild lower lumbar facet arthropathy. Relatively mild foraminal narrowing is most notable at the L3-4 level on the left. No evidence of high-grade canal stenosis by CT. IMPRESSION: 1. No acute fracture or traumatic malalignment of the lumbar spine. 2. 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 3. Partially  imaged bilateral pleural effusions. 4. Aortic atherosclerosis (ICD10-I70.0). Electronically Signed   By: Davina Poke D.O.   On: 02/23/2022 17:09        Scheduled Meds:  atorvastatin  20 mg Oral Daily   Chlorhexidine Gluconate Cloth  6 each Topical Daily   folic acid  1 mg Oral Daily   pantoprazole  40 mg Oral Daily   rivaroxaban  20 mg Oral Q supper   sodium chloride flush  3 mL Intravenous Q12H   zinc sulfate  220 mg Oral Daily   Continuous Infusions:  sodium chloride       LOS: 0 days    Time spent: 35 minutes    [employer] [place] Darleen Crocker, DO [facility]  If 7PM-7AM, please contact night-coverage [email or web address] 02/25/2022, 12:43 PM

## 2022-02-25 NOTE — Progress Notes (Signed)
Pt niece, [name], given update at this time.

## 2022-02-25 NOTE — Plan of Care (Signed)

## 2022-02-25 NOTE — Progress Notes (Signed)
  Transition of Care [facility]) Screening Note   Patient Details  Name: Marisa Bailey Date of Birth: August 24, 1940   Transition of Care [facility]) CM/SW Contact:    Iona Beard, [place] Phone Number: 02/25/2022, 11:26 AM    Transition of Care Department [facility]) has reviewed patient and no TOC needs have been identified at this time. We will continue to monitor patient advancement through interdisciplinary progression rounds. If new patient transition needs arise, please place a TOC consult.

## 2022-02-25 NOTE — Consult Note (Signed)
NAME:  Marisa Bailey, MRN:  [id number], DOB:  04/24/1940, LOS: 0 ADMISSION DATE:  03/08/2022, CONSULTATION DATE:  1/15 REFERRING MD:  Shah/Triad , CHIEF COMPLAINT:  resp distress    History of Present Illness:  22 yobf pt of Dr Juanetta Gosling with dx of copd/bronchiectasis/still smoking on 4lpm NP   Pertinent  Medical History  82 y.o. 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  She was seen in the ED one day PTA for complaints of back pain but did not appear to have any issues with shortness of breath at that time.  Niece called EMS this morning for shortness of breath with initial sats around 90% which improved to 97% after further supplementation from EMS.  She denied any chest pain and appears to have a mild cough that is nonproductive ht.   Significant Hospital Events: Including procedures, antibiotic start and stop dates in addition to other pertinent events      Scheduled Meds:  atorvastatin  20 mg Oral Daily   Chlorhexidine Gluconate Cloth  6 each Topical Daily   folic acid  1 mg Oral Daily   pantoprazole  40 mg Oral Daily   rivaroxaban  20 mg Oral Q supper   sodium chloride flush  3 mL Intravenous Q12H   zinc sulfate  220 mg Oral Daily   Continuous Infusions:  sodium chloride     PRN Meds:.sodium chloride, acetaminophen **OR** acetaminophen, albuterol, ondansetron **OR** ondansetron (ZOFRAN) IV, sodium chloride flush    Interim History / Subjective:  Better on bipap in terms of wob/ no specific complaints at this time but very withdrawn.  Objective   Blood pressure (!) 158/53, pulse 83, temperature 98.2 F (36.8 C), temperature source Axillary, resp. rate (!) 23, height _0  (1.626 m), weight 55.9 kg, SpO2 100 %.    FiO2  (%):  [100 %] 100 %  No intake or output data in the 24 hours ending 02/25/22 0937 Filed Weights   03/10/2022 0920 02/25/22 0555  Weight: 59 kg 55.9 kg    Examination: Tmax:  98.2 General appearance:    elderly bf lying in fetal position on bipap declined to answer questions or participate in care per nursing    No jvd Oropharynx clear,  mucosa nl Neck supple Lungs with  very distant bs esp L base  RRR no s3 or or sign murmur Abd soft with nl  excursion  Extr warm with no edema or clubbing noted Neuro  Sensorium withdrawn,  no apparent motor deficits     I personally reviewed images and agree with radiology impression as follows:  CXR:   portable 1/15 Increased bibasilar opacities and pleural effusion.    I personally reviewed images and agree with radiology impression as follows:   Chest CTa   01/27/22  1. No acute pulmonary emboli. Bilateral chronic pulmonary emboli are improved compared to prior exam 08/15/2021. 2. New mediastinal and hilar lymphadenopathy with a 2.8 cm soft tissue density mass in the anterior mediastinum, favored to represent a prevascular lymph node. 3. Soft tissue density surrounding the distal trachea, bilateral main bronchi, and extending into the hila bilaterally with resulting luminal narrowing of the left lower lobe bronchus as well as subsegmental bronchi of the right middle and  lower lobes. Given findings of new lymphadenopathy, findings are suspicious for malignancy. Consider further evaluation with PET-CT or tissue sampling. 4. Near-complete atelectatic collapse of the left lower lobe and medial segment of the right middle lobe secondary to bronchial luminal narrowing described above. 5. Indeterminate 0.9 cm irregular solid pulmonary nodule is noted in the left apex. Given suspicious findings in the mediastinum, consider further evaluation with PET-CT or tissue sampling. 6. Indeterminate 1 cm hyperattenuating cortical lesion in the  middle third of the left kidney. When the patient is clinically stable and able to follow directions and hold their breath (preferably as an outpatient) further evaluation with dedicated abdominal MRI should be considered. 7. Right-sided cardiac enlargement, similar to prior exam.      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   4) Recent PE with RVE by CTa  01/27/22   ? Paraneoplastic ?  Trousseau's syndrome ? (Neg venous dopplers 08/16/21 noted) >>> continue xarelto for now   Labs   CBC: Recent Labs  Lab 02/18/2022 0948 02/25/22 0449  WBC 6.0 6.7  HGB 9.8* 8.6*  HCT 34.4* 31.0*  MCV 75.9* 76.2*  PLT 382 855    Basic Metabolic Panel: Recent Labs  Lab 03/12/2022 0948 02/16/2022 1105 02/25/22 0449  NA 135  --  139  K 6.0* 6.0* 4.7  CL 104  --  105  CO2 22  --  24  GLUCOSE 118*  --  91  BUN 44*  --  38*  CREATININE 1.61*  --  1.29*  CALCIUM 8.5*  --  8.1*  MG  --   --  2.0   GFR: Estimated Creatinine Clearance: 29.5 mL/min (A) (by C-G formula based on SCr of 1.29 mg/dL (H)). Recent Labs  Lab 02/18/2022 0948 02/25/22 0449  WBC 6.0 6.7    Liver Function Tests: Recent Labs  Lab 02/26/2022 0948  AST 16  ALT 11  ALKPHOS 92  BILITOT 0.5  PROT 7.5  ALBUMIN 3.2*   No results for input(s): "LIPASE", "AMYLASE" in the last 168 hours. No results for input(s):  "AMMONIA" in the last 168 hours.  ABG    Component Value Date/Time   PHART 7.3 (L) 02/25/2022 0824   PCO2ART 60 (H) 02/25/2022 0824   PO2ART 118 (H) 02/25/2022 0824   HCO3 29.5 (H) 02/25/2022 0824   ACIDBASEDEF 0.2 03/13/2021 1707   O2SAT 99.6 02/25/2022 0824     Coagulation Profile: No results for input(s): "INR", "PROTIME" in the last 168 hours.  Cardiac Enzymes: No results for input(s): "CKTOTAL", "CKMB", "CKMBINDEX", "TROPONINI" in the last 168 hours.  HbA1C: Hgb A1c MFr Bld  Date/Time Value Ref Range Status  09/11/2021 11:03 AM 5.9 (H) 4.8 - 5.6 % Final    Comment:             Prediabetes: 5.7 -  6.4          Diabetes: >6.4          Glycemic control for adults with diabetes: <7.0   09/07/2020 11:06 AM 5.5 <5.7 % of total Hgb Final    Comment:    For the purpose of screening for the presence of diabetes: . <5.7%       Consistent with the absence of diabetes 5.7-6.4%    Consistent with increased risk for diabetes             (prediabetes) > or =6.5%  Consistent with diabetes . This assay result is consistent with a decreased risk of diabetes. . Currently, no consensus exists regarding use of hemoglobin A1c for diagnosis of diabetes in children. . According to American Diabetes Association (ADA) guidelines, hemoglobin A1c <7.0% represents optimal control in non-pregnant diabetic patients. Different metrics may apply to specific patient populations.  Standards of Medical Care in Diabetes(ADA). .     CBG: No results for input(s): "GLUCAP" in the last 168 hours.     Past Medical History:  She,  has a past medical history of Aspiration pneumonia ([place]) (11/03/2020), Blind right eye, CHF (congestive heart failure) ([place]), COPD (chronic obstructive pulmonary disease) ([place]), Diabetes mellitus without complication ([place]), Fx upper humerus-closed (08/17/2012), H/O blood clots, and Hypertension.   Surgical History:   Past Surgical History:  Procedure Laterality  Date   CATARACT EXTRACTION W/PHACO Left 09/24/2021   Procedure: CATARACT EXTRACTION PHACO AND INTRAOCULAR LENS PLACEMENT (IOC);  Surgeon: Baruch Goldmann, MD;  Location: [facility];  Service: Ophthalmology;  Laterality: Left;  CDE: 8.08   ENUCLEATION     ESOPHAGOGASTRODUODENOSCOPY (EGD) WITH PROPOFOL N/A 10/25/2021   Procedure: ESOPHAGOGASTRODUODENOSCOPY (EGD) WITH PROPOFOL;  Surgeon: Harvel Quale, MD;  Location: [facility];  Service: Gastroenterology;  Laterality: N/A;   HEMOSTASIS CLIP PLACEMENT  10/25/2021   Procedure: HEMOSTASIS CLIP PLACEMENT;  Surgeon: Harvel Quale, MD;  Location: [facility];  Service: Gastroenterology;;   HOT HEMOSTASIS  10/25/2021   Procedure: HOT HEMOSTASIS (ARGON PLASMA COAGULATION/BICAP);  Surgeon: Montez Morita, Quillian Quince, MD;  Location: [facility];  Service: Gastroenterology;;   SUBMUCOSAL INJECTION  10/25/2021   Procedure: SUBMUCOSAL INJECTION;  Surgeon: Harvel Quale, MD;  Location: [facility];  Service: Gastroenterology;;     Social History:   reports that she has been smoking cigarettes. She has a 18.00 pack-year smoking history. She has never used smokeless tobacco. She reports that she does not currently use alcohol. She reports that she does not use drugs.   Family History:  Her family history includes Diabetes in her mother.   Allergies Allergies  Allergen Reactions   Penicillins Rash    Has patient had a PCN reaction causing immediate rash, facial/tongue/throat swelling, SOB or lightheadedness with hypotension: {Yes/No:[id number] Has patient had a PCN reaction causing severe rash involving mucus membranes or skin necrosis: Yes Has patient had a PCN reaction that required hospitalization No Has patient had a PCN reaction occurring within the last 10 years: No If all of the above answers are "NO", then may proceed with Cephalosporin use.  **Has tolerated keflex      Home Medications  Prior to Admission  medications   Medication Sig Start Date End Date Taking? Authorizing Provider  pantoprazole (PROTONIX) 40 MG tablet Take 1 tablet (40 mg total) by mouth daily. 01/14/22  Yes Emokpae, Courage, MD  rivaroxaban (XARELTO) 20 MG TABS tablet Take 1 tablet (20 mg total) by mouth daily  with supper. 01/14/22  Yes Roxan Hockey, MD  ACCU-CHEK GUIDE test strip SMARTSIG:Via Meter 1-4 Times Daily 01/14/22   [provider]  Accu-Chek Softclix Lancets lancets SMARTSIG:Via Meter 1-4 Times Daily 01/14/22   [provider]  albuterol (PROVENTIL) (2.5 MG/3ML) 0.083% nebulizer solution Take 3 mLs (2.5 mg total) by nebulization every 2 (two) hours as needed for wheezing or shortness of breath. 01/14/22   Roxan Hockey, MD  albuterol (VENTOLIN HFA) 108 (90 Base) MCG/ACT inhaler Inhale 2 puffs into the lungs every 6 (six) hours as needed for wheezing or shortness of breath. 01/14/22   Roxan Hockey, MD  amLODipine (NORVASC) 5 MG tablet Take 1 tablet (5 mg total) by mouth daily. 01/14/22   Roxan Hockey, MD  atorvastatin (LIPITOR) 20 MG tablet Take 1 tablet (20 mg total) by mouth daily. 01/14/22   Roxan Hockey, MD  blood glucose meter kit and supplies KIT Dispense based on patient and insurance preference. Use up to four times daily as directed. 01/14/22   Roxan Hockey, MD  blood glucose meter kit and supplies Dispense based on patient and insurance preference. Use up to four times daily as directed. (FOR ICD-9 250.00, 250.01). 01/14/22   Roxan Hockey, MD  Cholecalciferol (VITAMIN D3) 25 MCG (1000 UT) CAPS Take 1 capsule by mouth daily.    [provider]  folic acid (FOLVITE) 1 MG tablet Take 1 tablet (1 mg total) by mouth daily. 01/14/22   Roxan Hockey, MD  furosemide (LASIX) 20 MG tablet Take 1 tablet (20 mg total) by mouth 2 (two) times daily. 01/14/22 01/14/23  Roxan Hockey, MD  ipratropium-albuterol (DUONEB) 0.5-2.5 (3) MG/3ML SOLN Take 3 mLs by nebulization every 4 (four)  hours as needed. 01/14/22   Emokpae, Courage, MD  KLOR-CON M20 20 MEQ tablet Take 20 mEq by mouth daily. 01/14/22   [provider]  metFORMIN (GLUCOPHAGE) 500 MG tablet Take 1 tablet (500 mg total) by mouth 2 (two) times daily. 01/14/22   Roxan Hockey, MD  Nebulizers (COMPRESSOR/NEBULIZER) MISC 1 Units by Does not apply route daily as needed. 01/14/22   Roxan Hockey, MD  zinc sulfate 220 (50 Zn) MG capsule Take 1 capsule (220 mg total) by mouth daily. 01/15/22   Roxan Hockey, MD     The patient is critically ill with multiple organ systems failure and requires high complexity decision making for assessment and support, frequent evaluation and titration of therapies, application of advanced monitoring technologies and extensive interpretation of multiple databases. Critical Care Time devoted to patient care services described in this note is 45  minutes.   Christinia Gully, MD Pulmonary and [place] [phone]   After 7:00 pm call Elink  [phone]

## 2022-02-25 NOTE — Progress Notes (Signed)
MD came to bedside and assessed patient.  MD ordered for blood gas and chest x-ray done per MD order.  Patient is currently on non rebreather with oxygen saturations in the 90s.  Patient to be transferred to ICU for more intensive monitoring and care.

## 2022-02-26 ENCOUNTER — Other Ambulatory Visit: Payer: Self-pay | Admitting: Internal Medicine

## 2022-02-26 ENCOUNTER — Encounter (HOSPITAL_COMMUNITY): Payer: Self-pay | Admitting: Internal Medicine

## 2022-02-26 DIAGNOSIS — E785 Hyperlipidemia, unspecified: Secondary | ICD-10-CM

## 2022-02-26 DIAGNOSIS — J9601 Acute respiratory failure with hypoxia: Secondary | ICD-10-CM | POA: Diagnosis not present

## 2022-02-26 DIAGNOSIS — Z7189 Other specified counseling: Secondary | ICD-10-CM

## 2022-02-26 DIAGNOSIS — Z515 Encounter for palliative care: Secondary | ICD-10-CM | POA: Diagnosis not present

## 2022-02-26 DIAGNOSIS — I5031 Acute diastolic (congestive) heart failure: Secondary | ICD-10-CM | POA: Diagnosis not present

## 2022-02-26 LAB — CBC
HCT: 32.5 % — ABNORMAL LOW (ref 36.0–46.0)
Hemoglobin: 9 g/dL — ABNORMAL LOW (ref 12.0–15.0)
MCH: 20.9 pg — ABNORMAL LOW (ref 26.0–34.0)
MCHC: 27.7 g/dL — ABNORMAL LOW (ref 30.0–36.0)
MCV: 75.6 fL — ABNORMAL LOW (ref 80.0–100.0)
Platelets: 321 10*3/uL (ref 150–400)
RBC: 4.3 MIL/uL (ref 3.87–5.11)
RDW: 23.8 % — ABNORMAL HIGH (ref 11.5–15.5)
WBC: 6.9 10*3/uL (ref 4.0–10.5)
nRBC: 0.9 % — ABNORMAL HIGH (ref 0.0–0.2)

## 2022-02-26 LAB — MAGNESIUM: Magnesium: 1.9 mg/dL (ref 1.7–2.4)

## 2022-02-26 LAB — BASIC METABOLIC PANEL
Anion gap: 9 (ref 5–15)
BUN: 35 mg/dL — ABNORMAL HIGH (ref 8–23)
CO2: 27 mmol/L (ref 22–32)
Calcium: 8.2 mg/dL — ABNORMAL LOW (ref 8.9–10.3)
Chloride: 104 mmol/L (ref 98–111)
Creatinine, Ser: 1.09 mg/dL — ABNORMAL HIGH (ref 0.44–1.00)
GFR, Estimated: 51 mL/min — ABNORMAL LOW (ref >60)
Glucose, Bld: 92 mg/dL (ref 70–99)
Potassium: 4.3 mmol/L (ref 3.5–5.1)
Sodium: 140 mmol/L (ref 135–145)

## 2022-02-26 MED ORDER — ONDANSETRON 4 MG PO TBDP: 4.0000 mg | ORAL_TABLET | Freq: Four times a day (QID) | ORAL | Status: DC | PRN

## 2022-02-26 MED ORDER — GLYCOPYRROLATE 0.2 MG/ML IJ SOLN: 0.2000 mg | INTRAMUSCULAR | Status: DC | PRN

## 2022-02-26 MED ORDER — HALOPERIDOL LACTATE 5 MG/ML IJ SOLN: 0.5000 mg | INTRAMUSCULAR | Status: DC | PRN

## 2022-02-26 MED ORDER — ONDANSETRON HCL 4 MG/2ML IJ SOLN: 4.0000 mg | Freq: Four times a day (QID) | INTRAMUSCULAR | Status: DC | PRN

## 2022-02-26 MED ORDER — HALOPERIDOL 0.5 MG PO TABS: 0.5000 mg | ORAL_TABLET | ORAL | Status: DC | PRN

## 2022-02-26 MED ORDER — ACETAMINOPHEN 325 MG PO TABS: 650.0000 mg | ORAL_TABLET | Freq: Four times a day (QID) | ORAL | Status: DC | PRN

## 2022-02-26 MED ORDER — POLYVINYL ALCOHOL 1.4 % OP SOLN: 1.0000 [drp] | Freq: Four times a day (QID) | OPHTHALMIC | Status: DC | PRN

## 2022-02-26 MED ORDER — BIOTENE DRY MOUTH MT LIQD: 15.0000 mL | OROMUCOSAL | Status: DC | PRN

## 2022-02-26 MED ORDER — ACETAMINOPHEN 650 MG RE SUPP: 650.0000 mg | Freq: Four times a day (QID) | RECTAL | Status: DC | PRN

## 2022-02-26 MED ORDER — HALOPERIDOL LACTATE 2 MG/ML PO CONC: 0.5000 mg | ORAL | Status: DC | PRN

## 2022-02-26 MED ORDER — GLYCOPYRROLATE 1 MG PO TABS: 1.0000 mg | ORAL_TABLET | ORAL | Status: DC | PRN

## 2022-02-26 NOTE — Consult Note (Signed)
Consultation Note Date: 02/26/2022   Patient Name: Marisa Bailey  DOB: 12/10/1940  MRN: [id number]  Age / Sex: 82 y.o., female  PCP: [name], MD Referring Physician: Rodena Goldmann, DO  Reason for Consultation: Establishing goals of care and Hospice Evaluation  HPI/Patient Profile: 82 y.o. 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  Patient and family not seeking further aggressive treatment or workup.  Clinical Assessment and Goals of Care: I have reviewed medical records including EPIC notes, labs and imaging, received report from RN, assessed the patient.  Marisa Bailey is resting quietly in bed surrounded by her loving family.  She is alert, oriented to self and place only.  She does have periods of confusion.  I am not sure that she can make her basic needs known.    Her nieces, Lawrence Marseilles and [place] along with Mrs. Wickwire brother Zacarias Pontes, are present at bedside.  We meet to discuss diagnosis prognosis, GOC, EOL wishes, disposition and options.  I introduced Palliative Medicine as specialized medical care for people living with serious illness. It focuses on providing relief from the symptoms and stress of a serious illness. The goal is to improve quality of life for both the patient and the family.  We discussed a brief life review of the patient.  Marisa Bailey never married.  She has no children.  Her nieces [name] and [name] are like her children.  She had been living independently until this illness.    We focused on their current illness.  I share my  understanding that patient and family are seeking hospice care.  [name] and [name] states that they feel that they cannot care for [name] at home, she is had a full life.  The natural disease trajectory and expectations at EOL were discussed.  Comfort care was considered in light of the patient's goals of care.  We talk about residential hospice.  Family states that they have known people who went to hospice.  We talk about what is and is not provided at residential hospice.  Family agrees no further BiPAP.  We talk about unburdening Mrs. Brisbon from painful blood sugar checks, treatments that are not changing what is happening.  We talk about the concept of "let nature take its course".  Provider choice offered.  Family is requesting hospice of [facility] for comfort and dignity at end-of-life.  Advanced directives, concepts specific to code status, artifical feeding and hydration, and rehospitalization were considered and discussed.  DNR verified, DNR/goldenrod form completed and placed on chart.   Discussed the importance of continued conversation with family and the medical providers regarding overall plan of care and treatment options, ensuring decisions are within the context of the  patient's values and GOCs. Questions and concerns were addressed. The family was encouraged to call with questions or concerns.  PMT will continue to support holistically.  Conference with attending, bedside nursing staff, transition of care team related to patient condition, needs, goals of care, disposition.    HCPOA   NEXT OF KIN -nieces Lawrence Marseilles and [place]    SUMMARY OF RECOMMENDATIONS   Requesting comfort and dignity at end-of-life.  Residential hospice with [place] Planning: DNR -DNR/goldenrod form completed and placed on chart.  Symptom Management:  End-of-life order set to be implemented  Palliative Prophylaxis:  Frequent Pain  Assessment, Oral Care, and Turn Reposition  Additional Recommendations (Limitations, Scope, Preferences): Full Comfort Care  Psycho-social/Spiritual:  Desire for further Chaplaincy support:no Additional Recommendations: Caregiving  Support/Resources and Education on Hospice  Prognosis:  < 2 weeks, prognosis discussed with permission.  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 The following aspects are pertinent.  Past Medical History:  Diagnosis Date   Abnormal CT scan, kidney 02/25/2022   Indeterminate 1cm hyperattenuating cortical lesion middle third left kidney   Abnormal finding on echocardiogram 02/25/2022   EF 55-60% ; Severely elevated Pulm artery systolic pressure   Abnormal imaging of intrathoracic organ 02/25/2022   Extensive Intrathoracic tumor obstructing Bilat Lower Lobes with SOB at rest/Oxygen dependent/active smoker   Anemia    Aspiration pneumonia ([place]) 11/03/2020   Bilateral pleural effusion    Blind right eye    Cataract, left    s/p extraction 8/23   CHF (congestive heart failure) (HCC)    Chronic heart failure with preserved ejection fraction (HCC)    Chronic respiratory failure with hypoxia ([place]) 02/25/2022   Admit [place]/BiPap required   COPD (chronic obstructive pulmonary disease) (HCC)    Bronchiectasis / Oxygen Dependent 4L [place]   DM (diabetes mellitus), type 2 ([place])    Frequent falls    Fx upper humerus-closed 08/17/2012   GIB (gastrointestinal  bleeding) 10/24/2021   HLD (hyperlipidemia)    Hypertension    Lymphadenopathy 02/25/2022   New mediastinal + hilar lymphadenopathy / 2.8 cm soft tissue density mass anterior mediastinum favoured to represent prevascular lymph node   Mediastinal abnormality 02/25/2022   Indeterminate 0.9cm irreg solid pulm nodule left apex   Noncompliance with medication regimen    Oxygen dependent    Home 4L [place]   PE (pulmonary thromboembolism) ([place]) 08/15/2021   PE with RVE by CTa / Xarelto   Pulmonary HTN ([place])    Tobacco dependence with current use    Vitamin D deficiency    Social History   Socioeconomic History   Marital status: Single    Spouse name: Not on file   Number of children: Not on file   Years of education: Not on file   Highest education level: Not on file  Occupational History   Not on file  Tobacco Use   Smoking status: Every Day    Packs/day: 1.00    Years: 18.00    Total pack  years: 18.00    Types: Cigarettes   Smokeless tobacco: Never  Vaping Use   Vaping Use: Never used  Substance and Sexual Activity   Alcohol use: Not Currently    Comment: rarely   Drug use: No   Sexual activity: Not Currently  Other Topics Concern   Not on file  Social History Narrative   Retired (from [employer]). No children, never been married. Has 8 siblings (5 are deceased).   Social Determinants of Health   Financial Resource Strain: Low Risk  (01/25/2022)   Overall Financial Resource Strain (CARDIA)    Difficulty of Paying Living Expenses: Not very hard  Food Insecurity: No Food Insecurity (02/25/2022)   Hunger Vital Sign    Worried About Running Out of Food in the Last Year: Never true    Ran Out of Food in the Last Year: Never true  Transportation Needs: No Transportation Needs (02/12/2022)   PRAPARE - Hydrologist (Medical): No    Lack of Transportation (Non-Medical): No  Physical Activity: Sufficiently Active (11/30/2020)   Exercise Vital Sign    Days  of Exercise per Week: 7 days    Minutes of Exercise per Session: 30 min  Stress: No Stress Concern Present (11/30/2020)   [place]    Feeling of Stress : Not at all  Social Connections: Moderately Isolated (11/30/2020)   Social Connection and Isolation Panel [NHANES]    Frequency of Communication with Friends and Family: More than three times a week    Frequency of Social Gatherings with Friends and Family: More than three times a week    Attends Religious Services: 1 to 4 times per year    Active Member of [employer] or Organizations: No    Attends Music therapist: Never    Marital Status: Never married   Family History  Problem Relation Age of Onset   Diabetes Mother    Scheduled Meds:  atorvastatin  20 mg Oral Daily   Chlorhexidine Gluconate Cloth  6 each Topical Daily   folic acid  1 mg Oral Daily   ipratropium-albuterol  3 mL Nebulization QID   pantoprazole  40 mg Oral Daily   rivaroxaban  20 mg Oral Q supper   sodium chloride flush  3 mL Intravenous Q12H   zinc sulfate  220 mg Oral Daily   Continuous Infusions:  sodium chloride     PRN Meds:.sodium chloride, acetaminophen **OR** acetaminophen, albuterol, ondansetron **OR** ondansetron (ZOFRAN) IV, sodium chloride flush Medications Prior to Admission:  Prior to Admission medications   Medication Sig Start Date End Date Taking? Authorizing Provider  pantoprazole (PROTONIX) 40 MG tablet Take 1 tablet (40 mg total) by mouth daily. 01/14/22  Yes Emokpae, Courage, MD  rivaroxaban (XARELTO) 20 MG TABS tablet Take 1 tablet (20 mg total) by mouth daily with supper. 01/14/22  Yes Roxan Hockey, MD  ACCU-CHEK GUIDE test strip SMARTSIG:Via Meter 1-4 Times Daily 01/14/22   [provider]  Accu-Chek Softclix Lancets lancets SMARTSIG:Via Meter 1-4 Times Daily 01/14/22   [provider]  albuterol (PROVENTIL) (2.5 MG/3ML) 0.083% nebulizer  solution Take 3 mLs (2.5 mg total) by nebulization every 2 (two) hours as needed for wheezing or shortness of breath. 01/14/22   Roxan Hockey, MD  albuterol (VENTOLIN HFA) 108 (90 Base) MCG/ACT inhaler Inhale 2 puffs into the lungs every 6 (six) hours as needed for wheezing or shortness of breath. 01/14/22  Roxan Hockey, MD  amLODipine (NORVASC) 5 MG tablet Take 1 tablet (5 mg total) by mouth daily. 01/14/22   Roxan Hockey, MD  atorvastatin (LIPITOR) 20 MG tablet Take 1 tablet (20 mg total) by mouth daily. 01/14/22   Roxan Hockey, MD  blood glucose meter kit and supplies KIT Dispense based on patient and insurance preference. Use up to four times daily as directed. 01/14/22   Roxan Hockey, MD  blood glucose meter kit and supplies Dispense based on patient and insurance preference. Use up to four times daily as directed. (FOR ICD-9 250.00, 250.01). 01/14/22   Roxan Hockey, MD  Cholecalciferol (VITAMIN D3) 25 MCG (1000 UT) CAPS Take 1 capsule by mouth daily.    [provider]  folic acid (FOLVITE) 1 MG tablet Take 1 tablet (1 mg total) by mouth daily. 01/14/22   Roxan Hockey, MD  furosemide (LASIX) 20 MG tablet Take 1 tablet (20 mg total) by mouth 2 (two) times daily. 01/14/22 01/14/23  Roxan Hockey, MD  ipratropium-albuterol (DUONEB) 0.5-2.5 (3) MG/3ML SOLN Take 3 mLs by nebulization every 4 (four) hours as needed. 01/14/22   Emokpae, Courage, MD  KLOR-CON M20 20 MEQ tablet Take 20 mEq by mouth daily. 01/14/22   [provider]  metFORMIN (GLUCOPHAGE) 500 MG tablet Take 1 tablet (500 mg total) by mouth 2 (two) times daily. 01/14/22   Roxan Hockey, MD  Nebulizers (COMPRESSOR/NEBULIZER) MISC 1 Units by Does not apply route daily as needed. 01/14/22   Roxan Hockey, MD  zinc sulfate 220 (50 Zn) MG capsule Take 1 capsule (220 mg total) by mouth daily. 01/15/22   Roxan Hockey, MD   Allergies  Allergen Reactions   Penicillins Rash    Has patient had a PCN  reaction causing immediate rash, facial/tongue/throat swelling, SOB or lightheadedness with hypotension: {Yes/No:[id number] Has patient had a PCN reaction causing severe rash involving mucus membranes or skin necrosis: Yes Has patient had a PCN reaction that required hospitalization No Has patient had a PCN reaction occurring within the last 10 years: No If all of the above answers are "NO", then may proceed with Cephalosporin use.  **Has tolerated keflex    Review of Systems  Unable to perform ROS: Acuity of condition    Physical Exam Vitals and nursing note reviewed.  Constitutional:      General: She is not in acute distress.    Appearance: She is ill-appearing.  Pulmonary:     Effort: Pulmonary effort is normal. No tachypnea.  Skin:    General: Skin is warm and dry.     Vital Signs: BP 92/80   Pulse 91   Temp 97.9 F (36.6 C) (Oral)   Resp (!) 22   Ht _0  (1.626 m)   Wt 56.8 kg   SpO2 95%   BMI 21.49 kg/m  Pain Scale: 0-10   Pain Score: Asleep   SpO2: SpO2: 95 % O2 Device:SpO2: 95 % O2 Flow Rate: .O2 Flow Rate (L/min): 10 L/min  IO: Intake/output summary:  Intake/Output Summary (Last 24 hours) at 02/26/2022 1325 Last data filed at 02/26/2022 0400 Gross per 24 hour  Intake 120 ml  Output 925 ml  Net -805 ml    LBM: Last BM Date : 02/17/2022 Baseline Weight: Weight: 59 kg Most recent weight: Weight: 56.8 kg     Palliative Assessment/Data:     Time In: 0910 Time Out: 1025 Time Total: 75 minutes  Greater than 50%  of this time was spent counseling and  coordinating care related to the above assessment and plan.  Signed by: Drue Novel, NP   Please contact Palliative Medicine Team phone at [phone] for questions and concerns.  For individual provider: See Shea Evans

## 2022-02-26 NOTE — TOC Initial Note (Addendum)
Transition of Care [facility]) - Initial/Assessment Note    Patient Details  Name: Marisa Bailey MRN: [id number] Date of Birth: 02/03/1941  [facility] CM/SW Contact:    Iona Beard, [place] Phone Number: 02/26/2022, 11:42 AM  Clinical Narrative:                 CSW updated by MD and Palliative NP Aniceto Boss that pt and family requesting residential hospice placement with [facility] CSW spoke to [place] with hospice who states they will review and work on referral. TOC to follow.   Addendum 1:15: CSW updated by [employer] with [facility] that hospice RN will arrive to assess pt between 2-2:30. CSW to update treatment team.   Addendum 4:05pm: CSW updated by [facility] with Hospice that they are able to admit pt to a virtual bed at this time. CSW updated MD that chart can be flipped to GIP. [facility] needs pt to wean down O2 to 8-9L before they are able to get pt to [facility].   Expected Discharge Plan: [place] Barriers to Discharge: Continued Medical Work up   Patient Goals and CMS Choice Patient states their goals for this hospitalization and ongoing recovery are:: hospice placement CMS Medicare.gov Compare Post Acute Care list provided to:: Patient Choice offered to / list presented to : Patient      Expected Discharge Plan and Services In-house Referral: Clinical Social Work Discharge Planning Services: CM Consult Post Acute Care Choice: Hospice Living arrangements for the past 2 months: Single Family Home                                      Prior Living Arrangements/Services Living arrangements for the past 2 months: Single Family Home Lives with:: Self Patient language and need for interpreter reviewed:: Yes Do you feel safe going back to the place where you live?: Yes      Need for Family Participation in Patient Care: Yes (Comment) Care giver support system in place?: Yes (comment)   Criminal Activity/Legal Involvement Pertinent to  Current Situation/Hospitalization: No - Comment as needed  Activities of Daily Living Home Assistive Devices/Equipment: None ADL Screening (condition at time of admission) Patient's cognitive ability adequate to safely complete daily activities?: Yes Is the patient deaf or have difficulty hearing?: Yes Does the patient have difficulty seeing, even when wearing glasses/contacts?: No Does the patient have difficulty concentrating, remembering, or making decisions?: No Patient able to express need for assistance with ADLs?: Yes Does the patient have difficulty dressing or bathing?: No Independently performs ADLs?: Yes (appropriate for developmental age) Does the patient have difficulty walking or climbing stairs?: Yes Weakness of Legs: Both Weakness of Arms/Hands: None  Permission Sought/Granted                  Emotional Assessment Appearance:: Appears stated age Attitude/Demeanor/Rapport: Engaged Affect (typically observed): Accepting Orientation: : Oriented to Self, Oriented to Place, Oriented to  Time, Oriented to Situation Alcohol / Substance Use: Not Applicable Psych Involvement: No (comment)  Admission diagnosis:  Acute diastolic CHF (congestive heart failure) (HCC) [I50.31] Acute hypoxemic respiratory failure (HCC) [J96.01] Patient Active Problem List   Diagnosis Date Noted   Acute hypoxemic respiratory failure ([place]) 59/29/2446   Acute diastolic CHF (congestive heart failure) ([place]) 02/17/2022   Fall 01/24/2022   Acute exacerbation of CHF (congestive heart failure) ([place]) 01/09/2022   Bronchiectasis with  acute lower respiratory infection ([place]) 11/30/2021   Refused influenza vaccine 10/30/2021   Hospital discharge follow-up 10/30/2021   High risk medication use    GI bleed 10/24/2021   Encounter for general adult medical examination with abnormal findings 09/11/2021   Pulmonary emboli ([place]) 08/15/2021   Symptomatic anemia 08/15/2021   Pulmonary hypertension due to  COPD ([place]) 11/10/2020   Blindness of right eye 11/10/2020   Prediabetes 11/10/2020   HLD (hyperlipidemia) 11/10/2020   (HFpEF) heart failure with preserved ejection fraction ([place]) 11/03/2020   Chronic respiratory failure with hypoxia ([place]) 01/11/2019   Leg swelling    COPD (chronic obstructive pulmonary disease) ([place]) 06/27/2017   Tobacco abuse 06/27/2017   Essential hypertension    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, left eye 08/27/2016   Muscle weakness (generalized) 08/17/2012   PCP:  [name], MD Pharmacy:   [place], [place] - [address] [place] [address] Phone: [phone] Fax: [phone]  [place], [address] [place] [address] Phone: [phone] Fax: [phone]     Social Determinants of Health (SDOH) Social History: SDOH Screenings   Food Insecurity: No Food Insecurity (03/08/2022)  Housing: Low Risk  (02/16/2022)  Transportation Needs: No Transportation Needs (03/13/2022)  Utilities: Not At Risk (02/28/2022)  Alcohol Screen: Low Risk  (11/30/2020)  Depression (PHQ2-9): Low Risk  (12/13/2021)  Financial Resource Strain: Low Risk  (01/25/2022)  Physical Activity: Sufficiently Active (11/30/2020)  Social Connections: Moderately Isolated (11/30/2020)  Stress: No Stress Concern Present (11/30/2020)  Tobacco Use: High Risk (02/26/2022)   SDOH Interventions:     Readmission Risk Interventions    02/26/2022   11:38 AM 01/10/2022   10:57 AM  Readmission Risk Prevention Plan  Transportation Screening Complete Complete  PCP or Specialist Appt within 3-5 Days  Not Complete  HRI or [place]  Complete  Social Work Consult for [place] Planning/Counseling  Complete  Palliative Care Screening  Not Applicable  Medication Review Press photographer) Complete Complete  HRI or [place] Complete   SW Recovery  Care/Counseling Consult Complete   Palliative Care Screening Complete   [place] Not Applicable

## 2022-02-26 NOTE — Progress Notes (Signed)
PROGRESS NOTE    Marisa Bailey  FQB:[id number] DOB: 1940-08-17 DOA: 03/05/2022 PCP: [name], MD   Brief Narrative:  Marisa Bailey is a 82 y.o. 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  She was admitted for acute diastolic CHF exacerbation with acute on chronic hypoxemic respiratory failure.  This worsened into the evening of her admission with worsening tachypnea and hypoxemia and she required transfer to ICU with need for BiPAP and further diuresis.  2D echocardiogram ordered and pending.  Pulmonology consulted for assistance in management, and it appears that patient has a large lung mass that is affecting her airways.  Patient would like conservative management and is now DNR.  She appears agreeable to hospice facility and palliative consulted to assist with management.  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 along with right-sided effusion Patient would not want further aggressive treatment  BNP 497 on admission and chest x-ray with mild right-sided pleural effusion noted Prior 2D echocardiogram 08/2021 with EF 55-60% and grade 2 diastolic dysfunction with moderate pulmonary hypertension Patient now off of BiPAP and on high flow nasal  cannula, okay for transfer to telemetry She is DNR after discussion with niece 1/15 Appreciate palliative consultation and appears stable for transfer to hospice facility    History of COPD with chronic hypoxemia Wears 4 L nasal cannula at home, currently on 11 L nasal cannula oxygen No acute bronchospasms noted DuoNebs as needed for shortness of breath or wheezing Currently on BiPAP without pCO2 elevation   Chronic anemia Prior history of GI bleed Continue Protonix No overt bleeding noted Continue to monitor   History of prior PE Continue Xarelto   Hypertension Hold home amlodipine given softer blood pressure readings while on aggressive IV diuresis   Prior tobacco abuse Denies current tobacco abuse    DVT prophylaxis: Xarelto Code Status: DNR/DNI Family Communication: Niece at bedside 1/16 Disposition Plan:  Status is: Inpatient Remains inpatient appropriate because: Need for close monitoring and IV medications   Consultants:  Pulmonology Palliative  Procedures:  None  Antimicrobials:  None   Subjective: Patient seen and evaluated today with no new acute complaints or concerns. No acute concerns or events noted overnight.  She is currently off of BiPAP.  Objective: Vitals:   02/26/22 0748 02/26/22 0754 02/26/22 0800 02/26/22 0900  BP: (!) 137/58  (!) 140/59 (!) 131/45  Pulse: 81  86 72  Resp: 17  (!) 30 (!) 23  Temp:      TempSrc:      SpO2: 95% 100% 100% 95%  Weight:      Height:        Intake/Output Summary (Last 24 hours) at 02/26/2022 1001 Last data filed at 02/26/2022 0400 Gross per 24 hour  Intake 120 ml  Output 925 ml  Net -805 ml   Filed Weights   03/10/2022 0920 02/25/22 0555 02/26/22 0341  Weight: 59 kg 55.9 kg 56.8 kg    Examination:  General exam: Appears calm and comfortable  Respiratory system: Diminished bilaterally, 11 L nasal cannula Cardiovascular system: S1 & S2 heard, RRR.  Gastrointestinal system: Abdomen is  soft Central nervous system: Somnolent Extremities: No edema Skin: No significant lesions noted Psychiatry: Flat affect.    Data Reviewed: I have personally reviewed following labs and imaging studies  CBC: Recent Labs  Lab 03/10/2022 0948 02/25/22 0449 02/26/22 0330  WBC 6.0 6.7 6.9  HGB 9.8* 8.6* 9.0*  HCT 34.4* 31.0* 32.5*  MCV 75.9* 76.2* 75.6*  PLT 382 311 111   Basic Metabolic Panel: Recent Labs  Lab 02/16/2022 0948 03/03/2022 1105 02/25/22 0449 02/26/22 0330  NA 135  --  139 140  K 6.0* 6.0* 4.7 4.3  CL 104  --  105 104  CO2 22  --  24 27  GLUCOSE 118*  --  91 92  BUN 44*  --  38* 35*  CREATININE 1.61*  --  1.29* 1.09*  CALCIUM 8.5*  --  8.1* 8.2*  MG  --   --  2.0 1.9   GFR: Estimated Creatinine Clearance: 35 mL/min (A) (by C-G formula based on SCr of 1.09 mg/dL (H)). Liver Function Tests: Recent Labs  Lab 03/13/2022 0948  AST 16  ALT 11  ALKPHOS 92  BILITOT 0.5  PROT 7.5  ALBUMIN 3.2*   No results for input(s): "LIPASE", "AMYLASE" in the last 168 hours. No results for input(s): "AMMONIA" in the last 168 hours. Coagulation Profile: No results for input(s): "INR", "PROTIME" in the last 168 hours. Cardiac Enzymes: No results for input(s): "CKTOTAL", "CKMB", "CKMBINDEX", "TROPONINI" in the last 168 hours. BNP (last 3 results) No results for input(s): "PROBNP" in the last 8760 hours. HbA1C: No results for input(s): "HGBA1C" in the last 72 hours. CBG: No results for input(s): "GLUCAP" in the last 168 hours. Lipid Profile: No results for input(s): "CHOL", "HDL", "LDLCALC", "TRIG", "CHOLHDL", "LDLDIRECT" in the last 72 hours. Thyroid Function Tests: No results for input(s): "TSH", "T4TOTAL", "FREET4", "T3FREE", "THYROIDAB" in the last 72 hours. Anemia Panel: No results for input(s): "VITAMINB12", "FOLATE", "FERRITIN", "TIBC", "IRON", "RETICCTPCT" in the last 72 hours. Sepsis Labs: No results for input(s): "PROCALCITON", "LATICACIDVEN" in the last 168  hours.  Recent Results (from the past 240 hour(s))  Resp panel by RT-PCR (RSV, Flu A&B, Covid) Anterior Nasal Swab     Status: None   Collection Time: 03/12/2022 10:34 AM   Specimen: Anterior Nasal Swab  Result Value Ref Range Status   SARS Coronavirus 2 by RT PCR NEGATIVE NEGATIVE Final    Comment: (NOTE) SARS-CoV-2 target nucleic acids are NOT DETECTED.  The SARS-CoV-2 RNA is generally detectable in upper respiratory specimens during the acute phase of infection. The lowest concentration of SARS-CoV-2 viral copies this assay can detect is 138 copies/mL. A negative result does not preclude SARS-Cov-2 infection and should not be used as the sole basis for treatment or other patient management decisions. A negative result may occur with  improper specimen collection/handling, submission of specimen other than nasopharyngeal swab, presence of viral mutation(s) within the areas targeted by this assay, and inadequate number of viral copies(<138 copies/mL). A negative result must be combined with clinical observations, patient history, and epidemiological information. The expected result is Negative.  Fact Sheet for Patients:  EntrepreneurPulse.com.au  Fact  Sheet for Healthcare Providers:  IncredibleEmployment.be  This test is no t yet approved or cleared by the [place] FDA and  has been authorized for detection and/or diagnosis of SARS-CoV-2 by FDA under an Emergency Use Authorization (EUA). This EUA will remain  in effect (meaning this test can be used) for the duration of the COVID-19 declaration under Section 564(b)(1) of the Act, 21 U.S.C.section 360bbb-3(b)(1), unless the authorization is terminated  or revoked sooner.       Influenza A by PCR NEGATIVE NEGATIVE Final   Influenza B by PCR NEGATIVE NEGATIVE Final    Comment: (NOTE) The Xpert Xpress SARS-CoV-2/FLU/RSV plus assay is intended as an aid in the diagnosis of influenza from  Nasopharyngeal swab specimens and should not be used as a sole basis for treatment. Nasal washings and aspirates are unacceptable for Xpert Xpress SARS-CoV-2/FLU/RSV testing.  Fact Sheet for Patients: EntrepreneurPulse.com.au  Fact Sheet for Healthcare Providers: IncredibleEmployment.be  This test is not yet approved or cleared by the [place] FDA and has been authorized for detection and/or diagnosis of SARS-CoV-2 by FDA under an Emergency Use Authorization (EUA). This EUA will remain in effect (meaning this test can be used) for the duration of the COVID-19 declaration under Section 564(b)(1) of the Act, 21 U.S.C. section 360bbb-3(b)(1), unless the authorization is terminated or revoked.     Resp Syncytial Virus by PCR NEGATIVE NEGATIVE Final    Comment: (NOTE) Fact Sheet for Patients: EntrepreneurPulse.com.au  Fact Sheet for Healthcare Providers: IncredibleEmployment.be  This test is not yet approved or cleared by the [place] FDA and has been authorized for detection and/or diagnosis of SARS-CoV-2 by FDA under an Emergency Use Authorization (EUA). This EUA will remain in effect (meaning this test can be used) for the duration of the COVID-19 declaration under Section 564(b)(1) of the Act, 21 U.S.C. section 360bbb-3(b)(1), unless the authorization is terminated or revoked.  Performed at [facility], [address], [place], [place] [address]   MRSA Next Gen by PCR, Nasal     Status: None   Collection Time: 02/25/22  6:48 AM   Specimen: Nasal Mucosa; Nasal Swab  Result Value Ref Range Status   MRSA by PCR Next Gen NOT DETECTED NOT DETECTED Final    Comment: (NOTE) The GeneXpert MRSA Assay (FDA approved for NASAL specimens only), is one component of a comprehensive MRSA colonization surveillance program. It is not intended to diagnose MRSA infection nor to guide or monitor treatment for  MRSA infections. Test performance is not FDA approved in patients less than 8 years old. Performed at [facility], [address], [place], [place] [address]          Radiology Studies: [place] CHEST (PLEURAL EFFUSION)  Result Date: 02/25/2022 CLINICAL DATA:  RIGHT pleural effusion EXAM: CHEST ULTRASOUND COMPARISON:  Chest radiograph 02/25/2022 FINDINGS: Small RIGHT pleural effusion, less than expected from chest radiograph. Extensive consolidation versus atelectasis of RIGHT lower lobe. IMPRESSION: Small RIGHT pleural effusion with atelectasis versus consolidation of LEFT lower lobe likely accounting for increased RIGHT basilar opacity on chest radiography. Thoracentesis deferred at this time; recommend follow-up chest radiographs to assess for progression or clearance. Electronically Signed   By: Lavonia Dana M.D.   On: 02/25/2022 15:17   ECHOCARDIOGRAM COMPLETE  Result Date: 02/25/2022    ECHOCARDIOGRAM REPORT   Patient Name:   ROSAN CALBERT Jarriel Date of Exam: 02/25/2022 Medical Rec #:  [id number]      Height:       64.0  in Accession #:    [id number]     Weight:       123.2 lb Date of Birth:  Jul 25, 1940       BSA:          1.592 m Patient Age:    36 years       BP:           138/53 mmHg Patient Gender: F              HR:           79 bpm. Exam Location:  [facility] Procedure: 2D Echo, Cardiac Doppler and Color Doppler Indications:    CHF-Acute Systolic A57.90  History:        Patient has prior history of Echocardiogram examinations, most                 recent 08/16/2021. CHF, Pulmonary HTN, Signs/Symptoms:Shortness of                 Breath; Risk Factors:Hypertension, Current Smoker and Diabetes.  Sonographer:    Greer Pickerel Referring Phys: [id number] OLADAPO ADEFESO  Sonographer Comments: Technically challenging study due to limited acoustic windows. Image acquisition challenging due to uncooperative patient, Image acquisition challenging due to COPD, Image acquisition challenging due to respiratory motion and  Image acquisition challenging due to patient body habitus. IMPRESSIONS  1. Left ventricular ejection fraction, by estimation, is 55 to 60%. The left ventricle has normal function. The left ventricle has no regional wall motion abnormalities. Left ventricular diastolic parameters are indeterminate. There is the interventricular septum is flattened in systole and diastole, consistent with right ventricular pressure and volume overload.  2. Right ventricular systolic function is mildly reduced. The right ventricular size is mildly enlarged. There is severely elevated pulmonary artery systolic pressure. The estimated right ventricular systolic pressure is 29.1 mmHg.  3. Left atrial size was mild to moderately dilated.  4. Right atrial size was mild to moderately dilated.  5. The mitral valve is degenerative. Trivial mitral valve regurgitation. No evidence of mitral stenosis.  6. The aortic valve is tricuspid. Aortic valve regurgitation is not visualized. No aortic stenosis is present.  7. The inferior vena cava is dilated in size with <50% respiratory variability, suggesting right atrial pressure of 15 mmHg. Comparison(s): No significant change from prior study. FINDINGS  Left Ventricle: Left ventricular ejection fraction, by estimation, is 55 to 60%. The left ventricle has normal function. The left ventricle has no regional wall motion abnormalities. The left ventricular internal cavity size was normal in size. There is  no left ventricular hypertrophy. The interventricular septum is flattened in systole and diastole, consistent with right ventricular pressure and volume overload. Left ventricular diastolic parameters are indeterminate. Right Ventricle: The right ventricular size is mildly enlarged. Right vetricular wall thickness was not well visualized. Right ventricular systolic function is mildly reduced. There is severely elevated pulmonary artery systolic pressure. The tricuspid regurgitant velocity is 3.58 m/s,  and with an assumed right atrial pressure of 15 mmHg, the estimated right ventricular systolic pressure is 91.6 mmHg. Left Atrium: Left atrial size was mild to moderately dilated. Right Atrium: Right atrial size was mild to moderately dilated. Pericardium: There is no evidence of pericardial effusion. Mitral Valve: The mitral valve is degenerative in appearance. Trivial mitral valve regurgitation. No evidence of mitral valve stenosis. Tricuspid Valve: The tricuspid valve is grossly normal. Tricuspid valve regurgitation is trivial. No evidence of tricuspid stenosis. Aortic Valve: The aortic valve  is tricuspid. Aortic valve regurgitation is not visualized. No aortic stenosis is present. Pulmonic Valve: The pulmonic valve was normal in structure. Pulmonic valve regurgitation is not visualized. No evidence of pulmonic stenosis. Aorta: The aortic root and ascending aorta are structurally normal, with no evidence of dilitation. Venous: The inferior vena cava is dilated in size with less than 50% respiratory variability, suggesting right atrial pressure of 15 mmHg. IAS/Shunts: The interatrial septum was not well visualized.  LEFT VENTRICLE PLAX 2D LVIDd:         4.10 cm   Diastology LVIDs:         2.80 cm   LV e' medial:    4.91 cm/s LV PW:         1.00 cm   LV E/e' medial:  16.3 LV IVS:        0.80 cm   LV e' lateral:   8.64 cm/s LVOT diam:     1.70 cm   LV E/e' lateral: 9.2 LV SV:         34 LV SV Index:   21 LVOT Area:     2.27 cm  RIGHT VENTRICLE RV S prime:     10.20 cm/s TAPSE (M-mode): 1.8 cm LEFT ATRIUM             Index        RIGHT ATRIUM           Index LA diam:        3.20 cm 2.01 cm/m   RA Area:     19.90 cm LA Vol (A2C):   80.8 ml 50.74 ml/m  RA Volume:   53.50 ml  33.60 ml/m LA Vol (A4C):   53.7 ml 33.72 ml/m LA Biplane Vol: 68.0 ml 42.70 ml/m  AORTIC VALVE             PULMONIC VALVE LVOT Vmax:   75.50 cm/s  PR End Diast Vel: 7.51 msec LVOT Vmean:  48.100 cm/s LVOT VTI:    0.148 m  AORTA Ao Root diam:  3.50 cm MITRAL VALVE               TRICUSPID VALVE MV Area (PHT): 4.68 cm    TR Peak grad:   51.3 mmHg MV Decel Time: 162 msec    TR Vmax:        358.00 cm/s MV E velocity: 79.90 cm/s MV A velocity: 81.50 cm/s  SHUNTS MV E/A ratio:  0.98        Systemic VTI:  0.15 m                            Systemic Diam: 1.70 cm Vishnu Priya Mallipeddi Electronically signed by Lorelee Cover Mallipeddi Signature Date/Time: 02/25/2022/1:55:16 PM    Final    DG Chest Port 1 View  Result Date: 02/25/2022 CLINICAL DATA:  Shortness of breath. EXAM: PORTABLE CHEST 1 VIEW COMPARISON:  03/05/2022 FINDINGS: Heart size is accentuated by technique. There is increased bibasilar opacity, obscuring both hemidiaphragms. Suspect significant RIGHT pleural effusion addition to atelectasis or infiltrate. IMPRESSION: Increased bibasilar opacities and pleural effusion. Electronically Signed   By: Nolon Nations M.D.   On: 02/25/2022 06:51        Scheduled Meds:  atorvastatin  20 mg Oral Daily   Chlorhexidine Gluconate Cloth  6 each Topical Daily   folic acid  1 mg Oral Daily   ipratropium-albuterol  3 mL Nebulization QID  pantoprazole  40 mg Oral Daily   rivaroxaban  20 mg Oral Q supper   sodium chloride flush  3 mL Intravenous Q12H   zinc sulfate  220 mg Oral Daily   Continuous Infusions:  sodium chloride       LOS: 1 day    Time spent: 35 minutes    Anastyn Ayars Darleen Crocker, DO [facility]  If 7PM-7AM, please contact night-coverage [email or web address] 02/26/2022, 10:01 AM

## 2022-02-27 DIAGNOSIS — E119 Type 2 diabetes mellitus without complications: Secondary | ICD-10-CM | POA: Diagnosis not present

## 2022-02-27 DIAGNOSIS — J449 Chronic obstructive pulmonary disease, unspecified: Secondary | ICD-10-CM | POA: Diagnosis not present

## 2022-02-27 DIAGNOSIS — Z7984 Long term (current) use of oral hypoglycemic drugs: Secondary | ICD-10-CM | POA: Diagnosis not present

## 2022-02-27 DIAGNOSIS — Z86711 Personal history of pulmonary embolism: Secondary | ICD-10-CM | POA: Diagnosis not present

## 2022-02-27 DIAGNOSIS — M6281 Muscle weakness (generalized): Secondary | ICD-10-CM | POA: Diagnosis not present

## 2022-02-27 DIAGNOSIS — I5031 Acute diastolic (congestive) heart failure: Secondary | ICD-10-CM | POA: Diagnosis not present

## 2022-02-27 DIAGNOSIS — D649 Anemia, unspecified: Secondary | ICD-10-CM | POA: Diagnosis not present

## 2022-02-27 DIAGNOSIS — F17210 Nicotine dependence, cigarettes, uncomplicated: Secondary | ICD-10-CM | POA: Diagnosis not present

## 2022-02-27 DIAGNOSIS — I5033 Acute on chronic diastolic (congestive) heart failure: Secondary | ICD-10-CM | POA: Diagnosis not present

## 2022-02-27 DIAGNOSIS — J9621 Acute and chronic respiratory failure with hypoxia: Secondary | ICD-10-CM | POA: Diagnosis not present

## 2022-02-27 DIAGNOSIS — I110 Hypertensive heart disease with heart failure: Secondary | ICD-10-CM | POA: Diagnosis not present

## 2022-02-27 MED ORDER — IPRATROPIUM-ALBUTEROL 0.5-2.5 (3) MG/3ML IN SOLN
3.0000 mL | Freq: Three times a day (TID) | RESPIRATORY_TRACT | Status: DC
  Filled 2022-02-27: qty 3

## 2022-03-06 ENCOUNTER — Ambulatory Visit: Payer: Medicare Other | Admitting: Gastroenterology

## 2022-03-14 ENCOUNTER — Other Ambulatory Visit: Payer: Self-pay | Admitting: Internal Medicine

## 2022-03-14 DIAGNOSIS — I10 Essential (primary) hypertension: Secondary | ICD-10-CM

## 2022-03-14 NOTE — Progress Notes (Signed)
[facility] called. Spoke with Bethena Roys. Family has been notified as well as on call provider this shift.

## 2022-03-14 NOTE — Death Summary Note (Signed)
DEATH SUMMARY   Patient Details  Name: Marisa Bailey MRN: [id number] DOB: [birth year] LSL:HTDSK, Colin Broach, MD Admission/Discharge Information   Admit Date:  Mar 09, 2022  Date of Death: Date of Death: Mar 12, 2022  Time of Death: Time of Death: 0610  Length of Stay: 2   Principle Cause of death: Acute on chronic hypoxemic respiratory failure in the setting of extensive intrathoracic tumor with obstruction  Hospital Diagnoses: Principal Problem:   Acute diastolic CHF (congestive heart failure) ([place]) Active Problems:   Pulmonary emboli (H[facility])   COPD (chronic obstructive pulmonary disease) ([place])   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  She was admitted for acute diastolic CHF exacerbation with acute on chronic hypoxemic respiratory failure.  This worsened into the evening of her admission with worsening tachypnea and hypoxemia and she required transfer to ICU with need for BiPAP and further diuresis.  2D echocardiogram unremarkable.  Pulmonology consulted for assistance in management, and CT from prior reviewed demonstrating large obstructing lung mass which was the likely culprit.  Patient would like conservative management and is now DNR.  She appears agreeable to hospice facility and palliative consulted to assist with management.  She was transitioned to comfort care 1/16 and subsequently expired on Mar 12, 2022 at 0610.  Procedures: None  Consultations: Pulmonology, palliative care  The results of significant diagnostics  from this hospitalization (including imaging, microbiology, ancillary and laboratory) are listed below for reference.   Significant Diagnostic Studies: [place] CHEST (PLEURAL EFFUSION)  Result Date: 02/25/2022 CLINICAL DATA:  RIGHT pleural effusion EXAM: CHEST ULTRASOUND COMPARISON:  Chest radiograph 02/25/2022 FINDINGS: Small RIGHT pleural effusion, less than expected from chest radiograph. Extensive consolidation versus atelectasis of RIGHT lower lobe. IMPRESSION: Small RIGHT pleural effusion with atelectasis versus consolidation of LEFT lower lobe likely accounting for increased RIGHT basilar opacity on chest radiography. Thoracentesis deferred at this time; recommend follow-up chest radiographs to assess for progression or clearance. Electronically Signed   By: Lavonia Dana M.D.   On: 02/25/2022 15:17   ECHOCARDIOGRAM COMPLETE  Result Date: 02/25/2022    ECHOCARDIOGRAM REPORT   Patient Name:   Marisa Bailey Date of Exam: 02/25/2022 Medical Rec #:  [id number]      Height:       64.0 in Accession #:    [id number]     Weight:       123.2 lb Date of Birth:  Oct 02, 1940       BSA:          1.592 m Patient Age:    66 years       BP:           138/53 mmHg Patient Gender: F              HR:           79 bpm. Exam Location:  [facility] Procedure: 2D Echo, Cardiac Doppler and Color Doppler Indications:    CHF-Acute Systolic U38.45  History:        Patient has prior  history of Echocardiogram examinations, most                 recent 08/16/2021. CHF, Pulmonary HTN, Signs/Symptoms:Shortness of                 Breath; Risk Factors:Hypertension, Current Smoker and Diabetes.  Sonographer:    Greer Pickerel Referring Phys: [id number] OLADAPO ADEFESO  Sonographer Comments: Technically challenging study due to limited acoustic windows. Image acquisition challenging due to uncooperative patient, Image acquisition challenging due to COPD, Image acquisition challenging due to respiratory motion and Image acquisition challenging due to  patient body habitus. IMPRESSIONS  1. Left ventricular ejection fraction, by estimation, is 55 to 60%. The left ventricle has normal function. The left ventricle has no regional wall motion abnormalities. Left ventricular diastolic parameters are indeterminate. There is the interventricular septum is flattened in systole and diastole, consistent with right ventricular pressure and volume overload.  2. Right ventricular systolic function is mildly reduced. The right ventricular size is mildly enlarged. There is severely elevated pulmonary artery systolic pressure. The estimated right ventricular systolic pressure is 52.1 mmHg.  3. Left atrial size was mild to moderately dilated.  4. Right atrial size was mild to moderately dilated.  5. The mitral valve is degenerative. Trivial mitral valve regurgitation. No evidence of mitral stenosis.  6. The aortic valve is tricuspid. Aortic valve regurgitation is not visualized. No aortic stenosis is present.  7. The inferior vena cava is dilated in size with <50% respiratory variability, suggesting right atrial pressure of 15 mmHg. Comparison(s): No significant change from prior study. FINDINGS  Left Ventricle: Left ventricular ejection fraction, by estimation, is 55 to 60%. The left ventricle has normal function. The left ventricle has no regional wall motion abnormalities. The left ventricular internal cavity size was normal in size. There is  no left ventricular hypertrophy. The interventricular septum is flattened in systole and diastole, consistent with right ventricular pressure and volume overload. Left ventricular diastolic parameters are indeterminate. Right Ventricle: The right ventricular size is mildly enlarged. Right vetricular wall thickness was not well visualized. Right ventricular systolic function is mildly reduced. There is severely elevated pulmonary artery systolic pressure. The tricuspid regurgitant velocity is 3.58 m/s, and with an assumed right atrial  pressure of 15 mmHg, the estimated right ventricular systolic pressure is 74.7 mmHg. Left Atrium: Left atrial size was mild to moderately dilated. Right Atrium: Right atrial size was mild to moderately dilated. Pericardium: There is no evidence of pericardial effusion. Mitral Valve: The mitral valve is degenerative in appearance. Trivial mitral valve regurgitation. No evidence of mitral valve stenosis. Tricuspid Valve: The tricuspid valve is grossly normal. Tricuspid valve regurgitation is trivial. No evidence of tricuspid stenosis. Aortic Valve: The aortic valve is tricuspid. Aortic valve regurgitation is not visualized. No aortic stenosis is present. Pulmonic Valve: The pulmonic valve was normal in structure. Pulmonic valve regurgitation is not visualized. No evidence of pulmonic stenosis. Aorta: The aortic root and ascending aorta are structurally normal, with no evidence of dilitation. Venous: The inferior vena cava is dilated in size with less than 50% respiratory variability, suggesting right atrial pressure of 15 mmHg. IAS/Shunts: The interatrial septum was not well visualized.  LEFT VENTRICLE PLAX 2D LVIDd:         4.10 cm   Diastology LVIDs:         2.80 cm   LV e' medial:    4.91 cm/s LV PW:         1.00 cm  LV E/e' medial:  16.3 LV IVS:        0.80 cm   LV e' lateral:   8.64 cm/s LVOT diam:     1.70 cm   LV E/e' lateral: 9.2 LV SV:         34 LV SV Index:   21 LVOT Area:     2.27 cm  RIGHT VENTRICLE RV S prime:     10.20 cm/s TAPSE (M-mode): 1.8 cm LEFT ATRIUM             Index        RIGHT ATRIUM           Index LA diam:        3.20 cm 2.01 cm/m   RA Area:     19.90 cm LA Vol (A2C):   80.8 ml 50.74 ml/m  RA Volume:   53.50 ml  33.60 ml/m LA Vol (A4C):   53.7 ml 33.72 ml/m LA Biplane Vol: 68.0 ml 42.70 ml/m  AORTIC VALVE             PULMONIC VALVE LVOT Vmax:   75.50 cm/s  PR End Diast Vel: 7.51 msec LVOT Vmean:  48.100 cm/s LVOT VTI:    0.148 m  AORTA Ao Root diam: 3.50 cm MITRAL VALVE                TRICUSPID VALVE MV Area (PHT): 4.68 cm    TR Peak grad:   51.3 mmHg MV Decel Time: 162 msec    TR Vmax:        358.00 cm/s MV E velocity: 79.90 cm/s MV A velocity: 81.50 cm/s  SHUNTS MV E/A ratio:  0.98        Systemic VTI:  0.15 m                            Systemic Diam: 1.70 cm Vishnu Priya Mallipeddi Electronically signed by Lorelee Cover Mallipeddi Signature Date/Time: 02/25/2022/1:55:16 PM    Final    DG Chest Port 1 View  Result Date: 02/25/2022 CLINICAL DATA:  Shortness of breath. EXAM: PORTABLE CHEST 1 VIEW COMPARISON:  02/17/2022 FINDINGS: Heart size is accentuated by technique. There is increased bibasilar opacity, obscuring both hemidiaphragms. Suspect significant RIGHT pleural effusion addition to atelectasis or infiltrate. IMPRESSION: Increased bibasilar opacities and pleural effusion. Electronically Signed   By: Nolon Nations M.D.   On: 02/25/2022 06:51   DG Chest Port 1 View  Result Date: 03/01/2022 CLINICAL DATA:  Shortness of breath EXAM: PORTABLE CHEST 1 VIEW COMPARISON:  01/27/2022 FINDINGS: Cardiomegaly. Aortic atherosclerosis. Chronic elevation of the right hemidiaphragm. Bibasilar atelectasis and consolidation. Small right pleural effusion. No pneumothorax. IMPRESSION: Bibasilar atelectasis and consolidation with small right pleural effusion. Electronically Signed   By: Davina Poke D.O.   On: 02/21/2022 09:57   CT Lumbar Spine Wo Contrast  Result Date: 02/23/2022 CLINICAL DATA:  Back pain for 1 week.  Unwitnessed fall EXAM: CT LUMBAR SPINE WITHOUT CONTRAST TECHNIQUE: Multidetector CT imaging of the lumbar spine was performed without intravenous contrast administration. Multiplanar CT image reconstructions were also generated. RADIATION DOSE REDUCTION: This exam was performed according to the departmental dose-optimization program which includes automated exposure control, adjustment of the mA and/or kV according to patient size and/or use of iterative reconstruction  technique. COMPARISON:  X-ray 01/27/2022, CT 11/23/2019.  Ultrasound 09/18/2020 FINDINGS: Segmentation: 5 lumbar type vertebrae. Alignment: Normal. Vertebrae: No acute fracture or focal  pathologic process. Paraspinal and other soft tissues: Partially imaged bilateral pleural effusions. Aortic atherosclerosis. Right renal cyst. The distal indeterminate density bilateral renal lesions are not appreciably changed since 2021 and were better characterized by ultrasound in 2022. No follow-up imaging is required. Advanced aortoiliac atherosclerosis. Disc levels: Mild disc height loss at L2-L3 and L3-L4. Relatively mild lower lumbar facet arthropathy. Relatively mild foraminal narrowing is most notable at the L3-4 level on the left. No evidence of high-grade canal stenosis by CT. IMPRESSION: 1. No acute fracture or traumatic malalignment of the lumbar spine. 2. 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 3. Partially imaged bilateral pleural effusions. 4. Aortic atherosclerosis (ICD10-I70.0). Electronically Signed   By: Davina Poke D.O.   On: 02/23/2022 17:09    Microbiology: Recent Results (from the past 240 hour(s))  Resp panel by RT-PCR (RSV, Flu A&B, Covid) Anterior Nasal Swab     Status: None   Collection Time: 03/13/2022 10:34 AM   Specimen: Anterior Nasal Swab  Result Value Ref Range Status   SARS Coronavirus 2 by RT PCR NEGATIVE NEGATIVE Final    Comment: (NOTE) SARS-CoV-2 target nucleic acids are NOT DETECTED.  The SARS-CoV-2 RNA is generally detectable in upper respiratory specimens during the acute phase of infection. The lowest concentration of SARS-CoV-2 viral copies this assay can detect is 138 copies/mL. A negative result does not preclude SARS-Cov-2 infection and should not be used as the sole basis for treatment or other patient management decisions. A negative result may occur with  improper specimen collection/handling, submission of specimen other than nasopharyngeal swab,  presence of viral mutation(s) within the areas targeted by this assay, and inadequate number of viral copies(<138 copies/mL). A negative result must be combined with clinical observations, patient history, and epidemiological information. The expected result is Negative.  Fact Sheet for Patients:  EntrepreneurPulse.com.au  Fact Sheet for Healthcare Providers:  IncredibleEmployment.be  This test is no t yet approved or cleared by the [place] FDA and  has been authorized for detection and/or diagnosis of SARS-CoV-2 by FDA under an Emergency Use Authorization (EUA). This EUA will remain  in effect (meaning this test can be used) for the duration of the COVID-19 declaration under Section 564(b)(1) of the Act, 21 U.S.C.section 360bbb-3(b)(1), unless the authorization is terminated  or revoked sooner.       Influenza A by PCR NEGATIVE NEGATIVE Final   Influenza B by PCR NEGATIVE NEGATIVE Final    Comment: (NOTE) The Xpert Xpress SARS-CoV-2/FLU/RSV plus assay is intended as an aid in the diagnosis of influenza from Nasopharyngeal swab specimens and should not be used as a sole basis for treatment. Nasal washings and aspirates are unacceptable for Xpert Xpress SARS-CoV-2/FLU/RSV testing.  Fact Sheet for Patients: EntrepreneurPulse.com.au  Fact Sheet for Healthcare Providers: IncredibleEmployment.be  This test is not yet approved or cleared by the [place] FDA and has been authorized for detection and/or diagnosis of SARS-CoV-2 by FDA under an Emergency Use Authorization (EUA). This EUA will remain in effect (meaning this test can be used) for the duration of the COVID-19 declaration under Section 564(b)(1) of the Act, 21 U.S.C. section 360bbb-3(b)(1), unless the authorization is terminated or revoked.     Resp Syncytial Virus by PCR NEGATIVE NEGATIVE Final    Comment: (NOTE) Fact Sheet for  Patients: EntrepreneurPulse.com.au  Fact Sheet for Healthcare Providers: IncredibleEmployment.be  This test is not yet approved or cleared by the [place] FDA and has been authorized for detection and/or diagnosis of SARS-CoV-2  by FDA under an Emergency Use Authorization (EUA). This EUA will remain in effect (meaning this test can be used) for the duration of the COVID-19 declaration under Section 564(b)(1) of the Act, 21 U.S.C. section 360bbb-3(b)(1), unless the authorization is terminated or revoked.  Performed at [facility], [address], [place], [place] [address]   MRSA Next Gen by PCR, Nasal     Status: None   Collection Time: 02/25/22  6:48 AM   Specimen: Nasal Mucosa; Nasal Swab  Result Value Ref Range Status   MRSA by PCR Next Gen NOT DETECTED NOT DETECTED Final    Comment: (NOTE) The GeneXpert MRSA Assay (FDA approved for NASAL specimens only), is one component of a comprehensive MRSA colonization surveillance program. It is not intended to diagnose MRSA infection nor to guide or monitor treatment for MRSA infections. Test performance is not FDA approved in patients less than 82 years old. Performed at [facility], [address] [place], [place] [address]     Time spent: 30 minutes  Signed: Rodena Goldmann, DO March 14, 2022

## 2022-03-14 DEATH — deceased

## 2022-05-27 ENCOUNTER — Ambulatory Visit: Payer: Medicare Other | Admitting: Internal Medicine
